# Patient Record
Sex: Female | Born: 1960 | Hispanic: Refuse to answer | Marital: Married | Smoking: Never smoker
Health system: Southern US, Community
[De-identification: ages and names within clinical notes are randomized; demographics above are authoritative.]

## PROBLEM LIST (undated history)

## (undated) ENCOUNTER — Emergency Department (HOSPITAL_COMMUNITY): Payer: BLUE CROSS/BLUE SHIELD

## (undated) DIAGNOSIS — Z862 Personal history of diseases of the blood and blood-forming organs and certain disorders involving the immune mechanism: Secondary | ICD-10-CM

## (undated) DIAGNOSIS — R4189 Other symptoms and signs involving cognitive functions and awareness: Secondary | ICD-10-CM

## (undated) DIAGNOSIS — IMO0002 Reserved for concepts with insufficient information to code with codable children: Secondary | ICD-10-CM

## (undated) DIAGNOSIS — F32A Depression, unspecified: Secondary | ICD-10-CM

## (undated) DIAGNOSIS — C4491 Basal cell carcinoma of skin, unspecified: Secondary | ICD-10-CM

## (undated) DIAGNOSIS — N920 Excessive and frequent menstruation with regular cycle: Secondary | ICD-10-CM

## (undated) DIAGNOSIS — D649 Anemia, unspecified: Secondary | ICD-10-CM

## (undated) DIAGNOSIS — F419 Anxiety disorder, unspecified: Secondary | ICD-10-CM

## (undated) DIAGNOSIS — T7840XA Allergy, unspecified, initial encounter: Secondary | ICD-10-CM

## (undated) DIAGNOSIS — I639 Cerebral infarction, unspecified: Secondary | ICD-10-CM

## (undated) DIAGNOSIS — I2699 Other pulmonary embolism without acute cor pulmonale: Secondary | ICD-10-CM

## (undated) DIAGNOSIS — Z8679 Personal history of other diseases of the circulatory system: Secondary | ICD-10-CM

## (undated) DIAGNOSIS — I10 Essential (primary) hypertension: Secondary | ICD-10-CM

## (undated) DIAGNOSIS — C569 Malignant neoplasm of unspecified ovary: Secondary | ICD-10-CM

## (undated) DIAGNOSIS — F329 Major depressive disorder, single episode, unspecified: Secondary | ICD-10-CM

## (undated) DIAGNOSIS — C439 Malignant melanoma of skin, unspecified: Secondary | ICD-10-CM

## (undated) DIAGNOSIS — G43909 Migraine, unspecified, not intractable, without status migrainosus: Secondary | ICD-10-CM

## (undated) HISTORY — DX: Other symptoms and signs involving cognitive functions and awareness: R41.89

## (undated) HISTORY — DX: Migraine, unspecified, not intractable, without status migrainosus: G43.909

## (undated) HISTORY — DX: Reserved for concepts with insufficient information to code with codable children: IMO0002

## (undated) HISTORY — DX: Basal cell carcinoma of skin, unspecified: C44.91

## (undated) HISTORY — DX: Anxiety disorder, unspecified: F41.9

## (undated) HISTORY — DX: Personal history of diseases of the blood and blood-forming organs and certain disorders involving the immune mechanism: Z86.2

## (undated) HISTORY — DX: Excessive and frequent menstruation with regular cycle: N92.0

## (undated) HISTORY — DX: Depression, unspecified: F32.A

## (undated) HISTORY — DX: Allergy, unspecified, initial encounter: T78.40XA

## (undated) HISTORY — DX: Major depressive disorder, single episode, unspecified: F32.9

## (undated) HISTORY — DX: Anemia, unspecified: D64.9

## (undated) HISTORY — DX: Malignant neoplasm of unspecified ovary: C56.9

---

## 1992-10-06 HISTORY — PX: OTHER SURGICAL HISTORY: SHX169

## 1993-10-06 DIAGNOSIS — C569 Malignant neoplasm of unspecified ovary: Secondary | ICD-10-CM

## 1993-10-06 HISTORY — PX: LAPAROTOMY: SHX154

## 1993-10-06 HISTORY — DX: Malignant neoplasm of unspecified ovary: C56.9

## 2001-11-01 ENCOUNTER — Emergency Department (HOSPITAL_COMMUNITY): Admission: EM | Admit: 2001-11-01 | Discharge: 2001-11-01 | Payer: Self-pay | Admitting: Podiatry

## 2004-01-26 ENCOUNTER — Encounter: Admission: RE | Admit: 2004-01-26 | Discharge: 2004-01-26 | Payer: Self-pay

## 2004-01-26 ENCOUNTER — Encounter (HOSPITAL_COMMUNITY): Admission: RE | Admit: 2004-01-26 | Discharge: 2004-04-25 | Payer: Self-pay

## 2004-07-31 ENCOUNTER — Emergency Department (HOSPITAL_COMMUNITY): Admission: EM | Admit: 2004-07-31 | Discharge: 2004-08-01 | Payer: Self-pay | Admitting: Emergency Medicine

## 2005-10-22 ENCOUNTER — Encounter: Admission: RE | Admit: 2005-10-22 | Discharge: 2006-01-20 | Payer: Self-pay | Admitting: Rheumatology

## 2006-11-05 ENCOUNTER — Encounter: Admission: RE | Admit: 2006-11-05 | Discharge: 2006-11-05 | Payer: Self-pay | Admitting: Family Medicine

## 2007-10-07 DIAGNOSIS — C439 Malignant melanoma of skin, unspecified: Secondary | ICD-10-CM

## 2007-10-07 HISTORY — DX: Malignant melanoma of skin, unspecified: C43.9

## 2007-10-07 HISTORY — PX: MOHS SURGERY: SUR867

## 2007-10-07 HISTORY — PX: BREAST DUCTAL SYSTEM EXCISION: SHX5242

## 2007-10-14 ENCOUNTER — Emergency Department (HOSPITAL_COMMUNITY): Admission: EM | Admit: 2007-10-14 | Discharge: 2007-10-14 | Payer: Self-pay | Admitting: Emergency Medicine

## 2007-11-18 ENCOUNTER — Encounter: Admission: RE | Admit: 2007-11-18 | Discharge: 2007-11-18 | Payer: Self-pay

## 2008-12-19 ENCOUNTER — Encounter: Admission: RE | Admit: 2008-12-19 | Discharge: 2008-12-19 | Payer: Self-pay | Admitting: Family Medicine

## 2009-07-04 ENCOUNTER — Emergency Department (HOSPITAL_BASED_OUTPATIENT_CLINIC_OR_DEPARTMENT_OTHER): Admission: EM | Admit: 2009-07-04 | Discharge: 2009-07-04 | Payer: Self-pay | Admitting: Emergency Medicine

## 2009-07-04 ENCOUNTER — Ambulatory Visit: Payer: Self-pay | Admitting: Diagnostic Radiology

## 2010-10-27 ENCOUNTER — Encounter: Payer: Self-pay | Admitting: Family Medicine

## 2011-11-20 LAB — HM MAMMOGRAPHY: HM Mammogram: NEGATIVE

## 2011-11-26 ENCOUNTER — Emergency Department (HOSPITAL_COMMUNITY)
Admission: EM | Admit: 2011-11-26 | Discharge: 2011-11-27 | Disposition: A | Discharge status: Home / Self Care | Payer: BC Managed Care – PPO | Attending: Emergency Medicine | Admitting: Emergency Medicine

## 2011-11-26 ENCOUNTER — Encounter (HOSPITAL_COMMUNITY): Payer: Self-pay | Admitting: Emergency Medicine

## 2011-11-26 DIAGNOSIS — L0390 Cellulitis, unspecified: Secondary | ICD-10-CM

## 2011-11-26 DIAGNOSIS — R609 Edema, unspecified: Secondary | ICD-10-CM | POA: Insufficient documentation

## 2011-11-26 DIAGNOSIS — C439 Malignant melanoma of skin, unspecified: Secondary | ICD-10-CM | POA: Insufficient documentation

## 2011-11-26 DIAGNOSIS — M7989 Other specified soft tissue disorders: Secondary | ICD-10-CM | POA: Insufficient documentation

## 2011-11-26 DIAGNOSIS — M79609 Pain in unspecified limb: Secondary | ICD-10-CM | POA: Insufficient documentation

## 2011-11-26 DIAGNOSIS — D649 Anemia, unspecified: Secondary | ICD-10-CM | POA: Insufficient documentation

## 2011-11-26 DIAGNOSIS — IMO0002 Reserved for concepts with insufficient information to code with codable children: Secondary | ICD-10-CM | POA: Insufficient documentation

## 2011-11-26 HISTORY — DX: Malignant melanoma of skin, unspecified: C43.9

## 2011-11-26 NOTE — ED Notes (Signed)
Patient complaining of swelling in her left upper arm that started several days ago; patient states that the swelling increased greatly tonight and she was unable to sleep.  Patient able to move all extremities freely; redness and swelling noted to left upper extremity.

## 2011-11-27 ENCOUNTER — Ambulatory Visit (HOSPITAL_COMMUNITY)
Admission: RE | Admit: 2011-11-27 | Discharge: 2011-11-27 | Disposition: A | Discharge status: Home / Self Care | Payer: BC Managed Care – PPO | Source: Ambulatory Visit | Attending: Emergency Medicine | Admitting: Emergency Medicine

## 2011-11-27 ENCOUNTER — Encounter (HOSPITAL_COMMUNITY): Payer: Self-pay

## 2011-11-27 ENCOUNTER — Ambulatory Visit (HOSPITAL_COMMUNITY): Payer: BC Managed Care – PPO

## 2011-11-27 ENCOUNTER — Emergency Department (HOSPITAL_COMMUNITY)
Admission: EM | Admit: 2011-11-27 | Discharge: 2011-11-27 | Disposition: A | Discharge status: Home Health | Payer: BC Managed Care – PPO | Attending: Emergency Medicine | Admitting: Emergency Medicine

## 2011-11-27 DIAGNOSIS — M7989 Other specified soft tissue disorders: Secondary | ICD-10-CM

## 2011-11-27 DIAGNOSIS — M79609 Pain in unspecified limb: Secondary | ICD-10-CM | POA: Insufficient documentation

## 2011-11-27 DIAGNOSIS — I808 Phlebitis and thrombophlebitis of other sites: Secondary | ICD-10-CM

## 2011-11-27 LAB — BASIC METABOLIC PANEL
BUN: 14 mg/dL (ref 6–23)
CO2: 27 mEq/L (ref 19–32)
Calcium: 9.9 mg/dL (ref 8.4–10.5)
Chloride: 101 mEq/L (ref 96–112)
GFR calc non Af Amer: >90 mL/min (ref >90)
Glucose, Bld: 100 mg/dL — ABNORMAL HIGH (ref 70–99)
Potassium: 3.5 mEq/L (ref 3.5–5.1)

## 2011-11-27 LAB — DIFFERENTIAL
Eosinophils Absolute: 0.3 10*3/uL (ref 0.0–0.7)
Lymphocytes Relative: 32 % (ref 12–46)
Lymphs Abs: 3.4 10*3/uL (ref 0.7–4.0)
Monocytes Relative: 7 % (ref 3–12)

## 2011-11-27 LAB — CBC
HCT: 29.8 % — ABNORMAL LOW (ref 36.0–46.0)
MCH: 22.8 pg — ABNORMAL LOW (ref 26.0–34.0)
MCHC: 31.2 g/dL (ref 30.0–36.0)
MCV: 73 fL — ABNORMAL LOW (ref 78.0–100.0)
Platelets: 388 10*3/uL (ref 150–400)
WBC: 10.7 10*3/uL — ABNORMAL HIGH (ref 4.0–10.5)

## 2011-11-27 MED ORDER — AZITHROMYCIN 250 MG PO TABS
500.0000 mg | ORAL_TABLET | Freq: Once | ORAL | Status: AC
  Administered 2011-11-27: 500 mg via ORAL
  Filled 2011-11-27: qty 2

## 2011-11-27 MED ORDER — HYDROCODONE-ACETAMINOPHEN 5-325 MG PO TABS: 1.0000 | ORAL_TABLET | Freq: Four times a day (QID) | ORAL | Status: DC | PRN

## 2011-11-27 MED ORDER — AZITHROMYCIN 250 MG PO TABS: 250.0000 mg | ORAL_TABLET | Freq: Every day | ORAL | Status: DC

## 2011-11-27 NOTE — ED Notes (Signed)
Pt ambulated with a steady gait; VSS; A&Ox3; respirations even and unlabored; skin warm and dry; no questions at this time.

## 2011-11-27 NOTE — Discharge Instructions (Signed)
Phlebitis  Phlebitis is a redness, tenderness and soreness (inflammation) in a vein. This can occur in your arms, legs, or torso (trunk), as well as deeper inside your body.   CAUSES   Phlebitis can be triggered by multiple factors. These include:   Reduced (restricted) blood flow through your veins. This happens with prolonged bed rest, long distance travel, injury or surgery. Being overweight (obese) and pregnant can also restrict blood flow and lead to phlebitis.   Putting a catheter in the vein (intravenous or IV) and giving certain medications through in the vein (intravenously).   Cancer and cancer treatment.   Use of illegal intravenous drugs.   Inflammatory diseases.   Inherited (genetic) diseases that increase the risk for blood clots.   Hormone therapy (such as birth control pills).  SYMPTOMS    Red, tender, swollen, painful area on your skin.   Usually, the area will be long and narrow.   Low grade fever.   Significant firmness along the center of this area. This can indicate that a blood clot has formed.   Surrounding redness or a high fever, which can indicate an infection (cellulitis).  DIAGNOSIS    The appearance of your condition and your symptoms will cause your caregiver to suspect phlebitis. Usually, this is enough for a diagnosis.   Your caregiver may request blood tests or an ultrasound test of the area to be sure you do not have an infection or a blood clot. Blood tests and discussing your family history may also indicate if you have an underlying genetic disease that causes blood clots.   Occasionally, a piece of tissue is taken from the body (biopsy) if an unusual cause of phlebitis is suspected.  TREATMENT    Raise (elevate) the affected area above the level of the heart.   Apply a warm compress or heating pad for 20 minutes, 3 or 4 times a day. If you use an electric heating pad, follow the directions so you do not burn yourself.   Anti-inflammatory medications are usually  recommended. Follow your caregiver's directions.   Any IV catheter, if present, will be removed by your caregiver.   Your caregiver may prescribe medicines that kill germs (antibiotics) if an infection is present.   Your caregiver may recommend blood thinners if a blood clot is suspected or present.   Support stockings or bandages may be helpful, depending on the cause and location of the phlebitis.   Surgery may be needed to remove very damaged sections of vein, but this is rare.  HOME CARE INSTRUCTIONS    Take medications exactly as prescribed.   Follow up with your caregiver as directed.   Use support stockings or bandages if advised. These will speed healing and prevent recurrence.   If you are on blood thinners:   Do follow-up blood tests exactly as directed.   Check with your caregiver before using any new medications.   Wear a pendant to show that you are on blood thinners.   For phlebitis in the legs:   Avoid prolonged standing or bed rest.   Keep your legs moving. Raise your legs with sitting or lying.   Do not smoke.   Women, particularly those over the age of 35, should consider the risks and benefits of taking the contraceptive pill. This kind of hormone treatment can increase your risk for blood clots.  SEEK MEDICAL CARE IF:    You have unusual bruising or any bleeding problems.   Swelling   not gradually improving.   You are on anti-inflammatory medication and you develop belly (abdominal) pain.  SEEK IMMEDIATE MEDICAL CARE IF:   An unexplained oral temperature above 100.5 F (38.1 C) develops.   You have sudden onset of chest pain or difficulty breathing.  Document Released: 09/16/2001 Document Revised: 06/04/2011 Document Reviewed: 06/18/2009 [facility] Patient Information 2012 [place], [place].

## 2011-11-27 NOTE — Progress Notes (Signed)
Left upper extremity venous duplex completed.  Preliminary report is negative for DVT.  There appears to be superficial thrombus in the left basilic vein.

## 2011-11-27 NOTE — ED Provider Notes (Signed)
Medical screening examination/treatment/procedure(s) were performed by non-physician practitioner and as supervising physician I was immediately available for consultation/collaboration.  Azucena Dart P Mikiah Demond, MD 11/27/11 1612 

## 2011-11-27 NOTE — Discharge Instructions (Signed)
The vascular office should call you to schedule the [place] this am.  Call if you do not receive a call from them.  Take the medications as prescribed.  Cellulitis Cellulitis is an infection of the tissue under the skin. The infected area is usually red and tender. This is caused by germs. These germs enter the body through cuts or sores. This usually happens in the arms or lower legs. HOME CARE   Take your medicine as told. Finish it even if you start to feel better.   If the infection is on the arm or leg, keep it raised (elevated).   Use a warm cloth on the infected area several times a day.   See your doctor for a follow-up visit as told.  GET HELP RIGHT AWAY IF:   You are tired or confused.   You throw up (vomit).   You have watery poop (diarrhea).   You feel ill and have muscle aches.   You have a fever.  MAKE SURE YOU:   Understand these instructions.   Will watch your condition.   Will get help right away if you are not doing well or get worse.  Document Released: 03/10/2008 Document Revised: 06/04/2011 Document Reviewed: 08/24/2009 [facility] Patient Information 2012 [place], [place].Cellulitis Cellulitis is an infection of the tissue under the skin. The infected area is usually red and tender. This is caused by germs. These germs enter the body through cuts or sores. This usually happens in the arms or lower legs. HOME CARE   Take your medicine as told. Finish it even if you start to feel better.   If the infection is on the arm or leg, keep it raised (elevated).   Use a warm cloth on the infected area several times a day.   See your doctor for a follow-up visit as told.  GET HELP RIGHT AWAY IF:   You are tired or confused.   You throw up (vomit).   You have watery poop (diarrhea).   You feel ill and have muscle aches.   You have a fever.  MAKE SURE YOU:   Understand these instructions.   Will watch your condition.   Will get help right away if you are  not doing well or get worse.  Document Released: 03/10/2008 Document Revised: 06/04/2011 Document Reviewed: 08/24/2009 [facility] Patient Information 2012 [place], [place].

## 2011-11-27 NOTE — ED Notes (Signed)
Pt was seen last night for left forearm pain. She returned this morning for vascular study and was told that she did have a clot in her left arm. Pt was sent over for medication dosage and treatment.

## 2011-11-27 NOTE — ED Provider Notes (Signed)
History     CSN: [id number]  Arrival date & time 11/27/11  1144   First MD Initiated Contact with Patient 11/27/11 1222      Chief Complaint  Patient presents with  . Arm Pain    (Consider location/radiation/quality/duration/timing/severity/associated sxs/prior treatment) HPI  51 year old female presenting to the ED with a chief complaints of left forearm pain and swelling, and a vascular study showing a superficial thrombus but without evidence of DVT.  Patient states she was told to come to the ED from the vascular study area. Right now she denies fever, chest pain, shortness of breath. She denies any recent trauma. She denies numbness or weakness.  Past Medical History  Diagnosis Date  . Melanoma     Past Surgical History  Procedure Date  . Cesarean section     History reviewed. No pertinent family history.  History  Substance Use Topics  . Smoking status: Never Smoker   . Smokeless tobacco: Not on file  . Alcohol Use: No    OB History    Grav Para Term Preterm Abortions TAB SAB Ect Mult Living                  Review of Systems  All other systems reviewed and are negative.    Allergies  Penicillins  Home Medications   Current Outpatient Rx  Name Route Sig Dispense Refill  . AZITHROMYCIN 250 MG PO TABS Oral Take 250 mg by mouth daily.    Marland Kitchen CLONAZEPAM PO Oral Take 1 mg by mouth every morning.     Marland Kitchen LAMICTAL PO Oral Take 50 mg by mouth every morning.     . ADULT MULTIVITAMIN W/MINERALS CH Oral Take 2 tablets by mouth daily. Juice plus gummies    . SERTRALINE HCL PO Oral Take 100 mg by mouth every morning.     Marland Kitchen HYDROCODONE-ACETAMINOPHEN 5-325 MG PO TABS Oral Take 1 tablet by mouth every 6 (six) hours as needed.      BP 124/67  Pulse 70  Temp(Src) 98 F (36.7 C) (Oral)  Resp 20  SpO2 100%  LMP 11/24/2011  Physical Exam  Nursing note and vitals reviewed. Constitutional: She appears well-developed and well-nourished.  HENT:  Head: Atraumatic.    Neck: Normal range of motion. Neck supple.  Cardiovascular: Normal rate.   Pulmonary/Chest: Effort normal. No respiratory distress. She exhibits no tenderness.  Abdominal: Soft.  Musculoskeletal: Normal range of motion.  Skin:       ED Course  Procedures (including critical care time)  Labs Reviewed - No data to display No results found.   No diagnosis found.  Results for orders placed during the hospital encounter of 11/26/11  CBC      Component Value Range   WBC 10.7 (*) 4.0 - 10.5 (K/uL)   RBC 4.08  3.87 - 5.11 (MIL/uL)   Hemoglobin 9.3 (*) 12.0 - 15.0 (g/dL)   HCT 46.9 (*) 62.9 - 46.0 (%)   MCV 73.0 (*) 78.0 - 100.0 (fL)   MCH 22.8 (*) 26.0 - 34.0 (pg)   MCHC 31.2  30.0 - 36.0 (g/dL)   RDW 52.8 (*) 41.3 - 15.5 (%)   Platelets 388  150 - 400 (K/uL)  DIFFERENTIAL      Component Value Range   Neutrophils Relative 59  43 - 77 (%)   Neutro Abs 6.3  1.7 - 7.7 (K/uL)   Lymphocytes Relative 32  12 - 46 (%)   Lymphs Abs 3.4  0.7 -  4.0 (K/uL)   Monocytes Relative 7  3 - 12 (%)   Monocytes Absolute 0.7  0.1 - 1.0 (K/uL)   Eosinophils Relative 2  0 - 5 (%)   Eosinophils Absolute 0.3  0.0 - 0.7 (K/uL)   Basophils Relative 1  0 - 1 (%)   Basophils Absolute 0.1  0.0 - 0.1 (K/uL)  BASIC METABOLIC PANEL      Component Value Range   Sodium 137  135 - 145 (mEq/L)   Potassium 3.5  3.5 - 5.1 (mEq/L)   Chloride 101  96 - 112 (mEq/L)   CO2 27  19 - 32 (mEq/L)   Glucose, Bld 100 (*) 70 - 99 (mg/dL)   BUN 14  6 - 23 (mg/dL)   Creatinine, Ser 1.61  0.50 - 1.10 (mg/dL)   Calcium 9.9  8.4 - 09.6 (mg/dL)   GFR calc non Af Amer >90  >90 (mL/min)   GFR calc Af Amer >90  >90 (mL/min)   No results found.  Patient Information       Patient Name  Sex  DOB  SSN    Kimberly Monroe, Kimberly Monroe  Female  12/14/1960  [id number]             Progress Notes signed by Smiley Houseman at 11/27/11 1147     Author:  Smiley Houseman  Service:  (none)  Author Type:  Technician   Filed:  11/27/11 1147   Note Time:  11/27/11 1146          Left upper extremity venous duplex completed. Preliminary report is negative for DVT. There appears to be superficial thrombus in the left basilic vein.     MDM  Superficial thrombophlebitis. No evidence of DVT. No evidence of chest pain shortness of breath. Patient is in no acute distress. Patient has received pain medication. I gave patient instructions for care, including using warm compress. I instructed the patient to follow up with the primary care Dr. or return to the ED in 3 days to assure resolution of the symptoms. Patient voiced understanding and agreed with plan.        Fayrene Helper, PA-C 11/27/11 1241

## 2011-11-27 NOTE — ED Notes (Signed)
Pt reports arm on Monday felt "roppy" like a rope was in her arm. PT reports it felt hot and started gettting bigger. Spot is warm to touch; PT reports concern from DVT in arm and no wound or bite noted. PT reports vein is bigger and more prominent than usual

## 2011-11-27 NOTE — ED Provider Notes (Addendum)
History     CSN: [id number]  Arrival date & time 11/26/11  2258   First MD Initiated Contact with Patient 11/27/11 0124      Chief Complaint  Patient presents with  . Cellulitis    (Consider location/radiation/quality/duration/timing/severity/associated sxs/prior treatment) HPI Pt has noticed swelling in her left arm for several days.  She has not had any trauma or recent medical injections, blood draws in that arm.  The area has become increasingly red and painful.  No prior history of same.  No fevers.   No history of blood clots. No CP, or SOB Past Medical History  Diagnosis Date  . Melanoma     Past Surgical History  Procedure Date  . Cesarean section     History reviewed. No pertinent family history.  History  Substance Use Topics  . Smoking status: Never Smoker   . Smokeless tobacco: Not on file  . Alcohol Use: No    OB History    Grav Para Term Preterm Abortions TAB SAB Ect Mult Living                  Review of Systems  All other systems reviewed and are negative.    Allergies  Penicillins  Home Medications   Current Outpatient Rx  Name Route Sig Dispense Refill  . CLONAZEPAM PO Oral Take 1 mg by mouth every morning.     Marland Kitchen LAMICTAL PO Oral Take 50 mg by mouth every morning.     Marland Kitchen SERTRALINE HCL PO Oral Take 100 mg by mouth every morning.       BP 144/91  Pulse 94  Temp(Src) 98.3 F (36.8 C) (Oral)  Resp 20  SpO2 96%  LMP 11/24/2011  Physical Exam  Nursing note and vitals reviewed. Constitutional: She appears well-developed and well-nourished. No distress.  HENT:  Head: Normocephalic and atraumatic.  Right Ear: External ear normal.  Left Ear: External ear normal.  Eyes: Conjunctivae are normal. Right eye exhibits no discharge. Left eye exhibits no discharge. No scleral icterus.  Neck: Neck supple. No tracheal deviation present.  Cardiovascular: Normal rate, regular rhythm and intact distal pulses.   Pulmonary/Chest: Effort normal  and breath sounds normal. No stridor. No respiratory distress. She has no wheezes. She has no rales.  Abdominal: Soft. Bowel sounds are normal. She exhibits no distension. There is no tenderness. There is no rebound and no guarding.  Musculoskeletal: She exhibits edema and tenderness.       Arms:      Erythema, and area of induration and swelling  Neurological: She is alert. She has normal strength. No sensory deficit. Cranial nerve deficit:  no gross defecits noted. She exhibits normal muscle tone. She displays no seizure activity. Coordination normal.  Skin: Skin is warm and dry. No rash noted.  Psychiatric: She has a normal mood and affect.    ED Course  Procedures (including critical care time)  Labs Reviewed  CBC - Abnormal; Notable for the following:    WBC 10.7 (*)    Hemoglobin 9.3 (*)    HCT 29.8 (*)    MCV 73.0 (*)    MCH 22.8 (*)    RDW 17.1 (*)    All other components within normal limits  BASIC METABOLIC PANEL - Abnormal; Notable for the following:    Glucose, Bld 100 (*)    All other components within normal limits  DIFFERENTIAL   No results found.   1. Cellulitis  MDM  Her symptoms are most suspicious for an early cellulitis versus a superficial thrombophlebitis. Patient has not had any recent injuries that would predispose her towards a deep venous thrombosis. She has not had any central venous catheter. Patient be placed on antibiotics and pain medications. I have ordered for her to have a outpatient vascular study to rule out dvt        Celene Kras, MD 11/27/11 0305  Addendum. I did discuss that the patient has anemia and that outpatient follow up is warranted. She has not noticed any bleeding. She has no known history of anemia. Vision is asymptomatic.  Celene Kras, MD 11/27/11 [id number]

## 2011-12-17 ENCOUNTER — Encounter: Payer: Self-pay | Admitting: Family Medicine

## 2011-12-17 ENCOUNTER — Ambulatory Visit (INDEPENDENT_AMBULATORY_CARE_PROVIDER_SITE_OTHER): Payer: BC Managed Care – PPO | Admitting: Family Medicine

## 2011-12-17 VITALS — BP 138/82 | HR 72 | Temp 98.3°F | Resp 12 | Ht 64.5 in | Wt 219.0 lb

## 2011-12-17 DIAGNOSIS — D509 Iron deficiency anemia, unspecified: Secondary | ICD-10-CM | POA: Insufficient documentation

## 2011-12-17 DIAGNOSIS — F329 Major depressive disorder, single episode, unspecified: Secondary | ICD-10-CM | POA: Insufficient documentation

## 2011-12-17 DIAGNOSIS — Z800 Family history of malignant neoplasm of digestive organs: Secondary | ICD-10-CM

## 2011-12-17 DIAGNOSIS — Z85828 Personal history of other malignant neoplasm of skin: Secondary | ICD-10-CM | POA: Insufficient documentation

## 2011-12-17 DIAGNOSIS — F32A Depression, unspecified: Secondary | ICD-10-CM | POA: Insufficient documentation

## 2011-12-17 DIAGNOSIS — I82890 Acute embolism and thrombosis of other specified veins: Secondary | ICD-10-CM

## 2011-12-17 LAB — CBC WITH DIFFERENTIAL/PLATELET
Basophils Absolute: 0 10*3/uL (ref 0.0–0.1)
Basophils Relative: 0.5 % (ref 0.0–3.0)
Eosinophils Absolute: 0.1 10*3/uL (ref 0.0–0.7)
Lymphocytes Relative: 27.2 % (ref 12.0–46.0)
Lymphs Abs: 2.3 10*3/uL (ref 0.7–4.0)
MCHC: 31.3 g/dL (ref 30.0–36.0)
MCV: 73.2 fl — ABNORMAL LOW (ref 78.0–100.0)
Monocytes Absolute: 0.5 10*3/uL (ref 0.1–1.0)
Platelets: 318 10*3/uL (ref 150.0–400.0)
RDW: 18.3 % — ABNORMAL HIGH (ref 11.5–14.6)

## 2011-12-17 NOTE — Progress Notes (Signed)
Subjective:    Patient ID: Kimberly Monroe, female    DOB: 1961-06-23, 51 y.o.   MRN: [id number]  HPI  New to establish care. Patient recently had some left arm pain. Initially diagnosed as cellulitis in ED. Subsequently had scan which showed a superficial thrombus left basilic vein.  No progressive symptoms since then. She used heat. Patient had basic metabolic panel at hospitals which was unremarkable. CBC was significant for hemoglobin 9.3 with MCV of 73. No prior history of anemia. Patient is probably perimenopausal with some recent heavy menses. She's had mild dizziness. No consistent orthostatic symptoms. Denies abdominal pain. No stool changes. No hematemesis. No history of screening colonoscopy yet.  She's not taking iron replacement. Her labs above were drawn on 11/27/2011.  Patient history depression followed by psychiatry. Medications as reviewed. She's had multiple skin cancers including melanoma, basal cell, and squamous cell. Followed every 3 months by dermatology.  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  Left breast surgery secondary to intraductal papilloma.  Family history significant for father colon cancer in his 48s.  Past Medical History  Diagnosis Date  . Melanoma   . Depression   . History of blood clotting disorder    Past Surgical History  Procedure Date  . Cesarean section   . Breast surgery 2009    L intraductal papilloma  . 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 Mohs surgery     reports that she has never smoked. She does not have any smokeless tobacco history on file. She reports that she does not drink alcohol or use illicit drugs. family history includes Cancer in her paternal uncle and Cancer (age of onset:70) in her father. Allergies  Allergen Reactions  . Penicillins Other (See Comments)    Possibility-patient has never taken it      Review of Systems  Constitutional: Positive for fatigue.  Negative for fever, activity change and appetite change.  HENT: Negative for hearing loss, ear pain, sore throat and trouble swallowing.   Eyes: Negative for visual disturbance.  Respiratory: Positive for shortness of breath. Negative for cough.   Cardiovascular: Negative for chest pain and palpitations.  Gastrointestinal: Negative for vomiting, abdominal pain, diarrhea, constipation and blood in stool.  Genitourinary: Negative for dysuria, hematuria and pelvic pain.  Musculoskeletal: Negative for myalgias, back pain and arthralgias.  Skin: Negative for rash.  Neurological: Positive for light-headedness. Negative for headaches.  Hematological: Negative for adenopathy.  Psychiatric/Behavioral: Negative for confusion and dysphoric mood.       Objective:   Physical Exam  Constitutional: She is oriented to person, place, and time. She appears well-developed and well-nourished.  HENT:  Mouth/Throat: Oropharynx is clear and moist.  Neck: Neck supple. No thyromegaly present.  Cardiovascular: Normal rate and regular rhythm.   Pulmonary/Chest: Effort normal and breath sounds normal. No respiratory distress. She has no wheezes. She has no rales.  Abdominal: Soft. Bowel sounds are normal. She exhibits no mass. There is no tenderness. There is no rebound and no guarding.  Musculoskeletal: She exhibits no edema.  Lymphadenopathy:    She has no cervical adenopathy.  Neurological: She is alert and oriented to person, place, and time. No cranial nerve deficit.  Skin: No rash noted.          Assessment & Plan:  #1 anemia. Microcytic. Question related to menstrual loss. Given her family history need to rule out GI loss as well. Hemoccults given. Further labs with repeat  CBC, serum iron, and ferritin. Schedule complete physical and colonoscopy screening will be scheduled. #2 superficial thrombus left arm. Clinically stable. No hx of DVT. #3 history of multiple skin cancers  #4 probably  perimenopausal with recent heavy menses. Consider GYN followup if persists #5 history of depression, recurrent. Followed by psychiatry

## 2011-12-17 NOTE — Patient Instructions (Addendum)
Start iron sulfate 325 mg one daily.   Complete hemoccult cards.  Iron-Rich Diet An iron-rich diet contains foods that are good sources of iron. Iron is an important mineral that helps your body produce hemoglobin. Hemoglobin is a protein in red blood cells that carries oxygen to the body's tissues. Sometimes, the iron level in your blood can be low. This may be caused by:  A lack of iron in your diet.   Blood loss.   Times of growth, such as during pregnancy or during a child's growth and development.  Low levels of iron can cause a decrease in the number of red blood cells. This can result in iron deficiency anemia. Iron deficiency anemia symptoms include:  Tiredness.   Weakness.   Irritability.   Increased chance of infection.  Here are some recommendations for daily iron intake:  Males older than 51 years of age need 8 mg of iron per day.   Women ages 11 to 55 need 18 mg of iron per day.   Pregnant women need 27 mg of iron per day, and women who are over 58 years of age and breastfeeding need 9 mg of iron per day.   Women over the age of 35 need 8 mg of iron per day.  SOURCES OF IRON There are 2 types of iron that are found in food: heme iron and nonheme iron. Heme iron is absorbed by the body better than nonheme iron. Heme iron is found in meat, poultry, and fish. Nonheme iron is found in grains, beans, and vegetables. Heme Iron Sources Food / Iron (mg)  Chicken liver, 3 oz (85 g)/ 10 mg   Beef liver, 3 oz (85 g)/ 5.5 mg   Oysters, 3 oz (85 g)/ 8 mg   Beef, 3 oz (85 g)/ 2 to 3 mg   Shrimp, 3 oz (85 g)/ 2.8 mg   [place], 3 oz (85 g)/ 2 mg   Chicken, 3 oz (85 g) / 1 mg   Fish (tuna, halibut), 3 oz (85 g)/ 1 mg   Pork, 3 oz (85 g)/ 0.9 mg  Nonheme Iron Sources Food / Iron (mg)  Ready-to-eat breakfast cereal, iron-fortified / 3.9 to 7 mg   Tofu,  cup / 3.4 mg   Kidney beans,  cup / 2.6 mg   Baked potato with skin / 2.7 mg   Asparagus,  cup / 2.2 mg    Avocado / 2 mg   Dried peaches,  cup / 1.6 mg   Raisins,  cup / 1.5 mg   Soy milk, 1 cup / 1.5 mg   Whole-wheat bread, 1 slice / 1.2 mg   Spinach, 1 cup / 0.8 mg   Broccoli,  cup / 0.6 mg  IRON ABSORPTION Certain foods can decrease the body's absorption of iron. Try to avoid these foods and beverages while eating meals with iron-containing foods:  Coffee.   Tea.   Fiber.   Soy.  Foods containing vitamin C can help increase the amount of iron your body absorbs from iron sources, especially from nonheme sources. Eat foods with vitamin C along with iron-containing foods to increase your iron absorption. Foods that are high in vitamin C include many fruits and vegetables. Some good sources are:  Fresh orange juice.   Oranges.   Strawberries.   Mangoes.   Grapefruit.   Red bell peppers.   Green bell peppers.   Broccoli.   Potatoes with skin.   Tomato juice.  Document  Released: 05/06/2005 Document Revised: 09/11/2011 Document Reviewed: 03/13/2011 [facility] Patient Information 2012 [place], [place].

## 2011-12-18 NOTE — Progress Notes (Signed)
Quick Note:  Pt informed on personally identified VM ______ 

## 2011-12-19 ENCOUNTER — Encounter: Payer: Self-pay | Admitting: Gastroenterology

## 2011-12-25 ENCOUNTER — Ambulatory Visit (INDEPENDENT_AMBULATORY_CARE_PROVIDER_SITE_OTHER): Payer: BC Managed Care – PPO | Admitting: Family Medicine

## 2011-12-25 ENCOUNTER — Encounter: Payer: Self-pay | Admitting: Family Medicine

## 2011-12-25 VITALS — BP 120/70 | HR 64 | Temp 97.7°F | Resp 12 | Wt 219.0 lb

## 2011-12-25 DIAGNOSIS — N920 Excessive and frequent menstruation with regular cycle: Secondary | ICD-10-CM

## 2011-12-25 DIAGNOSIS — R42 Dizziness and giddiness: Secondary | ICD-10-CM

## 2011-12-25 DIAGNOSIS — D509 Iron deficiency anemia, unspecified: Secondary | ICD-10-CM

## 2011-12-25 LAB — CBC WITH DIFFERENTIAL/PLATELET
Basophils Absolute: 0.1 10*3/uL (ref 0.0–0.1)
Basophils Relative: 0.9 % (ref 0.0–3.0)
Eosinophils Relative: 2.2 % (ref 0.0–5.0)
Hemoglobin: 9.7 g/dL — ABNORMAL LOW (ref 12.0–15.0)
Lymphocytes Relative: 29.9 % (ref 12.0–46.0)
Lymphs Abs: 2.3 10*3/uL (ref 0.7–4.0)
MCHC: 31.3 g/dL (ref 30.0–36.0)
MCV: 73.4 fl — ABNORMAL LOW (ref 78.0–100.0)
Monocytes Absolute: 0.5 10*3/uL (ref 0.1–1.0)
Monocytes Relative: 7.2 % (ref 3.0–12.0)
Neutro Abs: 4.5 10*3/uL (ref 1.4–7.7)

## 2011-12-25 NOTE — Progress Notes (Signed)
  Subjective:    Patient ID: Kimberly Monroe, female    DOB: 22-May-1961, 51 y.o.   MRN: [id number]  HPI  Patient is for followup. She seen acutely today with increased dizziness. Refer to prior note. Confirmed iron deficiency anemia. Probably related to perimenopausal menometrorrhagia. She has some vaginal bleeding at this time. She does have family history of colon cancer and has never been screened and has GI appointment scheduled. She has not had any abdominal pain or any melena or bloody stools or any type of stool change. Denies any nausea or vomiting. No diarrhea. She has lightheadedness with standing but no syncope. Denies any chest pains.  Past Medical History  Diagnosis Date  . Melanoma   . Depression   . History of blood clotting disorder    Past Surgical History  Procedure Date  . Cesarean section   . Breast surgery 2009    L intraductal papilloma  . 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 Mohs surgery     reports that she has never smoked. She does not have any smokeless tobacco history on file. She reports that she does not drink alcohol or use illicit drugs. family history includes Cancer in her paternal uncle and Cancer (age of onset:70) in her father. Allergies  Allergen Reactions  . Penicillins Other (See Comments)    Possibility-patient has never taken it      Review of Systems  Constitutional: Positive for fatigue. Negative for appetite change.  Cardiovascular: Negative for chest pain.  Gastrointestinal: Negative for nausea, vomiting, abdominal pain and blood in stool.  Genitourinary: Positive for vaginal bleeding. Negative for dysuria and vaginal pain.  Neurological: Positive for dizziness and weakness. Negative for syncope.  Hematological: Negative for adenopathy.       Objective:   Physical Exam  Constitutional: She is oriented to person, place, and time. She appears well-developed and well-nourished.  Neck: Neck supple.  Cardiovascular: Normal  rate and regular rhythm.   Pulmonary/Chest: Effort normal and breath sounds normal. No respiratory distress. She has no wheezes. She has no rales.  Abdominal: Soft. There is no tenderness.  Musculoskeletal: She exhibits no edema.  Lymphadenopathy:    She has no cervical adenopathy.  Neurological: She is alert and oriented to person, place, and time. No cranial nerve deficit.          Assessment & Plan:  Dizziness. Probably related to her anemia though she does not have any major orthostatic change from lying to sitting. Lying down 110/60 and sitting 120/70.  Pt able to ambulate without assistance.  Iron deficiency anemia. Probably related to perimenopausal bleeding. Patient on iron supplement. Recheck CBC today. GYN referral. Patient has appointment for screening colonoscopy soon. No significant risks for upper GI bleed

## 2011-12-26 NOTE — Progress Notes (Signed)
Quick Note:  Pt informed ______ 

## 2012-01-15 ENCOUNTER — Ambulatory Visit (AMBULATORY_SURGERY_CENTER): Payer: BC Managed Care – PPO | Admitting: *Deleted

## 2012-01-15 VITALS — Ht 64.5 in | Wt 216.0 lb

## 2012-01-15 DIAGNOSIS — D509 Iron deficiency anemia, unspecified: Secondary | ICD-10-CM

## 2012-01-15 DIAGNOSIS — Z1211 Encounter for screening for malignant neoplasm of colon: Secondary | ICD-10-CM

## 2012-01-15 MED ORDER — PEG-KCL-NACL-NASULF-NA ASC-C 100 G PO SOLR: ORAL | Status: DC

## 2012-01-16 ENCOUNTER — Encounter: Payer: BC Managed Care – PPO | Admitting: Family Medicine

## 2012-01-16 ENCOUNTER — Telehealth: Payer: Self-pay | Admitting: *Deleted

## 2012-01-16 DIAGNOSIS — Z0289 Encounter for other administrative examinations: Secondary | ICD-10-CM

## 2012-01-16 NOTE — Telephone Encounter (Signed)
Message left on pt VM that she did not show for her CPX today, she had her labs done and everything, asked her to call back with exolaination and re-schedule

## 2012-01-29 ENCOUNTER — Ambulatory Visit (AMBULATORY_SURGERY_CENTER): Payer: BC Managed Care – PPO | Admitting: Gastroenterology

## 2012-01-29 ENCOUNTER — Encounter: Payer: Self-pay | Admitting: Gastroenterology

## 2012-01-29 VITALS — BP 121/75 | HR 70 | Temp 97.9°F | Resp 17 | Ht 64.5 in | Wt 216.0 lb

## 2012-01-29 DIAGNOSIS — Z1211 Encounter for screening for malignant neoplasm of colon: Secondary | ICD-10-CM

## 2012-01-29 DIAGNOSIS — Z800 Family history of malignant neoplasm of digestive organs: Secondary | ICD-10-CM

## 2012-01-29 MED ORDER — SODIUM CHLORIDE 0.9 % IV SOLN: 500.0000 mL | INTRAVENOUS | Status: DC

## 2012-01-29 NOTE — Patient Instructions (Signed)
YOU HAD AN ENDOSCOPIC PROCEDURE TODAY AT THE [facility]: Refer to the procedure report that was given to you for any specific questions about what was found during the examination.  If the procedure report does not answer your questions, please call your gastroenterologist to clarify.  If you requested that your care partner not be given the details of your procedure findings, then the procedure report has been included in a sealed envelope for you to review at your convenience later.  YOU SHOULD EXPECT: Some feelings of bloating in the abdomen. Passage of more gas than usual.  Walking can help get rid of the air that was put into your GI tract during the procedure and reduce the bloating. If you had a lower endoscopy (such as a colonoscopy or flexible sigmoidoscopy) you may notice spotting of blood in your stool or on the toilet paper. If you underwent a bowel prep for your procedure, then you may not have a normal bowel movement for a few days.  DIET: Your first meal following the procedure should be a light meal and then it is ok to progress to your normal diet.  A half-sandwich or bowl of soup is an example of a good first meal.  Heavy or fried foods are harder to digest and may make you feel nauseous or bloated.  Likewise meals heavy in dairy and vegetables can cause extra gas to form and this can also increase the bloating.  Drink plenty of fluids but you should avoid alcoholic beverages for 24 hours.  ACTIVITY: Your care partner should take you home directly after the procedure.  You should plan to take it easy, moving slowly for the rest of the day.  You can resume normal activity the day after the procedure however you should NOT DRIVE or use heavy machinery for 24 hours (because of the sedation medicines used during the test).    SYMPTOMS TO REPORT IMMEDIATELY: A gastroenterologist can be reached at any hour.  During normal business hours, 8:30 AM to 5:00 PM Monday through Friday,  call [phone].  After hours and on weekends, please call the GI answering service at [phone] who will take a message and have the physician on call contact you.   Following lower endoscopy (colonoscopy or flexible sigmoidoscopy):  Excessive amounts of blood in the stool  Significant tenderness or worsening of abdominal pains  Swelling of the abdomen that is new, acute  Fever of 100F or higher    FOLLOW UP: If any biopsies were taken you will be contacted by phone or by letter within the next 1-3 weeks.  Call your gastroenterologist if you have not heard about the biopsies in 3 weeks.  Our staff will call the home number listed on your records the next business day following your procedure to check on you and address any questions or concerns that you may have at that time regarding the information given to you following your procedure. This is a courtesy call and so if there is no answer at the home number and we have not heard from you through the emergency physician on call, we will assume that you have returned to your regular daily activities without incident.  SIGNATURES/CONFIDENTIALITY: You and/or your care partner have signed paperwork which will be entered into your electronic medical record.  These signatures attest to the fact that that the information above on your After Visit Summary has been reviewed and is understood.  Full responsibility of the confidentiality   of this discharge information lies with you and/or your care-partner.     

## 2012-01-29 NOTE — Op Note (Signed)
[facility] [address] [place], [place]  [address]  COLONOSCOPY PROCEDURE REPORT  PATIENT:  Kimberly Monroe, Kimberly Monroe  MR#:  [id number] BIRTHDATE:  05/11/1961, 51 yrs. old  GENDER:  female ENDOSCOPIST:  Vania Rea. Jarold Motto, MD, [facility] REF. BY:  Evelena Peat, M.D. PROCEDURE DATE:  01/29/2012 PROCEDURE:  Colonoscopy 19147 ASA CLASS:  Class III INDICATIONS:  Iron deficiency anemia, family history of colon cancer FATHER AT AGE 10 ?? MEDICATIONS:   propofol (Diprivan) 150 mg IV  DESCRIPTION OF PROCEDURE:   After the risks and benefits and of the procedure were explained, informed consent was obtained. Digital rectal exam was performed and revealed no abnormalities. The LB CF-[id number] endoscope was introduced through the anus and advanced to the cecum, which was identified by both the appendix and ileocecal valve.  The quality of the prep was excellent, using MoviPrep.  The instrument was then slowly withdrawn as the colon was fully examined. <<PROCEDUREIMAGES>>  FINDINGS:  No polyps or cancers were seen.  This was otherwise a normal examination of the colon.   Retroflexed views in the rectum revealed no abnormalities.    The scope was then withdrawn from the patient and the procedure completed.  COMPLICATIONS:  None ENDOSCOPIC IMPRESSION: 1) No polyps or cancers 2) Otherwise normal examination RECOMMENDATIONS: 1) Given your significant family history of colon cancer, you should have a repeat colonoscopy in 5 years  REPEAT EXAM:  No  ______________________________ Vania Rea. Jarold Motto, MD, [facility]  CC:  n. eSIGNED:   Vania Rea. Saudia Smyser at 01/29/2012 11:09 AM  Ginnie Smart, [phone]

## 2012-01-29 NOTE — Progress Notes (Signed)
Patient did not have preoperative order for IV antibiotic SSI prophylaxis. (G8918) Patient did not have preoperative order for IV antibiotic SSI prophylaxis. (G8918)  

## 2012-01-30 ENCOUNTER — Telehealth: Payer: Self-pay | Admitting: *Deleted

## 2012-01-30 NOTE — Telephone Encounter (Signed)
No answer left message to call office for questions or concerns. 

## 2012-02-02 ENCOUNTER — Emergency Department (INDEPENDENT_AMBULATORY_CARE_PROVIDER_SITE_OTHER): Payer: BC Managed Care – PPO

## 2012-02-02 ENCOUNTER — Inpatient Hospital Stay (HOSPITAL_BASED_OUTPATIENT_CLINIC_OR_DEPARTMENT_OTHER)
Admission: EM | Admit: 2012-02-02 | Discharge: 2012-02-06 | DRG: 541 | Disposition: A | Discharge status: Home / Self Care | Payer: BC Managed Care – PPO | Attending: Family Medicine | Admitting: Family Medicine

## 2012-02-02 ENCOUNTER — Encounter (HOSPITAL_BASED_OUTPATIENT_CLINIC_OR_DEPARTMENT_OTHER): Payer: Self-pay | Admitting: *Deleted

## 2012-02-02 DIAGNOSIS — R079 Chest pain, unspecified: Secondary | ICD-10-CM

## 2012-02-02 DIAGNOSIS — R748 Abnormal levels of other serum enzymes: Secondary | ICD-10-CM | POA: Diagnosis present

## 2012-02-02 DIAGNOSIS — J9601 Acute respiratory failure with hypoxia: Secondary | ICD-10-CM

## 2012-02-02 DIAGNOSIS — Z7901 Long term (current) use of anticoagulants: Secondary | ICD-10-CM

## 2012-02-02 DIAGNOSIS — E876 Hypokalemia: Secondary | ICD-10-CM | POA: Diagnosis present

## 2012-02-02 DIAGNOSIS — J9600 Acute respiratory failure, unspecified whether with hypoxia or hypercapnia: Secondary | ICD-10-CM | POA: Diagnosis present

## 2012-02-02 DIAGNOSIS — I2699 Other pulmonary embolism without acute cor pulmonale: Secondary | ICD-10-CM

## 2012-02-02 DIAGNOSIS — Z85828 Personal history of other malignant neoplasm of skin: Secondary | ICD-10-CM

## 2012-02-02 DIAGNOSIS — F329 Major depressive disorder, single episode, unspecified: Secondary | ICD-10-CM | POA: Diagnosis present

## 2012-02-02 DIAGNOSIS — R918 Other nonspecific abnormal finding of lung field: Secondary | ICD-10-CM

## 2012-02-02 DIAGNOSIS — R0902 Hypoxemia: Secondary | ICD-10-CM | POA: Diagnosis present

## 2012-02-02 DIAGNOSIS — Z832 Family history of diseases of the blood and blood-forming organs and certain disorders involving the immune mechanism: Secondary | ICD-10-CM

## 2012-02-02 DIAGNOSIS — C439 Malignant melanoma of skin, unspecified: Secondary | ICD-10-CM

## 2012-02-02 DIAGNOSIS — Z8249 Family history of ischemic heart disease and other diseases of the circulatory system: Secondary | ICD-10-CM

## 2012-02-02 DIAGNOSIS — R0602 Shortness of breath: Secondary | ICD-10-CM

## 2012-02-02 DIAGNOSIS — D500 Iron deficiency anemia secondary to blood loss (chronic): Secondary | ICD-10-CM | POA: Diagnosis present

## 2012-02-02 DIAGNOSIS — Z853 Personal history of malignant neoplasm of breast: Secondary | ICD-10-CM

## 2012-02-02 DIAGNOSIS — R55 Syncope and collapse: Secondary | ICD-10-CM

## 2012-02-02 DIAGNOSIS — F341 Dysthymic disorder: Secondary | ICD-10-CM | POA: Diagnosis present

## 2012-02-02 DIAGNOSIS — E871 Hypo-osmolality and hyponatremia: Secondary | ICD-10-CM | POA: Diagnosis present

## 2012-02-02 DIAGNOSIS — N920 Excessive and frequent menstruation with regular cycle: Secondary | ICD-10-CM | POA: Diagnosis present

## 2012-02-02 DIAGNOSIS — D509 Iron deficiency anemia, unspecified: Secondary | ICD-10-CM

## 2012-02-02 DIAGNOSIS — Z862 Personal history of diseases of the blood and blood-forming organs and certain disorders involving the immune mechanism: Secondary | ICD-10-CM

## 2012-02-02 DIAGNOSIS — Z8543 Personal history of malignant neoplasm of ovary: Secondary | ICD-10-CM

## 2012-02-02 LAB — CBC
HCT: 30.6 % — ABNORMAL LOW (ref 36.0–46.0)
Platelets: 345 10*3/uL (ref 150–400)
RBC: 4.43 MIL/uL (ref 3.87–5.11)
WBC: 11.9 10*3/uL — ABNORMAL HIGH (ref 4.0–10.5)

## 2012-02-02 LAB — BASIC METABOLIC PANEL
CO2: 23 mEq/L (ref 19–32)
Chloride: 102 mEq/L (ref 96–112)
GFR calc non Af Amer: 85 mL/min — ABNORMAL LOW (ref >90)
Potassium: 3.5 mEq/L (ref 3.5–5.1)
Sodium: 137 mEq/L (ref 135–145)

## 2012-02-02 MED ORDER — ASPIRIN 81 MG PO CHEW
324.0000 mg | CHEWABLE_TABLET | Freq: Once | ORAL | Status: AC
  Administered 2012-02-02: 324 mg via ORAL
  Filled 2012-02-02: qty 4

## 2012-02-02 MED ORDER — MORPHINE SULFATE 4 MG/ML IJ SOLN
4.0000 mg | Freq: Once | INTRAMUSCULAR | Status: AC
  Administered 2012-02-02: 4 mg via INTRAVENOUS
  Filled 2012-02-02: qty 1

## 2012-02-02 MED ORDER — IOHEXOL 350 MG/ML SOLN: 80.0000 mL | Freq: Once | INTRAVENOUS | Status: AC | PRN

## 2012-02-02 NOTE — ED Provider Notes (Addendum)
History   This chart was scribed for Forbes Cellar, MD by Melba Coon. The patient was seen in room MH03/MH03 and the patient's care was started at 10:17PM.    CSN: [id number]  Arrival date & time 02/02/12  2146   First MD Initiated Contact with Patient 02/02/12 2208      Chief Complaint  Patient presents with  . Chest Pain    (Consider location/radiation/quality/duration/timing/severity/associated sxs/prior treatment) HPI Kimberly Monroe is a 51 y.o. female who presents to the Emergency Department complaining of intermittent, moderate to severe non-radiating chest pain with an onset this morning that has been getting progressively worse.   H/o ovarian cancer s/p oopherectomy, breast ca s/p local resection, pw chest pain. No previous Hx of CP. Pt rates the pain 6/10 at its worse and 4/10 at its best. Deep inhalation and sitting up in the bed aggravates the pain. Dull in nature. Pt took a trip to [place] and back 2 weeks ago 6 hours each way; no personal or family hx of blood clots. She does have h/o thrombophlebitis.  LOC present at the store this morning at 9AM; pt states that eyes started rolling and collapsed in a chair; fall was witnessed by her daughter. No BHT. Pt feels weaker than baseline at time of exam. No sick individuals at home. SOB present (fingernails have been turning blue) with exertion. +Lightheadedness. Denies orthopnea/B. No HA, fever, neck pain, sore throat, rash, back pain, abd pain, n/v/d, dysuria, or extremity pain, edema, numbness, or tingling. Hx of ovarian, breast CA and melanoma, but never had to had radiation; states she was lucky. No Hx of heart problems. Mother had heart attack, but pt said it was radiation-induced. No blood thinners taken. No known allergies. No other pertinent medical symptoms. Non-smoker.  Denies h/o HTN,HLD, DM, nonsmoker. Fmhx CAD mother 37-54yo (thought to be related to her chemotherapy per pt)  PCP: Dr. Caryl Never- Leb   Merian Capron, RN 02/02/2012 21:54  C/o chest pain that started today and episodes of her fingers turning blue. Voices history of low rbc and iron  Past Medical History  Diagnosis Date  . Depression   . History of blood clotting disorder   . Anemia   . Anxiety   . Allergy     seasonal  . Melanoma 2009    insitu  . Basal cell cancer   . Squamous cell carcinoma     Past Surgical History  Procedure Date  . Cesarean section   . Breast surgery 2009    L intraductal papilloma  . 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 Mohs surgery     Family History  Problem Relation Age of Onset  . Cancer Father 67    colon cancer  . Colon cancer Father   . Cancer Paternal Uncle     prostate    History  Substance Use Topics  . Smoking status: Never Smoker   . Smokeless tobacco: Never Used  . Alcohol Use: No    OB History    Grav Para Term Preterm Abortions TAB SAB Ect Mult Living                  Review of Systems 10 Systems reviewed and all are negative for acute change except as noted in the HPI.   Allergies  Penicillins  Home Medications   Current Outpatient Rx  Name Route Sig Dispense Refill  . CLONAZEPAM PO Oral Take 1 mg by  mouth every morning.     Marland Kitchen IRON-VITAMINS PO Oral Take 1 tablet by mouth daily.    Marland Kitchen LAMICTAL PO Oral Take 50 mg by mouth every morning.     . ADULT MULTIVITAMIN W/MINERALS CH Oral Take 2 tablets by mouth daily. Juice plus gummies    . PEG-KCL-NACL-NASULF-NA ASC-C 100 G PO SOLR Oral Take 1 kit by mouth once. Patient used this medication for her colonoscopy.    . SERTRALINE HCL PO Oral Take 100 mg by mouth every morning.       BP 105/78  Pulse 104  Temp 99.5 F (37.5 C)  Resp 18  Wt 205 lb (92.987 kg)  SpO2 95%  LMP 01/26/2012  Physical Exam  Nursing note and vitals reviewed. Constitutional: She is oriented to person, place, and time. She appears well-developed and well-nourished.       Awake, alert, nontoxic appearance.  HENT:    Head: Normocephalic and atraumatic.  Eyes: EOM are normal. Pupils are equal, round, and reactive to light. Right eye exhibits no discharge. Left eye exhibits no discharge.  Neck: Normal range of motion. Neck supple.  Cardiovascular: Normal rate, regular rhythm and normal heart sounds.   No murmur heard. Pulmonary/Chest: Effort normal. She has no wheezes. She has no rales. She exhibits no tenderness.  Abdominal: Soft. There is no tenderness. There is no rebound.  Musculoskeletal: She exhibits no edema and no tenderness.       Baseline ROM, no obvious new focal weakness.  Neurological: She is alert and oriented to person, place, and time.       Mental status and motor strength appears baseline for patient and situation.  Skin: Skin is warm. No rash noted.  Psychiatric: She has a normal mood and affect. Her behavior is normal.    Date: 02/02/2012  Rate: 106  Rhythm: sinus tachycardia  QRS Axis: normal  Intervals: normal  ST/T Wave abnormalities: ST depressions inferiorly < 1mm, lateral < 1mm, t wave inv  Conduction Disutrbances:none  Narrative Interpretation:   Old EKG Reviewed: none available   ED Course  Procedures (including critical care time)  DIAGNOSTIC STUDIES: Oxygen Saturation is 95% on room air, adequate by my interpretation.    COORDINATION OF CARE:  10:27PM - EDMD alerts pt that EKG was not nml. Tachycardic. Need to check for blood clot in lungs with chest CT.  CRITICAL CARE Performed by: Forbes Cellar   Total critical care time:  Critical care time was exclusive of separately billable procedures and treating other patients.  Critical care was necessary to treat or prevent imminent or life-threatening deterioration.  Critical care was time spent personally by me on the following activities: development of treatment plan with patient and/or surrogate as well as nursing, discussions with consultants, evaluation of patient's response to treatment,  examination of patient, obtaining history from patient or surrogate, ordering and performing treatments and interventions, ordering and review of laboratory studies, ordering and review of radiographic studies, pulse oximetry and re-evaluation of patient's condition.    Labs Reviewed  CBC - Abnormal; Notable for the following:    WBC 11.9 (*)    Hemoglobin 9.8 (*)    HCT 30.6 (*)    MCV 69.1 (*)    MCH 22.1 (*)    RDW 17.6 (*)    All other components within normal limits  BASIC METABOLIC PANEL - Abnormal; Notable for the following:    GFR calc non Af Amer 85 (*)    All other  components within normal limits  TROPONIN I - Abnormal; Notable for the following:    Troponin I 0.96 (*)    All other components within normal limits   Dg Chest 2 View  02/02/2012  *RADIOLOGY REPORT*  Clinical Data: Chest pain, shortness of breath  CHEST - 2 VIEW  Comparison: 11/30/2009  Findings: Mild lingular and left lower lobe opacity, may be exaggerated by prominent epicardial fat. Heart size upper normal. No pleural effusion or pneumothorax.  No acute osseous abnormality.  IMPRESSION: Mild lingular/left lower lobe opacity, likely scarring or atelectasis.  Early infiltrate not excluded in the appropriate clinical setting.  Original Report Authenticated By: Waneta Martins, M.D.     1. Syncope and collapse   2. Chest pain   3. Pulmonary embolism.    MDM  50yoF pw syncope, chest pain. DDx include PE (RF age, recent trip), ACS, pleurisy, less likely PNA, dissection, ptx, esophageal spasm. EKG with min ST depression inf/lat leads, non spec t wave inversions. Patient given ASA, morphine. CXR with possible pna/LLL opacity but patient without fever/chills/cough and lungs CTAB. CTA chest pending. Even if negative anticipate admission for formal rule out given syncope and min ekg changes.  12:11 AM  Pt with elevated trop. ZERO chest pain at this time after morphine, asa. CTA pending. DDx NSTEMI v/s PE with heart  strain. Pt signed out to Dr. Bebe Shaggy pending CTA results.  I personally performed the services described in this documentation, which was scribed in my presence. The recorded information has been reviewed and considered.  1218 AM D/W Radiology. Pt with large b/l PE. Discussed with Dr. Bebe Shaggy who will admit to Stepdown v/s ICU.  BP 105/78  Pulse 104  Temp 99.5 F (37.5 C)  Resp 18  Wt 205 lb (92.987 kg)  SpO2 95%  LMP 01/26/2012  Forbes Cellar, MD 02/03/12 1610  Forbes Cellar, MD 02/03/12 [phone]

## 2012-02-02 NOTE — ED Notes (Signed)
Patient transported to X-ray 

## 2012-02-02 NOTE — ED Notes (Signed)
C/o chest pain that started today and episodes of her fingers turning blue. Voices history of low rbc and iron

## 2012-02-03 LAB — LIPID PANEL
HDL: 33 mg/dL — ABNORMAL LOW (ref >39)
LDL Cholesterol: 78 mg/dL (ref 0–99)
Triglycerides: 103 mg/dL (ref <150)
VLDL: 21 mg/dL (ref 0–40)

## 2012-02-03 LAB — HEPARIN LEVEL (UNFRACTIONATED): Heparin Unfractionated: 0.29 IU/mL — ABNORMAL LOW (ref 0.30–0.70)

## 2012-02-03 LAB — APTT: aPTT: 35 seconds (ref 24–37)

## 2012-02-03 LAB — CARDIAC PANEL(CRET KIN+CKTOT+MB+TROPI)
CK, MB: 2.6 ng/mL (ref 0.3–4.0)
Relative Index: INVALID (ref 0.0–2.5)
Total CK: 50 U/L (ref 7–177)
Total CK: 42 U/L (ref 7–177)
Troponin I: 0.68 ng/mL (ref <0.30)

## 2012-02-03 LAB — TROPONIN I: Troponin I: 0.96 ng/mL (ref <0.30)

## 2012-02-03 LAB — PROTIME-INR: Prothrombin Time: 14.5 seconds (ref 11.6–15.2)

## 2012-02-03 MED ORDER — ACETAMINOPHEN 650 MG RE SUPP: 650.0000 mg | Freq: Four times a day (QID) | RECTAL | Status: DC | PRN

## 2012-02-03 MED ORDER — SODIUM CHLORIDE 0.9 % IJ SOLN
3.0000 mL | Freq: Two times a day (BID) | INTRAMUSCULAR | Status: DC
  Administered 2012-02-04: 3 mL via INTRAVENOUS

## 2012-02-03 MED ORDER — HEPARIN (PORCINE) IN NACL 100-0.45 UNIT/ML-% IJ SOLN
1550.0000 [IU]/h | INTRAMUSCULAR | Status: DC
  Administered 2012-02-04 – 2012-02-05 (×2): 1550 [IU]/h via INTRAVENOUS
  Filled 2012-02-03 (×6): qty 250

## 2012-02-03 MED ORDER — HYDROCODONE-ACETAMINOPHEN 5-325 MG PO TABS: 1.0000 | ORAL_TABLET | ORAL | Status: DC | PRN

## 2012-02-03 MED ORDER — CLONAZEPAM 1 MG PO TABS
1.0000 mg | ORAL_TABLET | Freq: Every day | ORAL | Status: DC
  Administered 2012-02-03 – 2012-02-06 (×4): 1 mg via ORAL
  Filled 2012-02-03 (×5): qty 1

## 2012-02-03 MED ORDER — WARFARIN SODIUM 7.5 MG PO TABS
7.5000 mg | ORAL_TABLET | Freq: Once | ORAL | Status: AC
  Administered 2012-02-03: 7.5 mg via ORAL
  Filled 2012-02-03: qty 1

## 2012-02-03 MED ORDER — LAMOTRIGINE 25 MG PO TABS
50.0000 mg | ORAL_TABLET | Freq: Every day | ORAL | Status: DC
  Administered 2012-02-03 – 2012-02-06 (×4): 50 mg via ORAL
  Filled 2012-02-03 (×4): qty 2

## 2012-02-03 MED ORDER — ALBUTEROL SULFATE (5 MG/ML) 0.5% IN NEBU: 2.5000 mg | INHALATION_SOLUTION | RESPIRATORY_TRACT | Status: DC | PRN

## 2012-02-03 MED ORDER — ACETAMINOPHEN 325 MG PO TABS
650.0000 mg | ORAL_TABLET | Freq: Four times a day (QID) | ORAL | Status: DC | PRN
  Filled 2012-02-03: qty 2

## 2012-02-03 MED ORDER — ONDANSETRON HCL 4 MG PO TABS: 4.0000 mg | ORAL_TABLET | Freq: Four times a day (QID) | ORAL | Status: DC | PRN

## 2012-02-03 MED ORDER — ONDANSETRON HCL 4 MG/2ML IJ SOLN: 4.0000 mg | Freq: Four times a day (QID) | INTRAMUSCULAR | Status: DC | PRN

## 2012-02-03 MED ORDER — COUMADIN BOOK
Freq: Once | Status: AC
  Administered 2012-02-03: 12:00:00
  Filled 2012-02-03: qty 1

## 2012-02-03 MED ORDER — SERTRALINE HCL 100 MG PO TABS
100.0000 mg | ORAL_TABLET | Freq: Every day | ORAL | Status: DC
  Administered 2012-02-03 – 2012-02-06 (×4): 100 mg via ORAL
  Filled 2012-02-03 (×4): qty 1

## 2012-02-03 MED ORDER — SODIUM CHLORIDE 0.9 % IV SOLN: 250.0000 mL | INTRAVENOUS | Status: DC | PRN

## 2012-02-03 MED ORDER — WARFARIN - PHARMACIST DOSING INPATIENT: Freq: Every day | Status: DC

## 2012-02-03 MED ORDER — ASPIRIN EC 81 MG PO TBEC
81.0000 mg | DELAYED_RELEASE_TABLET | Freq: Every day | ORAL | Status: DC
  Administered 2012-02-03 – 2012-02-04 (×2): 81 mg via ORAL
  Filled 2012-02-03 (×2): qty 1

## 2012-02-03 MED ORDER — IOHEXOL 350 MG/ML SOLN
80.0000 mL | Freq: Once | INTRAVENOUS | Status: AC | PRN
  Administered 2012-02-03: 80 mL via INTRAVENOUS

## 2012-02-03 MED ORDER — HEPARIN BOLUS VIA INFUSION
5000.0000 [IU] | Freq: Once | INTRAVENOUS | Status: AC
  Administered 2012-02-03: 5000 [IU] via INTRAVENOUS

## 2012-02-03 MED ORDER — HEPARIN (PORCINE) IN NACL 100-0.45 UNIT/ML-% IJ SOLN
1300.0000 [IU]/h | INTRAMUSCULAR | Status: DC
  Administered 2012-02-03: 1300 [IU]/h via INTRAVENOUS
  Filled 2012-02-03 (×2): qty 250

## 2012-02-03 MED ORDER — MORPHINE SULFATE 2 MG/ML IJ SOLN
2.0000 mg | Freq: Once | INTRAMUSCULAR | Status: AC
  Administered 2012-02-03: 2 mg via INTRAVENOUS
  Filled 2012-02-03: qty 1

## 2012-02-03 MED ORDER — MORPHINE SULFATE 2 MG/ML IJ SOLN: 1.0000 mg | INTRAMUSCULAR | Status: DC | PRN

## 2012-02-03 MED ORDER — SODIUM CHLORIDE 0.9 % IV SOLN: INTRAVENOUS | Status: DC

## 2012-02-03 MED ORDER — ADULT MULTIVITAMIN W/MINERALS CH
2.0000 | ORAL_TABLET | Freq: Every day | ORAL | Status: DC
  Administered 2012-02-03 – 2012-02-04 (×2): 2 via ORAL
  Administered 2012-02-05: 1 via ORAL
  Filled 2012-02-03 (×3): qty 2

## 2012-02-03 MED ORDER — ALUM & MAG HYDROXIDE-SIMETH 200-200-20 MG/5ML PO SUSP: 30.0000 mL | Freq: Four times a day (QID) | ORAL | Status: DC | PRN

## 2012-02-03 MED ORDER — SODIUM CHLORIDE 0.9 % IJ SOLN: 3.0000 mL | INTRAMUSCULAR | Status: DC | PRN

## 2012-02-03 MED ORDER — WARFARIN VIDEO
Freq: Once | Status: AC
  Administered 2012-02-03: 12:00:00

## 2012-02-03 NOTE — ED Provider Notes (Signed)
BP 105/78  Pulse 104  Temp 99.5 F (37.5 C)  Resp 18  Ht 5' 4.5" (1.638 m)  Wt 205 lb (92.987 kg)  BMI 34.64 kg/m2  SpO2 95%  LMP 01/26/2012 Pt signed out to me call report She has large PE but she is awake/alert, O2 sats appropriate No pain at this time Will admit to stepdown to triad Started heparin, and I also spoke to critical care (deterding) who is aware of patient but would not recommend anything further than heparin/stepdown   Joya Gaskins, MD 02/03/12 [phone]

## 2012-02-03 NOTE — Progress Notes (Signed)
ANTICOAGULATION CONSULT NOTE - Initial Consult  Pharmacy Consult for heparin Indication: pulmonary embolus  Allergies  Allergen Reactions  . Penicillins Other (See Comments)    Possibility-patient has never taken it    Patient Measurements: Height: 5' 4.5" (163.8 cm) Weight: 205 lb (92.987 kg) IBW/kg (Calculated) : 55.85  Heparin Dosing Weight: 76kg  Vital Signs: Temp: 99.5 F (37.5 C) (04/29 2154) BP: 105/78 mmHg (04/29 2154) Pulse Rate: 104  (04/29 2154)  Labs:  Basename 02/03/12 0014 02/02/12 2305  HGB -- 9.8*  HCT -- 30.6*  PLT -- 345  APTT 35 --  LABPROT 14.5 --  INR 1.11 --  HEPARINUNFRC -- --  CREATININE -- 0.80  CKTOTAL -- --  CKMB -- --  TROPONINI -- 0.96*   Estimated Creatinine Clearance: 93.9 ml/min (by C-G formula based on Cr of 0.8).  Medical History: Past Medical History  Diagnosis Date  . Depression   . History of blood clotting disorder   . Anemia   . Anxiety   . Allergy     seasonal  . Melanoma 2009    insitu  . Basal cell cancer   . Squamous cell carcinoma     Assessment: 51yo female with h/o ovarian/breast Ca c/o intermittent non-radiating CP aggravated with deep inhalation and prone position, found with PE on CT, to begin heparin.  Goal of Therapy:  Heparin level 0.3-0.7 units/ml   Plan:  Will give heparin 4000 units IV bolus x1 followed by gtt at 1300 units/hr and monitor heparin levels and CBC.  Colleen Can PharmD BCPS 02/03/2012,12:41 AM

## 2012-02-03 NOTE — Progress Notes (Signed)
Received pt from 2300, pt is a/ox3, VSS, denies any pain or need. Pt oriented to room and call light. family at bedside visiting with pt. Call light at reach and bed to lowest position  And will cont to monitor.

## 2012-02-03 NOTE — H&P (Signed)
PATIENT DETAILS Name: Kimberly Monroe Age: 51 y.o. 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 Per patient last Friday, she noted she started getting short of breath-mainly while ambulating. Over the weekend, she noticed that her nail beds were blue in color. She then started having retrosternal chest discomfort as well. Her shortness of breath worsened, and as a result she was brought to the hospital for further evaluation and treatment. She was found to have a large PE on a CT Angiogram of the chest. During my evaluation she was completely chest pain free, and after removing her O2 for around 5 minutes she did not go below 93-94 percent. She is also not tachypneic on exam, and looks very comfortable, without using her accessory muscles of respiration. She denies oral contraceptive use, but has been over the past month-travelled by road to [place] and [place]. 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 She has had numerous skin tumors in the past   ALLERGIES:   Allergies  Allergen Reactions  . Penicillins Other (See Comments)    Possibility-patient has never taken it    PAST MEDICAL HISTORY: Past Medical History  Diagnosis Date  . Depression   . History of blood clotting disorder   . Anemia   . Anxiety   . Allergy     seasonal  . Melanoma 2009    insitu  . Basal cell cancer   . Squamous cell carcinoma     PAST SURGICAL HISTORY: Past Surgical History  Procedure Date  . Cesarean section   .  Breast surgery 2009    L intraductal papilloma  . 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 Mohs surgery     MEDICATIONS AT HOME: Prior to Admission medications   Medication Sig Start Date End Date Taking? Authorizing Provider  CLONAZEPAM PO Take 1 mg by mouth every morning.    Yes Historical Provider, MD  IRON-VITAMINS PO Take 1 tablet by mouth daily.   Yes Historical Provider, MD  LamoTRIgine (LAMICTAL PO) Take 50 mg by mouth every morning.    Yes Historical Provider, MD  Multiple Vitamin (MULITIVITAMIN WITH MINERALS) TABS Take 2 tablets by mouth daily. Juice plus gummies   Yes Historical Provider, MD  peg 3350 powder (MOVIPREP) 100 G SOLR Take 1 kit by mouth once. Patient used this medication for her colonoscopy.   Yes Historical Provider, MD  SERTRALINE HCL PO Take 100 mg by mouth every morning.    Yes Historical Provider, MD    FAMILY HISTORY: Family History  Problem Relation Age of Onset  . Cancer Father 1    colon cancer  . Colon cancer Father   . Cancer Paternal Uncle     prostate    SOCIAL HISTORY:  reports that she has never smoked. She has never used smokeless tobacco. She reports that she does not drink alcohol or use illicit drugs.  REVIEW OF SYSTEMS:  Constitutional:   No  weight loss, night sweats,  Fevers, chills, fatigue.  HEENT:  No headaches, Difficulty swallowing,Tooth/dental problems,Sore throat,  No sneezing, itching, ear ache, nasal congestion, post nasal drip,   Cardio-vascular:  swelling in lower extremities, anasarca,dizziness, palpitations  GI:  No heartburn, indigestion, abdominal pain, nausea, vomiting, diarrhea, change in  bowel habits, loss of appetite  Resp: No shortness of breath with exertion or at rest.  No excess mucus, no productive cough, No non-productive cough,  No coughing up of blood.No change in color of mucus.No wheezing.No chest wall deformity  Skin:  no rash or lesions.  GU:  no dysuria, change in color of urine,  no urgency or frequency.  No flank pain.  Musculoskeletal: No joint pain or swelling.  No decreased range of motion.  No back pain.  Psych: No change in mood or affect. No depression or anxiety.  No memory loss.   PHYSICAL EXAM: Blood pressure 99/72, pulse 76, temperature 98.9 F (37.2 C), temperature source Oral, resp. rate 21, height 5\' 4"  (1.626 m), weight 92.987 kg (205 lb), last menstrual period 01/26/2012, SpO2 96.00%.  General appearance :Awake, alert, not in any distress. Speech Clear. Not toxic Looking HEENT: Atraumatic and Normocephalic, pupils equally reactive to light and accomodation Neck: supple, no JVD. No cervical lymphadenopathy.  Chest:Good air entry bilaterally, no added sounds  CVS: S1 S2 regular, no murmurs.  Abdomen: Bowel sounds present, Non tender and not distended with no gaurding, rigidity or rebound. Extremities: B/L Lower Ext shows no edema, both legs are warm to touch, with  dorsalis pedis pulses palpable. Neurology: Awake alert, and oriented X 3, CN II-XII intact, Non focal,  Skin:No Rash Wounds:N/A  LABS ON ADMISSION:   Basename 02/02/12 2305  NA 137  K 3.5  CL 102  CO2 23  GLUCOSE 98  BUN 8  CREATININE 0.80  CALCIUM 9.4  MG --  PHOS --   No results found for this basename: AST:2,ALT:2,ALKPHOS:2,BILITOT:2,PROT:2,ALBUMIN:2 in the last 72 hours No results found for this basename: LIPASE:2,AMYLASE:2 in the last 72 hours  Basename 02/02/12 2305  WBC 11.9*  NEUTROABS --  HGB 9.8*  HCT 30.6*  MCV 69.1*  PLT 345    Basename 02/03/12 0904 02/02/12 2305  CKTOTAL 50 --  CKMB 2.6 --  CKMBINDEX -- --  TROPONINI 0.68* 0.96*   No results found for this basename: DDIMER:2 in the last 72 hours No components found with this basename: POCBNP:3   RADIOLOGIC STUDIES ON ADMISSION: Dg Chest 2 View  02/02/2012  *RADIOLOGY REPORT*  Clinical Data: Chest pain, shortness of breath  CHEST - 2 VIEW  Comparison: 11/30/2009  Findings: Mild lingular and  left lower lobe opacity, may be exaggerated by prominent epicardial fat. Heart size upper normal. No pleural effusion or pneumothorax.  No acute osseous abnormality.  IMPRESSION: Mild lingular/left lower lobe opacity, likely scarring or atelectasis.  Early infiltrate not excluded in the appropriate clinical setting.  Original Report Authenticated By: Waneta Martins, M.D.   Ct Angio Chest W/cm &/or Wo Cm  02/03/2012  *RADIOLOGY REPORT*  Clinical Data: Shortness of breath, history of squamous cell carcinoma and ovarian cancer, evaluate for pulmonary embolism  CT ANGIOGRAPHY CHEST  Technique:  Multidetector CT imaging of the chest using the standard protocol during bolus administration of intravenous contrast. Multiplanar reconstructed images including MIPs were obtained and reviewed to evaluate the vascular anatomy.  Contrast: 80mL OMNIPAQUE IOHEXOL 350 MG/ML SOLN  Comparison: Chest radiograph - earlier same day  Findings:  There is adequate opacification of the pulmonary vasculature within main pulmonary artery measuring  327 HU.  There is extensive pulmonary embolism seen within the right main pulmonary artery and extending to all of the major segmental branch vessels of the right lung.  Pulmonary embolism is seen within all the major segmental branches involving the left lung.  No embolism is seen within the main pulmonary artery.  The caliber of the main pulmonary artery is normal.  There is no CT evidence of right-sided heart strain.  There is no reflux of ingested contrast into the hepatic venous system.  Borderline cardiomegaly.  No pericardial effusion.  Normal caliber of the thoracic aorta.  Normal configuration of the thoracic aortic arch.  Visualized portions of these cervical vasculature are patent.  The descending thoracic aorta is of normal caliber.  No thoracic aortic dissection.  No periaortic stranding.  Nonvascular findings:  There is minimal bibasilar dependent ground-glass opacities due to  atelectasis.  No focal airspace opacity.  No pleural effusion or pneumothorax.  Central airways are patent. No definite mediastinal, hilar or axillary lymphadenopathy.  Limited early arterial phase evaluation of the upper abdomen is normal.  No acute or aggressive osseous abnormalities.  IMPRESSION: Positive for pulmonary embolism with clot seen within all major segmental branches of both pulmonary arteries.  There is no CT evidence of right-sided heart strain though further evaluation may be obtained with cardiac echo as clinically indicated.  Above findings discussed with Dr. Hyman Hopes at [phone].  Original Report Authenticated By: Waynard Reeds, M.D.    ASSESSMENT AND PLAN: Present on Admission:  Pulmonary Embolism -Not sure whether this is related to her recent Left upper ext Sup Thrombosis, she does have a h/o (>6hrs) road travel to [facility] and [place]. No other obvious provoking factors, claims cancer screening is uptodate -In any event will continue to monitor her in the SDU for another 24 hours -Continue with Heparin gtt and start coumadin -will check a 2 D echo for RV strain-if positive will consult PCCM  Elevated Troponins -Likely false positive elevation from underlying PE -will obtain a 2 D Echo-if any evidence of wall motion of abnormality will consult cards -cyclc enzymes and follow trend.  H/o Anemia -microcytic-but recent colonoscopy negative -monitor H/H while on anticoagulation  Depression/anxiety -continue with Sertraline and Klonopin  Further plan will depend as patient's clinical course evolves and further radiologic and laboratory data become available. Patient will be monitored closely.  DVT Prophylaxis: On heparin gtt  Code Status: Full Code  Total time spent for admission equals 45 minutes.  Jeoffrey Massed 02/03/2012, 3:55 PM

## 2012-02-03 NOTE — Progress Notes (Signed)
ANTICOAGULATION CONSULT NOTE - Follow Up Consult  Pharmacy Consult for Heparin and Coumadin Indication: pulmonary embolus  Allergies  Allergen Reactions  . Penicillins Other (See Comments)    Possibility-patient has never taken it    Patient Measurements: Height: 5\' 4"  (162.6 cm) Weight: 205 lb (92.987 kg) IBW/kg (Calculated) : 54.7  Heparin Dosing Weight: 76 kg  Vital Signs: Temp: 98.2 F (36.8 C) (04/30 0751) Temp src: Axillary (04/30 0751) BP: 102/65 mmHg (04/30 0800) Pulse Rate: 78  (04/30 0900)  Labs:  Basename 02/03/12 0904 02/03/12 0014 02/02/12 2305  HGB -- -- 9.8*  HCT -- -- 30.6*  PLT -- -- 345  APTT -- 35 --  LABPROT -- 14.5 --  INR -- 1.11 --  HEPARINUNFRC 0.32 -- --  CREATININE -- -- 0.80  CKTOTAL -- -- --  CKMB -- -- --  TROPONINI -- -- 0.96*   Estimated Creatinine Clearance: 93 ml/min (by C-G formula based on Cr of 0.8).   Medications:  Scheduled:    . aspirin  324 mg Oral Once  . aspirin EC  81 mg Oral Daily  . clonazePAM  1 mg Oral Daily  . heparin  5,000 Units Intravenous Once  . lamoTRIgine  50 mg Oral Daily  .  morphine injection  2 mg Intravenous Once  .  morphine injection  4 mg Intravenous Once  . mulitivitamin with minerals  2 tablet Oral Daily  . sertraline  100 mg Oral Daily  . sodium chloride  3 mL Intravenous Q12H  . sodium chloride  3 mL Intravenous Q12H   Infusions:    . DISCONTD: sodium chloride 10 mL/hr at 02/03/12 0800  . DISCONTD: heparin 1,300 Units/hr (02/03/12 0800)    Assessment: 51yo female with h/o ovarian/breast ca c/o intermittent non-radiating CP aggravated with deep inhalation and prone position, found with PE on CT 4/29.  Heparin at low end of therapeutic goal on 1300 units/hr.  Will increase infusion rate to target mid range goal.  Also to start Coumadin today.  Baseline INR=1.11.  Today will be day #1/5 mimimum overlap required for VTE core measures.  Goal of Therapy:  INR 2-3 Heparin level 0.3-0.7  units/ml   Plan:  Increase heparin infusion to 1400 units/hr. Heparin level in 6 hours to confirm therapeutic. Continue heparin level and CBC daily. Coumadin 7.5 mg PO x 1 tonight. Daily INR. Coumadin education materials ordered.   [employer], Pharm.D., BCPS Clinical Pharmacist Pager [phone] 02/03/2012 10:50 AM

## 2012-02-03 NOTE — Progress Notes (Signed)
[facility] paged to obtain admission orders

## 2012-02-03 NOTE — Progress Notes (Signed)
*  PRELIMINARY RESULTS* Echocardiogram 2D Echocardiogram has been performed.  Glean Salen [facility] 02/03/2012, 12:30 PM

## 2012-02-03 NOTE — Progress Notes (Signed)
ANTICOAGULATION CONSULT NOTE - Follow Up Consult  Pharmacy Consult for Heparin and Coumadin Indication: pulmonary embolus  Allergies  Allergen Reactions  . Penicillins Other (See Comments)    Possibility-patient has never taken it    Patient Measurements: Height: 5\' 4"  (162.6 cm) Weight: 205 lb (92.987 kg) IBW/kg (Calculated) : 54.7  Heparin Dosing Weight: 76 kg  Vital Signs: Temp: 98.4 F (36.9 C) (04/30 1600) Temp src: Oral (04/30 1128) BP: 112/59 mmHg (04/30 1600) Pulse Rate: 78  (04/30 1800)  Labs:  Basename 02/03/12 1659 02/03/12 0904 02/03/12 0014 02/02/12 2305  HGB -- -- -- 9.8*  HCT -- -- -- 30.6*  PLT -- -- -- 345  APTT -- -- 35 --  LABPROT -- -- 14.5 --  INR -- -- 1.11 --  HEPARINUNFRC 0.29* 0.32 -- --  CREATININE -- -- -- 0.80  CKTOTAL 42 50 -- --  CKMB 2.4 2.6 -- --  TROPONINI 0.55* 0.68* -- 0.96*   Estimated Creatinine Clearance: 93 ml/min (by C-G formula based on Cr of 0.8).   Medications:  Scheduled:     . aspirin  324 mg Oral Once  . aspirin EC  81 mg Oral Daily  . clonazePAM  1 mg Oral Daily  . coumadin book   Does not apply Once  . heparin  5,000 Units Intravenous Once  . lamoTRIgine  50 mg Oral Daily  .  morphine injection  2 mg Intravenous Once  .  morphine injection  4 mg Intravenous Once  . mulitivitamin with minerals  2 tablet Oral Daily  . sertraline  100 mg Oral Daily  . sodium chloride  3 mL Intravenous Q12H  . sodium chloride  3 mL Intravenous Q12H  . warfarin  7.5 mg Oral ONCE-1800  . warfarin   Does not apply Once  . Warfarin - Pharmacist Dosing Inpatient   Does not apply q1800   Infusions:     . heparin 1,400 Units/hr (02/03/12 1800)  . DISCONTD: sodium chloride 10 mL/hr at 02/03/12 0800  . DISCONTD: heparin 1,300 Units/hr (02/03/12 0800)    Assessment: 51yo female with h/o ovarian/breast ca c/o intermittent non-radiating CP aggravated with deep inhalation and prone position, found with PE on CT 4/29.  Heparin now  slightly subtherapeutic (0.29) on 1400 units/hr.  Per RN no infusion issues.  Goal of Therapy:  Heparin level 0.3-0.7   Plan:  Increase heparin infusion to 1550 units/hr. Continue heparin level and CBC daily.  Rolland Porter, Pharm.D., BCPS Clinical Pharmacist Pager: [phone] 02/03/2012 6:54 PM

## 2012-02-04 DIAGNOSIS — E876 Hypokalemia: Secondary | ICD-10-CM | POA: Diagnosis present

## 2012-02-04 DIAGNOSIS — I2699 Other pulmonary embolism without acute cor pulmonale: Secondary | ICD-10-CM | POA: Diagnosis present

## 2012-02-04 DIAGNOSIS — F341 Dysthymic disorder: Secondary | ICD-10-CM

## 2012-02-04 DIAGNOSIS — R748 Abnormal levels of other serum enzymes: Secondary | ICD-10-CM | POA: Diagnosis present

## 2012-02-04 DIAGNOSIS — E871 Hypo-osmolality and hyponatremia: Secondary | ICD-10-CM | POA: Diagnosis present

## 2012-02-04 DIAGNOSIS — I82409 Acute embolism and thrombosis of unspecified deep veins of unspecified lower extremity: Secondary | ICD-10-CM

## 2012-02-04 DIAGNOSIS — R112 Nausea with vomiting, unspecified: Secondary | ICD-10-CM

## 2012-02-04 DIAGNOSIS — I10 Essential (primary) hypertension: Secondary | ICD-10-CM

## 2012-02-04 DIAGNOSIS — J9601 Acute respiratory failure with hypoxia: Secondary | ICD-10-CM | POA: Diagnosis present

## 2012-02-04 DIAGNOSIS — Z862 Personal history of diseases of the blood and blood-forming organs and certain disorders involving the immune mechanism: Secondary | ICD-10-CM

## 2012-02-04 LAB — IRON AND TIBC: UIBC: 365 ug/dL (ref 125–400)

## 2012-02-04 LAB — CBC
HCT: 27.9 % — ABNORMAL LOW (ref 36.0–46.0)
Hemoglobin: 8.8 g/dL — ABNORMAL LOW (ref 12.0–15.0)
MCH: 22.1 pg — ABNORMAL LOW (ref 26.0–34.0)
MCHC: 31.5 g/dL (ref 30.0–36.0)
MCV: 70.1 fL — ABNORMAL LOW (ref 78.0–100.0)
RBC: 3.98 MIL/uL (ref 3.87–5.11)
WBC: 9.3 10*3/uL (ref 4.0–10.5)

## 2012-02-04 LAB — HEPARIN LEVEL (UNFRACTIONATED): Heparin Unfractionated: 0.63 IU/mL (ref 0.30–0.70)

## 2012-02-04 LAB — COMPREHENSIVE METABOLIC PANEL
Albumin: 3 g/dL — ABNORMAL LOW (ref 3.5–5.2)
Alkaline Phosphatase: 74 U/L (ref 39–117)
BUN: 10 mg/dL (ref 6–23)
Chloride: 100 mEq/L (ref 96–112)
Glucose, Bld: 118 mg/dL — ABNORMAL HIGH (ref 70–99)
Potassium: 3.2 mEq/L — ABNORMAL LOW (ref 3.5–5.1)
Total Bilirubin: 0.2 mg/dL — ABNORMAL LOW (ref 0.3–1.2)

## 2012-02-04 LAB — RETICULOCYTES
RBC.: 4.09 MIL/uL (ref 3.87–5.11)
Retic Ct Pct: 1.1 % (ref 0.4–3.1)

## 2012-02-04 LAB — CARDIAC PANEL(CRET KIN+CKTOT+MB+TROPI): Troponin I: 0.48 ng/mL (ref <0.30)

## 2012-02-04 LAB — VITAMIN B12: Vitamin B-12: 215 pg/mL (ref 211–911)

## 2012-02-04 LAB — PROTIME-INR: Prothrombin Time: 15 seconds (ref 11.6–15.2)

## 2012-02-04 MED ORDER — CYANOCOBALAMIN 1000 MCG/ML IJ SOLN
1000.0000 ug | Freq: Every day | INTRAMUSCULAR | Status: DC
  Administered 2012-02-04 – 2012-02-05 (×2): 1000 ug via SUBCUTANEOUS
  Filled 2012-02-04 (×3): qty 1

## 2012-02-04 MED ORDER — POTASSIUM CHLORIDE CRYS ER 20 MEQ PO TBCR
40.0000 meq | EXTENDED_RELEASE_TABLET | Freq: Once | ORAL | Status: AC
  Administered 2012-02-04: 40 meq via ORAL
  Filled 2012-02-04: qty 2

## 2012-02-04 MED ORDER — WARFARIN SODIUM 7.5 MG PO TABS
7.5000 mg | ORAL_TABLET | Freq: Once | ORAL | Status: AC
  Administered 2012-02-04: 7.5 mg via ORAL
  Filled 2012-02-04 (×2): qty 1

## 2012-02-04 MED ORDER — POTASSIUM CHLORIDE CRYS ER 20 MEQ PO TBCR
40.0000 meq | EXTENDED_RELEASE_TABLET | Freq: Two times a day (BID) | ORAL | Status: DC
  Administered 2012-02-04 – 2012-02-05 (×2): 40 meq via ORAL
  Filled 2012-02-04 (×3): qty 2

## 2012-02-04 NOTE — Progress Notes (Signed)
Utilization Review Completed.Kimberly Monroe T5/10/2011

## 2012-02-04 NOTE — Progress Notes (Signed)
*  PRELIMINARY RESULTS* Vascular Ultrasound Bilateral lower extremity venous duplex has been completed. No evidence of lower extremity deep vein thrombosis bilaterally. All veins compressible.   Malachy Moan, RDMS, RDCS 02/04/2012, 4:02 PM

## 2012-02-04 NOTE — Progress Notes (Signed)
ANTICOAGULATION CONSULT NOTE - Follow Up Consult  Pharmacy Consult for Heparin and Coumadin Indication: pulmonary embolus  Allergies  Allergen Reactions  . Penicillins Other (See Comments)    Possibility-patient has never taken it    Patient Measurements: Height: 5\' 5"  (165.1 cm) Weight: 214 lb 4.6 oz (97.2 kg) IBW/kg (Calculated) : 57  Heparin Dosing Weight: 76 kg  Vital Signs: Temp: 98.2 F (36.8 C) (05/01 0400) Temp src: Oral (05/01 0400) BP: 111/64 mmHg (05/01 0400) Pulse Rate: 66  (05/01 0400)  Labs:  Basename 02/04/12 0420 02/04/12 0011 02/03/12 1659 02/03/12 0904 02/03/12 0014 02/02/12 2305  HGB 8.8* -- -- -- -- 9.8*  HCT 27.9* -- -- -- -- 30.6*  PLT 304 -- -- -- -- 345  APTT -- -- -- -- 35 --  LABPROT 15.0 -- -- -- 14.5 --  INR 1.16 -- -- -- 1.11 --  HEPARINUNFRC 0.43 -- 0.29* 0.32 -- --  CREATININE 0.75 -- -- -- -- 0.80  CKTOTAL -- 39 42 50 -- --  CKMB -- 2.0 2.4 2.6 -- --  TROPONINI -- 0.48* 0.55* 0.68* -- --   Estimated Creatinine Clearance: 97.1 ml/min (by C-G formula based on Cr of 0.75).   Medications:  Scheduled:     . aspirin EC  81 mg Oral Daily  . clonazePAM  1 mg Oral Daily  . coumadin book   Does not apply Once  . lamoTRIgine  50 mg Oral Daily  . mulitivitamin with minerals  2 tablet Oral Daily  . sertraline  100 mg Oral Daily  . sodium chloride  3 mL Intravenous Q12H  . sodium chloride  3 mL Intravenous Q12H  . warfarin  7.5 mg Oral ONCE-1800  . warfarin   Does not apply Once  . Warfarin - Pharmacist Dosing Inpatient   Does not apply q1800   Infusions:     . heparin 1,550 Units/hr (02/04/12 0500)    Assessment: 51 yo female with h/o ovarian/breast cancer c/o intermittent non-radiating CP aggravated with deep inhalation and prone position, found with PE on CT 4/29.  Heparin is therapeutic on 1550 units/hr. No bleeding noted but H/H low. Platelets are stable.  Today will be day #2/5 mimimum overlap required for VTE core measures.     Goal of Therapy:  Heparin level 0.3-0.7   Plan:  Continue heparin infusion at 1550 units/hr Confirmatory heparin level at 11:00 Coumadin 7.5 mg po tonight Daily heparin level, CBC, and INR  [facility], [place].D., BCPS Clinical Pharmacist Pager: [phone] 02/04/2012 9:01 AM

## 2012-02-04 NOTE — Progress Notes (Signed)
[facility] [place] TEAM 1 - Stepdown/ICU TEAM  Subjective: No symptoms of shortness of breath or chest pain at rest. Unfortunately with ambulation patient has significant dyspnea on exertion. She also informs [place] that after talking with her sister it appears that there is some sort of family history of either antiphospholipid and/or Cardiolite and antibody positive status within the family. She also endorses that she has been recently diagnosed with iron deficiency anemia but because colonoscopy was pending she was not started on iron replacement. She also endorses she continues to have menstrual cycles and usually these are heavy. Gynecological workup apparently has not been pursued.  Objective: Blood pressure 123/76, pulse 71, temperature 97.7 F (36.5 C), temperature source Oral, resp. rate 18, height 5\' 5"  (1.651 m), weight 97.2 kg (214 lb 4.6 oz), last menstrual period 01/26/2012, SpO2 97.00%.  General appearance: alert, cooperative, appears stated age and no distress Resp: clear to auscultation bilaterally Cardio: regular rate and rhythm, S1, S2 normal, no murmur, click, rub or gallop GI: soft, non-tender; bowel sounds normal; no masses,  no organomegaly Extremities: extremities normal, atraumatic, no cyanosis or edema Neurologic: Grossly normal  Lab Results:  Basename 02/04/12 0420 02/02/12 2305  WBC 9.3 11.9*  HGB 8.8* 9.8*  HCT 27.9* 30.6*  PLT 304 345   BMET  Basename 02/04/12 0420 02/02/12 2305  NA 134* 137  K 3.2* 3.5  CL 100 102  CO2 24 23  GLUCOSE 118* 98  BUN 10 8  CREATININE 0.75 0.80  CALCIUM 8.7 9.4    Studies/Results: Dg Chest 2 View  02/02/2012  *RADIOLOGY REPORT*  Clinical Data: Chest pain, shortness of breath  CHEST - 2 VIEW  Comparison: 11/30/2009  Findings: Mild lingular and left lower lobe opacity, may be exaggerated by prominent epicardial fat. Heart size upper normal. No pleural effusion or pneumothorax.  No acute osseous abnormality.   IMPRESSION: Mild lingular/left lower lobe opacity, likely scarring or atelectasis.  Early infiltrate not excluded in the appropriate clinical setting.  Original Report Authenticated By: Waneta Martins, M.D.   Ct Angio Chest W/cm &/or Wo Cm  02/03/2012  *RADIOLOGY REPORT*  Clinical Data: Shortness of breath, history of squamous cell carcinoma and ovarian cancer, evaluate for pulmonary embolism  CT ANGIOGRAPHY CHEST  Technique:  Multidetector CT imaging of the chest using the standard protocol during bolus administration of intravenous contrast. Multiplanar reconstructed images including MIPs were obtained and reviewed to evaluate the vascular anatomy.  Contrast: 80mL OMNIPAQUE IOHEXOL 350 MG/ML SOLN  Comparison: Chest radiograph - earlier same day  Findings:  There is adequate opacification of the pulmonary vasculature within main pulmonary artery measuring 327 HU.  There is extensive pulmonary embolism seen within the right main pulmonary artery and extending to all of the major segmental branch vessels of the right lung.  Pulmonary embolism is seen within all the major segmental branches involving the left lung.  No embolism is seen within the main pulmonary artery.  The caliber of the main pulmonary artery is normal.  There is no CT evidence of right-sided heart strain.  There is no reflux of ingested contrast into the hepatic venous system.  Borderline cardiomegaly.  No pericardial effusion.  Normal caliber of the thoracic aorta.  Normal configuration of the thoracic aortic arch.  Visualized portions of these cervical vasculature are patent.  The descending thoracic aorta is of normal caliber.  No thoracic aortic dissection.  No periaortic stranding.  Nonvascular findings:  There is minimal bibasilar dependent ground-glass  opacities due to atelectasis.  No focal airspace opacity.  No pleural effusion or pneumothorax.  Central airways are patent. No definite mediastinal, hilar or axillary lymphadenopathy.   Limited early arterial phase evaluation of the upper abdomen is normal.  No acute or aggressive osseous abnormalities.  IMPRESSION: Positive for pulmonary embolism with clot seen within all major segmental branches of both pulmonary arteries.  There is no CT evidence of right-sided heart strain though further evaluation may be obtained with cardiac echo as clinically indicated.  Above findings discussed with Dr. Hyman Hopes at [phone].  Original Report Authenticated By: Waynard Reeds, M.D.    Medications: I have reviewed the patient's current medications.  Assessment/Plan:  Bilateral pulmonary embolism *Primary risk factor to his recent prolonged car trip although apparently patient may have a family history of hypercoagulable disorder *2-D echo shows changes in the IVC that are consistent with mild RV dysfunction *Anticoagulation has been started and pharmacy is managing both heparin and Coumadin *Hemodynamically stable consistent with only mild RV strain *As a precaution venous Dopplers are pending to rule out possible large remaining DVT. If present with considerable clot burden may benefit from IVC filter *needs hypercoag w/u given reported family hx  Acute respiratory failure with hypoxia *Continues to require nasal cannula oxygen and become symptomatic with exertion *We'll continue supportive care and expect will take several days for respiratory symptoms to improve since patient has significant bilateral pulmonary clot burden  Hypokalemia/Hyponatremia *Both electrolyte disorders are mild; we'll continue to monitor the sodium but we'll replete potassium and follow electrolyte panel in the morning  Elevation of cardiac enzymes *Felt to be related to acute pulmonary embolism *No other indication of acute cardiac ischemia based on EKG and patient's current symptoms  Family History of hypercoagulable state *Hypercoagulable panel is pending *May have false positive results in setting of acute PE  so once initial 86-month period of anticoagulation complete patient may need to be evaluated by hematologist to repeat studies  Depression   Iron deficiency anemia - severe *Evaluation in process as an outpatient by her primary care physician *Iron mid March 2013 15 and currently not on replacement *Colonoscopy negative *We will repeat an anemia panel here. If I remains low consider IV iron infusion and most likely will need to discharge home on iron repletion *Hemoglobin today 8.8 after - monitor in setting of Coumadin *Patient has history of heavy menses and may need gynecological workup as an outpatient-she is perimenopausal and could be experiencing expected perimenopausal bleeding or she could have undiagnosed fibroids  Disposition *Transfer to telemetry   LOS: 2 days   Junious Silk, ANP pager [phone]  [facility] [email or web address] Password: [email or web address]  02/04/2012, 12:40 PM  I have personally examined this patient and reviewed the entire database. I have reviewed the above note, made any necessary editorial changes, and agree with its content.  Lonia Blood, MD [facility]

## 2012-02-04 NOTE — Plan of Care (Signed)
Problem: Phase II Progression Outcomes Goal: Progress activity as tolerated unless otherwise ordered Outcome: Not Progressing Increase SOB with any slight activites

## 2012-02-04 NOTE — Progress Notes (Signed)
ANTICOAGULATION CONSULT NOTE - Follow Up Consult  Pharmacy Consult for Heparin Indication: pulmonary embolus  Allergies  Allergen Reactions  . Penicillins Other (See Comments)    Possibility-patient has never taken it    Patient Measurements: Height: 5\' 5"  (165.1 cm) Weight: 214 lb 4.6 oz (97.2 kg) IBW/kg (Calculated) : 57  Heparin Dosing Weight: 76 kg  Labs:  Basename 02/04/12 1059 02/04/12 0420 02/04/12 0011 02/03/12 1659 02/03/12 0904 02/03/12 0014 02/02/12 2305  HGB -- 8.8* -- -- -- -- 9.8*  HCT -- 27.9* -- -- -- -- 30.6*  PLT -- 304 -- -- -- -- 345  APTT -- -- -- -- -- 35 --  LABPROT -- 15.0 -- -- -- 14.5 --  INR -- 1.16 -- -- -- 1.11 --  HEPARINUNFRC 0.63 0.43 -- 0.29* -- -- --  CREATININE -- 0.75 -- -- -- -- 0.80  CKTOTAL -- -- 39 42 50 -- --  CKMB -- -- 2.0 2.4 2.6 -- --  TROPONINI -- -- 0.48* 0.55* 0.68* -- --   Estimated Creatinine Clearance: 97.1 ml/min (by C-G formula based on Cr of 0.75).   Medications:   Infusions:     . heparin 1,550 Units/hr (02/04/12 1035)    Assessment: 51 yo female with h/o ovarian/breast cancer c/o intermittent non-radiating CP aggravated with deep inhalation and prone position, found with PE on CT 4/29.  Second heparin level is therapeutic on 1550 units/hr. No bleeding noted but H/H low. Platelets are stable.  Today will be day #2/5 mimimum overlap required for VTE core measures.   Goal of Therapy:  Heparin level 0.3-0.7   Plan:  Continue heparin infusion at 1550 units/hr Daily heparin level, CBC, and INR  [facility], [address].D., BCPS Clinical Pharmacist Pager: [phone] 02/04/2012 11:45 AM

## 2012-02-05 DIAGNOSIS — R748 Abnormal levels of other serum enzymes: Secondary | ICD-10-CM

## 2012-02-05 DIAGNOSIS — F341 Dysthymic disorder: Secondary | ICD-10-CM

## 2012-02-05 DIAGNOSIS — R112 Nausea with vomiting, unspecified: Secondary | ICD-10-CM

## 2012-02-05 DIAGNOSIS — I10 Essential (primary) hypertension: Secondary | ICD-10-CM

## 2012-02-05 LAB — LUPUS ANTICOAGULANT PANEL
PTT Lupus Anticoagulant: 84.1 secs — ABNORMAL HIGH (ref 28.0–43.0)
PTTLA 4:1 Mix: 92.4 secs — ABNORMAL HIGH (ref 28.0–43.0)
PTTLA Confirmation: 0.4 secs (ref <8.0)

## 2012-02-05 LAB — CBC
MCH: 21.4 pg — ABNORMAL LOW (ref 26.0–34.0)
MCV: 69.6 fL — ABNORMAL LOW (ref 78.0–100.0)
Platelets: 290 10*3/uL (ref 150–400)
RDW: 17 % — ABNORMAL HIGH (ref 11.5–15.5)
WBC: 10 10*3/uL (ref 4.0–10.5)

## 2012-02-05 LAB — BASIC METABOLIC PANEL
BUN: 8 mg/dL (ref 6–23)
CO2: 25 mEq/L (ref 19–32)
Calcium: 8.9 mg/dL (ref 8.4–10.5)
Chloride: 106 mEq/L (ref 96–112)
Creatinine, Ser: 0.64 mg/dL (ref 0.50–1.10)
GFR calc Af Amer: >90 mL/min (ref >90)
GFR calc non Af Amer: >90 mL/min (ref >90)
Glucose, Bld: 107 mg/dL — ABNORMAL HIGH (ref 70–99)
Potassium: 4.2 mEq/L (ref 3.5–5.1)
Sodium: 139 mEq/L (ref 135–145)

## 2012-02-05 LAB — PROTEIN C ACTIVITY: Protein C Activity: 117 % (ref 75–133)

## 2012-02-05 LAB — HEPARIN LEVEL (UNFRACTIONATED): Heparin Unfractionated: 0.58 IU/mL (ref 0.30–0.70)

## 2012-02-05 LAB — PROTIME-INR: Prothrombin Time: 15.1 seconds (ref 11.6–15.2)

## 2012-02-05 LAB — PROTEIN S ACTIVITY: Protein S Activity: 73 % (ref 69–129)

## 2012-02-05 MED ORDER — ENOXAPARIN SODIUM 150 MG/ML ~~LOC~~ SOLN
150.0000 mg | SUBCUTANEOUS | Status: DC
  Administered 2012-02-05 – 2012-02-06 (×2): 150 mg via SUBCUTANEOUS
  Filled 2012-02-05 (×2): qty 1

## 2012-02-05 MED ORDER — HEPARIN (PORCINE) IN NACL 100-0.45 UNIT/ML-% IJ SOLN: 1550.0000 [IU]/h | INTRAMUSCULAR | Status: DC

## 2012-02-05 MED ORDER — DOCUSATE SODIUM 100 MG PO CAPS
100.0000 mg | ORAL_CAPSULE | Freq: Two times a day (BID) | ORAL | Status: DC
  Administered 2012-02-05 – 2012-02-06 (×2): 100 mg via ORAL
  Filled 2012-02-05 (×4): qty 1

## 2012-02-05 MED ORDER — WARFARIN SODIUM 10 MG PO TABS
10.0000 mg | ORAL_TABLET | Freq: Once | ORAL | Status: AC
  Administered 2012-02-05: 10 mg via ORAL
  Filled 2012-02-05: qty 1

## 2012-02-05 MED ORDER — ADULT MULTIVITAMIN W/MINERALS CH
1.0000 | ORAL_TABLET | Freq: Every day | ORAL | Status: DC
  Administered 2012-02-06: 1 via ORAL
  Filled 2012-02-05: qty 1

## 2012-02-05 MED ORDER — FERROUS SULFATE 325 (65 FE) MG PO TABS
325.0000 mg | ORAL_TABLET | Freq: Three times a day (TID) | ORAL | Status: DC
  Administered 2012-02-05 – 2012-02-06 (×4): 325 mg via ORAL
  Filled 2012-02-05 (×5): qty 1

## 2012-02-05 NOTE — Progress Notes (Signed)
Pt ambulated in hallway 117ft on room air with standby assist. Pt on portable pulse oximeter. Pt SaO2 >95% however pt stated she was "light headed" and "having to breath harder". Returned pt to her bed, pt stated she was tired after the walk. Transferred pt to room pulse oximetry, SaO2 98% room air. Told her symptoms to call for. Will continue to monitor

## 2012-02-05 NOTE — Progress Notes (Addendum)
[facility] [place] TEAM 2   Subjective: No symptoms of shortness of breath or chest pain at rest. had significant dyspnea on exertion yesterday. She also informs [place] that after talking with her sister it appears that there is some sort of family history of either antiphospholipid and/or Cardiolite and antibody positive status within the family. She also endorses that she has been recently diagnosed with iron deficiency anemia but because colonoscopy was pending she was not started on iron replacement, 2/2 to likely false + results of this. She also endorses she continues to have menstrual cycles and usually these are heavy. Gynecological workup apparently has not been pursued.Colonoscopy last week 4/25 showed no polyps Objective: Blood pressure 106/65, pulse 60, temperature 98.2 F (36.8 C), temperature source Oral, resp. rate 18, height 5\' 5"  (1.651 m), weight 97.2 kg (214 lb 4.6 oz), last menstrual period 01/26/2012, SpO2 98.00%.  General appearance: alert, cooperative, appears stated age and no distress Resp: clear to auscultation bilaterally Cardio: regular rate and rhythm, S1, S2 normal, no murmur, click, rub or gallop GI: soft, non-tender; bowel sounds normal; no masses,  no organomegaly Extremities: extremities normal, atraumatic, no cyanosis or edema Neurologic: Grossly normal  Lab Results:  Basename 02/05/12 0532 02/04/12 0420  WBC 10.0 9.3  HGB 8.4* 8.8*  HCT 27.3* 27.9*  PLT 290 304   BMET  Basename 02/05/12 0532 02/04/12 0420  NA 139 134*  K 4.2 3.2*  CL 106 100  CO2 25 24  GLUCOSE 107* 118*  BUN 8 10  CREATININE 0.64 0.75  CALCIUM 8.9 8.7    Studies/Results: No results found.  Medications: I have reviewed the patient's current medications.  Assessment/Plan:  Bilateral pulmonary embolism *Primary risk factor to his recent prolonged car trip although apparently patient may have a family history of hypercoagulable disorder-she had a work-up this  admission that would be equivocal as she was on coumadid which would affect protein c/s-would re-test her off of this *2-D echo shows changes in the IVC that are consistent with mild RV dysfunction *Anticoagulation has been started and pharmacy is managing both heparin and Coumadin-I have transitioned her to Lovenox 5/2 *Hemodynamically stable consistent with only mild RV strain *As a precaution venous Dopplers were done 5/1 which were neative *she might hyper metabolize coumadin-if her INR doesn' show an appreciable increase, would use an alternate like Xarelto in 1-2 days  Acute respiratory failure with hypoxia *Continues to require nasal cannula oxygen and become symptomatic with exertion *We'll continue supportive care and expect will take several days for respiratory symptoms to improve since patient has significant bilateral pulmonary clot burden  Hypokalemia/Hyponatremia *Both electrolyte disorders are mild; we'll continue to monitor the sodium but we'll replete potassium-Potasssium today was 4.2 Held on Potassiu and will rpt BMEt in am  Elevation of cardiac enzymes *Felt to be related to acute pulmonary embolism *No other indication of acute cardiac ischemia based on EKG and patient's current symptoms  Family History of hypercoagulable state *Hypercoagulable panel  false positive results in setting of acute PE so once initial 81-month period of anticoagulation complete patient may need to be evaluated by hematologist to repeat studies  Depression *Clonopin, cont Lamictal, Sertraline   Iron deficiency anemia - severe *Evaluation in process as an outpatient by her primary care physician *Iron mid March 2013 15 and currently not on replacement *Colonoscopy negative *We will repeat an anemia panel here. If I remains low consider IV iron infusion and most likely will need to discharge  home on iron repletion *Hemoglobin today 9.8-8.8-8.4 after - monitor in setting of Coumadin--If she  drops further, would anticoagulate cautiously and ask Heme their opinion vs IV Iron infusion-Iron<10 here, Retic acceptable-Microcytic with MCV 68 *Patient has history of heavy menses and may need gynecological workup as an outpatient-she is perimenopausal and could be experiencing expected perimenopausal bleeding or she could have undiagnosed fibroids  Disposition *Transition to Lovenox and then Coumadin   LOS: 3 days   Pleas Koch [phone]  [facility] [email or web address] Password: [email or web address]  02/05/2012, 4:04 PM

## 2012-02-05 NOTE — Progress Notes (Signed)
ANTICOAGULATION CONSULT NOTE - Follow Up Consult  Pharmacy Consult for Heparin Indication: pulmonary embolus  Allergies  Allergen Reactions  . Penicillins Other (See Comments)    Possibility-patient has never taken it    Patient Measurements: Height: 5\' 5"  (165.1 cm) Weight: 214 lb 4.6 oz (97.2 kg) IBW/kg (Calculated) : 57  Heparin Dosing Weight: 76 kg  Labs:  Basename 02/05/12 0532 02/04/12 1059 02/04/12 0420 02/04/12 0011 02/03/12 1659 02/03/12 0904 02/03/12 0014 02/02/12 2305  HGB 8.4* -- 8.8* -- -- -- -- --  HCT 27.3* -- 27.9* -- -- -- -- 30.6*  PLT 290 -- 304 -- -- -- -- 345  APTT -- -- -- -- -- -- 35 --  LABPROT 15.1 -- 15.0 -- -- -- 14.5 --  INR 1.17 -- 1.16 -- -- -- 1.11 --  HEPARINUNFRC 0.58 0.63 0.43 -- -- -- -- --  CREATININE 0.64 -- 0.75 -- -- -- -- 0.80  CKTOTAL -- -- -- 39 42 50 -- --  CKMB -- -- -- 2.0 2.4 2.6 -- --  TROPONINI -- -- -- 0.48* 0.55* 0.68* -- --   Estimated Creatinine Clearance: 97.1 ml/min (by C-G formula based on Cr of 0.64).   Medications:   Infusions:     . heparin 1,550 Units/hr (02/05/12 0014)    Assessment: 51 yo female with h/o ovarian/breast cancer c/o intermittent non-radiating CP aggravated with deep inhalation and prone position, found with PE on CT 4/29.  Heparin level of 0.58 is therapeutic on 1550 units/hr. INR not moving after 2 doses of 7.5 mg coumadin. No bleeding noted but H/H low. Platelets are stable. Iron level undetectable on anemia panel.   Today will be day #3/5 mimimum overlap required for VTE core measures.   Goal of Therapy:  Heparin level 0.3-0.7   Plan:  Continue heparin infusion at 1550 units/hr Coumadin 10 mg po x 1 dose today Consider Iron dextran: 25 mg test dose then 500mg  IV q24 x 2 doses for total of 1000 mg to replete iron stores then po iron Daily heparin level, CBC, and INR Herby Abraham, Pharm.D. [phone] 02/05/2012 11:40 AM

## 2012-02-05 NOTE — Progress Notes (Signed)
ANTICOAGULATION CONSULT NOTE - Follow Up Consult  Pharmacy Consult for changing from IV Heparin to SQ Lovenox Indication: pulmonary embolus  Allergies  Allergen Reactions  . Penicillins Other (See Comments)    Possibility-patient has never taken it    Patient Measurements: Height: 5\' 5"  (165.1 cm) Weight: 214 lb 4.6 oz (97.2 kg) IBW/kg (Calculated) : 57  Heparin Dosing Weight: 76 kg  Labs:  Basename 02/05/12 0532 02/04/12 1059 02/04/12 0420 02/04/12 0011 02/03/12 1659 02/03/12 0904 02/03/12 0014 02/02/12 2305  HGB 8.4* -- 8.8* -- -- -- -- --  HCT 27.3* -- 27.9* -- -- -- -- 30.6*  PLT 290 -- 304 -- -- -- -- 345  APTT -- -- -- -- -- -- 35 --  LABPROT 15.1 -- 15.0 -- -- -- 14.5 --  INR 1.17 -- 1.16 -- -- -- 1.11 --  HEPARINUNFRC 0.58 0.63 0.43 -- -- -- -- --  CREATININE 0.64 -- 0.75 -- -- -- -- 0.80  CKTOTAL -- -- -- 39 42 50 -- --  CKMB -- -- -- 2.0 2.4 2.6 -- --  TROPONINI -- -- -- 0.48* 0.55* 0.68* -- --   Estimated Creatinine Clearance: 97.1 ml/min (by C-G formula based on Cr of 0.64).   Medications:   Infusions:     . heparin 1,550 Units/hr (02/05/12 0014)    Assessment:  Order received to switch from IV heparin drip to SQ Lovenox for PE in this 51 yo female with h/o ovarian/breast cancer. Currently on day #3/5 minimum overlap required for VTE core measures.  No bleeding noted but H/H low. Platelets are stable. Iron level undetectable on anemia panel.  Weight = 97 kg,  SCr= 0.64, CrCl = 97 ml/min   Goal of Therapy:  4 hr Heparin level 0.6-1.2 units/ml   Plan:  DC heparin drip now. 1 hour after Heparin DC'd,  Give Lovenox 150mg  SQ q24hrs.   CBC q72 hrs.  INR qAM.    Noah Delaine, RPh Clinical Pharmacist 02/05/2012  15:44

## 2012-02-06 DIAGNOSIS — F341 Dysthymic disorder: Secondary | ICD-10-CM

## 2012-02-06 DIAGNOSIS — R748 Abnormal levels of other serum enzymes: Secondary | ICD-10-CM

## 2012-02-06 DIAGNOSIS — R112 Nausea with vomiting, unspecified: Secondary | ICD-10-CM

## 2012-02-06 DIAGNOSIS — I10 Essential (primary) hypertension: Secondary | ICD-10-CM

## 2012-02-06 LAB — BASIC METABOLIC PANEL
CO2: 26 mEq/L (ref 19–32)
Calcium: 9.2 mg/dL (ref 8.4–10.5)
Creatinine, Ser: 0.74 mg/dL (ref 0.50–1.10)
GFR calc Af Amer: >90 mL/min (ref >90)
Glucose, Bld: 93 mg/dL (ref 70–99)

## 2012-02-06 LAB — PROTIME-INR
INR: 1.52 — ABNORMAL HIGH (ref 0.00–1.49)
Prothrombin Time: 18.6 seconds — ABNORMAL HIGH (ref 11.6–15.2)

## 2012-02-06 LAB — HEMOGLOBIN A1C: Mean Plasma Glucose: 126 mg/dL — ABNORMAL HIGH (ref <117)

## 2012-02-06 LAB — GLUCOSE, CAPILLARY: Glucose-Capillary: 86 mg/dL (ref 70–99)

## 2012-02-06 LAB — CBC
HCT: 30.2 % — ABNORMAL LOW (ref 36.0–46.0)
Hemoglobin: 9.2 g/dL — ABNORMAL LOW (ref 12.0–15.0)
MCHC: 30.5 g/dL (ref 30.0–36.0)
MCV: 70.4 fL — ABNORMAL LOW (ref 78.0–100.0)
RDW: 17.3 % — ABNORMAL HIGH (ref 11.5–15.5)
WBC: 8.7 10*3/uL (ref 4.0–10.5)

## 2012-02-06 LAB — FACTOR 5 LEIDEN

## 2012-02-06 LAB — PROTEIN C, TOTAL: Protein C, Total: 67 % — ABNORMAL LOW (ref 72–160)

## 2012-02-06 MED ORDER — ENOXAPARIN SODIUM 150 MG/ML ~~LOC~~ SOLN: 150.0000 mg | SUBCUTANEOUS | Status: DC

## 2012-02-06 MED ORDER — FERROUS SULFATE 325 (65 FE) MG PO TABS: 325.0000 mg | ORAL_TABLET | Freq: Three times a day (TID) | ORAL | Status: DC

## 2012-02-06 MED ORDER — WARFARIN SODIUM 10 MG PO TABS: 10.0000 mg | ORAL_TABLET | Freq: Every day | ORAL | Status: DC

## 2012-02-06 MED ORDER — WARFARIN SODIUM 10 MG PO TABS
10.0000 mg | ORAL_TABLET | Freq: Once | ORAL | Status: AC
  Administered 2012-02-06: 10 mg via ORAL
  Filled 2012-02-06: qty 1

## 2012-02-06 MED ORDER — WARFARIN SODIUM 10 MG PO TABS: 10.0000 mg | ORAL_TABLET | Freq: Once | ORAL | Status: DC

## 2012-02-06 MED ORDER — ENOXAPARIN (LOVENOX) PATIENT EDUCATION KIT
PACK | Freq: Once | Status: AC
  Administered 2012-02-06: 16:00:00
  Filled 2012-02-06: qty 1

## 2012-02-06 NOTE — Progress Notes (Signed)
Demonstrated to pt how to give lovenox injection. Pt demonstrated her knowledge by administering lovenox injection herself. Pt was successful. Pt watched lovenox video and also was given lovenox kit. Pt feels ready for discharge and all questions have been answered. Pt instructed to administer once a day until next Md appointment within the next 4 days and she will have INR checked as well. All discharge instructions and prescriptions given to pt. Pt is ready for discharge.

## 2012-02-06 NOTE — Care Management Note (Signed)
    Page 1 of 1   02/06/2012     4:30:22 PM   CARE MANAGEMENT NOTE 02/06/2012  Patient:  Kimberly Monroe, Kimberly Monroe   Account Number:  [id number]  Date Initiated:  02/05/2012  Documentation initiated by:  Donn Pierini  Subjective/Objective Assessment:   Pt admitted with bil PE, acute resp failure     Action/Plan:   PTA pt lived at home with spouse, was independent with ADLs   Anticipated DC Date:  02/07/2012   Anticipated DC Plan:  HOME W HOME HEALTH SERVICES      DC Planning Services  CM consult      Choice offered to / List presented to:             Status of service:  Completed, signed off Medicare Important Message given?   (If response is "NO", the following Medicare IM given date fields will be blank) Date Medicare IM given:   Date Additional Medicare IM given:    Discharge Disposition:  HOME/SELF CARE  Per UR Regulation:  Reviewed for med. necessity/level of care/duration of stay  If discussed at Long Length of Stay Meetings, dates discussed:    Comments:  PCP- Burchette  02/06/12- 1600- Donn Pierini RN, BSN [phone] Pt for discharge today- to go home on Lovenox- benefits check completed- pt's coverage is $100 for a months supply. Spoke with pt at bedside- informed her of her coverage. Called [facility] at [facility] to confirm they have drug in stock- which they do- pt informed of this. Pt has question about Ferralet- spoke with MD who states that he is not going to prescribe that drug at discharge and that he had spoken with pt already. Called pt in room to let her know that MD was not going to prescribe the [facility] and that she would need to f/u with her PCP.  02/05/12- 1624- Donn Pierini RN, BSN [phone] Pt tx from ICU service, CM to follow for d/c needs/planning

## 2012-02-06 NOTE — Discharge Summary (Signed)
[facility] Hospital Discharge Summary  Date of Admission: 02/02/2012 10:02 PM Admitter: @ADMITPROV @   Date of Discharge5/12/2011 Attending Physician: Rhetta Mura, MD  Things to Follow-up on: Will need INR/CBC in 5 days Will ? 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 Per patient last Friday, she noted she started getting short of breath-mainly while ambulating. Over the weekend prior to admit, she noticed that her nail beds were blue in color. She then started having retrosternal chest discomfort as well.  Her shortness of breath worsened, and as a result she was brought to the hospital for further evaluation and treatment. She was found to have a large PE on a CT Angiogram of the chest  She was admitted to SDU initially given the size of her PE and an Echocardiogram was obtained.  See below for further hospital details    Hospital Course by problem list:  Bilateral pulmonary embolism  *Primary risk factor to his recent prolonged car trip although apparently patient may have a family history of hypercoagulable disorder-she had a work-up this admission that would be equivocal as she was on coumadid which would affect protein c/s-would re-test her off of this probably after duration of coumadin has been determined by out-patient MD.  She will need at least 3 months, but would hesitate to place her on 9 months of Coumadin given Gyn bleeding *2-D echo showed changes in the IVC that are consistent with mild RV dysfunction-there ewere no findings this admit of any RV strain and as she did progress well, she did not require Critical care supprot *Anticoagulation has been started  she was initially heparin and Coumadin-She was then transitioned to Lovenox 5/2  *As a precaution venous Dopplers were done 5/1 which were neative  *Will need close monitoring both INR and CBC and has been counseled about importance of consistent green vegetable intake   Acute respiratory failure with hypoxia  *initally required nasal cannula oxygen and become symptomatic with exertion-her O2 sats during hospital stay however stayed above 92% therefore there was no objective need for O2.  She will need to re-start daily activity slowly and this was stressed to her *Expect will take several days for respiratory symptoms to improve since patient has significant bilateral pulmonary clot burden   Hypokalemia/Hyponatremia  *Both electrolyte disorders are mild; we'll continue to monitor the sodium but we'll replete potassium-Potasssium today was 4.2  Held on Potassium and this normalized on d/c  Elevation of cardiac enzymes  *Felt to be related to acute pulmonary embolism  *No other indication of acute cardiac ischemia based on EKG and patient's current symptoms   Family History of hypercoagulable state  *Hypercoagulable panel false positive results in setting of acute PE so once initial 3-12 month period of anticoagulation complete patient may need to be evaluated by hematologist to repeat studies   Depression  *Clonopin, cont Lamictal, Sertraline   Iron deficiency anemia - severe  *Evaluation in process as an outpatient by her primary care physician  *Iron mid March 2013 15 and currently not on replacement  *Colonoscopy negative 01/29/12 *Hemoglobin today 9.8-8.8-8.4-->9.2 on discharge- monitor in setting of Coumadin---Iron<10 here, Retic acceptable-Microcytic with MCV 68  *Patient has history of heavy menses  and may need gynecological workup as an outpatient-she is perimenopausal and could be experiencing expected perimenopausal bleeding or she could have undiagnosed fibroids    LOS: 4 days      Procedures Performed and pertinent labs: Dg Chest 2 View  02/02/2012  *RADIOLOGY REPORT*  Clinical Data: Chest pain, shortness of breath  CHEST - 2 VIEW  Comparison: 11/30/2009  Findings: Mild lingular and left lower lobe opacity, may be exaggerated by prominent epicardial fat. Heart size upper normal. No pleural effusion or pneumothorax.  No acute osseous abnormality.  IMPRESSION: Mild lingular/left lower lobe opacity, likely scarring or atelectasis.  Early infiltrate not excluded in the appropriate clinical setting.  Original Report Authenticated By: Waneta Martins, M.D.   Ct Angio Chest W/cm &/or Wo Cm  02/03/2012  *RADIOLOGY REPORT*  Clinical Data: Shortness of breath, history of squamous cell carcinoma and ovarian cancer, evaluate for pulmonary embolism  CT ANGIOGRAPHY CHEST  Technique:  Multidetector CT imaging of the chest using the standard protocol during bolus administration of intravenous contrast. Multiplanar reconstructed images including MIPs were obtained and reviewed to evaluate the vascular anatomy.  Contrast: 80mL OMNIPAQUE IOHEXOL 350 MG/ML SOLN  Comparison: Chest radiograph - earlier same day  Findings:  There is adequate opacification of the pulmonary vasculature within main pulmonary artery measuring 327 HU.  There is extensive pulmonary embolism seen within the right main pulmonary artery and extending to all of the major segmental branch vessels of the right lung.  Pulmonary embolism is seen within all the major segmental branches involving the left lung.  No embolism is seen within the main pulmonary artery.  The caliber of the main pulmonary artery is normal.  There is no CT evidence of right-sided heart strain.  There is no reflux of ingested contrast into the hepatic venous system.  Borderline cardiomegaly.  No pericardial effusion.  Normal caliber of the thoracic aorta.  Normal configuration of the thoracic aortic arch.  Visualized portions of these cervical vasculature  are patent.  The descending thoracic aorta is of normal caliber.  No thoracic aortic dissection.  No periaortic stranding.  Nonvascular findings:  There is minimal bibasilar dependent ground-glass opacities due to atelectasis.  No focal airspace opacity.  No pleural effusion or pneumothorax.  Central airways are patent. No definite mediastinal, hilar or axillary lymphadenopathy.  Limited early arterial phase evaluation of the upper abdomen is normal.  No acute or aggressive osseous abnormalities.  IMPRESSION: Positive for pulmonary embolism with clot seen within all major segmental branches of both pulmonary arteries.  There is no CT evidence of right-sided heart strain though further evaluation may be obtained with cardiac echo as clinically indicated.  Above findings discussed with Dr. Hyman Hopes at [phone].  Original Report Authenticated By: Waynard Reeds, M.D.    Discharge Vitals & PE:  BP 119/72  Pulse 65  Temp(Src) 97.9 F (36.6 C) (Oral)  Resp 18  Ht 5\' 5"  (1.651 m)  Wt 97.2 kg (214 lb 4.6 oz)  BMI 35.66 kg/m2  SpO2 98%  LMP 01/26/2012 General appearance: alert, cooperative, appears stated age and no distress  Resp: clear to auscultation bilaterally  Cardio: regular rate and rhythm, S1, S2 normal, no murmur, click, rub or gallop  GI: soft, non-tender; bowel sounds normal; no masses, no organomegaly  Extremities: extremities normal, atraumatic, no cyanosis or edema  Neurologic: Grossly normal   Discharge Labs:  Results for orders placed during the hospital encounter of 02/02/12 (from the past 24 hour(s))  HEMOGLOBIN A1C     Status: Abnormal   Collection Time   02/05/12  4:15 PM      Component Value Range   Hemoglobin A1C 6.0 (*) <5.7 (%)   Mean Plasma Glucose 126 (*) <117 (mg/dL)  CBC     Status: Abnormal   Collection Time   02/06/12  6:10 AM      Component Value Range   WBC 8.7  4.0 - 10.5 (K/uL)   RBC 4.29  3.87 - 5.11 (MIL/uL)   Hemoglobin 9.2 (*) 12.0 - 15.0 (g/dL)   HCT 40.9 (*)  81.1 - 46.0 (%)   MCV 70.4 (*) 78.0 - 100.0 (fL)   MCH 21.4 (*) 26.0 - 34.0 (pg)   MCHC 30.5  30.0 - 36.0 (g/dL)   RDW 91.4 (*) 78.2 - 15.5 (%)   Platelets 335  150 - 400 (K/uL)  PROTIME-INR     Status: Abnormal   Collection Time   02/06/12  6:10 AM      Component Value Range   Prothrombin Time 18.6 (*) 11.6 - 15.2 (seconds)   INR 1.52 (*) 0.00 - 1.49   BASIC METABOLIC PANEL     Status: Normal   Collection Time   02/06/12  6:10 AM      Component Value Range   Sodium 138  135 - 145 (mEq/L)   Potassium 4.3  3.5 - 5.1 (mEq/L)   Chloride 104  96 - 112 (mEq/L)   CO2 26  19 - 32 (mEq/L)   Glucose, Bld 93  70 - 99 (mg/dL)   BUN 9  6 - 23 (mg/dL)   Creatinine, Ser 9.56  0.50 - 1.10 (mg/dL)   Calcium 9.2  8.4 - 21.3 (mg/dL)   GFR calc non Af Amer >90  >90 (mL/min)   GFR calc Af Amer >90  >90 (mL/min)  GLUCOSE, CAPILLARY     Status: Normal   Collection Time   02/06/12  6:11 AM      Component Value Range   Glucose-Capillary 86  70 - 99 (mg/dL)    Disposition and follow-up:   Ms.Kimberly Monroe was discharged from in stable condition.    Follow-up Appointments:  Follow-up Information    Follow up with Kristian Covey, MD. Schedule an appointment as soon as possible for a visit in 3 days. (You will need lab testing prior to seeing the doctor)    Contact information:   [address] Christena Flake [facility] [place] [address] [phone]          Discharge Medications: Medication List  As of 02/06/2012  2:10 PM   STOP taking these medications         IRON-VITAMINS PO      MOVIPREP 100 G Solr         TAKE these medications         CLONAZEPAM PO   Take 1 mg by mouth every morning.      enoxaparin 150 MG/ML injection   Commonly known as: LOVENOX   Inject 1 mL (150 mg total) into the skin daily.      ferrous sulfate 325 (65 FE) MG tablet   Take 1 tablet (325 mg total) by mouth 3 (three) times daily with meals.      LAMICTAL PO   Take 50 mg by mouth every morning.       mulitivitamin with minerals Tabs   Take 2 tablets by mouth daily. Juice plus gummies  SERTRALINE HCL PO   Take 100 mg by mouth every morning.      warfarin 10 MG tablet   Commonly known as: COUMADIN   Take 1 tablet (10 mg total) by mouth one time only at 6 PM.           Medications Discontinued During This Encounter  Medication Reason  . 0.9 %  sodium chloride infusion   . heparin ADULT infusion 100 units/mL (25000 units/250 mL)   . aspirin EC tablet 81 mg   . sodium chloride 0.9 % injection 3 mL   . sodium chloride 0.9 % injection 3 mL   . sodium chloride 0.9 % injection 3 mL   . 0.9 %  sodium chloride infusion   . mulitivitamin with minerals tablet 2 tablet Change in therapy  . potassium chloride SA (K-DUR,KLOR-CON) CR tablet 40 mEq   . heparin ADULT infusion 100 units/mL (25000 units/250 mL) Change in therapy  . heparin ADULT infusion 100 units/mL (25000 units/250 mL) Change in therapy  . peg 3350 powder (MOVIPREP) 100 G SOLR Stop Taking at Discharge  . IRON-VITAMINS PO Stop Taking at Discharge      > 40 minutes time spent preparing d/c summary, including direct face-face patient Time, contact with consultants, family and care coordination   Signed: Cleora Karnik,JAI 02/06/2012, 2:10 PM

## 2012-02-06 NOTE — Progress Notes (Signed)
ANTICOAGULATION CONSULT NOTE - Follow Up Consult  Pharmacy Consult for lovenox and coumadin Indication: pulmonary embolus  Allergies  Allergen Reactions  . Penicillins Other (See Comments)    Possibility-patient has never taken it    Patient Measurements: Height: 5\' 5"  (165.1 cm) Weight: 214 lb 4.6 oz (97.2 kg) IBW/kg (Calculated) : 57  Heparin Dosing Weight: 76 kg  Labs:  Basename 02/06/12 0610 02/05/12 0532 02/04/12 1059 02/04/12 0420 02/04/12 0011 02/03/12 1659  HGB 9.2* 8.4* -- -- -- --  HCT 30.2* 27.3* -- 27.9* -- --  PLT 335 290 -- 304 -- --  APTT -- -- -- -- -- --  LABPROT 18.6* 15.1 -- 15.0 -- --  INR 1.52* 1.17 -- 1.16 -- --  HEPARINUNFRC -- 0.58 0.63 0.43 -- --  CREATININE 0.74 0.64 -- 0.75 -- --  CKTOTAL -- -- -- -- 39 42  CKMB -- -- -- -- 2.0 2.4  TROPONINI -- -- -- -- 0.48* 0.55*   Estimated Creatinine Clearance: 97.1 ml/min (by C-G formula based on Cr of 0.74).     Assessment: 51 yo female with h/o ovarian/breast cancer c/o intermittent non-radiating CP aggravated with deep inhalation and prone position, found with PE on CT 4/29.  Heparin changed to lovenox  5/2 pm.  INR up to 1.52 after coumadin  7.5-7.5-10 mg doses. No bleeding noted but H/H low, but improved today. Platelets are stable. Iron level undetectable on anemia panel.  Protein C and S normal. Lupus anticoagulant lab abnormal but no lupus anticoagulant detected - unsure what this means.  Factor 5 leiden still pending.   Today will be day #4/5 mimimum overlap required for VTE core measures.   Goal of Therapy:  Heparin level 0.3-0.7   Plan:  Lovenox 1.5 mg/kg/day = 150 mg daily at 1800 Repeat Coumadin 10 mg po x 1 dose today Consider Iron dextran: 25 mg test dose then 500mg  IV q24 x 2 doses for total of 1000 mg to replete iron stores then po iron Daily INR Herby Abraham, Pharm.D. [phone] 02/06/2012 9:53 AM

## 2012-02-09 ENCOUNTER — Ambulatory Visit (INDEPENDENT_AMBULATORY_CARE_PROVIDER_SITE_OTHER): Payer: BC Managed Care – PPO | Admitting: Family Medicine

## 2012-02-09 ENCOUNTER — Encounter: Payer: Self-pay | Admitting: Family Medicine

## 2012-02-09 VITALS — BP 110/70 | HR 80 | Temp 98.1°F | Wt 209.0 lb

## 2012-02-09 DIAGNOSIS — I2699 Other pulmonary embolism without acute cor pulmonale: Secondary | ICD-10-CM

## 2012-02-09 DIAGNOSIS — Z862 Personal history of diseases of the blood and blood-forming organs and certain disorders involving the immune mechanism: Secondary | ICD-10-CM

## 2012-02-09 DIAGNOSIS — D509 Iron deficiency anemia, unspecified: Secondary | ICD-10-CM

## 2012-02-09 LAB — CBC WITH DIFFERENTIAL/PLATELET
Basophils Absolute: 0.1 10*3/uL (ref 0.0–0.1)
HCT: 31.9 % — ABNORMAL LOW (ref 36.0–46.0)
Hemoglobin: 9.9 g/dL — ABNORMAL LOW (ref 12.0–15.0)
Lymphocytes Relative: 28.3 % (ref 12.0–46.0)
Lymphs Abs: 2 10*3/uL (ref 0.7–4.0)
MCHC: 31 g/dL (ref 30.0–36.0)
MCV: 71 fl — ABNORMAL LOW (ref 78.0–100.0)
Monocytes Absolute: 0.4 10*3/uL (ref 0.1–1.0)
Neutro Abs: 4.4 10*3/uL (ref 1.4–7.7)
Platelets: 352 10*3/uL (ref 150.0–400.0)
RDW: 18.5 % — ABNORMAL HIGH (ref 11.5–14.6)

## 2012-02-09 LAB — BETA-2-GLYCOPROTEIN I ABS, IGG/M/A
Beta-2-Glycoprotein I IgA: 10 A Units (ref <20)
Beta-2-Glycoprotein I IgM: 4 M Units (ref <20)

## 2012-02-09 LAB — POCT INR: INR: 2.9

## 2012-02-09 LAB — CARDIOLIPIN ANTIBODIES, IGG, IGM, IGA
Anticardiolipin IgA: 7 APL U/mL — ABNORMAL LOW (ref <22)
Anticardiolipin IgG: 4 GPL U/mL — ABNORMAL LOW (ref <23)

## 2012-02-09 MED ORDER — WARFARIN SODIUM 10 MG PO TABS: 10.0000 mg | ORAL_TABLET | Freq: Every day | ORAL | Status: DC

## 2012-02-09 NOTE — Progress Notes (Signed)
  Subjective:    Patient ID: Kimberly Monroe, female    DOB: 10-26-1960, 51 y.o.   MRN: [id number]  HPI  Hospital followup. Patient has history of anemia probably from gynecologic loss and perimenstrual bleeding. She had recent colonoscopy which was normal. She developed last week some shortness of breath and pleuritic chest pain and this progressed over the weekend. She was admitted 02/02/2012 through 02/06/2012 with pulmonary embolism. She's had no prior history of DVT or hypercoagulable state. Does apparently have a sister who had prior DVT. Patient's other risk factor was prolonged travel prior to this. She had one 12 hour car trip and another recent 8 hour trip. Patient nonsmoker. Does not take any hormones.  Venous Dopplers were negative. Echocardiogram revealed increased right atrial pressures related to her PE otherwise normal. Patient remains on Lovenox and Coumadin 10 mg daily. Last INR on 02/06/2012 1.52.  No bleeding complications. No syncope or significant orthostasis or dizziness since discharge. She's taking iron replacement 3 times daily  Past Medical History  Diagnosis Date  . Depression   . History of blood clotting disorder   . Anemia   . Anxiety   . Allergy     seasonal  . Melanoma 2009    insitu  . Basal cell cancer   . Squamous cell carcinoma    Past Surgical History  Procedure Date  . Cesarean section   . Breast surgery 2009    L intraductal papilloma  . 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 Mohs surgery     reports that she has never smoked. She has never used smokeless tobacco. She reports that she does not drink alcohol or use illicit drugs. family history includes Cancer in her paternal uncle; Cancer (age of onset:70) in her father; and Colon cancer in her father. Allergies  Allergen Reactions  . Penicillins Other (See Comments)    Possibility-patient has never taken it     Review of Systems  Constitutional: Negative for fever, chills and  unexpected weight change.  Respiratory: Negative for cough and wheezing.   Cardiovascular: Negative for chest pain, palpitations and leg swelling.  Gastrointestinal: Negative for blood in stool.  Genitourinary: Negative for hematuria.  Neurological: Negative for dizziness and syncope.       Objective:   Physical Exam  Constitutional: She is oriented to person, place, and time. She appears well-developed and well-nourished.  HENT:  Mouth/Throat: Oropharynx is clear and moist.  Neck: Neck supple. No thyromegaly present.  Cardiovascular: Normal rate and regular rhythm.  Exam reveals no gallop.   Pulmonary/Chest: Effort normal and breath sounds normal. No respiratory distress. She has no wheezes. She has no rales.  Musculoskeletal: She exhibits no edema.  Lymphadenopathy:    She has no cervical adenopathy.  Neurological: She is alert and oriented to person, place, and time.          Assessment & Plan:  #1 pulmonary embolus. Risk factors include positive family history and prolonged travel. Patient clinically stable this time. Recheck INR and CBC today. Will consider hematology consult after she's been on treatment for a few months to determine duration. She did not have full panel of hypercoagulable blood studies during her hospitalization. #2 history of anemia with recent negative colonoscopy. Probably gynecologic loss. Recheck CBC today. Consider gynecologic referral if this is worsening and not improving with iron replacement.

## 2012-02-09 NOTE — Patient Instructions (Signed)
  Latest dosing instructions   Total Glynis Smiles Tue Wed Thu Fri Sat   60 10 mg 5 mg 10 mg 10 mg 10 mg 5 mg 10 mg    (10 mg1) (10 mg0.5) (10 mg1) (10 mg1) (10 mg1) (10 mg0.5) (10 mg1)

## 2012-02-10 NOTE — Progress Notes (Signed)
Quick Note:  Pt informed on personally identified VM ______ 

## 2012-02-13 ENCOUNTER — Telehealth: Payer: Self-pay | Admitting: Family Medicine

## 2012-02-13 ENCOUNTER — Ambulatory Visit: Payer: BC Managed Care – PPO

## 2012-02-13 NOTE — Patient Instructions (Signed)
  Latest dosing instructions   Total Sun Mon Tue Wed Thu Fri Sat   50 5 mg 10 mg 5 mg 10 mg 5 mg 10 mg 5 mg    (10 mg0.5) (10 mg1) (10 mg0.5) (10 mg1) (10 mg0.5) (10 mg1) (10 mg0.5)        

## 2012-02-13 NOTE — Telephone Encounter (Signed)
Avoid high-risk activities such as - high risk of contact, climbing (eg ladders).  Most daily activities should be tolerated without difficulty.

## 2012-02-13 NOTE — Telephone Encounter (Signed)
Spoke with patient.

## 2012-02-13 NOTE — Telephone Encounter (Signed)
Patient called stating that she recently got out of the hospital and she would like advise on how much activity she should be doing post pulmonary embolism. Patient states that her discharge instructions list advise for things that do not apply to her dx. Patient states her coumadin is really high and she is afraid she may do something that may worsen her symptoms as this is all new to her. Please call with advise.

## 2012-02-17 ENCOUNTER — Ambulatory Visit: Payer: BC Managed Care – PPO | Admitting: *Deleted

## 2012-02-17 DIAGNOSIS — I2699 Other pulmonary embolism without acute cor pulmonale: Secondary | ICD-10-CM

## 2012-02-17 LAB — POCT INR: INR: 2.7

## 2012-02-17 NOTE — Patient Instructions (Signed)
  Latest dosing instructions   Total Glynis Smiles Tue Wed Thu Fri Sat   50 5 mg 10 mg 5 mg 10 mg 5 mg 10 mg 5 mg    (10 mg0.5) (10 mg1) (10 mg0.5) (10 mg1) (10 mg0.5) (10 mg1) (10 mg0.5)

## 2012-02-24 ENCOUNTER — Ambulatory Visit (INDEPENDENT_AMBULATORY_CARE_PROVIDER_SITE_OTHER): Payer: BC Managed Care – PPO | Admitting: Family

## 2012-02-24 ENCOUNTER — Telehealth: Payer: Self-pay | Admitting: Family Medicine

## 2012-02-24 DIAGNOSIS — I2699 Other pulmonary embolism without acute cor pulmonale: Secondary | ICD-10-CM

## 2012-02-24 LAB — POCT INR: INR: 4.3

## 2012-02-24 NOTE — Patient Instructions (Signed)
Hold x 2 days. Then decrease to 5mg  a day.    Latest dosing instructions   Total Glynis Smiles Tue Wed Thu Fri Sat   35 5 mg 5 mg 5 mg 5 mg 5 mg 5 mg 5 mg    (10 mg0.5) (10 mg0.5) (10 mg0.5) (10 mg0.5) (10 mg0.5) (10 mg0.5) (10 mg0.5)

## 2012-02-24 NOTE — Telephone Encounter (Signed)
Pt would like vit b level results

## 2012-02-25 NOTE — Telephone Encounter (Signed)
Vit B 215  This was done during hospital admission.  Low limit of normal. If not already on would consider oral B12 supplement daily.

## 2012-02-26 NOTE — Telephone Encounter (Signed)
Pt informed on personally identified VM 

## 2012-02-27 ENCOUNTER — Emergency Department (HOSPITAL_COMMUNITY): Payer: BC Managed Care – PPO

## 2012-02-27 ENCOUNTER — Emergency Department (HOSPITAL_COMMUNITY)
Admission: EM | Admit: 2012-02-27 | Discharge: 2012-02-27 | Disposition: A | Discharge status: Home / Self Care | Payer: BC Managed Care – PPO | Attending: Emergency Medicine | Admitting: Emergency Medicine

## 2012-02-27 ENCOUNTER — Telehealth: Payer: Self-pay | Admitting: *Deleted

## 2012-02-27 DIAGNOSIS — F411 Generalized anxiety disorder: Secondary | ICD-10-CM | POA: Insufficient documentation

## 2012-02-27 DIAGNOSIS — Z7901 Long term (current) use of anticoagulants: Secondary | ICD-10-CM | POA: Insufficient documentation

## 2012-02-27 DIAGNOSIS — Z85820 Personal history of malignant melanoma of skin: Secondary | ICD-10-CM | POA: Insufficient documentation

## 2012-02-27 DIAGNOSIS — R079 Chest pain, unspecified: Secondary | ICD-10-CM | POA: Insufficient documentation

## 2012-02-27 DIAGNOSIS — R0602 Shortness of breath: Secondary | ICD-10-CM | POA: Insufficient documentation

## 2012-02-27 DIAGNOSIS — Z79899 Other long term (current) drug therapy: Secondary | ICD-10-CM | POA: Insufficient documentation

## 2012-02-27 LAB — PROTIME-INR
INR: 2.21 — ABNORMAL HIGH (ref 0.00–1.49)
Prothrombin Time: 24.9 seconds — ABNORMAL HIGH (ref 11.6–15.2)

## 2012-02-27 LAB — CBC
HCT: 35.9 % — ABNORMAL LOW (ref 36.0–46.0)
MCV: 74 fL — ABNORMAL LOW (ref 78.0–100.0)
Platelets: 518 10*3/uL — ABNORMAL HIGH (ref 150–400)
RBC: 4.85 MIL/uL (ref 3.87–5.11)
RDW: 22.9 % — ABNORMAL HIGH (ref 11.5–15.5)
WBC: 9.2 10*3/uL (ref 4.0–10.5)

## 2012-02-27 LAB — APTT: aPTT: 39 seconds — ABNORMAL HIGH (ref 24–37)

## 2012-02-27 LAB — POCT I-STAT, CHEM 8
BUN: 5 mg/dL — ABNORMAL LOW (ref 6–23)
Calcium, Ion: 1.2 mmol/L (ref 1.12–1.32)
Chloride: 104 mEq/L (ref 96–112)
Potassium: 3.9 mEq/L (ref 3.5–5.1)

## 2012-02-27 NOTE — ED Notes (Signed)
Pt states she has been having trouble catching her breath and having chest pain since last night. Pt states pain is only present when taking deep breath. Pain is left sided and non radiating. Pt has hx of PEs. Pt on coumadin. Pt states she had multiple abnormal labs when she was admitted last week.

## 2012-02-27 NOTE — ED Provider Notes (Signed)
Medical screening examination/treatment/procedure(s) were conducted as a shared visit with non-physician practitioner(s) and myself.  I personally evaluated the patient during the encounter 51 year old woman hospitalized recently for pulmonary embolism has developed chest pain and shortness of breath for 24 hours.  Se is on coumadin.  Exam shows her to be a middle aged woman in no distress.  Lungs clear, heart sounds normal, abdomen nontender, no calf tenderness.  Lab workup shows essentially normal CBC, chemistries, TNI, chest x-ray. INR therapeutic.  Case discussed with Evelena Peat, M.D., pt's PCP, who will see her in followup early next week.    Carleene Cooper III, MD 02/27/12 2109

## 2012-02-27 NOTE — ED Notes (Signed)
Pt reports left sided chest pain, pt states she feels like she can't take a deep breath reports nausea. Pt was here 3 weeks ago she states with elevated cardiac markers.

## 2012-02-27 NOTE — Discharge Instructions (Signed)
Dr. Lucie Leather office will be calling you to set up an appointment for early next week however return to ER at any time for changing or worsening of symptoms.   Shortness of Breath Shortness of breath (dyspnea) is the feeling of uneasy breathing. Shortness of breath does not always mean that there is a life-threatening illness. However, shortness of breath requires immediate medical care. CAUSES  Causes for shortness of breath include:  Not enough oxygen in the air (as with high altitudes or with a smoke-filled room).   Short-term (acute) lung disease, including:   Infections such as pneumonia.   Fluid in the lungs, such as heart failure.   A blood clot in the lungs (pulmonary embolism).   Lasting (chronic) lung diseases.   Heart disease (heart attack, angina, heart failure, and others).   Low red blood cells (anemia).   Poor physical fitness. This can cause shortness of breath when you exercise.   Chest or back injuries or stiffness.   Being overweight (obese).   Anxiety. This can make you feel like you are not getting enough air.  DIAGNOSIS  Serious medical problems can usually be found during your physical exam. Many tests may also be done to determine why you are having shortness of breath. Tests include:  Chest X-rays.   Lung function tests.   Blood tests.   Electrocardiography.   Exercise testing.   A cardiac echo.   Imaging scans.  Your caregiver may not be able to find a cause for your shortness of breath after your exam. In this case, it is important to have a follow-up exam with your caregiver as directed.  HOME CARE INSTRUCTIONS   Do not smoke. Smoking is a common cause of shortness of breath. Ask for help to stop smoking.   Avoid being around chemicals that may bother your breathing (paint fumes, dust).   Rest as needed. Slowly resume your usual activities.   If medicines were prescribed, take them as directed for the full length of time directed.  This includes oxygen and any inhaled medicines.   Follow up with your caregiver as directed. Waiting to do so or failure to follow up could result in worsening of your condition and possible disability or death.   Be sure you understand what to do or who to call if your shortness of breath worsens.  SEEK MEDICAL CARE IF:   Your condition does not improve in the time expected.   You have a hard time doing your normal activities even with rest.   You have any side effects or problems with the medicines prescribed.   You develop any new symptoms.  SEEK IMMEDIATE MEDICAL CARE IF:   Your shortness of breath is getting worse.   You feel lightheaded, faint, or develop a cough not controlled with medicines.   You start coughing up blood.   You have pain with breathing.   You have chest pain or pain in your arms, shoulders, or abdomen.   You have a fever.   You are unable to walk up stairs or exercise the way you normally do.   Your symptoms are getting worse.  Document Released: 06/17/2001 Document Revised: 09/11/2011 Document Reviewed: 02/02/2008 [facility] Patient Information 2012 [place], [place].

## 2012-02-27 NOTE — ED Notes (Signed)
Md at bedside

## 2012-02-27 NOTE — ED Notes (Signed)
States was dx with bilateral PEs

## 2012-02-27 NOTE — ED Notes (Signed)
Pt d/c home in NAD. Pt voiced understanding of d/c instructions and follow up care.  

## 2012-02-27 NOTE — Telephone Encounter (Signed)
Patient is calling because she has SOB, chest twinges, and a temp of 100.8.  She has a history of pulmonary embolism and was hospitalized.  Per Dr Caryl Never patient to go to the ER.  Patient aware and agreed verbally.

## 2012-02-27 NOTE — ED Provider Notes (Signed)
History     CSN: [id number]  Arrival date & time 02/27/12  1107   First MD Initiated Contact with Patient 02/27/12 1149      Chief Complaint  Patient presents with  . Chest Pain    (Consider location/radiation/quality/duration/timing/severity/associated sxs/prior treatment) HPI  Patient who was admitted to the hospital from end of April to beginning of May for PE (not elevated cardiac markers or cardiac disease, question error in initial triage note) presents to ER complaining of SOB x 24 hours stating "all the sudden I feel like a deep to take a deep breath, or catch my breath and when I do then I get a twinge of pain." patient states symptoms are fleeting and intermittent. She has been taking coumadin since leaving hospital and has been following up with PCP for INR check. She states she has had mild cough but denies fevers, chills, hemoptysis, recent illness, abdominal pain, n/v/d, lower extremity pain or swelling. Patient states that she learned after d/c from hospital that her family has a "clotting disorder" but she is unsure of name. She denies aggravating or alleviating factors.   Past Medical History  Diagnosis Date  . Depression   . History of blood clotting disorder   . Anemia   . Anxiety   . Allergy     seasonal  . Melanoma 2009    insitu  . Basal cell cancer   . Squamous cell carcinoma     Past Surgical History  Procedure Date  . Cesarean section   . Breast surgery 2009    L intraductal papilloma  . 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 Mohs surgery     Family History  Problem Relation Age of Onset  . Cancer Father 29    colon cancer  . Colon cancer Father   . Cancer Paternal Uncle     prostate    History  Substance Use Topics  . Smoking status: Never Smoker   . Smokeless tobacco: Never Used  . Alcohol Use: No    OB History    Grav Para Term Preterm Abortions TAB SAB Ect Mult Living                  Review of Systems  All other  systems reviewed and are negative.    Allergies  Penicillins  Home Medications   Current Outpatient Rx  Name Route Sig Dispense Refill  . CLONAZEPAM 1 MG PO TABS Oral Take 1 mg by mouth daily.    Marland Kitchen FERROUS SULFATE 325 (65 FE) MG PO TABS Oral Take 325 mg by mouth 3 (three) times daily with meals.    Marland Kitchen LAMOTRIGINE 50 MG PO TBDP Oral Take 1 tablet by mouth every morning.    . ADULT MULTIVITAMIN W/MINERALS CH Oral Take 2 tablets by mouth daily. Juice plus gummies    . SERTRALINE HCL 100 MG PO TABS Oral Take 100 mg by mouth every morning.    . WARFARIN SODIUM 10 MG PO TABS Oral Take 5 mg by mouth daily. See below      BP 110/68  Pulse 65  Temp(Src) 97.7 F (36.5 C) (Oral)  Resp 6  SpO2 97%  LMP 01/26/2012  Physical Exam  Nursing note and vitals reviewed. Constitutional: She is oriented to person, place, and time. She appears well-developed and well-nourished. No distress.  HENT:  Head: Normocephalic and atraumatic.  Eyes: Conjunctivae are normal.  Neck: Normal range of motion. Neck supple.  Cardiovascular: Normal rate, regular rhythm, normal heart sounds and intact distal pulses.  Exam reveals no gallop and no friction rub.   No murmur heard. Pulmonary/Chest: Effort normal and breath sounds normal. No respiratory distress. She has no wheezes. She has no rales. She exhibits no tenderness.  Abdominal: Bowel sounds are normal. She exhibits no distension and no mass. There is no tenderness. There is no rebound and no guarding.  Musculoskeletal: Normal range of motion. She exhibits no edema and no tenderness.  Neurological: She is alert and oriented to person, place, and time.  Skin: Skin is warm and dry. No rash noted. She is not diaphoretic. No erythema.  Psychiatric: She has a normal mood and affect.    ED Course  Procedures (including critical care time)  Labs Reviewed  PROTIME-INR - Abnormal; Notable for the following:    Prothrombin Time 24.9 (*)    INR 2.21 (*)    All  other components within normal limits  APTT - Abnormal; Notable for the following:    aPTT 39 (*)    All other components within normal limits  CBC - Abnormal; Notable for the following:    Hemoglobin 11.0 (*)    HCT 35.9 (*)    MCV 74.0 (*)    MCH 22.7 (*)    RDW 22.9 (*)    Platelets 518 (*)    All other components within normal limits  POCT I-STAT, CHEM 8 - Abnormal; Notable for the following:    BUN 5 (*)    All other components within normal limits  POCT I-STAT TROPONIN I   Dg Chest 2 View  02/27/2012  *RADIOLOGY REPORT*  Clinical Data: Shortness of breath and history of pulmonary embolism.  CHEST - 2 VIEW  Comparison: 02/02/2012  Findings: Two views of the chest were obtained.  The lungs are clear without focal airspace disease or edema.  Negative for pneumothorax. Heart and mediastinum are within normal limits.  The trachea is midline.  IMPRESSION: No acute chest findings.  Original Report Authenticated By: Richarda Overlie, M.D.    Date: 02/27/2012  Rate: 69  Rhythm: normal sinus rhythm  QRS Axis: normal  Intervals: normal  ST/T Wave abnormalities: normal  Conduction Disutrbances: none  Narrative Interpretation: non provocative.   Old EKG Reviewed: No significant changes noted   1. Shortness of breath     Dr. Ignacia Palma evaluated patient at bedside and agrees that she is appropriate for OP follow up with PCP given normal lung exam, chest xray and therapeutic INR with fleeting SOB and "twinges" of chest discomfort not consistent with ACS with non provocative EKG and negative troponin.   MDM   Dr. Ignacia Palma established follow up appointment with Dr. Caryl Never for early next week for re-evaluation.        Drucie Opitz, [place] 02/27/12 1311  [place], [place] 02/27/12 1314

## 2012-03-02 ENCOUNTER — Ambulatory Visit (INDEPENDENT_AMBULATORY_CARE_PROVIDER_SITE_OTHER): Payer: BC Managed Care – PPO | Admitting: Family Medicine

## 2012-03-02 ENCOUNTER — Encounter: Payer: BC Managed Care – PPO | Admitting: Family

## 2012-03-02 ENCOUNTER — Encounter: Payer: Self-pay | Admitting: Family Medicine

## 2012-03-02 VITALS — BP 90/60 | HR 76 | Temp 98.4°F | Resp 18 | Wt 210.0 lb

## 2012-03-02 DIAGNOSIS — I2699 Other pulmonary embolism without acute cor pulmonale: Secondary | ICD-10-CM

## 2012-03-02 DIAGNOSIS — R0989 Other specified symptoms and signs involving the circulatory and respiratory systems: Secondary | ICD-10-CM

## 2012-03-02 DIAGNOSIS — R0600 Dyspnea, unspecified: Secondary | ICD-10-CM

## 2012-03-02 NOTE — Progress Notes (Signed)
  Subjective:    Patient ID: Kimberly Monroe, female    DOB: 21-Jul-1961, 51 y.o.   MRN: [id number]  HPI  Patient seen for followup. Recent pulmonary emboli. She presented to the emergency department Friday with acute worsening shortness of breath (has had some dyspnea since her PE). She had minimal pain with deep breathing. No hemoptysis. Increased fatigue. Sensation of air hunger and difficulty getting a full deep breath. No true dyspnea with activity. She has had some mild subjective shortness of breath since her pulmonary embolus no recent acute change. No fever. No cough. Recent INR 2.21 in emergency department. She had several labs and chest x-ray there which were reviewed and all unremarkable. EKG unremarkable. Was felt some of her symptoms may be anxiety related. She or he takes Klonopin once daily and sertraline. Patient also on Lamictal.  Past Medical History  Diagnosis Date  . Depression   . History of blood clotting disorder   . Anemia   . Anxiety   . Allergy     seasonal  . Melanoma 2009    insitu  . Basal cell cancer   . Squamous cell carcinoma    Past Surgical History  Procedure Date  . Cesarean section   . Breast surgery 2009    L intraductal papilloma  . 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 Mohs surgery     reports that she has never smoked. She has never used smokeless tobacco. She reports that she does not drink alcohol or use illicit drugs. family history includes Cancer in her paternal uncle; Cancer (age of onset:70) in her father; and Colon cancer in her father. Allergies  Allergen Reactions  . Penicillins Other (See Comments)    Possibility-patient has never taken it      Review of Systems  Constitutional: Positive for fatigue. Negative for fever, appetite change and unexpected weight change.  Respiratory: Negative for cough.   Cardiovascular: Negative for palpitations and leg swelling.  Gastrointestinal: Negative for blood in stool.    Genitourinary: Negative for hematuria.  Neurological: Negative for syncope and headaches.       Objective:   Physical Exam  Constitutional: She appears well-developed and well-nourished.  HENT:  Mouth/Throat: Oropharynx is clear and moist.  Neck: Neck supple. No thyromegaly present.  Cardiovascular: Normal rate and regular rhythm.   Pulmonary/Chest: Effort normal and breath sounds normal. No respiratory distress. She has no wheezes. She has no rales.  Musculoskeletal: She exhibits no edema.  Lymphadenopathy:    She has no cervical adenopathy.          Assessment & Plan:  #1 history of pulmonary emboli. No evidence for recurrence. INR stable today and has been therapeutic for several weeks now.  Will consider hematology referral in few months regarding duration of coumadin therapy. #2 subjective sensation of air hunger. Pulse oximetry normal. Lung exam normal. Suspect more anxiety related. Consider increasing Klonopin to twice a day and followup with psychiatrist.

## 2012-03-02 NOTE — Patient Instructions (Signed)
  Latest dosing instructions   Total Glynis Smiles Tue Wed Thu Fri Sat   35 5 mg 5 mg 5 mg 5 mg 5 mg 5 mg 5 mg    (10 mg0.5) (10 mg0.5) (10 mg0.5) (10 mg0.5) (10 mg0.5) (10 mg0.5) (10 mg0.5)

## 2012-03-11 ENCOUNTER — Ambulatory Visit: Payer: BC Managed Care – PPO | Admitting: Family Medicine

## 2012-03-17 ENCOUNTER — Telehealth: Payer: Self-pay | Admitting: Family Medicine

## 2012-03-17 MED ORDER — WARFARIN SODIUM 10 MG PO TABS: 5.0000 mg | ORAL_TABLET | Freq: Every day | ORAL | Status: DC

## 2012-03-17 NOTE — Telephone Encounter (Signed)
Pt informed

## 2012-03-17 NOTE — Telephone Encounter (Signed)
Pt requesting refill on warfarin (COUMADIN) 10 MG tablet   [facility] Pt requesting to be called when called in

## 2012-03-23 ENCOUNTER — Ambulatory Visit (INDEPENDENT_AMBULATORY_CARE_PROVIDER_SITE_OTHER): Payer: BC Managed Care – PPO | Admitting: Family

## 2012-03-23 ENCOUNTER — Other Ambulatory Visit: Payer: Self-pay | Admitting: Family Medicine

## 2012-03-23 DIAGNOSIS — I2699 Other pulmonary embolism without acute cor pulmonale: Secondary | ICD-10-CM

## 2012-03-23 LAB — POCT INR: INR: 2.2

## 2012-03-23 NOTE — Patient Instructions (Addendum)
  Latest dosing instructions   Total Sun Mon Tue Wed Thu Fri Sat   35 5 mg 5 mg 5 mg 5 mg 5 mg 5 mg 5 mg    (10 mg0.5) (10 mg0.5) (10 mg0.5) (10 mg0.5) (10 mg0.5) (10 mg0.5) (10 mg0.5)      Continue 5 mg everyday. Recheck in 4 weeks.

## 2012-03-29 ENCOUNTER — Emergency Department (HOSPITAL_COMMUNITY)
Admission: EM | Admit: 2012-03-29 | Discharge: 2012-03-29 | Disposition: A | Discharge status: Home / Self Care | Payer: BC Managed Care – PPO | Attending: Emergency Medicine | Admitting: Emergency Medicine

## 2012-03-29 ENCOUNTER — Telehealth: Payer: Self-pay | Admitting: Family Medicine

## 2012-03-29 ENCOUNTER — Encounter: Payer: Self-pay | Admitting: Family Medicine

## 2012-03-29 ENCOUNTER — Encounter (HOSPITAL_COMMUNITY): Payer: Self-pay

## 2012-03-29 ENCOUNTER — Ambulatory Visit (INDEPENDENT_AMBULATORY_CARE_PROVIDER_SITE_OTHER): Payer: BC Managed Care – PPO | Admitting: Family Medicine

## 2012-03-29 ENCOUNTER — Emergency Department (HOSPITAL_COMMUNITY): Payer: BC Managed Care – PPO

## 2012-03-29 VITALS — BP 102/70 | HR 80 | Temp 98.7°F | Resp 14

## 2012-03-29 DIAGNOSIS — R202 Paresthesia of skin: Secondary | ICD-10-CM

## 2012-03-29 DIAGNOSIS — Z85820 Personal history of malignant melanoma of skin: Secondary | ICD-10-CM | POA: Insufficient documentation

## 2012-03-29 DIAGNOSIS — Z86711 Personal history of pulmonary embolism: Secondary | ICD-10-CM | POA: Insufficient documentation

## 2012-03-29 DIAGNOSIS — R064 Hyperventilation: Secondary | ICD-10-CM | POA: Insufficient documentation

## 2012-03-29 DIAGNOSIS — F411 Generalized anxiety disorder: Secondary | ICD-10-CM | POA: Insufficient documentation

## 2012-03-29 DIAGNOSIS — R209 Unspecified disturbances of skin sensation: Secondary | ICD-10-CM | POA: Insufficient documentation

## 2012-03-29 DIAGNOSIS — Z79899 Other long term (current) drug therapy: Secondary | ICD-10-CM | POA: Insufficient documentation

## 2012-03-29 DIAGNOSIS — R0602 Shortness of breath: Secondary | ICD-10-CM

## 2012-03-29 DIAGNOSIS — R42 Dizziness and giddiness: Secondary | ICD-10-CM | POA: Insufficient documentation

## 2012-03-29 HISTORY — DX: Other pulmonary embolism without acute cor pulmonale: I26.99

## 2012-03-29 LAB — PROTIME-INR
INR: 2.79 — ABNORMAL HIGH (ref 0.00–1.49)
Prothrombin Time: 29.9 seconds — ABNORMAL HIGH (ref 11.6–15.2)

## 2012-03-29 NOTE — ED Notes (Signed)
Pt. Reports intermittent SOB. States "I'll be breathing normal but then I'll start breathing fast and have to take a deep breath to catch my breath". Hx of bilateral PE. Pt. Also reports feeling slight pressure on upper chest and dizziness with ambulation at doctor's office. Currently denies pain, dizziness, or difficulty breathing.

## 2012-03-29 NOTE — ED Provider Notes (Signed)
I saw and evaluated the patient, reviewed the resident's note and I agree with the findings and plan. Agree with EKG interpretation.   Pt with recent PE, has had some dizziness. No change in pain. INR is therapeutic.   Benjimin Hadden B. Bernette Mayers, MD 03/29/12 [phone]

## 2012-03-29 NOTE — Telephone Encounter (Signed)
Caller: Marae/Mother; PCP: Evelena Peat; CB#: [phone]; ; ; Call regarding Had Previous Pulmonary Emboli and Now Has Knot On Right Side of Rib;  Pt calling c/o SOB and lightheadedness now, heart flutters all day yesterday, and a hx of Pulmonary Embolism. Pt also c/o knot that "just came up on right side". Pt sitting in parking lot with daughter. Asked pt to walk into clinic; called to give [place] or [place] a heads up; spoke with [place]. States pt is in the building and they will take it from here.

## 2012-03-29 NOTE — Progress Notes (Signed)
Subjective:    Patient ID: Kimberly Monroe, female    DOB: 1961-09-13, 51 y.o.   MRN: [id number]  HPI  Patient is seen with shortness of breath. History of pulmonary embolus 2 months ago. She has remained on Coumadin with recent INR 2.2. She had similar episode (of dyspnea) back in May and went to emergency room. Chest x-ray then unremarkable. She had fluttering sensation off and on yesterday but denies any tachycardia.  Pulse yesterday during fluttering episode around 55-60.  She denies any chest pain. No cough. No fever. No hemoptysis. She has sensation of air hunger and difficulty getting a deep breath. No wheezing. Minimal pleuritic pain. Nonsmoker.  No recent exertional chest pain.  She has history of some anxiety and takes sertraline and Klonopin. Denies feeling overly anxious recently.  Past Medical History  Diagnosis Date  . Depression   . History of blood clotting disorder   . Anemia   . Anxiety   . Allergy     seasonal  . Melanoma 2009    insitu  . Basal cell cancer   . Squamous cell carcinoma    Past Surgical History  Procedure Date  . Cesarean section   . Breast surgery 2009    L intraductal papilloma  . 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 Mohs surgery     reports that she has never smoked. She has never used smokeless tobacco. She reports that she does not drink alcohol or use illicit drugs. family history includes Cancer in her paternal uncle; Cancer (age of onset:70) in her father; and Colon cancer in her father. Allergies  Allergen Reactions  . Penicillins Other (See Comments)    Possibility-patient has never taken it      Review of Systems  Constitutional: Negative for fever, chills and unexpected weight change.  HENT: Negative for sore throat and trouble swallowing.   Respiratory: Positive for shortness of breath. Negative for cough and wheezing.   Cardiovascular: Negative for palpitations and leg swelling.  Gastrointestinal: Negative for  abdominal pain and blood in stool.  Neurological: Negative for dizziness and syncope.  Hematological: Negative for adenopathy. Does not bruise/bleed easily.       Objective:   Physical Exam  Constitutional: She appears well-developed and well-nourished.  HENT:  Right Ear: External ear normal.  Left Ear: External ear normal.  Mouth/Throat: Oropharynx is clear and moist.  Neck: Neck supple. No thyromegaly present.  Cardiovascular: Normal rate and regular rhythm.   Pulmonary/Chest: Effort normal and breath sounds normal. No respiratory distress. She has no wheezes. She has no rales.  Abdominal:       Patient has small mobile 1 cm cystic type lesion below the right breast lower chest wall. No overlying skin changes  Musculoskeletal: She exhibits no edema.  Lymphadenopathy:    She has no cervical adenopathy.          Assessment & Plan:  #1 dyspnea. Patient has subjective dyspnea with pulse oximetry 98% and normal exam. History of recent pulmonary emboli stable on Coumadin. Doubt cardiac but will check EKG.  #2 small cystic type lesion below the right breast. Question sebaceous cyst versus panniculitis  EKG reveals heart rate around 55 with no acute findings. We were attempting spirometry to evaluate her dyspnea and patient developed increased dizziness and tingling sensation and numbness throughout the face and extremities. No focal weakness. She was unable to stand to transfer to table. Repeat blood pressure stable and pulse oximetry  remains 98%. Pulse is 55-60 and regular. EMS summoned to transport patient to emergency room for further evaluation.  Blood glucose 75. Pt also c/o some "fullness" anterior chest after attempted spirometry.

## 2012-03-29 NOTE — ED Notes (Signed)
Per EMS, pt. Was at doctor's office when she had onset of SOB. Pt also c/o dizziness that is worse with standing/ambulation. Hx of bilateral PE. Denies pain.

## 2012-03-29 NOTE — ED Provider Notes (Signed)
History     CSN: [id number]  Arrival date & time 03/29/12  2841   First MD Initiated Contact with Patient 03/29/12 1849      Chief Complaint  Patient presents with  . Shortness of Breath    (Consider location/radiation/quality/duration/timing/severity/associated sxs/prior treatment) HPI  51 year old female past medical history of bilateral pulmonary emboli diagnosed approximately 2 months ago. Patient was undergoing pulmonary function test today, and after her deep breathing and maximum expiration, she became lightheaded, had perioral numbness and paresthesias, bilateral upper extremity numbness and paresthesias, and also intermittent hypertension. She was sent to the emergency department for that reason. Her last PT/INR was checked approximately one week ago and has been in range recently. She endorses ongoing shortness of breath intermittently since her PE was diagnosed. She denies however any new chest pain, any dyspnea with exertion. She arrived with normal vital signs. She calls her illness moderate. Nothing makes better nothing makes it worse.   Past Medical History  Diagnosis Date  . Depression   . History of blood clotting disorder   . Anemia   . Anxiety   . Allergy     seasonal  . Melanoma 2009    insitu  . Basal cell cancer   . Squamous cell carcinoma   . Pulmonary embolism     Past Surgical History  Procedure Date  . Cesarean section   . Breast surgery 2009    L intraductal papilloma  . 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 Mohs surgery     Family History  Problem Relation Age of Onset  . Cancer Father 50    colon cancer  . Colon cancer Father   . Cancer Paternal Uncle     prostate    History  Substance Use Topics  . Smoking status: Never Smoker   . Smokeless tobacco: Never Used  . Alcohol Use: No    OB History    Grav Para Term Preterm Abortions TAB SAB Ect Mult Living                  Review of Systems Constitutional: Negative  for fever and chills.  HENT: Negative for ear pain, sore throat and trouble swallowing.   Eyes: Negative for pain and visual disturbance.  Respiratory: Negative for cough and shortness of breath.   Cardiovascular: Negative for chest pain and leg swelling.  Gastrointestinal: Negative for nausea, vomiting, abdominal pain and diarrhea.  Genitourinary: Negative for dysuria, urgency and frequency.  Musculoskeletal: Negative for back pain and joint swelling.  Skin: Negative for rash and wound.  Neurological: Negative for POS dizziness, neg syncope, speech difficulty, weakness and POS numbness.   Allergies  Penicillins  Home Medications   Current Outpatient Rx  Name Route Sig Dispense Refill  . CLONAZEPAM 1 MG PO TABS Oral Take 1 mg by mouth daily.    Marland Kitchen DOCUSATE SODIUM 100 MG PO CAPS Oral Take 100 mg by mouth 2 (two) times daily.    Marland Kitchen FERROUS SULFATE 325 (65 FE) MG PO TABS Oral Take 325 mg by mouth 3 (three) times daily with meals.    Marland Kitchen LAMOTRIGINE 50 MG PO TBDP Oral Take 1 tablet by mouth daily.    Marland Kitchen OVER THE COUNTER MEDICATION Oral Take 2 each by mouth daily. Over the Counter Multivitamin JUICE PLUS GUMMY MULTIVITAMIN.    . SERTRALINE HCL 100 MG PO TABS Oral Take 100 mg by mouth every morning.    . WARFARIN SODIUM  10 MG PO TABS Oral Take 5 mg by mouth daily.      BP 132/60  Pulse 64  Temp 97.8 F (36.6 C) (Oral)  Resp 20  SpO2 98%  Physical Exam Consitutional: Pt in no acute distress.   Head: Normocephalic and atraumatic.  Eyes: Extraocular motion intact, no scleral icterus Neck: Supple without meningismus, mass, or overt JVD Respiratory: Effort normal and breath sounds normal. No respiratory distress. CV: Heart regular rate and regular rhythm (sinus), no obvious murmurs.  Pulses +2 and symmetric Abdomen: Soft, non-tender, non-distended. No rebound or guarding.  MSK: Extremities are atraumatic without deformity, ROM intact Skin: Warm, dry, intact Neuro: Alert and oriented, no  motor deficit noted.   Psychiatric: Mood and affect are normal  EKG:  Rate: 55 Rythym Sinus  Interval 168  ms. Axis: normal No gross conduction abnormalities appreciated.  No gross ST or T-wave abnormalities appreciated.  q waves V1, V2, old.  Essentially unchanged.    ED Course  Procedures (including critical care time)  Labs Reviewed  PROTIME-INR - Abnormal; Notable for the following:    Prothrombin Time 29.9 (*)  SLIGHT HEMOLYSIS   INR 2.79 (*)     All other components within normal limits   Dg Chest 2 View  03/29/2012  *RADIOLOGY REPORT*  Clinical Data: Shortness of breath  CHEST - 2 VIEW  Comparison:  02/27/2012  Findings:  The heart size and mediastinal contours are within normal limits.  Both lungs are clear.  The visualized skeletal structures are unremarkable.  IMPRESSION: No active cardiopulmonary disease.  Original Report Authenticated By: Judie Petit. Ruel Favors, M.D.     1. Paresthesias   2. Hyperventilation       MDM  The patient has an excellent story for hyperventilation with resultant paresthesias and perioral numbness.   This patient arrives without any objective evidence of progression of her disease. Our main concern today will be to clarify that she is within therapeutic range. That being the case, any new events or any new clots would certainly be hemodynamically insignificant. Will complete chest x-ray, screening EKG, and clarify the PT/INR the patient. Otherwise the patient looks well exam and is asymptomatic at this time. Her vital signs are normal at this time, 100% SpO2 on room air, and a normal heart rate.  The patient INR is therapeutic. Her vital signs were normal. She has no hypoxia. She has no chest pain. She has no shortness of breath. This is not consistent with progression of her embolic disease. Patient discharged home to followup with her primary care.  PT DC home stable.  Discussed with pt the clinical impression, treatment in the ED, and follow up  plan.  We alslo discussed the indications for returning to the ED, which include shortness or breath, confusion, fever, new weakness or numbness, chest pain, or any other concerning symptom.  The pt understood the treatment and plan, is stable, and is able to leave the ED.           Larrie Kass, MD 03/30/12 [phone]

## 2012-03-29 NOTE — Discharge Instructions (Signed)
Follow up with your doctor.  Your coumadin level is 2.7  See your doctor immediately--or return to the ED--with any new or troubling symptoms including fevers, weakness, new chest pain, shortness or breath, numbness, or any other concerning symptom.

## 2012-03-29 NOTE — Telephone Encounter (Signed)
Patient presented to [facility] as a walk in. She is being Triaged by nurse right now.

## 2012-04-20 ENCOUNTER — Ambulatory Visit (INDEPENDENT_AMBULATORY_CARE_PROVIDER_SITE_OTHER): Payer: BC Managed Care – PPO | Admitting: Family

## 2012-04-20 DIAGNOSIS — I4891 Unspecified atrial fibrillation: Secondary | ICD-10-CM

## 2012-04-20 DIAGNOSIS — Z7901 Long term (current) use of anticoagulants: Secondary | ICD-10-CM

## 2012-04-20 DIAGNOSIS — Z5181 Encounter for therapeutic drug level monitoring: Secondary | ICD-10-CM

## 2012-04-20 LAB — POCT INR: INR: 1.5

## 2012-04-20 NOTE — Patient Instructions (Signed)
Take 1 tablet today and tomorrow. Then continue 1/2 tablet a day.     Latest dosing instructions   Total Glynis Smiles Tue Wed Thu Fri Sat   35 5 mg 5 mg 5 mg 5 mg 5 mg 5 mg 5 mg    (10 mg0.5) (10 mg0.5) (10 mg0.5) (10 mg0.5) (10 mg0.5) (10 mg0.5) (10 mg0.5)

## 2012-05-06 ENCOUNTER — Ambulatory Visit (INDEPENDENT_AMBULATORY_CARE_PROVIDER_SITE_OTHER): Payer: BC Managed Care – PPO | Admitting: Family

## 2012-05-06 DIAGNOSIS — I2699 Other pulmonary embolism without acute cor pulmonale: Secondary | ICD-10-CM

## 2012-05-06 LAB — POCT INR: INR: 1.6

## 2012-05-06 MED ORDER — WARFARIN SODIUM 10 MG PO TABS: 10.0000 mg | ORAL_TABLET | Freq: Every day | ORAL | Status: DC

## 2012-05-06 NOTE — Patient Instructions (Addendum)
Take 1 whole tab today and tomorrow. Then continue 1/2 tablet a day. Except Wednesdays 1 whole tab.     Latest dosing instructions   Total Glynis Smiles Tue Wed Thu Fri Sat   40 5 mg 5 mg 5 mg 10 mg 5 mg 5 mg 5 mg    (10 mg0.5) (10 mg0.5) (10 mg0.5) (10 mg1) (10 mg0.5) (10 mg0.5) (10 mg0.5)

## 2012-05-19 ENCOUNTER — Ambulatory Visit (INDEPENDENT_AMBULATORY_CARE_PROVIDER_SITE_OTHER): Payer: BC Managed Care – PPO | Admitting: Family

## 2012-05-19 DIAGNOSIS — I2699 Other pulmonary embolism without acute cor pulmonale: Secondary | ICD-10-CM

## 2012-05-19 NOTE — Patient Instructions (Addendum)
Then continue 1/2 tablet a day. Except Wednesdays 1 whole tab. Recheck in 4 weeks.    Latest dosing instructions   Total Glynis Smiles Tue Wed Thu Fri Sat   40 5 mg 5 mg 5 mg 10 mg 5 mg 5 mg 5 mg    (10 mg0.5) (10 mg0.5) (10 mg0.5) (10 mg1) (10 mg0.5) (10 mg0.5) (10 mg0.5)

## 2012-05-20 ENCOUNTER — Encounter: Payer: BC Managed Care – PPO | Admitting: Family

## 2012-06-17 ENCOUNTER — Telehealth: Payer: Self-pay | Admitting: Family Medicine

## 2012-06-17 ENCOUNTER — Ambulatory Visit (INDEPENDENT_AMBULATORY_CARE_PROVIDER_SITE_OTHER): Payer: BC Managed Care – PPO | Admitting: Family

## 2012-06-17 DIAGNOSIS — I2699 Other pulmonary embolism without acute cor pulmonale: Secondary | ICD-10-CM

## 2012-06-17 LAB — POCT INR: INR: 1.5

## 2012-06-17 NOTE — Telephone Encounter (Signed)
Caller: Forrestine/Patient; Phone: [phone]; Reason for Call: Patient spoke with someone in the [facility] earlier.  Calling to speak with them again regarding a medication she has not taken due to her coumadin level being off.  Please call her back.  Thanks

## 2012-06-17 NOTE — Patient Instructions (Addendum)
Today take 1 whole tab. 1/2 tab (5mg ) a day, Except Monday and Wednesday 1 whole tab.   Recheck in 2 weeks.     Latest dosing instructions   Total Glynis Smiles Tue Wed Thu Fri Sat   45 5 mg 10 mg 5 mg 10 mg 5 mg 5 mg 5 mg    (10 mg0.5) (10 mg1) (10 mg0.5) (10 mg1) (10 mg0.5) (10 mg0.5) (10 mg0.5)

## 2012-06-17 NOTE — Telephone Encounter (Signed)
Spoke with pt about colace possibly affecting her pt level. Per Oran Rein, notify pt that not taking colace will not have an affect on her pt level. Pt aware and verbalized understanding

## 2012-06-18 ENCOUNTER — Emergency Department (HOSPITAL_BASED_OUTPATIENT_CLINIC_OR_DEPARTMENT_OTHER): Payer: BC Managed Care – PPO

## 2012-06-18 ENCOUNTER — Telehealth: Payer: Self-pay | Admitting: Family Medicine

## 2012-06-18 ENCOUNTER — Emergency Department (HOSPITAL_BASED_OUTPATIENT_CLINIC_OR_DEPARTMENT_OTHER)
Admission: EM | Admit: 2012-06-18 | Discharge: 2012-06-18 | Disposition: A | Discharge status: Home / Self Care | Payer: BC Managed Care – PPO | Attending: Emergency Medicine | Admitting: Emergency Medicine

## 2012-06-18 ENCOUNTER — Encounter (HOSPITAL_BASED_OUTPATIENT_CLINIC_OR_DEPARTMENT_OTHER): Payer: Self-pay | Admitting: Emergency Medicine

## 2012-06-18 DIAGNOSIS — F329 Major depressive disorder, single episode, unspecified: Secondary | ICD-10-CM | POA: Insufficient documentation

## 2012-06-18 DIAGNOSIS — F411 Generalized anxiety disorder: Secondary | ICD-10-CM | POA: Insufficient documentation

## 2012-06-18 DIAGNOSIS — F3289 Other specified depressive episodes: Secondary | ICD-10-CM | POA: Insufficient documentation

## 2012-06-18 DIAGNOSIS — Z85820 Personal history of malignant melanoma of skin: Secondary | ICD-10-CM | POA: Insufficient documentation

## 2012-06-18 DIAGNOSIS — Z86711 Personal history of pulmonary embolism: Secondary | ICD-10-CM | POA: Insufficient documentation

## 2012-06-18 DIAGNOSIS — R071 Chest pain on breathing: Secondary | ICD-10-CM | POA: Insufficient documentation

## 2012-06-18 DIAGNOSIS — Z7901 Long term (current) use of anticoagulants: Secondary | ICD-10-CM | POA: Insufficient documentation

## 2012-06-18 DIAGNOSIS — R0781 Pleurodynia: Secondary | ICD-10-CM

## 2012-06-18 DIAGNOSIS — Z880 Allergy status to penicillin: Secondary | ICD-10-CM | POA: Insufficient documentation

## 2012-06-18 DIAGNOSIS — R791 Abnormal coagulation profile: Secondary | ICD-10-CM

## 2012-06-18 LAB — CBC WITH DIFFERENTIAL/PLATELET
Basophils Absolute: 0.1 10*3/uL (ref 0.0–0.1)
HCT: 32.5 % — ABNORMAL LOW (ref 36.0–46.0)
Hemoglobin: 10.6 g/dL — ABNORMAL LOW (ref 12.0–15.0)
Lymphocytes Relative: 31 % (ref 12–46)
Lymphs Abs: 2.6 10*3/uL (ref 0.7–4.0)
MCV: 80.2 fL (ref 78.0–100.0)
Monocytes Absolute: 0.7 10*3/uL (ref 0.1–1.0)
Neutro Abs: 4.8 10*3/uL (ref 1.7–7.7)
Platelets: 412 10*3/uL — ABNORMAL HIGH (ref 150–400)
RBC: 4.05 MIL/uL (ref 3.87–5.11)
RDW: 14.9 % (ref 11.5–15.5)
WBC: 8.4 10*3/uL (ref 4.0–10.5)

## 2012-06-18 LAB — BASIC METABOLIC PANEL
BUN: 12 mg/dL (ref 6–23)
CO2: 28 mEq/L (ref 19–32)
Chloride: 100 mEq/L (ref 96–112)
Creatinine, Ser: 0.9 mg/dL (ref 0.50–1.10)
GFR calc non Af Amer: 73 mL/min — ABNORMAL LOW (ref >90)
Glucose, Bld: 113 mg/dL — ABNORMAL HIGH (ref 70–99)
Sodium: 138 mEq/L (ref 135–145)

## 2012-06-18 MED ORDER — ENOXAPARIN SODIUM 100 MG/ML ~~LOC~~ SOLN
95.0000 mg | Freq: Once | SUBCUTANEOUS | Status: AC
  Administered 2012-06-18: 95 mg via SUBCUTANEOUS
  Filled 2012-06-18: qty 1

## 2012-06-18 MED ORDER — IOHEXOL 350 MG/ML SOLN
80.0000 mL | Freq: Once | INTRAVENOUS | Status: AC | PRN
  Administered 2012-06-18: 80 mL via INTRAVENOUS

## 2012-06-18 MED ORDER — SODIUM CHLORIDE 0.9 % IV SOLN
INTRAVENOUS | Status: DC
  Administered 2012-06-18: 05:00:00 via INTRAVENOUS

## 2012-06-18 NOTE — Telephone Encounter (Signed)
Per Oran Rein, pt can continue the lovenox inj along with coumadin and schedule pt/inr for 06/23/12.  Pt aware, verbalized understanding and appt made

## 2012-06-18 NOTE — ED Provider Notes (Signed)
History     CSN: [id number]  Arrival date & time 06/18/12  0413   First MD Initiated Contact with Patient 06/18/12 [phone]      Chief Complaint  Patient presents with  . Chest Pain    (Consider location/radiation/quality/duration/timing/severity/associated sxs/prior treatment) HPI This is a 51 year old white female with a history of coagulopathy and bilateral pulmonary emboli. She's been on Coumadin since April of this year. She is here with chest pain that began yesterday evening about 6 PM. The pain is pleuritic, burning in nature, moderate in severity and located below the right breast and around to the right side. It is worse with deep breaths. It is similar to the pain she had with her pulmonary embolism. She saw her PCP yesterday and her INR was 1.5. She took an extra 10 mg of Coumadin. There is some associated dyspnea which is not constant. She denies fever, chills, nausea or vomiting.  Past Medical History  Diagnosis Date  . Depression   . History of blood clotting disorder   . Anemia   . Anxiety   . Allergy     seasonal  . Melanoma 2009    insitu  . Basal cell cancer   . Squamous cell carcinoma   . Pulmonary embolism     Past Surgical History  Procedure Date  . Cesarean section   . Breast surgery 2009    L intraductal papilloma  . 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 Mohs surgery     Family History  Problem Relation Age of Onset  . Cancer Father 76    colon cancer  . Colon cancer Father   . Cancer Paternal Uncle     prostate    History  Substance Use Topics  . Smoking status: Never Smoker   . Smokeless tobacco: Never Used  . Alcohol Use: No    OB History    Grav Para Term Preterm Abortions TAB SAB Ect Mult Living                  Review of Systems  All other systems reviewed and are negative.    Allergies  Penicillins  Home Medications   Current Outpatient Rx  Name Route Sig Dispense Refill  . CLONAZEPAM 1 MG PO TABS Oral  Take 1 mg by mouth daily.    Marland Kitchen DOCUSATE SODIUM 100 MG PO CAPS Oral Take 100 mg by mouth 2 (two) times daily.    Marland Kitchen FERROUS SULFATE 325 (65 FE) MG PO TABS Oral Take 325 mg by mouth 3 (three) times daily with meals.    Marland Kitchen LAMOTRIGINE 50 MG PO TBDP Oral Take 1 tablet by mouth daily.    Marland Kitchen OVER THE COUNTER MEDICATION Oral Take 2 each by mouth daily. Over the Counter Multivitamin JUICE PLUS GUMMY MULTIVITAMIN.    . SERTRALINE HCL 100 MG PO TABS Oral Take 100 mg by mouth every morning.    . WARFARIN SODIUM 10 MG PO TABS Oral Take 1 tablet (10 mg total) by mouth daily. 30 tablet 2    BP 139/87  Pulse 55  Temp 97.6 F (36.4 C) (Oral)  Resp 12  SpO2 100%  LMP 06/01/2012  Physical Exam General: Well-developed, well-nourished female in no acute distress; appearance consistent with age of record HENT: normocephalic, atraumatic Eyes: pupils equal round and reactive to light; extraocular muscles intact Neck: supple Heart: regular rate and rhythm Lungs: clear to auscultation bilaterally Chest: Nontender Abdomen: soft; nondistended;  nontender; bowel sounds present Extremities: No deformity; full range of motion; pulses normal; nontender; no edema Neurologic: Awake, alert and oriented; motor function intact in all extremities and symmetric; no facial droop Skin: Warm and dry Psychiatric: Normal mood and affect    ED Course  Procedures (including critical care time)     MDM   Nursing notes and vitals signs, including pulse oximetry, reviewed.  Summary of this visit's results, reviewed by myself:  Labs:  Results for orders placed during the hospital encounter of 06/18/12  PROTIME-INR      Component Value Range   Prothrombin Time 16.2 (*) 11.6 - 15.2 seconds   INR 1.27  0.00 - 1.49  TROPONIN I      Component Value Range   Troponin I <0.30  <0.30 ng/mL  CBC WITH DIFFERENTIAL      Component Value Range   WBC 8.4  4.0 - 10.5 K/uL   RBC 4.05  3.87 - 5.11 MIL/uL   Hemoglobin 10.6 (*)  12.0 - 15.0 g/dL   HCT 47.8 (*) 29.5 - 62.1 %   MCV 80.2  78.0 - 100.0 fL   MCH 26.2  26.0 - 34.0 pg   MCHC 32.6  30.0 - 36.0 g/dL   RDW 30.8  65.7 - 84.6 %   Platelets 412 (*) 150 - 400 K/uL   Neutrophils Relative 57  43 - 77 %   Neutro Abs 4.8  1.7 - 7.7 K/uL   Lymphocytes Relative 31  12 - 46 %   Lymphs Abs 2.6  0.7 - 4.0 K/uL   Monocytes Relative 8  3 - 12 %   Monocytes Absolute 0.7  0.1 - 1.0 K/uL   Eosinophils Relative 3  0 - 5 %   Eosinophils Absolute 0.3  0.0 - 0.7 K/uL   Basophils Relative 1  0 - 1 %   Basophils Absolute 0.1  0.0 - 0.1 K/uL  BASIC METABOLIC PANEL      Component Value Range   Sodium 138  135 - 145 mEq/L   Potassium 3.5  3.5 - 5.1 mEq/L   Chloride 100  96 - 112 mEq/L   CO2 28  19 - 32 mEq/L   Glucose, Bld 113 (*) 70 - 99 mg/dL   BUN 12  6 - 23 mg/dL   Creatinine, Ser 9.62  0.50 - 1.10 mg/dL   Calcium 9.0  8.4 - 95.2 mg/dL   GFR calc non Af Amer 73 (*) >90 mL/min   GFR calc Af Amer 84 (*) >90 mL/min    Imaging Studies: Ct Angio Chest Pe W/cm &/or Wo Cm  06/18/2012  *RADIOLOGY REPORT*  Clinical Data: Chest pain.  History of previous PE.  History of ovarian cancer and squamous cell carcinoma.  CT ANGIOGRAPHY CHEST  Technique:  Multidetector CT imaging of the chest using the standard protocol during bolus administration of intravenous contrast. Multiplanar reconstructed images including MIPs were obtained and reviewed to evaluate the vascular anatomy.  Contrast: 80mL OMNIPAQUE IOHEXOL 350 MG/ML SOLN  Comparison: 02/02/2012  Findings: Technically adequate study with good opacification of the central and segmental pulmonary arteries.  No focal filling defects.  No evidence of significant pulmonary embolus.  Emboli seen on the previous study have resolved.  Normal heart size.  Normal caliber thoracic aorta without dissection.  No significant lymphadenopathy in the chest.  No pleural effusions.  Visualized portions of the upper abdominal organs are unremarkable.   Esophagus is decompressed.  Dependent atelectasis in  the lung bases.  Minimal scattered emphysematous change.  No focal airspace consolidation.  No significant interstitial changes.  Airways appear patent.  Normal alignment of the thoracic vertebra with minimal degenerative change.  IMPRESSION: No evidence of significant pulmonary embolus.  Previous pulmonary emboli have resolved.   Original Report Authenticated By: Marlon Pel, M.D.       EKG Interpretation:  Date & Time: 06/18/2012 4:30 AM  Rate: 55  Rhythm: sinus bradycardia  QRS Axis: normal  Intervals: normal  ST/T Wave abnormalities: normal  Conduction Disutrbances:none  Narrative Interpretation:   Old EKG Reviewed: unchanged  5:42 AM Patient advised of lab and CT findings. Due to the very subtherapeutic INR we will give Lovenox in the ED. She was advised to call her PCPs office today for further evaluation and treatment.      Hanley Seamen, MD 06/18/12 [phone]

## 2012-06-18 NOTE — Telephone Encounter (Signed)
Caller: Sonnia/Patient; Patient Name: Kimberly Monroe; PCP: Evelena Peat [facility]); Best Callback Phone Number: [phone].  Patient calling about INR 1.27.  Seen in office 06/17/12 and INR was 1.5.  States got chest pain after that visit and when in ED overnight 06/17/12, INR had dropped to 1.27.  States given Lovenox injection in ED, sent home 06/18/12 0500, and was told to follow up with office regarding using Lovenox again at home.  Has leftover lovenox at home and is happy to use it instead of coumadiin if necessary.  Info to office for provider review/Rx coumadin dosing/callback. May reach patient at [phone].

## 2012-06-18 NOTE — Telephone Encounter (Signed)
Regarding coumadin dosing. Thanks!

## 2012-06-18 NOTE — ED Notes (Signed)
Pt with history of pe in April, currently on coumadin, saw md today pt/inr not therapeutic, awoke with burning right sided chest pain

## 2012-06-23 ENCOUNTER — Ambulatory Visit (INDEPENDENT_AMBULATORY_CARE_PROVIDER_SITE_OTHER): Payer: BC Managed Care – PPO | Admitting: Family

## 2012-06-23 DIAGNOSIS — I2699 Other pulmonary embolism without acute cor pulmonale: Secondary | ICD-10-CM

## 2012-06-23 LAB — POCT INR: INR: 1.7

## 2012-06-23 NOTE — Patient Instructions (Addendum)
1/2 tab (5mg ) a day, Except Monday and Wednesday 1 whole tab.   Recheck in 2 weeks.    Latest dosing instructions   Total Glynis Smiles Tue Wed Thu Fri Sat   45 5 mg 10 mg 5 mg 10 mg 5 mg 5 mg 5 mg    (10 mg0.5) (10 mg1) (10 mg0.5) (10 mg1) (10 mg0.5) (10 mg0.5) (10 mg0.5)

## 2012-07-01 ENCOUNTER — Encounter: Payer: BC Managed Care – PPO | Admitting: Family

## 2012-07-08 ENCOUNTER — Ambulatory Visit (INDEPENDENT_AMBULATORY_CARE_PROVIDER_SITE_OTHER): Payer: BC Managed Care – PPO | Admitting: Family

## 2012-07-08 DIAGNOSIS — I2699 Other pulmonary embolism without acute cor pulmonale: Secondary | ICD-10-CM

## 2012-07-08 LAB — POCT INR: INR: 1.4

## 2012-07-08 NOTE — Patient Instructions (Addendum)
Today and tomorrow take 1 whole tablet. Then increase dose to Tuesday, Thursday, and sat 1/2 tab (5mg ). All other days, 1 tab (10 mg).     Latest dosing instructions   Total Glynis Smiles Tue Wed Thu Fri Sat   55 10 mg 10 mg 5 mg 10 mg 5 mg 10 mg 5 mg    (10 mg1) (10 mg1) (10 mg0.5) (10 mg1) (10 mg0.5) (10 mg1) (10 mg0.5)

## 2012-07-09 ENCOUNTER — Telehealth: Payer: Self-pay | Admitting: Family Medicine

## 2012-07-09 MED ORDER — ENOXAPARIN SODIUM 100 MG/ML ~~LOC~~ SOLN: 100.0000 mg | Freq: Two times a day (BID) | SUBCUTANEOUS | Status: DC

## 2012-07-09 NOTE — Telephone Encounter (Signed)
Pt aware and verbalized understanding.  

## 2012-07-09 NOTE — Telephone Encounter (Signed)
Spoke w/ patient. She was waiting to hear from [place], and there is an additional CAN note in the chart about same issue dated 10/4. Passed info to [facility] - no appt made for pt at this time. Oran Rein will call pt to address pt/INR levels and proceed from there.

## 2012-07-09 NOTE — Telephone Encounter (Signed)
Appointment scheduled.

## 2012-07-09 NOTE — Telephone Encounter (Signed)
Caller: [name]/Patient; Patient Name: Kimberly Monroe; PCP: Evelena Peat [facility]); Best Callback Phone Number: [phone]. Was in to Coumadin Clinic yesterday and had Coumadin level tested- INR value 1.4. She is calling today to see if she needs to do Lovenox injections until her value improves since it has been below goal for some time.  She notes that she mentioned in clinic yesterday she as having knee pain since 07/07/12 that radiates to calf.  No signs of redness, heat, or hardness.  Rates pain as a discomfort versus actual pain..  Denies any bleeding concerns.  Triaged using Knee non-injury with a disposition of home care with care advice given.  Caller demonstrated understanding. Also stated was given and understood new Coumadin dosing information she was given yesterday.  She is requesting to hear back regarding the need for the Lovenox injections due to her sub-therapeutic level and the weekend approaching.    OFFICE:  PLEASE FOLLOW UP WITH PATIENT REGARDING HER QUESTION ABOUT USING THE LOVENOX INJECTIONS. THANKS

## 2012-07-09 NOTE — Telephone Encounter (Signed)
Please advise I have faxed Lovenox injections to the pharmacy. She can start today. Recheck INR Monday. Please scheduled

## 2012-07-09 NOTE — Telephone Encounter (Signed)
Pt called and is concerned re: low coumadin lvl. Pt is wondering if she can get lovenox injections? Pls call asap today.

## 2012-07-09 NOTE — Telephone Encounter (Signed)
Pt encouraged to come for OV to discuss her concerns, pt is scheduling OV

## 2012-07-12 ENCOUNTER — Telehealth: Payer: Self-pay | Admitting: Family Medicine

## 2012-07-12 ENCOUNTER — Emergency Department (HOSPITAL_BASED_OUTPATIENT_CLINIC_OR_DEPARTMENT_OTHER)
Admission: EM | Admit: 2012-07-12 | Discharge: 2012-07-12 | Disposition: A | Discharge status: Home / Self Care | Payer: BC Managed Care – PPO | Attending: Emergency Medicine | Admitting: Emergency Medicine

## 2012-07-12 ENCOUNTER — Ambulatory Visit (INDEPENDENT_AMBULATORY_CARE_PROVIDER_SITE_OTHER): Payer: BC Managed Care – PPO | Admitting: Family

## 2012-07-12 ENCOUNTER — Encounter (HOSPITAL_BASED_OUTPATIENT_CLINIC_OR_DEPARTMENT_OTHER): Payer: Self-pay | Admitting: Family Medicine

## 2012-07-12 DIAGNOSIS — R791 Abnormal coagulation profile: Secondary | ICD-10-CM

## 2012-07-12 DIAGNOSIS — Z7901 Long term (current) use of anticoagulants: Secondary | ICD-10-CM | POA: Insufficient documentation

## 2012-07-12 DIAGNOSIS — I2699 Other pulmonary embolism without acute cor pulmonale: Secondary | ICD-10-CM

## 2012-07-12 DIAGNOSIS — N939 Abnormal uterine and vaginal bleeding, unspecified: Secondary | ICD-10-CM

## 2012-07-12 DIAGNOSIS — N898 Other specified noninflammatory disorders of vagina: Secondary | ICD-10-CM | POA: Insufficient documentation

## 2012-07-12 DIAGNOSIS — Z79899 Other long term (current) drug therapy: Secondary | ICD-10-CM | POA: Insufficient documentation

## 2012-07-12 DIAGNOSIS — Z86711 Personal history of pulmonary embolism: Secondary | ICD-10-CM | POA: Insufficient documentation

## 2012-07-12 DIAGNOSIS — Z85828 Personal history of other malignant neoplasm of skin: Secondary | ICD-10-CM | POA: Insufficient documentation

## 2012-07-12 DIAGNOSIS — D649 Anemia, unspecified: Secondary | ICD-10-CM | POA: Insufficient documentation

## 2012-07-12 LAB — CBC WITH DIFFERENTIAL/PLATELET
Basophils Relative: 1.1 % (ref 0.0–3.0)
Basophils Relative: 1 % (ref 0–1)
Eosinophils Absolute: 0.2 10*3/uL (ref 0.0–0.7)
Eosinophils Relative: 3.2 % (ref 0.0–5.0)
Eosinophils Relative: 3 % (ref 0–5)
HCT: 34.4 % — ABNORMAL LOW (ref 36.0–46.0)
HCT: 32.3 % — ABNORMAL LOW (ref 36.0–46.0)
Hemoglobin: 10.9 g/dL — ABNORMAL LOW (ref 12.0–15.0)
Hemoglobin: 10.5 g/dL — ABNORMAL LOW (ref 12.0–15.0)
Lymphocytes Relative: 28.2 % (ref 12.0–46.0)
Lymphocytes Relative: 25 % (ref 12–46)
Lymphs Abs: 1.8 10*3/uL (ref 0.7–4.0)
MCHC: 31.6 g/dL (ref 30.0–36.0)
MCV: 78.4 fL (ref 78.0–100.0)
Monocytes Absolute: 0.6 10*3/uL (ref 0.1–1.0)
Monocytes Relative: 6.1 % (ref 3.0–12.0)
Monocytes Relative: 8 % (ref 3–12)
Neutro Abs: 3.8 10*3/uL (ref 1.4–7.7)
Neutrophils Relative %: 61.4 % (ref 43.0–77.0)
Platelets: 377 10*3/uL (ref 150–400)
RBC: 4.28 Mil/uL (ref 3.87–5.11)
RBC: 4.12 MIL/uL (ref 3.87–5.11)
RDW: 15.4 % — ABNORMAL HIGH (ref 11.5–14.6)
WBC: 6.1 10*3/uL (ref 4.5–10.5)
WBC: 7.3 10*3/uL (ref 4.0–10.5)

## 2012-07-12 LAB — URINE MICROSCOPIC-ADD ON

## 2012-07-12 LAB — URINALYSIS, ROUTINE W REFLEX MICROSCOPIC
Glucose, UA: NEGATIVE mg/dL
Ketones, ur: NEGATIVE mg/dL
Leukocytes, UA: NEGATIVE
pH: 6.5 (ref 5.0–8.0)

## 2012-07-12 LAB — BASIC METABOLIC PANEL
CO2: 25 mEq/L (ref 19–32)
Calcium: 9.4 mg/dL (ref 8.4–10.5)
Chloride: 101 mEq/L (ref 96–112)
Creatinine, Ser: 0.8 mg/dL (ref 0.50–1.10)
Glucose, Bld: 93 mg/dL (ref 70–99)

## 2012-07-12 LAB — POCT INR: INR: 1.6

## 2012-07-12 LAB — WET PREP, GENITAL: Yeast Wet Prep HPF POC: NONE SEEN

## 2012-07-12 LAB — PREGNANCY, URINE: Preg Test, Ur: NEGATIVE

## 2012-07-12 NOTE — Patient Instructions (Addendum)
Today and tomorrow take 1 whole tablet. Then increase dose to Tuesday, Thursday (5mg ). All other days, 1 tab (10 mg).     Latest dosing instructions   Total Glynis Smiles Tue Wed Thu Fri Sat   60 10 mg 10 mg 5 mg 10 mg 5 mg 10 mg 10 mg    (10 mg1) (10 mg1) (10 mg0.5) (10 mg1) (10 mg0.5) (10 mg1) (10 mg1)

## 2012-07-12 NOTE — ED Provider Notes (Signed)
History     CSN: [id number]  Arrival date & time 07/12/12  1144   First MD Initiated Contact with Patient 07/12/12 1240      Chief Complaint  Patient presents with  . Vaginal Bleeding    (Consider location/radiation/quality/duration/timing/severity/associated sxs/prior treatment) HPI Comments: Kimberly Monroe is a 51 y.o. Female who presents with complaint of vaginal bleeding and dizziness. States bleeding started this morning. She is not having any pain. States bleeding is heavy, went through 4 pads in 4 hrs. States she is on coumadin and lovenox, did not take her lovenox this morning due to bleeding. States she was seen at coumadin clinic today and had INR checked, and CBC. States INR was 1.6 at that time, does not know what her hemoglobin was. States blood work drawn at 8am this morning. Since then, increased dizziness, weakness. States she feels dehydrated. States did not drink any fluids this morning.    Past Medical History  Diagnosis Date  . Depression   . History of blood clotting disorder   . Anemia   . Anxiety   . Allergy     seasonal  . Melanoma 2009    insitu  . Basal cell cancer   . Squamous cell carcinoma   . Pulmonary embolism     Past Surgical History  Procedure Date  . Cesarean section   . Breast surgery 2009    L intraductal papilloma  . 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 Mohs surgery     Family History  Problem Relation Age of Onset  . Cancer Father 71    colon cancer  . Colon cancer Father   . Cancer Paternal Uncle     prostate    History  Substance Use Topics  . Smoking status: Never Smoker   . Smokeless tobacco: Never Used  . Alcohol Use: No    OB History    Grav Para Term Preterm Abortions TAB SAB Ect Mult Living                  Review of Systems  Constitutional: Positive for fatigue. Negative for fever and chills.  Respiratory: Negative.   Cardiovascular: Negative.   Gastrointestinal: Negative for nausea,  vomiting, abdominal pain and diarrhea.  Genitourinary: Positive for vaginal bleeding. Negative for dysuria, vaginal discharge and vaginal pain.  Musculoskeletal: Negative.   Skin: Negative.   Neurological: Positive for dizziness, weakness, light-headedness and headaches.  Hematological: Bruises/bleeds easily.    Allergies  Penicillins  Home Medications   Current Outpatient Rx  Name Route Sig Dispense Refill  . CLONAZEPAM 1 MG PO TABS Oral Take 1 mg by mouth daily.    Marland Kitchen DOCUSATE SODIUM 100 MG PO CAPS Oral Take 100 mg by mouth 2 (two) times daily.    Marland Kitchen ENOXAPARIN SODIUM 100 MG/ML Joy SOLN Subcutaneous Inject 1 mL (100 mg total) into the skin every 12 (twelve) hours. 10 Syringe 0  . FERROUS SULFATE 325 (65 FE) MG PO TABS Oral Take 325 mg by mouth 3 (three) times daily with meals.    Marland Kitchen LAMOTRIGINE 50 MG PO TBDP Oral Take 1 tablet by mouth daily.    Marland Kitchen OVER THE COUNTER MEDICATION Oral Take 2 each by mouth daily. Over the Counter Multivitamin JUICE PLUS GUMMY MULTIVITAMIN.    . SERTRALINE HCL 100 MG PO TABS Oral Take 100 mg by mouth every morning.    . WARFARIN SODIUM 10 MG PO TABS Oral Take 1 tablet (  10 mg total) by mouth daily. 30 tablet 2    BP 118/77  Pulse 72  Temp 98.2 F (36.8 C) (Oral)  Resp 18  Ht 5\' 5"  (1.651 m)  Wt 200 lb (90.719 kg)  BMI 33.28 kg/m2  SpO2 98%  LMP 07/12/2012  Physical Exam  Nursing note and vitals reviewed. Constitutional: She is oriented to person, place, and time. She appears well-developed and well-nourished. No distress.  HENT:  Head: Normocephalic.  Eyes: Conjunctivae normal are normal.  Neck: Neck supple.  Cardiovascular: Normal rate, regular rhythm and normal heart sounds.   Pulmonary/Chest: Effort normal and breath sounds normal. No respiratory distress. She has no wheezes. She has no rales.  Abdominal: Soft. Bowel sounds are normal. She exhibits no distension. There is no tenderness. There is no rebound.  Musculoskeletal: She exhibits no  edema.  Neurological: She is alert and oriented to person, place, and time.  Skin: Skin is warm and dry.    ED Course  Procedures (including critical care time)  1:04 PM Pt seen and examined. She had blood work done at 8am this morning. Labs showed inr of 1.6, hgb of 10.9, iron 31. Will recheck hgb, pelvic exam.   Results for orders placed during the hospital encounter of 07/12/12  CBC WITH DIFFERENTIAL      Component Value Range   WBC 7.3  4.0 - 10.5 K/uL   RBC 4.12  3.87 - 5.11 MIL/uL   Hemoglobin 10.5 (*) 12.0 - 15.0 g/dL   HCT 40.9 (*) 81.1 - 91.4 %   MCV 78.4  78.0 - 100.0 fL   MCH 25.5 (*) 26.0 - 34.0 pg   MCHC 32.5  30.0 - 36.0 g/dL   RDW 78.2  95.6 - 21.3 %   Platelets 377  150 - 400 K/uL   Neutrophils Relative 64  43 - 77 %   Neutro Abs 4.7  1.7 - 7.7 K/uL   Lymphocytes Relative 25  12 - 46 %   Lymphs Abs 1.8  0.7 - 4.0 K/uL   Monocytes Relative 8  3 - 12 %   Monocytes Absolute 0.6  0.1 - 1.0 K/uL   Eosinophils Relative 3  0 - 5 %   Eosinophils Absolute 0.2  0.0 - 0.7 K/uL   Basophils Relative 1  0 - 1 %   Basophils Absolute 0.1  0.0 - 0.1 K/uL  URINALYSIS, ROUTINE W REFLEX MICROSCOPIC      Component Value Range   Color, Urine YELLOW  YELLOW   APPearance CLEAR  CLEAR   Specific Gravity, Urine 1.009  1.005 - 1.030   pH 6.5  5.0 - 8.0   Glucose, UA NEGATIVE  NEGATIVE mg/dL   Hgb urine dipstick LARGE (*) NEGATIVE   Bilirubin Urine NEGATIVE  NEGATIVE   Ketones, ur NEGATIVE  NEGATIVE mg/dL   Protein, ur 30 (*) NEGATIVE mg/dL   Urobilinogen, UA 0.2  0.0 - 1.0 mg/dL   Nitrite NEGATIVE  NEGATIVE   Leukocytes, UA NEGATIVE  NEGATIVE  PREGNANCY, URINE      Component Value Range   Preg Test, Ur NEGATIVE  NEGATIVE  WET PREP, GENITAL      Component Value Range   Yeast Wet Prep HPF POC NONE SEEN  NONE SEEN   Trich, Wet Prep NONE SEEN  NONE SEEN   Clue Cells Wet Prep HPF POC RARE (*) NONE SEEN   WBC, Wet Prep HPF POC FEW (*) NONE SEEN  BASIC METABOLIC PANEL  Component Value Range   Sodium 137  135 - 145 mEq/L   Potassium 3.9  3.5 - 5.1 mEq/L   Chloride 101  96 - 112 mEq/L   CO2 25  19 - 32 mEq/L   Glucose, Bld 93  70 - 99 mg/dL   BUN 10  6 - 23 mg/dL   Creatinine, Ser 5.40  0.50 - 1.10 mg/dL   Calcium 9.4  8.4 - 98.1 mg/dL   GFR calc non Af Amer 84 (*) >90 mL/min   GFR calc Af Amer >90  >90 mL/min  URINE MICROSCOPIC-ADD ON      Component Value Range   Squamous Epithelial / LPF RARE  RARE   WBC, UA 3-6  <3 WBC/hpf   RBC / HPF TOO NUMEROUS TO COUNT  <3 RBC/hpf   Bacteria, UA FEW (*) RARE   Urine-Other MUCOUS PRESENT      3:14 PM Pt with moderate vaginal bleeding on exam. No change in hemoglobin between 8am and now (about 5 hrs). Suspect bleeding is heavy due to pt being on coumadin and heparin. Pt's iron was low today, instructed to start taking it. Pt will need to be rechecked tomorrow. Will refer to GYN, and told to go to womens if unable to get follow up tomorrow. Pt voiced udnerstanding.   Filed Vitals:   07/12/12 1252  BP: 118/77  Pulse: 72  Temp:   Resp:         1. Vaginal bleeding   2. Subtherapeutic international normalized ratio (INR)   3. Anemia       MDM          Lottie Mussel, PA 07/13/12 [phone]

## 2012-07-12 NOTE — ED Notes (Signed)
MD at bedside. GYN cart prepped at bedside. Chaperone present.

## 2012-07-12 NOTE — Telephone Encounter (Signed)
Patient calling, was seen this morning for heavy vaginal bleeding. She on Lovenox injections and her INR is low at 1.6.  She is having dizziness and lightheadedness when standing.  Her legs are shaking also.  She is feeling hot , then cold.  Temp 99.5.  Skin is wet to touch.  She has saturated a pad an hour x 3 hours.  Passing clots the size of her fist.  Unsure when her cycle was due, bleeding just started this am.  Had cramping on Sunday but no bleeding until today.  Advised to call 911.  She then told me she is driving now to the ED on [address].   I stayed on the phone with her until she got  to the ED.  Someone brought out a w/c to take her in.

## 2012-07-12 NOTE — ED Notes (Signed)
Pt c/o vaginal bleeding since this morning and sts "it's very heavy and I just feel awful". Pt sts she has bled through several pads today and reports "clots". Pt sts she has had similar heavy periods in recent past. Pt alert and oriented. Pt drove self to ED.

## 2012-07-12 NOTE — ED Notes (Signed)
Blood redrawn by request from lab

## 2012-07-13 LAB — GC/CHLAMYDIA PROBE AMP, GENITAL: Chlamydia, DNA Probe: NEGATIVE

## 2012-07-13 NOTE — ED Provider Notes (Signed)
Medical screening examination/treatment/procedure(s) were performed by non-physician practitioner and as supervising physician I was immediately available for consultation/collaboration.   Gwyneth Sprout, MD 07/13/12 [phone]

## 2012-07-15 ENCOUNTER — Encounter: Payer: BC Managed Care – PPO | Admitting: Family

## 2012-07-15 DIAGNOSIS — Z0289 Encounter for other administrative examinations: Secondary | ICD-10-CM

## 2012-07-16 ENCOUNTER — Telehealth: Payer: Self-pay | Admitting: Family

## 2012-07-16 ENCOUNTER — Ambulatory Visit (INDEPENDENT_AMBULATORY_CARE_PROVIDER_SITE_OTHER): Payer: BC Managed Care – PPO | Admitting: Family

## 2012-07-16 DIAGNOSIS — D509 Iron deficiency anemia, unspecified: Secondary | ICD-10-CM

## 2012-07-16 DIAGNOSIS — I2699 Other pulmonary embolism without acute cor pulmonale: Secondary | ICD-10-CM

## 2012-07-16 LAB — POCT HEMOGLOBIN: Hemoglobin: 10.4 g/dL — AB (ref 12.2–16.2)

## 2012-07-16 LAB — POCT INR: INR: 1.5

## 2012-07-16 NOTE — Telephone Encounter (Signed)
Take 15mg  of Coumadin today and tomorrow. Then increase dose to Tuesday, Thursday (5mg ). All other days, 1 tab (10 mg). Recheck in 1 week.  Be sure she is taking coumadin everyday!! Recheck in 1 week    Latest dosing instructions   Total Sun Mon Tue Wed Thu Fri Sat   60 10 mg 10 mg 5 mg 10 mg 5 mg 10 mg 10 mg    (10 mg1) (10 mg1) (10 mg0.5) (10 mg1) (10 mg0.5) (10 mg1) (10 mg1)

## 2012-07-16 NOTE — Patient Instructions (Addendum)
Take 15mg  of Coumadin today and tomorrow. Then increase dose to Tuesday, Thursday (5mg ). All other days, 1 tab (10 mg).     Latest dosing instructions   Total Glynis Smiles Tue Wed Thu Fri Sat   60 10 mg 10 mg 5 mg 10 mg 5 mg 10 mg 10 mg    (10 mg1) (10 mg1) (10 mg0.5) (10 mg1) (10 mg0.5) (10 mg1) (10 mg1)

## 2012-07-16 NOTE — Telephone Encounter (Signed)
Pt aware and verbalized understanding. appt scheduled

## 2012-07-19 ENCOUNTER — Encounter: Payer: BC Managed Care – PPO | Admitting: Family

## 2012-07-22 ENCOUNTER — Ambulatory Visit (INDEPENDENT_AMBULATORY_CARE_PROVIDER_SITE_OTHER): Payer: BC Managed Care – PPO | Admitting: Family

## 2012-07-22 DIAGNOSIS — I2699 Other pulmonary embolism without acute cor pulmonale: Secondary | ICD-10-CM

## 2012-07-22 LAB — POCT INR: INR: 1.6

## 2012-07-22 NOTE — Patient Instructions (Signed)
Take 15mg  of Coumadin today only. Then increase dose to Tuesday, Thursday (5mg ). All other days, 1 tab (10 mg). Recheck in 2 weeks.     Latest dosing instructions   Total Glynis Smiles Tue Wed Thu Fri Sat   70 10 mg 10 mg 10 mg 10 mg 10 mg 10 mg 10 mg    (10 mg1) (10 mg1) (10 mg1) (10 mg1) (10 mg1) (10 mg1) (10 mg1)

## 2012-07-28 ENCOUNTER — Telehealth: Payer: Self-pay | Admitting: Hematology & Oncology

## 2012-07-28 NOTE — Telephone Encounter (Signed)
Per Tiffany still waiting for referring to call back to let us know reason for referral since PE is resolved.

## 2012-08-04 ENCOUNTER — Telehealth: Payer: Self-pay | Admitting: Hematology & Oncology

## 2012-08-04 NOTE — Telephone Encounter (Signed)
I called and talked to Tiffany about pt. Still have not heard from referring about why pt is being referred to [place].

## 2012-08-05 ENCOUNTER — Encounter: Payer: BC Managed Care – PPO | Admitting: Family

## 2012-08-09 ENCOUNTER — Telehealth: Payer: Self-pay | Admitting: *Deleted

## 2012-08-09 ENCOUNTER — Telehealth: Payer: Self-pay | Admitting: Hematology & Oncology

## 2012-08-09 NOTE — Telephone Encounter (Signed)
Called [name] talked to [name] to see if we still needed to see pt since per [name] PE is resolved and not sure why we need to see pt. Kimberly Monroe said she would call back if needed.

## 2012-08-09 NOTE — Telephone Encounter (Signed)
Received a call from [place] who works with Dr Myna Hidalgo at the [facility].  They are asking why this pt has been referred to

## 2012-08-09 NOTE — Telephone Encounter (Signed)
We cancelled the referral. She does not need to be seen per Hematologist.

## 2012-08-09 NOTE — Telephone Encounter (Signed)
[facility], onocology as "her PE is resolved".  Pt was referred by Adline Mango on 07-22-12. Raiford Noble can be reached at [phone]

## 2012-08-10 ENCOUNTER — Telehealth: Payer: Self-pay | Admitting: Hematology & Oncology

## 2012-08-10 NOTE — Telephone Encounter (Signed)
Left message to advise Raiford Noble on [facility] note

## 2012-08-10 NOTE — Telephone Encounter (Signed)
Received call from Tamesha with Dr. Orvan Falconer at [facility], they have cancelled the referral.

## 2012-08-17 ENCOUNTER — Telehealth: Payer: Self-pay | Admitting: *Deleted

## 2012-08-17 NOTE — Telephone Encounter (Signed)
My Chart note received re: pt concerned, has question re: D/C her coumadin.  The note went back and forth between Gardiner Ramus, Kyung Rudd, Adline Mango and finally Dr Caryl Never.  He suggested a follow-up visit to discuss.  I left a message on home phone 10/25 and again today, 08/17/12 to please call for OV to discuss her concerns.

## 2012-08-19 ENCOUNTER — Encounter: Payer: Self-pay | Admitting: Family Medicine

## 2012-08-19 ENCOUNTER — Ambulatory Visit (INDEPENDENT_AMBULATORY_CARE_PROVIDER_SITE_OTHER): Payer: BC Managed Care – PPO | Admitting: Family Medicine

## 2012-08-19 VITALS — BP 138/78 | Temp 97.8°F | Wt 213.0 lb

## 2012-08-19 DIAGNOSIS — D649 Anemia, unspecified: Secondary | ICD-10-CM

## 2012-08-19 DIAGNOSIS — I2699 Other pulmonary embolism without acute cor pulmonale: Secondary | ICD-10-CM

## 2012-08-19 DIAGNOSIS — Z862 Personal history of diseases of the blood and blood-forming organs and certain disorders involving the immune mechanism: Secondary | ICD-10-CM

## 2012-08-19 DIAGNOSIS — Z86711 Personal history of pulmonary embolism: Secondary | ICD-10-CM

## 2012-08-19 NOTE — Progress Notes (Signed)
Subjective:     Patient ID: Kimberly Monroe, female   DOB: 11-Dec-1960, 51 y.o.   MRN: [id number]  HPI 51 y.o. female with history of PE and family hx of coagulopathy here to discuss recent instruction to discontinue coumadin.  Had been on coumadin and followed here by coumadin clinic following PE in May of this year.  Was told 4 weeks ago by coumadin clinic that her PT/INRs had been sub-therapeutic and that she would be scheduled to see a hematologist for evaluation.  States that she never received call for this appt, and then 2 weeks ago was told she should discontinue coumadin, which she did.  She is concerned about not being on coumadin now given her PE hx and family hx, and would like referral to hematologist to discuss indications for this, or resumption of coumadin if appropriate.  She was also noted to be anemic by several CBCs, and started iron supplement for ~4 weeks, but d/c'd it 2 weeks ago when she stopped coumadin.  Still menstruates and states that they are heavy.  Review of Systems  Respiratory: Negative for chest tightness and shortness of breath.   Cardiovascular: Negative for chest pain and leg swelling.  Genitourinary: Positive for vaginal bleeding (menorrhagia).  Hematological:       Recent hx of PE and was on coumadin until 2 weeks ago.       Objective:   Physical Exam  Constitutional: She is oriented to person, place, and time. She appears well-developed and well-nourished. No distress.  Cardiovascular: Normal rate, regular rhythm, normal heart sounds and intact distal pulses.   Pulmonary/Chest: Effort normal and breath sounds normal. No respiratory distress.  Musculoskeletal: She exhibits no edema.  Neurological: She is alert and oriented to person, place, and time.       Assessment:     51 year old with history of PE and family hx of coagulopathy here for discussion about recent discontinuation of coumadin.    Plan:     1. Coumadin: completed 6 months of  treatment, and appears from records that [name] reviewed her history and determined that further treatment with coumadin was not necessary.  She is still apprehensive about being off coumadin, and would like referral to discuss this with hematology, especially given her family history of coagulopathy in her sister.  Referral placed; scheduling will follow up with pt regarding date and time of appt/ 2. Anemia: likely iron deficiency anemia, given pt's recent labs showing low iron and pt is still menstruating.  Resume taking iron supplement and follow up if symptoms develop or worsen.  Marthann Schiller, MS3  Agree with assessment and plan as per Marthann Schiller, MS 3 Pt requesting hematology referral as above to discuss pros and cons of ongoing coumadin therapy. Positive pulmonary embolus and ?FH of coagulopathy.  She has hx iron deficiency anemia which is very likely menstrual related.  Recent colonoscopy 4/13 normal.  She is encouraged to get back on Fe supplement.  Evelena Peat MD

## 2012-08-20 ENCOUNTER — Telehealth: Payer: Self-pay | Admitting: Hematology & Oncology

## 2012-08-20 NOTE — Telephone Encounter (Signed)
Left pt message to call and schedule appointment °

## 2012-08-23 ENCOUNTER — Telehealth: Payer: Self-pay | Admitting: Hematology & Oncology

## 2012-08-23 ENCOUNTER — Telehealth: Payer: Self-pay | Admitting: Family Medicine

## 2012-08-23 NOTE — Telephone Encounter (Addendum)
Pt was calling concerning hematologist referral also pt has stopped take her coumadin. Pt requesting to speak with nancy call transferred to nurse

## 2012-08-23 NOTE — Telephone Encounter (Signed)
Joni Reining reports she did ask if another provider could see her sooner and was told each doctor reviews all records first, so in doing that with another provider at this time would delay things further.  Dr Gustavo Lah nurse was going to ask him if he would consider seeing her sooner, and if so, the will call pt directly.  I spoke with pt, she "has plans to drive to [place] for Thanksgiving and now all her plans are up in the air".  Pt requesting a copy of her medical record so she can find another hematologist, perhaps [facility].  Pt will come to office and sign release of medical records.  Holly agreed to assist with obtaining medical records and pt will be here 8:30 am.  Also, pt was trying to get on to "My Chart" and was not able to as it would not accept the last 4 digits of her SS#.  I told her what we had in her chart for SS# and it was wrong.  She stated she "Usually does not give out her SS#."

## 2012-08-23 NOTE — Telephone Encounter (Signed)
Pt was C/O a message on her phone re: referral, she was to call office back.  The number was wrong, this along with a variety of other concerns pt reporting problems.  She also is concerned about being off coumadin as she cannot get in to see Dr Myna Hidalgo until Monday, Dec 2nd @ 2:30 pm.  Joni Reining called and spoke with Dr Gustavo Lah nurse and because of the holiday this is his earliest appt.  Nurse did agree to put pt on a cancellation list.  Pt asking Dr Caryl Never if she could go back on coumadin as she is now worried about another blood clot and the wait time for appt.

## 2012-08-23 NOTE — Telephone Encounter (Signed)
Patient called back to sch New patient apt.  She sch apt for 09/06/12 at 2:30.  Patient is aware of apt and instructions to bring medication.

## 2012-08-23 NOTE — Telephone Encounter (Signed)
Kimberly Monroe, have you already tried getting pt appt with another provider?

## 2012-08-23 NOTE — Telephone Encounter (Signed)
Is there not another hematologist with their group that could see her sooner?  I would prefer not starting her back on coumadin at this time with no clear indication for maintenance- esp if hematologist decides to do any further testing.

## 2012-08-24 NOTE — Telephone Encounter (Signed)
Dr. Caryl Never, please see below, so you are aware of status of Ms. Pion' situation and hematologist referral. Thank you.

## 2012-08-26 ENCOUNTER — Other Ambulatory Visit: Payer: Self-pay | Admitting: Family

## 2012-08-26 ENCOUNTER — Other Ambulatory Visit: Payer: Self-pay | Admitting: Family Medicine

## 2012-08-29 NOTE — Telephone Encounter (Signed)
We offered our assistance to try to see another group and pt requested her records to set up herself.  Though she is reluctant to come off coumadin, she does not have a clear indication for long term therapy- and she has had some significant menstrual blood loss/anemia which also poses some risk.  Hopefully, hematology can help her sort through risks and benefits of chronic therapy.

## 2012-09-06 ENCOUNTER — Ambulatory Visit (HOSPITAL_BASED_OUTPATIENT_CLINIC_OR_DEPARTMENT_OTHER): Payer: BC Managed Care – PPO | Admitting: Medical

## 2012-09-06 ENCOUNTER — Ambulatory Visit (HOSPITAL_BASED_OUTPATIENT_CLINIC_OR_DEPARTMENT_OTHER): Payer: BC Managed Care – PPO | Admitting: Lab

## 2012-09-06 ENCOUNTER — Ambulatory Visit: Payer: BC Managed Care – PPO

## 2012-09-06 VITALS — BP 139/71 | HR 66 | Temp 98.2°F | Resp 18 | Ht 65.0 in | Wt 218.0 lb

## 2012-09-06 DIAGNOSIS — I2699 Other pulmonary embolism without acute cor pulmonale: Secondary | ICD-10-CM

## 2012-09-06 DIAGNOSIS — D509 Iron deficiency anemia, unspecified: Secondary | ICD-10-CM

## 2012-09-06 DIAGNOSIS — Z85820 Personal history of malignant melanoma of skin: Secondary | ICD-10-CM

## 2012-09-06 DIAGNOSIS — D6859 Other primary thrombophilia: Secondary | ICD-10-CM

## 2012-09-06 DIAGNOSIS — Z86711 Personal history of pulmonary embolism: Secondary | ICD-10-CM

## 2012-09-06 LAB — CBC WITH DIFFERENTIAL (CANCER CENTER ONLY)
BASO#: 0.1 10*3/uL (ref 0.0–0.2)
Eosinophils Absolute: 0.2 10*3/uL (ref 0.0–0.5)
HCT: 32.8 % — ABNORMAL LOW (ref 34.8–46.6)
HGB: 10.1 g/dL — ABNORMAL LOW (ref 11.6–15.9)
LYMPH#: 2.6 10*3/uL (ref 0.9–3.3)
LYMPH%: 26.6 % (ref 14.0–48.0)
MCH: 22.9 pg — ABNORMAL LOW (ref 26.0–34.0)
MCV: 74 fL — ABNORMAL LOW (ref 81–101)
MONO%: 7.5 % (ref 0.0–13.0)
NEUT%: 63.9 % (ref 39.6–80.0)
RBC: 4.41 10*6/uL (ref 3.70–5.32)
WBC: 9.7 10*3/uL (ref 3.9–10.0)

## 2012-09-06 LAB — RETICULOCYTES (CHCC)
ABS Retic: 67.7 10*3/uL (ref 19.0–186.0)
Retic Ct Pct: 1.5 % (ref 0.4–2.3)

## 2012-09-06 LAB — IRON AND TIBC: TIBC: 432 ug/dL (ref 250–470)

## 2012-09-06 LAB — FERRITIN: Ferritin: 6 ng/mL — ABNORMAL LOW (ref 10–291)

## 2012-09-06 LAB — CHCC SATELLITE - SMEAR

## 2012-09-06 MED ORDER — RIVAROXABAN 15 MG PO TABS: 15.0000 mg | ORAL_TABLET | Freq: Two times a day (BID) | ORAL | Status: DC

## 2012-09-06 MED ORDER — RIVAROXABAN 20 MG PO TABS: 20.0000 mg | ORAL_TABLET | Freq: Every day | ORAL | Status: DC

## 2012-09-06 NOTE — H&P (Deleted)
[facility] NEW PATIENT EVALUATION   Name: Kimberly Monroe Date: 09/06/2012 MRN: [id number] DOB: 20-Jun-1961   REFERRING PHYSICIAN: Kristian Covey, MD  REASON FOR REFERRAL:  History of PE and coagulopathy    HISTORY OF PRESENT ILLNESS:Kimberly Monroe is a 51 y.o. female who is was kindly referred to [place] for further evaluation of hypercoagulable state.  Kimberly Monroe has a history of pulmonary embolism and family history of coagulopathy.  Kimberly Monroe, reports, that she was found to have pulmonary embolism back in may of this year.  She was placed on Coumadin for 6 months, and recently told that she could discontinue the Coumadin.  Kimberly Monroe, reports, that she had an issue, discontinuing the Coumadin, secondary to a family history of coagulopathy.  She apparently, also had a hypercoagulable workup.  Back in may.  Her anti-thrombin III was 74, protein C&S were normal lupus anticoagulant panel was normal.  Her beta-2 glycoprotein was normal.  Her homocystine level was normal.  Her factor V Leyden was negative.  Her Cardiolite and antibodies were all low.  She also had a workup for iron deficiency.  Her iron was quite low at less than 10 and a ferritin of 15.  Her folate, and vitamin B12 levels were all normal.  She also reports, that she has had a history of anemia.  She does have very heavy menstrual cycles.  She was recently placed on oral iron supplements.  Of note, her most recent colonoscopy was 01/29/2012, which was benign.   PAST MEDICAL HISTORY:  has a past medical history of Depression; History of blood clotting disorder; Anemia; Anxiety; Allergy; Melanoma (2009); Basal cell cancer; Squamous cell carcinoma; and Pulmonary embolism.     PAST SURGICAL HISTORY: Past Surgical History  Procedure Date  . Cesarean section   . Breast surgery 2009    L intraductal papilloma  . 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 Mohs surgery      CURRENT MEDICATIONS: Kimberly Monroe does not  currently have medications on file.   ALLERGIES: Penicillins   SOCIAL HISTORY:  reports that she has never smoked. She has never used smokeless tobacco. She reports that she does not drink alcohol or use illicit drugs.   FAMILY HISTORY: family history includes Cancer in her paternal uncle; Cancer (age of onset:70) in her father; and Colon cancer in her father.  Laboratory Data: White count 9.7, hemoglobin 10.1, hematocrit 32.8, MCV 74, platelets 395,000  Review of Systems: Constitutional:Negative for malaise/fatigue, fever, chills, weight loss, diaphoresis, activity change, appetite change, and unexpected weight change.  HEENT: Negative for double vision, blurred vision, visual loss, ear pain, tinnitus, congestion, rhinorrhea, epistaxis sore throat or sinus disease, oral pain/lesion, tongue soreness Respiratory: Negative for cough, chest tightness, shortness of breath, wheezing and stridor.  Cardiovascular: Negative for chest pain, palpitations, leg swelling, orthopnea, PND, DOE or claudication Gastrointestinal: Negative for nausea, vomiting, abdominal pain, diarrhea, constipation, blood in stool, melena, hematochezia, abdominal distention, anal bleeding, rectal pain, anorexia and hematemesis.  Genitourinary: Negative for dysuria, frequency, hematuria,  Musculoskeletal: Negative for myalgias, back pain, joint swelling, arthralgias and gait problem.  Skin: Negative for rash, color change, pallor and wound.  Neurological:. Negative for dizziness/light-headedness, tremors, seizures, syncope, facial asymmetry, speech difficulty, weakness, numbness, headaches and paresthesias.  Hematological: Negative for adenopathy. Does not bruise/bleed easily.  Psychiatric/Behavioral:  Negative for depression, no loss of interest in normal activity or change in sleep pattern.   Physical Exam: Vitals:  HEENT reveals a normocephalic, atraumatic skull, no scleral icterus, no oral lesions  Neck is supple  without any cervical or supraclavicular adenopathy.  Lungs are clear to auscultation bilaterally. There are no wheezes, rales or rhonci Cardiac is regular rate and rhythm with a normal S1 and S2. There are no murmurs, rubs, or bruits.  Abdomen is soft with good bowel sounds, there is no palpable mass. There is no palpable hepatosplenomegaly. There is no palpable fluid wave.  Musculoskeletal no tenderness of the spine, ribs, or hips.  Extremities there are no clubbing, cyanosis, or edema.  Skin no petechia, purpura or ecchymosis Neurologic is nonfocal.  Assessment/Plan: This is a pleasant, 51

## 2012-09-07 NOTE — Progress Notes (Signed)
This office note has been dictated.

## 2012-09-07 NOTE — H&P (Signed)
[facility] NEW PATIENT EVALUATION   Name: Kimberly Monroe Date: 09/06/2012 MRN: [id number] DOB: August 21, 1961   REFERRING PHYSICIAN: Kristian Covey, MD  REASON FOR REFERRAL:  History of PE and coagulopathy    HISTORY OF PRESENT ILLNESS:Kimberly Monroe is a 51 y.o. female who is was kindly referred to [place] for further evaluation of hypercoagulable state.  [name] has a history of pulmonary embolism and family history of coagulopathy.  [name], reports, that she was found to have pulmonary embolism back in may of this year.  She was placed on Coumadin for 6 months, and recently told that she could discontinue the Coumadin.  [name], reports, that she had an issue, discontinuing the Coumadin, secondary to a family history of coagulopathy.  She apparently, also had a hypercoagulable workup back in May.  Her anti-thrombin III was 74, protein C&S were normal lupus anticoagulant panel was normal.  Her beta-2 glycoprotein was normal.  Her homocystine level was normal.  Her factor V Leyden was negative.  Her Cardiolipin antibodies were all low.  She also had a workup for iron deficiency.  Her iron was quite low at less than 10 and a ferritin of 15.  Her folate, and vitamin B12 levels were all normal.  She also reports, that she has had a history of anemia.  She does have very heavy menstrual cycles.  She was recently placed on oral iron supplements.  Of note, her most recent colonoscopy was 01/29/2012, which was benign.  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  She also has a history of melanoma, for which she had Mohs surgery for as well as multiple squamous cell skin cancers, that have been removed.  She is also had a basal cell skin, cancer that has been removed, as well. She does followup with a dermatologist every 6 months.  Given the extent of her pulmonary embolism, as well as her sister with a history of  coagulopathy, [name] requires at least 2 years of anticoagulation.  She would be a good candidate for Xarelto.  In terms of her iron deficiency anemia.  Previous iron panels have proven, her iron deficiency.  We are repeating an iron panel today.  She will more than likely need IV iron.  PAST MEDICAL HISTORY:  has a past medical history of Depression; History of blood clotting disorder; Anemia; Anxiety; Allergy; Melanoma (2009); Basal cell cancer; Squamous cell carcinoma; and Pulmonary embolism.    PAST SURGICAL HISTORY: Past Surgical History  Procedure Date  . Cesarean section   . Breast surgery 2009    L intraductal papilloma  . 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 Mohs surgery    Current Outpatient Prescriptions on File Prior to Visit  Medication Sig Dispense Refill  . clonazePAM (KLONOPIN) 1 MG tablet Take 1 mg by mouth daily.      Marland Kitchen docusate sodium (COLACE) 100 MG capsule Take 100 mg by mouth 2 (two) times daily.      . ferrous sulfate 325 (65 FE) MG tablet Take 325 mg by mouth 3 (three) times daily with meals.      . LamoTRIgine (LAMICTAL ODT) 50 MG TBDP Take 1 tablet by mouth daily.      Marland Kitchen OVER THE COUNTER MEDICATION Take 2 each by mouth daily. Over the Counter Multivitamin JUICE PLUS GUMMY MULTIVITAMIN.      . sertraline (  ZOLOFT) 100 MG tablet Take 100 mg by mouth every morning.      . Rivaroxaban (XARELTO) 20 MG TABS Take 1 tablet (20 mg total) by mouth daily.  30 tablet  6   ALLERGIES: Penicillins  SOCIAL HISTORY:  reports that she has never smoked. She has never used smokeless tobacco. She reports that she does not drink alcohol or use illicit drugs. She is married with three kids.  She is a Futures trader.  FAMILY HISTORY: family history includes Cancer in her paternal uncle; Cancer (age of onset:70) in her father; and Colon cancer in her father. Sister with coagulopathy.   Laboratory Data: White count 9.7, hemoglobin 10.1, hematocrit 32.8, MCV 74, platelets  395,000  Review of Systems: Constitutional:Negative for malaise/fatigue, fever, chills, weight loss, diaphoresis, activity change, appetite change, and unexpected weight change.  HEENT: Negative for double vision, blurred vision, visual loss, ear pain, tinnitus, congestion, rhinorrhea, epistaxis sore throat or sinus disease, oral pain/lesion, tongue soreness Respiratory: Negative for cough, chest tightness, shortness of breath, wheezing and stridor.  Cardiovascular: Negative for chest pain, palpitations, leg swelling, orthopnea, PND, DOE or claudication Gastrointestinal: Negative for nausea, vomiting, abdominal pain, diarrhea, constipation, blood in stool, melena, hematochezia, abdominal distention, anal bleeding, rectal pain, anorexia and hematemesis.  Genitourinary: Negative for dysuria, frequency, hematuria,  Musculoskeletal: Negative for myalgias, back pain, joint swelling, arthralgias and gait problem.  Skin: Negative for rash, color change, pallor and wound.  Neurological:. Negative for dizziness/light-headedness, tremors, seizures, syncope, facial asymmetry, speech difficulty, weakness, numbness, headaches and paresthesias.  Hematological: Negative for adenopathy. Does not bruise/bleed easily.  Psychiatric/Behavioral:  Negative for depression, no loss of interest in normal activity or change in sleep pattern.   Physical Exam: This is a pleasant, 51 year old, well-developed, well-nourished, white female, in no obvious distress Vitals: Temperature 90.2 degrees, pulse 66, respirations 18, blood pressure 139/71, weight 218 pounds HEENT reveals a normocephalic, atraumatic skull, no scleral icterus, no oral lesions  Neck is supple without any cervical or supraclavicular adenopathy.  Lungs are clear to auscultation bilaterally. There are no wheezes, rales or rhonci Cardiac is regular rate and rhythm with a normal S1 and S2. There are no murmurs, rubs, or bruits.  Abdomen is soft with good bowel  sounds, there is no palpable mass. There is no palpable hepatosplenomegaly. There is no palpable fluid wave.  Musculoskeletal no tenderness of the spine, ribs, or hips.  Extremities there are no clubbing, cyanosis, or edema.  Skin no petechia, purpura or ecchymosis Neurologic is nonfocal.  Assessment/Plan: This is a pleasant, 51 year old, white female, with the following issues:  #1.  History of extensive bilateral pulmonary embolism back in may of 2013.  She was placed on Coumadin for 6 months, and was recently told to discontinue the Coumadin.  She does have a sister with a history of coagulopathy.  I think given the extent of her bilateral pulmonary embolism, as well as a sister with coagulopathy, Ms. Moccio should remain on anticoagulation for at least 2 years.  I think Xarelto would be a good choice for this.  Arvilla [employer].  We will go ahead and start her out, at 15 mg twice a day for 3 weeks and then maintenance at 20 mg daily.  We are repeating a hypercoagulable panel today.  #2.  Iron deficiency anemia.  She is clearly iron deficient, with previous results from iron panels.  She clearly is not absorbing by mouth iron.  We will get her set up for IV iron with Feraheme, 1,020mg .    #  3.  History of melanoma, and basal, and squamous cell skin cancers.  She will continue to followup with her dermatologist.  Every 6 months.  #4.  History of the left intraductal papilloma.  This was over 10 years ago.  She will continue with yearly mammograms.  #5.  Followup.  Ms. Troost will follow back up with [place] in 6 weeks, but before then should there be questions or concerns.  Addendum:  Ms. Willbanks iron panel from today revealed an iron of 21 with 5% saturation and a ferritin level was 6.  She is being scheduled for IV iron.

## 2012-09-08 ENCOUNTER — Telehealth: Payer: Self-pay | Admitting: *Deleted

## 2012-09-08 ENCOUNTER — Telehealth: Payer: Self-pay | Admitting: Hematology & Oncology

## 2012-09-08 NOTE — Telephone Encounter (Signed)
Patient spoke with Rn.  Per Rn to sch Iron apt.  Patient sch apt for 09/13/12.  Patient is aware of apt and states she will be here

## 2012-09-08 NOTE — Telephone Encounter (Addendum)
Message copied by Wynonia Hazard on Wed Sep 08, 2012  1:13 PM ------      Message from: Josph Macho      Created: Tue Sep 07, 2012  7:36 AM       Call and tell her that her iron was very low. She needs a dose of Feraheme in a week or two at 1020 mg. Please set this up. Thanks. Pete  09/08/12 - Spoke to the pt about her iron results. She also mentioned that she was not comfortable with taking Xarelto and preferred to take Coumadin instead based on the research she had done. Will inform Dr Myna Hidalgo of her decision. Forwarded  to scheduling to set up her appt.

## 2012-09-09 ENCOUNTER — Other Ambulatory Visit: Payer: Self-pay | Admitting: *Deleted

## 2012-09-09 ENCOUNTER — Other Ambulatory Visit: Payer: Self-pay | Admitting: Pharmacist

## 2012-09-09 DIAGNOSIS — I2699 Other pulmonary embolism without acute cor pulmonale: Secondary | ICD-10-CM | POA: Insufficient documentation

## 2012-09-09 DIAGNOSIS — D509 Iron deficiency anemia, unspecified: Secondary | ICD-10-CM

## 2012-09-09 LAB — HYPERCOAGULABLE PANEL, COMPREHENSIVE
Anticardiolipin IgA: 5 APL U/mL (ref <22)
Anticardiolipin IgM: 1 MPL U/mL (ref <11)
Protein S Activity: 78 % (ref 69–129)

## 2012-09-09 MED ORDER — WARFARIN SODIUM 10 MG PO TABS: ORAL_TABLET | ORAL | Status: DC

## 2012-09-09 NOTE — Telephone Encounter (Addendum)
Pt called and wished to be placed on Coumadin instead of Xarelto. Reviewed with Dr Myna Hidalgo. He'd prefer her to take the Xarelto but if she wants to take the Coumadin she will have to be followed by the Coumadin Clinic at the main cancer center at [facility]. She agreed to do so and will start on the last dose she was on 5 mg daily except for 10 mg on M-W-F (reviewed with Kolleen, pharmD and Dr Myna Hidalgo). The Coumadin Clinic will contact her to be seen in 1 week for an INR check.

## 2012-09-13 ENCOUNTER — Ambulatory Visit (HOSPITAL_BASED_OUTPATIENT_CLINIC_OR_DEPARTMENT_OTHER): Payer: BC Managed Care – PPO

## 2012-09-13 DIAGNOSIS — D509 Iron deficiency anemia, unspecified: Secondary | ICD-10-CM

## 2012-09-13 MED ORDER — SODIUM CHLORIDE 0.9 % IV SOLN
1020.0000 mg | Freq: Once | INTRAVENOUS | Status: AC
  Administered 2012-09-13: 1020 mg via INTRAVENOUS
  Filled 2012-09-13: qty 34

## 2012-09-16 ENCOUNTER — Other Ambulatory Visit: Payer: Self-pay | Admitting: Pharmacist

## 2012-09-16 DIAGNOSIS — I2699 Other pulmonary embolism without acute cor pulmonale: Secondary | ICD-10-CM

## 2012-09-17 ENCOUNTER — Other Ambulatory Visit (HOSPITAL_BASED_OUTPATIENT_CLINIC_OR_DEPARTMENT_OTHER): Payer: BC Managed Care – PPO | Admitting: Lab

## 2012-09-17 ENCOUNTER — Ambulatory Visit (HOSPITAL_BASED_OUTPATIENT_CLINIC_OR_DEPARTMENT_OTHER): Payer: BC Managed Care – PPO | Admitting: Pharmacist

## 2012-09-17 DIAGNOSIS — I2699 Other pulmonary embolism without acute cor pulmonale: Secondary | ICD-10-CM

## 2012-09-17 LAB — POCT INR: INR: 1.9

## 2012-09-17 NOTE — Patient Instructions (Signed)
Continue 5 mg daily with 10 mg on MWF.  Will return to clinic in 2 weeks.

## 2012-09-17 NOTE — Progress Notes (Signed)
New patient to coumadin clinic.  Pt is 1.9 today after starting back on coumadin on Monday.  Will continue 5 mg daily with 10 mg on MWF.  Will return to clinic in 2 weeks. No changes to report with meds or diets since she started.

## 2012-09-17 NOTE — Telephone Encounter (Signed)
Records copied by nurse for pt at time of request. As of 12/13, pt has still not come to pick up the records. Records given to [place] to hold for time being.

## 2012-10-01 ENCOUNTER — Ambulatory Visit (HOSPITAL_BASED_OUTPATIENT_CLINIC_OR_DEPARTMENT_OTHER): Payer: BC Managed Care – PPO | Admitting: Pharmacist

## 2012-10-01 ENCOUNTER — Other Ambulatory Visit (HOSPITAL_BASED_OUTPATIENT_CLINIC_OR_DEPARTMENT_OTHER): Payer: BC Managed Care – PPO

## 2012-10-01 DIAGNOSIS — I2699 Other pulmonary embolism without acute cor pulmonale: Secondary | ICD-10-CM

## 2012-10-01 LAB — PROTIME-INR: Protime: 14.4 Seconds — ABNORMAL HIGH (ref 10.6–13.4)

## 2012-10-01 MED ORDER — WARFARIN SODIUM 7.5 MG PO TABS: 7.5000 mg | ORAL_TABLET | Freq: Every day | ORAL | Status: DC

## 2012-10-01 NOTE — Progress Notes (Signed)
INR subtherapeutic today (1.2) on 5mg  daily except 10mg  on MWF.   No missed doses.  Pt believes her INR is down since she ate so much spinach over the last 3 days.   Her prescribed Coumadin dose is also highly erratic, fluctuating from 5 to 10mg  from day to day.  Will keep weekly dose close to the same, but modify the daily dose so that she is taking 7.5mg  daily. A new Rx for 7.5mg  tablets [facility]  Will recheck INR in ~10 days.

## 2012-10-05 ENCOUNTER — Telehealth: Payer: Self-pay | Admitting: Pharmacist

## 2012-10-05 NOTE — Telephone Encounter (Signed)
Message copied by Jacqlyn Larsen on Tue Oct 05, 2012  9:20 AM ------      Message from: Amada Jupiter, MELISSA A      Created: Mon Oct 04, 2012  4:22 PM      Regarding: RE: lab/anticoag clinic       Time not available. Due to lab meeting 1st available is 10:15 lab and 10:30 CC.            Melissa      ----- Message -----         From: Davene Costain [place], [place]         Sent: 10/01/2012   3:28 PM           To: Earnest Conroy, [facility]      Subject: lab/anticoag clinic                                      Please schedule Nyjai Graff (MRN [id number]#) for 10/12/12# at 0800# for lab & 0815# for Coumadin Clinic.      Please reply or call Coumadin Clinic Pharmacist if not able to match this date/time       MD is Ennever#.      Thanks!!

## 2012-10-05 NOTE — Telephone Encounter (Signed)
Called pt to reschedule lab/cc appt for 10/12/12.  Requested lab time not available.  After speaking to pt, will reschedule for 10/13/12 at 0800 for lab, 0815 for clinic.

## 2012-10-12 ENCOUNTER — Ambulatory Visit: Payer: BC Managed Care – PPO

## 2012-10-12 ENCOUNTER — Other Ambulatory Visit: Payer: BC Managed Care – PPO | Admitting: Lab

## 2012-10-13 ENCOUNTER — Other Ambulatory Visit (HOSPITAL_BASED_OUTPATIENT_CLINIC_OR_DEPARTMENT_OTHER): Payer: BC Managed Care – PPO | Admitting: Lab

## 2012-10-13 ENCOUNTER — Ambulatory Visit (HOSPITAL_BASED_OUTPATIENT_CLINIC_OR_DEPARTMENT_OTHER): Payer: BC Managed Care – PPO | Admitting: Pharmacist

## 2012-10-13 DIAGNOSIS — I2699 Other pulmonary embolism without acute cor pulmonale: Secondary | ICD-10-CM

## 2012-10-13 LAB — PROTIME-INR
INR: 1.6 — ABNORMAL LOW (ref 2.00–3.50)
Protime: 19.2 Seconds — ABNORMAL HIGH (ref 10.6–13.4)

## 2012-10-13 NOTE — Progress Notes (Signed)
INR subtherapeutic (1.6) on 7.5mg  daily No changes in meds or diet. Pt unsure why her INR is low.   Likely, since her INR was subtherapeutic when she was first referred to our clinic, she has not been getting enough Coumadin. Will increase dose to 7.5mg  daily except 11.25mg  (1.5 tablets) on MWF.  Recheck INR in 1 week to assess response.

## 2012-10-15 ENCOUNTER — Encounter: Payer: Self-pay | Admitting: Pharmacist

## 2012-10-15 NOTE — Progress Notes (Signed)
OK w/ Dr Myna Hidalgo for pt to have INR in [place] on 10/18/12 (lab & MD appt scheduled there that day) & [facility] pharmacist may call pt & discuss results & dosing over phone on 10/18/12. Marily Lente, Pharm.D.

## 2012-10-18 ENCOUNTER — Ambulatory Visit (HOSPITAL_BASED_OUTPATIENT_CLINIC_OR_DEPARTMENT_OTHER): Payer: BC Managed Care – PPO | Admitting: Hematology & Oncology

## 2012-10-18 ENCOUNTER — Other Ambulatory Visit (HOSPITAL_BASED_OUTPATIENT_CLINIC_OR_DEPARTMENT_OTHER): Payer: BC Managed Care – PPO | Admitting: Lab

## 2012-10-18 VITALS — BP 132/71 | HR 63 | Temp 97.9°F | Resp 17 | Ht 65.0 in | Wt 220.0 lb

## 2012-10-18 DIAGNOSIS — I2699 Other pulmonary embolism without acute cor pulmonale: Secondary | ICD-10-CM

## 2012-10-18 DIAGNOSIS — R894 Abnormal immunological findings in specimens from other organs, systems and tissues: Secondary | ICD-10-CM

## 2012-10-18 DIAGNOSIS — Z86711 Personal history of pulmonary embolism: Secondary | ICD-10-CM

## 2012-10-18 DIAGNOSIS — D509 Iron deficiency anemia, unspecified: Secondary | ICD-10-CM

## 2012-10-18 LAB — CBC WITH DIFFERENTIAL (CANCER CENTER ONLY)
BASO#: 0.1 10*3/uL (ref 0.0–0.2)
BASO%: 0.8 % (ref 0.0–2.0)
EOS%: 2.2 % (ref 0.0–7.0)
LYMPH#: 1.9 10*3/uL (ref 0.9–3.3)
LYMPH%: 29.8 % (ref 14.0–48.0)
MCH: 26.3 pg (ref 26.0–34.0)
MCHC: 32.2 g/dL (ref 32.0–36.0)
MCV: 82 fL (ref 81–101)
MONO%: 5.9 % (ref 0.0–13.0)
NEUT#: 3.8 10*3/uL (ref 1.5–6.5)
RBC: 4.56 10*6/uL (ref 3.70–5.32)

## 2012-10-18 NOTE — Progress Notes (Signed)
This office note has been dictated.

## 2012-10-19 ENCOUNTER — Ambulatory Visit (HOSPITAL_BASED_OUTPATIENT_CLINIC_OR_DEPARTMENT_OTHER): Payer: BC Managed Care – PPO | Admitting: Pharmacist

## 2012-10-19 DIAGNOSIS — I2699 Other pulmonary embolism without acute cor pulmonale: Secondary | ICD-10-CM

## 2012-10-19 LAB — PROTHROMBIN TIME
INR: 2.83 — ABNORMAL HIGH (ref <1.50)
Prothrombin Time: 28.5 seconds — ABNORMAL HIGH (ref 11.6–15.2)

## 2012-10-19 NOTE — Progress Notes (Signed)
  INR from 10/18/12. INR from yesterday within range but increased rather significantly in less than a week. Will slightly decrease dose to 7.5mg  daily except 11.25mg  on Mon&Fri.  Recheck INR in 10 days on 10/28/12; lab at 8am and coumadin clinic at 8:15am.  Refill on Warfarin 7.5mg  tablet [facility] Phone: [phone]

## 2012-10-19 NOTE — Patient Instructions (Addendum)
Slightly decrease dose to 7.5mg  daily except 11.25mg  on Mon&Fri.  Recheck INR in 10 days on 10/28/12; lab at 8am and coumadin clinic at 8:15am.

## 2012-10-19 NOTE — Progress Notes (Signed)
CC:   Kimberly Monroe, M.D.  DIAGNOSES: 1. Pulmonary embolism. 2. IgG anticardiolipin antibody. 3. Iron deficiency anemia.  CURRENT THERAPY: 1. Coumadin, 2 years' duration. 2. IV iron as indicated.  INTERIM HISTORY:  Ms. Ken comes in for followup.  The IV iron that we gave her did help her quite a bit.  When we first I think saw her, her hemoglobin was 10.1.  Her iron showed a ferritin of 16 and iron saturation of 5.  She did receive IV iron.  She says she felt better.  With her hypercoagulable studies, she does have an elevated IgG anticardiolipin antibody with a titer of 46.  This certainly could be a real finding.  She is doing okay on Coumadin.  Her Coumadin levels are being adjusted by the Coumadin Clinic.  She has had no problems of bleeding.  Unfortunately her daughter apparently was found to have a "giant cell tumor" on her hand.  She is going to have surgery for this.  She has had no "cravings".  Her appetite is okay.  She has had no fevers, sweats or chills.  PHYSICAL EXAMINATION:  General:  This is a well-developed, well- nourished white female in no obvious distress.  Vital signs:  Show temperature of 97.9, pulse 63, respiratory rate 18, blood pressure 132/71.  Weight is 220.  Head and neck:  Shows a normocephalic, atraumatic skull.  There are no ocular or oral lesions.  There are no palpable cervical or supraclavicular lymph nodes.  Lungs:  Clear bilaterally.  Cardiac:  Regular rate and rhythm with a normal S1, S2. She has a 1/6 systolic ejection murmur.  Abdomen:  Soft with good bowel sounds.  There is no palpable abdominal mass.  There is no palpable hepatosplenomegaly.  Back.  Extremities:  Shows no clubbing, cyanosis or edema.  Neurological:  Shows no focal neurological deficits.  LABORATORY STUDIES:  White cell count is 6.2, hemoglobin 12, hematocrit 37, platelet count 300.  MCV is 82.  Her last INR is 1.6.  IMPRESSION:  Ms. Savoia is a 52 year old  white female with an anticardiolipin antibody.  Again, it is hard to say if this is truly clinically significant but I would have to believe that this is a factor.  She had repeat radiological studies last year which did not show any residual thromboembolic events.  She has had I think a colonoscopy which was unremarkable.  Again, I believe that she is going to need 2 years of anticoagulation. I think given that she had a pulmonary embolism this increases the duration of anticoagulation from my point of view.  I think we can probably get her back in about 3 months' time now.  Will repeat her anticardiolipin antibodies when we see her back.    ______________________________ Josph Macho, M.D. PRE/MEDQ  D:  10/18/2012  T:  10/19/2012  Job:  [id number]

## 2012-10-28 ENCOUNTER — Other Ambulatory Visit: Payer: BC Managed Care – PPO

## 2012-10-28 ENCOUNTER — Ambulatory Visit: Payer: BC Managed Care – PPO

## 2012-10-28 ENCOUNTER — Telehealth: Payer: Self-pay | Admitting: Pharmacist

## 2012-10-28 NOTE — Telephone Encounter (Signed)
Called pt. Left VM. Asked her to reschedule lab/clinic apt from 10/28/12. Please call [phone] to reschedule.

## 2012-11-10 ENCOUNTER — Ambulatory Visit (HOSPITAL_BASED_OUTPATIENT_CLINIC_OR_DEPARTMENT_OTHER): Payer: BC Managed Care – PPO | Admitting: Pharmacist

## 2012-11-10 ENCOUNTER — Telehealth: Payer: Self-pay | Admitting: Hematology & Oncology

## 2012-11-10 ENCOUNTER — Ambulatory Visit (HOSPITAL_BASED_OUTPATIENT_CLINIC_OR_DEPARTMENT_OTHER): Payer: BC Managed Care – PPO | Admitting: Lab

## 2012-11-10 DIAGNOSIS — I2699 Other pulmonary embolism without acute cor pulmonale: Secondary | ICD-10-CM

## 2012-11-10 NOTE — Progress Notes (Signed)
INR slightly supratherapeutic (3.2) on 7.5mg  daily except 11.25 on MF. No changes in medications.  Decreased spinach and leafy green vegetable intake since her daughter has had surgery for a tumor in her wrist (increased consumption of fast food, etc.) No problems with bleeding or bruising.  Complains that she currently has a head cold.  She is not taking any OTC medication for the cold.  Will have pt continue current dose, and recheck INR in 2 weeks to ensure return to therapeutic range.

## 2012-11-11 ENCOUNTER — Ambulatory Visit: Payer: BC Managed Care – PPO

## 2012-11-11 ENCOUNTER — Other Ambulatory Visit: Payer: BC Managed Care – PPO

## 2012-11-20 ENCOUNTER — Other Ambulatory Visit: Payer: Self-pay

## 2012-11-24 ENCOUNTER — Ambulatory Visit: Payer: BC Managed Care – PPO

## 2012-11-24 ENCOUNTER — Other Ambulatory Visit: Payer: BC Managed Care – PPO | Admitting: Lab

## 2012-11-24 ENCOUNTER — Telehealth: Payer: Self-pay | Admitting: Pharmacist

## 2012-11-24 NOTE — Telephone Encounter (Signed)
LM to call back and reschedule her coumadin clinic appmt

## 2012-12-17 ENCOUNTER — Telehealth: Payer: Self-pay | Admitting: Pharmacist

## 2012-12-22 ENCOUNTER — Ambulatory Visit (HOSPITAL_BASED_OUTPATIENT_CLINIC_OR_DEPARTMENT_OTHER): Payer: BC Managed Care – PPO | Admitting: Pharmacist

## 2012-12-22 ENCOUNTER — Other Ambulatory Visit (HOSPITAL_BASED_OUTPATIENT_CLINIC_OR_DEPARTMENT_OTHER): Payer: BC Managed Care – PPO | Admitting: Lab

## 2012-12-22 DIAGNOSIS — I2699 Other pulmonary embolism without acute cor pulmonale: Secondary | ICD-10-CM

## 2012-12-22 LAB — PROTIME-INR: INR: 2.5 (ref 2.00–3.50)

## 2012-12-22 LAB — POCT INR: INR: 2.5

## 2012-12-22 MED ORDER — WARFARIN SODIUM 7.5 MG PO TABS: 7.5000 mg | ORAL_TABLET | Freq: Every day | ORAL | Status: DC

## 2012-12-22 NOTE — Progress Notes (Signed)
INR = 2.5 Pt states she has taken Coumadin 7.5 mg daily for the past 1 1/2 - 2 weeks.   She was almost out of 7.5 mg tablets so she didn't take 1 1/2 tabs on Mon/Fri as she had been on. She is getting ready to start topical 5-FU next week. INR is in goal so she will stay on 7.5 mg daily. I refilled her Coumadin 7.5 mg today. Repeat protime in 2 weeks.  It is possible the 5-FU may have +/- effect on the INR. Ebony Hail, Pharm.D., CPP 12/22/2012@10 :09 AM

## 2012-12-28 IMAGING — CR DG CHEST 2V
2 series · 2 of 2 positions shown · non-contrast
Comparison: 02/02/2012

CLINICAL DATA: Shortness of breath and history of pulmonary
embolism.

CHEST - 2 VIEW

[w chest pa]
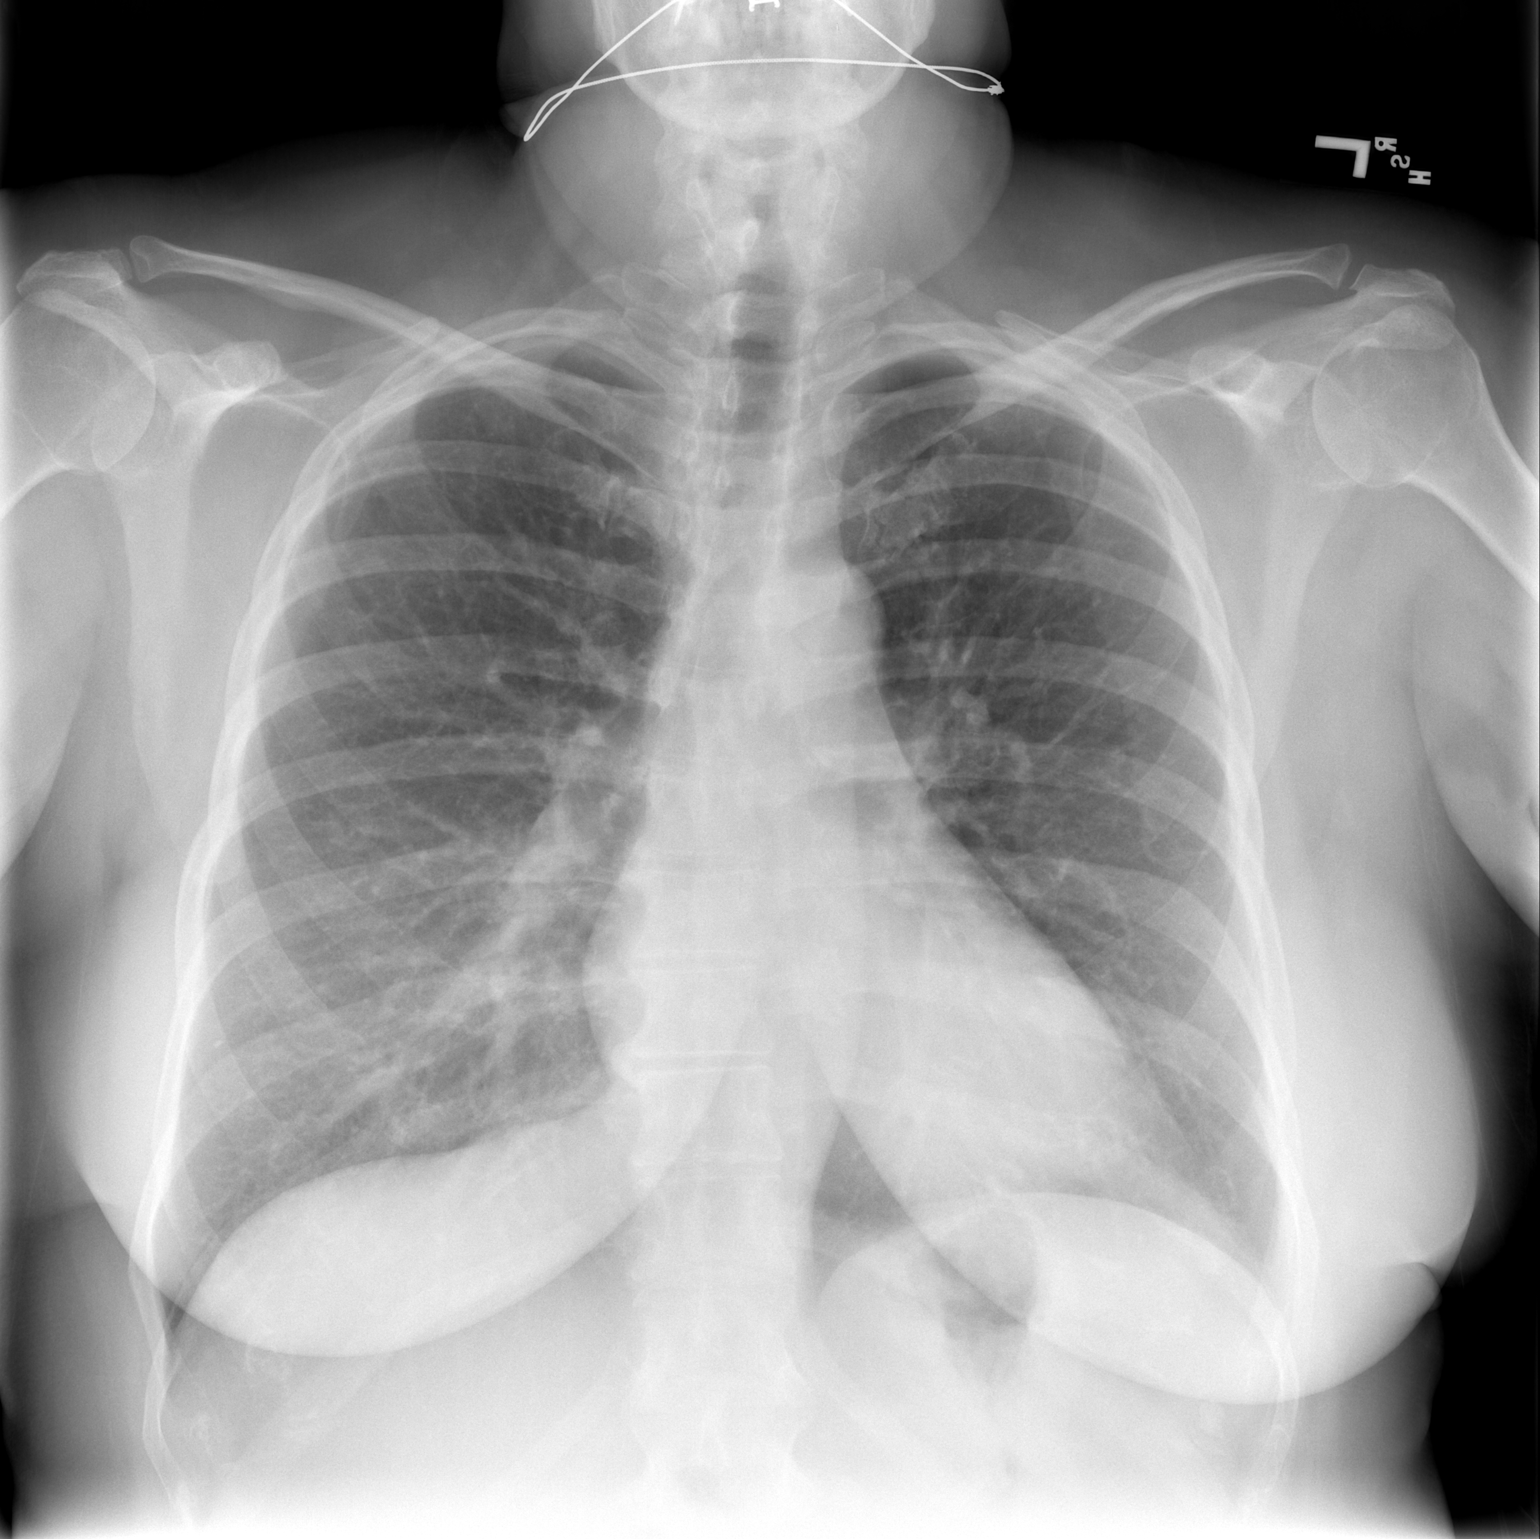

[w chest lat]
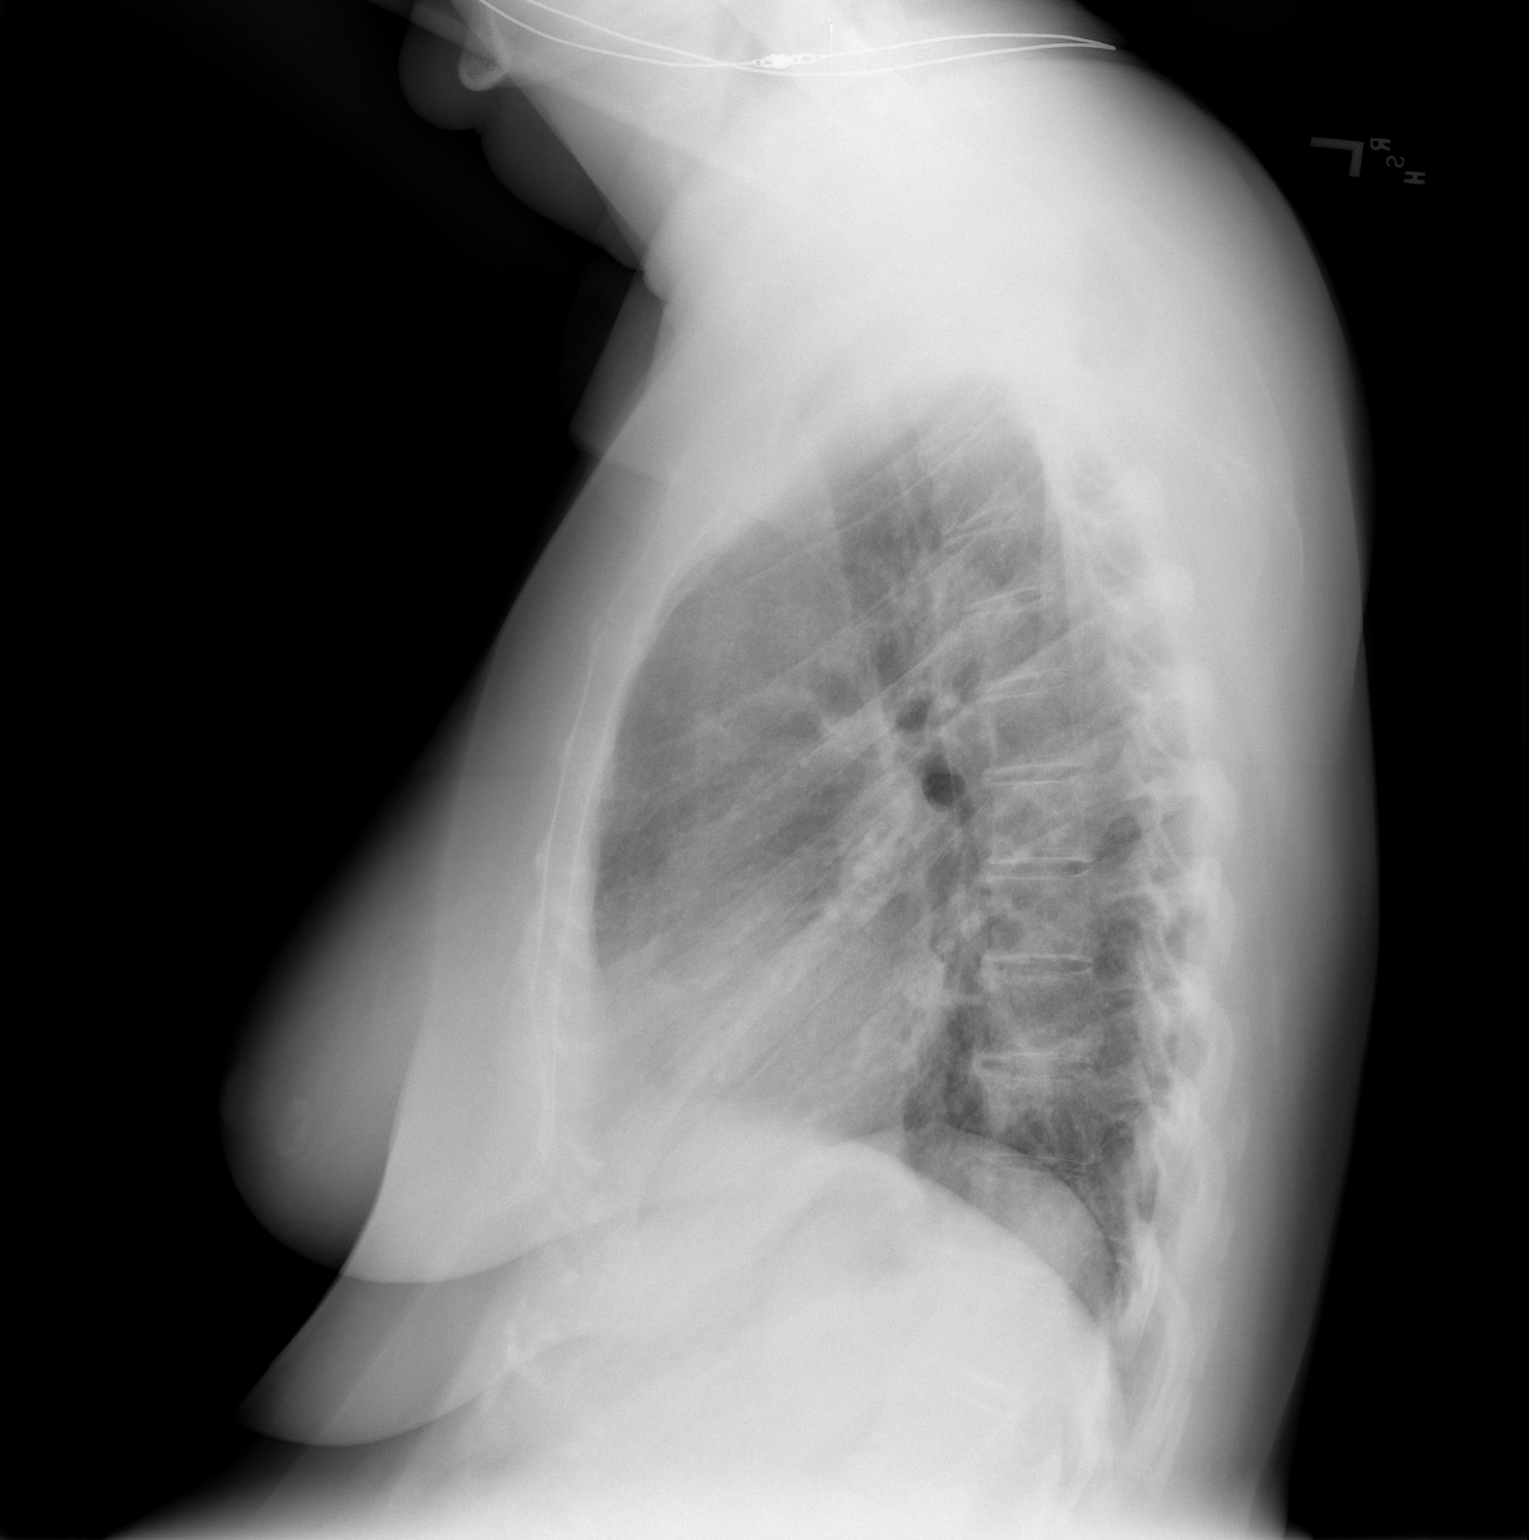

[2 of 2 positions shown; findings below may reference images not displayed]

FINDINGS: Two views of the chest were obtained.  The lungs are
clear without focal airspace disease or edema.  Negative for
pneumothorax. Heart and mediastinum are within normal limits.  The
trachea is midline.
IMPRESSION: No acute chest findings.

## 2013-01-05 ENCOUNTER — Other Ambulatory Visit: Payer: BC Managed Care – PPO | Admitting: Lab

## 2013-01-05 ENCOUNTER — Ambulatory Visit (HOSPITAL_BASED_OUTPATIENT_CLINIC_OR_DEPARTMENT_OTHER): Payer: BC Managed Care – PPO | Admitting: Pharmacist

## 2013-01-05 DIAGNOSIS — I2699 Other pulmonary embolism without acute cor pulmonale: Secondary | ICD-10-CM

## 2013-01-05 LAB — PROTIME-INR

## 2013-01-05 LAB — PROTHROMBIN TIME: Prothrombin Time: 37.3 seconds — ABNORMAL HIGH (ref 11.6–15.2)

## 2013-01-05 LAB — POCT INR: INR: 4.11

## 2013-01-05 NOTE — Patient Instructions (Addendum)
Hold your coumadin on 01/06/13.  Resume usual dose of 7.5mg  daily beginning on 01/07/13. Recheck INR on 01/12/13; lab at 8:30am and coumadin clnic at 8:45am.

## 2013-01-05 NOTE — Progress Notes (Signed)
INR above goal today. No problems regarding anticoagulation to report. Pt has had some headaches recently that she attributes to the increased pollen in the air on and off for the last week. She has not needed to take anything to treat the headaches. She also has been very busy and sometimes gets lightheaded/fatigued, at times. Pt has decreased her intake of Vitamin K rather substantially over the last week. She usually has spinach every day. Pt states she usually eats "tons of spinach". She has not had any spinach in ~ a week. She has been very busy and gotten out of routine.  She plans to resume her usual intake of spinach (Vitamin K) tonight. Pt already took coumadin today. Will hold coumadin tomorrow 01/06/13.  Will resume usual dose of Coumadin 7.5mg  daily beginning on 01/07/13. Recheck INR on 01/12/13; lab at 8:30am and coumadin clnic at 8:45am.

## 2013-01-12 ENCOUNTER — Ambulatory Visit (HOSPITAL_BASED_OUTPATIENT_CLINIC_OR_DEPARTMENT_OTHER): Payer: BC Managed Care – PPO | Admitting: Pharmacist

## 2013-01-12 ENCOUNTER — Other Ambulatory Visit (HOSPITAL_BASED_OUTPATIENT_CLINIC_OR_DEPARTMENT_OTHER): Payer: BC Managed Care – PPO | Admitting: Lab

## 2013-01-12 DIAGNOSIS — I2699 Other pulmonary embolism without acute cor pulmonale: Secondary | ICD-10-CM

## 2013-01-12 LAB — PROTIME-INR
INR: 3.7 — ABNORMAL HIGH (ref 2.00–3.50)
Protime: 44.4 Seconds — ABNORMAL HIGH (ref 10.6–13.4)

## 2013-01-12 LAB — POCT INR: INR: 3.7

## 2013-01-12 NOTE — Progress Notes (Signed)
INR = 3.7 after holding x 1 dose last week then restarting 7.5 mg/day as instructed = 45 mg total last week. Pt has incorporated back in more vit K rich foods. Still has not begun 5FU cream. She has bruise R side abdomen.  She noticed it on Sat PM.  It is nearly resolved today. States her incision from C-section (13 yrs ago) is bright red, almost 'like someone drew it on with a marker.'  This is new. Urine light tea color x 1 episode.  No disuria or urgency.  She is drinking a lot of water. INR remains supratherapeutic. Change dose to 3.75 mg MWF; 7.5 mg other days (41.25 mg/week). Call if she develops any s/sxs of bleeding otherwise return next week (her preference is Wed 4/16). Ebony Hail, Pharm.D., CPP 01/12/2013@9 :00 AM

## 2013-01-19 ENCOUNTER — Other Ambulatory Visit (HOSPITAL_BASED_OUTPATIENT_CLINIC_OR_DEPARTMENT_OTHER): Payer: BC Managed Care – PPO | Admitting: Lab

## 2013-01-19 ENCOUNTER — Ambulatory Visit (HOSPITAL_BASED_OUTPATIENT_CLINIC_OR_DEPARTMENT_OTHER): Payer: BC Managed Care – PPO | Admitting: Pharmacist

## 2013-01-19 DIAGNOSIS — I2699 Other pulmonary embolism without acute cor pulmonale: Secondary | ICD-10-CM

## 2013-01-19 LAB — POCT INR: INR: 2

## 2013-01-19 LAB — PROTIME-INR
INR: 2 (ref 2.00–3.50)
Protime: 24 Seconds — ABNORMAL HIGH (ref 10.6–13.4)

## 2013-01-19 NOTE — Progress Notes (Signed)
INR within goal today. Significant INR decrease in INR in 1 week. No problems to report regarding anticoagulation. No s/s of clotting. No changes in diet, medications, etc. No missed doses.  Will slightly increase coumadin to 7.5mg  daily except 3.75mg  on Mon & Fri to try to maintain therapeutic INR. Recheck INR on 02/01/13; lab at 8:30am and coumadin clnic at 8:45am.

## 2013-01-19 NOTE — Patient Instructions (Signed)
Slightly increase dose to 7.5mg  daily except 3.75mg  on Mon & Fri. Recheck INR on 02/01/13; lab at 8:30am and coumadin clnic at 8:45am.

## 2013-01-20 ENCOUNTER — Ambulatory Visit (HOSPITAL_BASED_OUTPATIENT_CLINIC_OR_DEPARTMENT_OTHER): Payer: BC Managed Care – PPO | Admitting: Hematology & Oncology

## 2013-01-20 ENCOUNTER — Other Ambulatory Visit (HOSPITAL_BASED_OUTPATIENT_CLINIC_OR_DEPARTMENT_OTHER): Payer: BC Managed Care – PPO | Admitting: Lab

## 2013-01-20 VITALS — BP 126/75 | HR 60 | Temp 98.5°F | Resp 16 | Ht 65.0 in | Wt 211.0 lb

## 2013-01-20 DIAGNOSIS — I2699 Other pulmonary embolism without acute cor pulmonale: Secondary | ICD-10-CM

## 2013-01-20 DIAGNOSIS — D6859 Other primary thrombophilia: Secondary | ICD-10-CM

## 2013-01-20 DIAGNOSIS — D509 Iron deficiency anemia, unspecified: Secondary | ICD-10-CM

## 2013-01-20 DIAGNOSIS — Z7901 Long term (current) use of anticoagulants: Secondary | ICD-10-CM

## 2013-01-20 LAB — CBC WITH DIFFERENTIAL (CANCER CENTER ONLY)
BASO#: 0.1 10*3/uL (ref 0.0–0.2)
BASO%: 0.6 % (ref 0.0–2.0)
EOS%: 2.3 % (ref 0.0–7.0)
Eosinophils Absolute: 0.2 10*3/uL (ref 0.0–0.5)
HGB: 12.9 g/dL (ref 11.6–15.9)
LYMPH#: 2.1 10*3/uL (ref 0.9–3.3)
MCH: 27.7 pg (ref 26.0–34.0)
MCHC: 33.2 g/dL (ref 32.0–36.0)
MONO%: 7.7 % (ref 0.0–13.0)
NEUT#: 5.7 10*3/uL (ref 1.5–6.5)
RBC: 4.66 10*6/uL (ref 3.70–5.32)
RDW: 15.3 % (ref 11.1–15.7)

## 2013-01-20 NOTE — Progress Notes (Signed)
This office note has been dictated.

## 2013-01-21 ENCOUNTER — Telehealth: Payer: Self-pay | Admitting: Oncology

## 2013-01-21 LAB — CARDIOLIPIN ANTIBODIES, IGG, IGM, IGA
Anticardiolipin IgG: 6 GPL U/mL (ref <23)
Anticardiolipin IgM: 2 MPL U/mL (ref <11)

## 2013-01-21 LAB — IRON AND TIBC
%SAT: 11 % — ABNORMAL LOW (ref 20–55)
Iron: 39 ug/dL — ABNORMAL LOW (ref 42–145)
TIBC: 343 ug/dL (ref 250–470)

## 2013-01-21 NOTE — Telephone Encounter (Addendum)
Message copied by Lacie Draft on Fri Jan 21, 2013  4:50 PM ------      Message from: Arlan Organ R      Created: Thu Jan 20, 2013  5:27 PM       Call - iron is borderline, but we can hold on IV iron for now!!!  Pete ------4:51 PM Left message. Teola Bradley, Murry Khiev [employer]

## 2013-01-21 NOTE — Progress Notes (Signed)
CC:   Evelena Peat, M.D.  DIAGNOSIS: 1. Anticardiolipin antibody syndrome - IgG subhepatic. 2. Pulmonary embolism. 3. Iron deficiency anemia - resolved.  CURRENT THERAPY: 1. Coumadin - to be given for 2 years. 2. IV iron as indicated.  INTERIM HISTORY:  Kimberly Monroe comes in for her followup.  Unfortunately, she is having a lot issues with her insurance company.  They apparently will not pay for testing with respect to the pulmonary embolism.  She has a hypercoagulable state.  She has an anticardiolipin antibody.  This clearly is the etiology for her pulmonary embolism.  I am just surprised that the insurance company is being so belligerent in paying for her testing.  We are following her Coumadin every couple weeks or so.  She had an INR of 2.0 yesterday.  She has had no bleeding.  She has had no nausea. There has been no cough or shortness of breath.  Apparently her dermatologist wants her to try topical 5-FU for her skin. I just would be reluctant for her to do this, as this is certainly could affect her Coumadin and could make it more difficult for [place] to manage her Coumadin.  She has had no leg swelling.  She has had no change in bowel or bladder habits.  PHYSICAL EXAMINATION:  General:  This is a well-developed, well- nourished white female in no obvious distress.  Vital signs: Temperature of 98.5, pulse 60, respiratory rate 16, blood pressure 126/75.  Weight is 211 pounds.  Head/neck:  A normocephalic, atraumatic skull.  There are no ocular or oral lesions.  There are no palpable cervical or supraclavicular lymph nodes.  Lungs:  Clear bilaterally. Cardiac:  Regular rate and rhythm with a normal S1 and S2.  There are no murmurs, rubs or bruits.  Abdomen:  Soft with good bowel sounds.  There is no palpable abdominal mass.  There is no palpable hepatosplenomegaly. Extremities:  No clubbing, cyanosis or edema.  Skin:  No rashes, ecchymosis or petechia.  There are no  suspicious hypopigmented lesions. Neurological:  No focal neurological deficits.  LABORATORY STUDIES:  White cell count is 8, hemoglobin is 13, hematocrit 39, platelet count 300,000.  MCV is 83.  IMPRESSION:  Ms. Patella is a very nice 52 year old white female with an anticardiolipin antibody.  She has a pulmonary embolism.  She was diagnosed back in May 2013.  Again, I feel 2 years is what is going to be necessary for her.  We will go ahead and plan to get her back to see [place] in another 3 months or so.  We will continue to follow her INR every 2 weeks or so.    ______________________________ Josph Macho, M.D. PRE/MEDQ  D:  01/20/2013  T:  01/21/2013  Job:  [id number]

## 2013-01-24 ENCOUNTER — Encounter: Payer: Self-pay | Admitting: Pharmacist

## 2013-01-31 ENCOUNTER — Other Ambulatory Visit: Payer: Self-pay | Admitting: *Deleted

## 2013-01-31 ENCOUNTER — Telehealth: Payer: Self-pay | Admitting: *Deleted

## 2013-01-31 DIAGNOSIS — D509 Iron deficiency anemia, unspecified: Secondary | ICD-10-CM
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
## 2013-02-01 ENCOUNTER — Other Ambulatory Visit (HOSPITAL_BASED_OUTPATIENT_CLINIC_OR_DEPARTMENT_OTHER): Payer: BC Managed Care – PPO | Admitting: Lab

## 2013-02-01 ENCOUNTER — Ambulatory Visit: Payer: BC Managed Care – PPO | Admitting: Pharmacist

## 2013-02-01 DIAGNOSIS — I2699 Other pulmonary embolism without acute cor pulmonale: Secondary | ICD-10-CM

## 2013-02-01 DIAGNOSIS — D509 Iron deficiency anemia, unspecified: Secondary | ICD-10-CM

## 2013-02-01 LAB — POCT INR: INR: 2.2

## 2013-02-01 LAB — IRON AND TIBC
Iron: 29 ug/dL — ABNORMAL LOW (ref 42–145)
UIBC: 354 ug/dL (ref 125–400)

## 2013-02-01 LAB — PROTIME-INR
INR: 2.2 (ref 2.00–3.50)
Protime: 26.4 Seconds — ABNORMAL HIGH (ref 10.6–13.4)
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
## 2013-02-03 ENCOUNTER — Telehealth: Payer: Self-pay | Admitting: Hematology & Oncology

## 2013-02-03 ENCOUNTER — Other Ambulatory Visit: Payer: Self-pay | Admitting: *Deleted

## 2013-02-03 DIAGNOSIS — D509 Iron deficiency anemia, unspecified: Secondary | ICD-10-CM

## 2013-02-03 NOTE — Telephone Encounter (Signed)
I called and left a message that the iron was low and that she would need some IV iron.  I told her in the message that she needs to call us and we can set up the iron infusion at her convenience.  Hewitt Shorts

## 2013-02-04 ENCOUNTER — Other Ambulatory Visit: Payer: Self-pay | Admitting: *Deleted

## 2013-02-04 ENCOUNTER — Ambulatory Visit (HOSPITAL_BASED_OUTPATIENT_CLINIC_OR_DEPARTMENT_OTHER): Payer: BC Managed Care – PPO

## 2013-02-04 DIAGNOSIS — D509 Iron deficiency anemia, unspecified: Secondary | ICD-10-CM

## 2013-02-04 DIAGNOSIS — N920 Excessive and frequent menstruation with regular cycle: Secondary | ICD-10-CM

## 2013-02-04 DIAGNOSIS — Z7901 Long term (current) use of anticoagulants: Secondary | ICD-10-CM

## 2013-02-04 DIAGNOSIS — I2699 Other pulmonary embolism without acute cor pulmonale: Secondary | ICD-10-CM

## 2013-02-04 MED ORDER — SODIUM CHLORIDE 0.9 % IV SOLN
1020.0000 mg | Freq: Once | INTRAVENOUS | Status: AC
  Administered 2013-02-04: 1020 mg via INTRAVENOUS
  Filled 2013-02-04: qty 34

## 2013-02-04 MED ORDER — SODIUM CHLORIDE 0.9 % IV SOLN
Freq: Once | INTRAVENOUS | Status: AC
  Administered 2013-02-04: 10:00:00 via INTRAVENOUS

## 2013-02-04 NOTE — Patient Instructions (Addendum)
Ferumoxytol injection What is this medicine? FERUMOXYTOL is an iron complex. Iron is used to make healthy red blood cells, which carry oxygen and nutrients throughout the body. This medicine is used to treat iron deficiency anemia in people with chronic kidney disease. This medicine may be used for other purposes; ask your health care provider or pharmacist if you have questions. What should I tell my health care provider before I take this medicine? They need to know if you have any of these conditions: -anemia not caused by low iron levels -high levels of iron in the blood -magnetic resonance imaging (MRI) test scheduled -an unusual or allergic reaction to iron, other medicines, foods, dyes, or preservatives -pregnant or trying to get pregnant -breast-feeding How should I use this medicine? This medicine is for infusion into a vein. It is given by a health care professional in a hospital or clinic setting. Talk to your pediatrician regarding the use of this medicine in children. Special care may be needed. Overdosage: If you think you've taken too much of this medicine contact a poison control center or emergency room at once. Overdosage: If you think you have taken too much of this medicine contact a poison control center or emergency room at once. NOTE: This medicine is only for you. Do not share this medicine with others. What if I miss a dose? It is important not to miss your dose. Call your doctor or health care professional if you are unable to keep an appointment. What may interact with this medicine? This medicine may interact with the following medications: -other iron products This list may not describe all possible interactions. Give your health care provider a list of all the medicines, herbs, non-prescription drugs, or dietary supplements you use. Also tell them if you smoke, drink alcohol, or use illegal drugs. Some items may interact with your medicine. What should I watch  for while using this medicine? Visit your doctor or healthcare professional regularly. Tell your doctor or healthcare professional if your symptoms do not start to get better or if they get worse. You may need blood work done while you are taking this medicine. You may need to follow a special diet. Talk to your doctor. Foods that contain iron include: whole grains/cereals, dried fruits, beans, or peas, leafy green vegetables, and organ meats (liver, kidney). What side effects may I notice from receiving this medicine? Side effects that you should report to your doctor or health care professional as soon as possible: -allergic reactions like skin rash, itching or hives, swelling of the face, lips, or tongue -breathing problems -changes in blood pressure -feeling faint or lightheaded, falls -fever or chills -flushing, sweating, or hot feelings -swelling of the ankles or feet Side effects that usually do not require medical attention (Report these to your doctor or health care professional if they continue or are bothersome.): -diarrhea -headache -nausea, vomiting -stomach pain This list may not describe all possible side effects. Call your doctor for medical advice about side effects. You may report side effects to FDA at 1-800-FDA-1088. Where should I keep my medicine? This drug is given in a hospital or clinic and will not be stored at home. NOTE: This sheet is a summary. It may not cover all possible information. If you have questions about this medicine, talk to your doctor, pharmacist, or health care provider.  2013, Elsevier/Gold Standard. (06/14/2008 9:48:25 PM)  

## 2013-02-04 NOTE — Progress Notes (Signed)
Referral made to Dr Leda Quail per pt's request.

## 2013-02-07 ENCOUNTER — Other Ambulatory Visit: Payer: Self-pay | Admitting: Obstetrics & Gynecology

## 2013-02-07 ENCOUNTER — Ambulatory Visit (INDEPENDENT_AMBULATORY_CARE_PROVIDER_SITE_OTHER): Payer: BC Managed Care – PPO

## 2013-02-07 ENCOUNTER — Encounter: Payer: Self-pay | Admitting: Obstetrics & Gynecology

## 2013-02-07 ENCOUNTER — Ambulatory Visit (INDEPENDENT_AMBULATORY_CARE_PROVIDER_SITE_OTHER): Payer: BC Managed Care – PPO | Admitting: Obstetrics & Gynecology

## 2013-02-07 ENCOUNTER — Other Ambulatory Visit: Payer: Self-pay | Admitting: *Deleted

## 2013-02-07 VITALS — BP 138/82 | Ht 64.5 in | Wt 207.4 lb

## 2013-02-07 DIAGNOSIS — N852 Hypertrophy of uterus: Secondary | ICD-10-CM

## 2013-02-07 DIAGNOSIS — D251 Intramural leiomyoma of uterus: Secondary | ICD-10-CM

## 2013-02-07 DIAGNOSIS — N920 Excessive and frequent menstruation with regular cycle: Secondary | ICD-10-CM

## 2013-02-07 DIAGNOSIS — D250 Submucous leiomyoma of uterus: Secondary | ICD-10-CM

## 2013-02-07 DIAGNOSIS — N838 Other noninflammatory disorders of ovary, fallopian tube and broad ligament: Secondary | ICD-10-CM

## 2013-02-07 DIAGNOSIS — N839 Noninflammatory disorder of ovary, fallopian tube and broad ligament, unspecified: Secondary | ICD-10-CM

## 2013-02-07 DIAGNOSIS — D3911 Neoplasm of uncertain behavior of right ovary: Secondary | ICD-10-CM

## 2013-02-07 DIAGNOSIS — D259 Leiomyoma of uterus, unspecified: Secondary | ICD-10-CM

## 2013-02-07 DIAGNOSIS — D3910 Neoplasm of uncertain behavior of unspecified ovary: Secondary | ICD-10-CM

## 2013-02-07 DIAGNOSIS — D509 Iron deficiency anemia, unspecified: Secondary | ICD-10-CM

## 2013-02-07 NOTE — Progress Notes (Signed)
52 y.o. [id number] MWF here for a pelvic ultrasound with sonohystogram and possible endometrial biopsy to further evaluate menorrhagia, h/o iron deficiency anemia, enlarged uterus on physical exam.  See note from earlier today.  Patient's last menstrual period was 01/24/2013.  Technique:  Both transabdominal and transvaginal ultrasound  examinations of the pelvis were performed. Transabdominal technique  was performed for global imaging of the pelvis including uterus,  ovaries, adnexal regions, and pelvic cul-de-sac.  It was necessary to proceed with endovaginal exam following the abdominal ultrasound transabdominal exam to visualize the endometrium and adnexa.  Color and duplex Doppler ultrasound was utilized to evaluate blood  flow to the ovaries.    FINDINGS:  UTERUS: 13.2 x 5.7 x 6.9cm with multiple fibroids around 2 to 2.5cm.  One appears to displace the endometrial cavity.  Volume of uterus 275. EMS:  Appears thickened ADNEXA:  Left ovary atrophic measuring 3.2 x 2.8 x 1.7cm, right ovary 5.6 x 5.4 x 4.2cm with 4.6 x 3.4 x 3.8 cystic mass with thin septations, avascular CUL DE SAC: neg  SHSG:  After obtaining appropriate verbal consent from patient, the cervix was visualized using a speculum, and prepped with betadine.  A tenaculum  was applied to the cervix.  Dilation of the cervix was not necessary. The catheter was passed into the uterus and sterile saline introduced, with the following findings:  Approximately 2.7cm fibroid into endometrial cavity, about 50% of fibroid appears to be in cavity  Endometrial biopsy recommended.  Discussed with patient.  Verbal and written consent obtained.   Procedure:  Speculum placed.  Cervix visualized and cleansed with betadine prep.  A single toothed tenaculum was applied to the anterior lip of the cervix.  Endometrial pipelle was advanced through the cervix into the endometrial cavity without difficulty.  Pipelle passed to 9cm.  Suction applied and  pipelle removed with good tissue sample obtained.  Tenculum removed.  Small amount of bright red bleeding noted.  Patient tolerated procedure well.  Assessment:  Menorrhagia, anemia, fibroids including submucosal, right ovarian mass, complex past medical and surgical history  Plan:  Ca-125 today Endometrial biopsy pathology pending Attempt will be made to obtain outside records regarding both cesarean section and ovarian surgeries Feel hysterectomy is probably best option for patient but does contain risks due to PE hx and patient being on chronic anticoagulation.  Endometrial ablation is a possibility IF she has not had a classical cesarean section.  IUD would not be a good option until submucosal fibroid is resected.  Pt may ultimately benefit from referral to tertiary care facility.  She voices clear understanding of above.  Very lengthy visit. In total, in excess of 90 minutes spent with patient >50% of time was in face to face discussion of above.
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
## 2013-02-08 ENCOUNTER — Telehealth: Payer: Self-pay | Admitting: Obstetrics & Gynecology

## 2013-02-08 NOTE — Telephone Encounter (Signed)
Calling again for results

## 2013-02-08 NOTE — Telephone Encounter (Signed)
Spoke with patient-let her know her ca125 is normal. We will call her with her pathology later in the week. She is fine with this, will wait for our call.

## 2013-02-08 NOTE — Telephone Encounter (Signed)
Patient returning someone's call. She states they did not leave a message. Patient reports she had ca125 testing done and is expecting results.

## 2013-02-10 ENCOUNTER — Encounter: Payer: Self-pay | Admitting: Obstetrics & Gynecology

## 2013-02-10 DIAGNOSIS — N920 Excessive and frequent menstruation with regular cycle: Secondary | ICD-10-CM

## 2013-02-10 HISTORY — DX: Excessive and frequent menstruation with regular cycle: N92.0

## 2013-02-10 NOTE — Patient Instructions (Signed)
Return at 2 o'clock for your ultrasound.

## 2013-02-10 NOTE — Patient Instructions (Signed)
We will call with your results.  

## 2013-02-11 ENCOUNTER — Telehealth: Payer: Self-pay | Admitting: *Deleted

## 2013-02-11 NOTE — Telephone Encounter (Signed)
Message copied by Alisa Graff on Fri Feb 11, 2013 10:49 AM ------      Message from: Jerene Bears      Created: Thu Feb 10, 2013 12:55 PM       Kennon Rounds,      This is the pt [name] referred.  Can you let he know endometrial biopsy is negative.  Tresa Endo already told her about the ca-125.  She needs to be referred to [facility] to the fibroid clinic.  Thank you. ------

## 2013-02-11 NOTE — Telephone Encounter (Signed)
Patient called states she is still with bleeding from procedures done on Monday. Having to use mini pad, change through out day. When urinates and wipes still with bleeding. request to know of endo biopsy results. Patient informed this was negative and CA125 results were given earlier . infor routed to [place] concerning referral to [facility].

## 2013-02-11 NOTE — Telephone Encounter (Signed)
Pt is on coumadin and has really heavy bleeding so I am not surprised by the spotting.  I am not alarmed by this.

## 2013-02-11 NOTE — Telephone Encounter (Signed)
Pt says she is bleeding. Please call

## 2013-02-11 NOTE — Telephone Encounter (Signed)
Amy, clinical staff,  at Dr Gustavo Lah office notified of normal Endo bx, CA125 results and referral to fibroid clinic in [facility].

## 2013-02-14 NOTE — Telephone Encounter (Signed)
Spoke with pt re: Dr. Rondel Baton advice. Pt states bleeding has improved.

## 2013-02-15 NOTE — Telephone Encounter (Signed)
Is this taken care of?  Have we heard anything from [facility]

## 2013-02-15 NOTE — Telephone Encounter (Signed)
Patient has a question for Dr. Hyacinth Meeker (no details given) Patient only wants to talk with Dr. Hyacinth Meeker only

## 2013-02-15 NOTE — Telephone Encounter (Signed)
Additional info from prev call. Explained that referral has been sent and that record are reviewed by MD before scheduling at fibroid clinic.Patient concerned that I am not sending her to the correct place.  She thinks she is to go to GYN/ONC.  Advised I will confirm with dr Hyacinth Meeker and will check on referral status at [facility].   Confirmed with Dr Hyacinth Meeker, at this point, with negative tumor markers, Dr Hyacinth Meeker wants to send her to fibroid clinic.  If patient has any additional medical records, please send to [place] for review. Call to [facility], spoke with Gerarda Gunther, they are reviewing her notes to determine urgency of appt and if they can use a "blocked" slot for her.  Should have answer by tomm.  They do have it listed as an urgent referral. They are currently scheduling routine appointments in July. Patient updated on referral status, and urged her to send or have sent any additional records that she can so dr Hyacinth Meeker will have a complete picture.  Patient now questionong if there is a specific MD at this clinic that she was to see.  Advised that he fibroid clinic is the "speciality group" and that we will need to get her in with the "group" at whatever appointment they can work her in for.  Reassured that Dr Hyacinth Meeker comfortable with all the providers in the clinic.

## 2013-02-15 NOTE — Telephone Encounter (Signed)
Please check with pt.

## 2013-02-15 NOTE — Telephone Encounter (Signed)
Call to patient and advised dr Kimberly Monroe in clinic all day.  Patient states she was under the impression that we would have her an appointment with the specialist this week so that she could have surgery within the next week or two due to her very complicated history.

## 2013-02-16 ENCOUNTER — Telehealth: Payer: Self-pay | Admitting: Hematology & Oncology

## 2013-02-16 NOTE — Telephone Encounter (Signed)
Spoke with pt about medical records. Instructed pt she can come by and sign a release, then she can have copies of her visit here and results of tests that were done. Pt agreeable. Pt has already gotten copies of records from [name]. Pt will come by tomorrow. Pt called [facility] back and got an appt for Thurs at 9:15, and was put on the cancellation list for Monday.

## 2013-02-16 NOTE — Telephone Encounter (Signed)
Spoke with pt about appt at [facility]. Pt reports a scheduler from [facility] called her this morning offering her a Monday appt. Pt was confused because she thought she would be hearing from our office first, so she did not schedule at that time. Encouraged pt to call the number they gave her to accept the appt because they can be difficult to get a quick appt with. Pt wants to come by and get copies of her medical records to take with her if possible. Offered to fax what we have directly to the office again, but pt would like copies for herself. [place] and has had trouble getting older medical records and states she has "learned her lesson." Instructed I would check on her getting records and get back with her. Pt will call [facility] back and secure appt.

## 2013-02-16 NOTE — Telephone Encounter (Signed)
Pt here today to complete/sign an Authorization for disclosure health information for Dr. Gustavo Lah clincial notes.  COPY SCANNED

## 2013-02-21 ENCOUNTER — Telehealth: Payer: Self-pay | Admitting: Obstetrics & Gynecology

## 2013-02-21 ENCOUNTER — Telehealth: Payer: Self-pay | Admitting: *Deleted

## 2013-02-21 NOTE — Telephone Encounter (Signed)
Patient notified that note received from MD in [place]/[facility]/ that since patient last seen in 1996, records no longer available.

## 2013-02-21 NOTE — Telephone Encounter (Signed)
Re: appt 02/24/13 at [facility] [place] [facility].  Drs. Office needs last bx faxed to: [phone] ph [phone].

## 2013-02-24 ENCOUNTER — Telehealth: Payer: Self-pay | Admitting: Obstetrics & Gynecology

## 2013-02-24 ENCOUNTER — Ambulatory Visit: Payer: BC Managed Care – PPO

## 2013-02-24 ENCOUNTER — Other Ambulatory Visit: Payer: BC Managed Care – PPO | Admitting: Lab

## 2013-02-24 NOTE — Telephone Encounter (Signed)
I left a message for patient to call regarding the medical release she signed in our office. The medical release was mailed to [facility] and rejected. I received a call from "Liliane Bade" in medical records stating that the patient will need to call Medical Records @ [phone] to give consent for the release. Liliane Bade says the signature on the release signed in our office does not match the signature that they have on file. I told Liliane Bade I witnessed the patient sign the release, he said she will still need to call and give consent.

## 2013-02-25 ENCOUNTER — Other Ambulatory Visit: Payer: Self-pay | Admitting: *Deleted

## 2013-02-25 DIAGNOSIS — I2699 Other pulmonary embolism without acute cor pulmonale: Secondary | ICD-10-CM

## 2013-02-25 MED ORDER — FONDAPARINUX SODIUM 7.5 MG/0.6ML ~~LOC~~ SOLN: 7.5000 mg | SUBCUTANEOUS | Status: DC

## 2013-02-25 NOTE — Telephone Encounter (Signed)
To have an open TAH Bilat S&O on 03/29/13. To stop Coumadin on 03/22/13 and start Arixtra 7.5 mg daily. Last dose of Arixtra to be on 03/28/13 and resume post-op day 1 03/30/13 with Coumadin. Once discharged from the hospital, to continue f/u with the Coumadin Clinic. May stop Arixtra once her INR is therapeutic per Dr Myna Hidalgo.

## 2013-03-02 ENCOUNTER — Encounter: Payer: Self-pay | Admitting: Obstetrics & Gynecology

## 2013-03-02 ENCOUNTER — Ambulatory Visit (HOSPITAL_BASED_OUTPATIENT_CLINIC_OR_DEPARTMENT_OTHER): Payer: BC Managed Care – PPO | Admitting: Pharmacist

## 2013-03-02 ENCOUNTER — Other Ambulatory Visit (HOSPITAL_BASED_OUTPATIENT_CLINIC_OR_DEPARTMENT_OTHER): Payer: BC Managed Care – PPO | Admitting: Lab

## 2013-03-02 DIAGNOSIS — I2699 Other pulmonary embolism without acute cor pulmonale: Secondary | ICD-10-CM

## 2013-03-02 LAB — PROTIME-INR
INR: 2.7 (ref 2.00–3.50)
Protime: 32.4 Seconds — ABNORMAL HIGH (ref 10.6–13.4)

## 2013-03-02 LAB — POCT INR: INR: 2.7

## 2013-03-02 NOTE — Progress Notes (Signed)
INR = 2.7 on 7.5 mg/day; 3.75 mg on Mon/Fri Pt is having TAH/?BSO at [facility] on 03/29/13 She was prescribed Arixtra 7.5 mg inj daily by [name] already; she has had them filled. I have discussed bridging plan w/ [name]: Continue on same dose of Coumadin for now. Stop Coumadin 03/23/13 Begin Arixtra 7.5 mg SQ daily 03/24/13 - 03/27/13 No anticoagulation on 03/28/13 Surgery 03/29/13 Restart anticoag per [facility] hospital on 03/30/13. Return to [facility] Coumadin clinic 04/07/13 unless she needs to change this appt. I have discussed the plan w/ pt & she repeated the plan back to me in understanding. Ebony Hail, Pharm.D., CPP 03/02/2013@11 :39 AM

## 2013-03-04 NOTE — Telephone Encounter (Signed)
Amy, this came to my in box. Do I need to do anything with this or have you completed it all for her?

## 2013-03-06 HISTORY — PX: ABDOMINAL HYSTERECTOMY: SHX81

## 2013-03-11 ENCOUNTER — Telehealth: Payer: Self-pay | Admitting: Obstetrics & Gynecology

## 2013-03-11 ENCOUNTER — Ambulatory Visit (HOSPITAL_BASED_OUTPATIENT_CLINIC_OR_DEPARTMENT_OTHER): Payer: BC Managed Care – PPO | Admitting: Hematology & Oncology

## 2013-03-11 ENCOUNTER — Other Ambulatory Visit: Payer: Self-pay | Admitting: *Deleted

## 2013-03-11 ENCOUNTER — Telehealth: Payer: Self-pay | Admitting: Hematology & Oncology

## 2013-03-11 ENCOUNTER — Ambulatory Visit (HOSPITAL_BASED_OUTPATIENT_CLINIC_OR_DEPARTMENT_OTHER): Payer: BC Managed Care – PPO

## 2013-03-11 ENCOUNTER — Ambulatory Visit (HOSPITAL_BASED_OUTPATIENT_CLINIC_OR_DEPARTMENT_OTHER): Payer: BC Managed Care – PPO | Admitting: Lab

## 2013-03-11 VITALS — BP 121/76 | HR 61 | Temp 97.9°F | Resp 16 | Ht 64.0 in | Wt 207.0 lb

## 2013-03-11 DIAGNOSIS — Z7901 Long term (current) use of anticoagulants: Secondary | ICD-10-CM

## 2013-03-11 DIAGNOSIS — I2699 Other pulmonary embolism without acute cor pulmonale: Secondary | ICD-10-CM

## 2013-03-11 DIAGNOSIS — D509 Iron deficiency anemia, unspecified: Secondary | ICD-10-CM

## 2013-03-11 DIAGNOSIS — N920 Excessive and frequent menstruation with regular cycle: Secondary | ICD-10-CM

## 2013-03-11 DIAGNOSIS — C439 Malignant melanoma of skin, unspecified: Secondary | ICD-10-CM

## 2013-03-11 DIAGNOSIS — R5381 Other malaise: Secondary | ICD-10-CM

## 2013-03-11 DIAGNOSIS — Z85828 Personal history of other malignant neoplasm of skin: Secondary | ICD-10-CM

## 2013-03-11 DIAGNOSIS — D6859 Other primary thrombophilia: Secondary | ICD-10-CM

## 2013-03-11 LAB — CBC WITH DIFFERENTIAL (CANCER CENTER ONLY)
BASO#: 0 10*3/uL (ref 0.0–0.2)
EOS%: 0.6 % (ref 0.0–7.0)
Eosinophils Absolute: 0.1 10*3/uL (ref 0.0–0.5)
HGB: 11.4 g/dL — ABNORMAL LOW (ref 11.6–15.9)
LYMPH#: 1.8 10*3/uL (ref 0.9–3.3)
LYMPH%: 19.3 % (ref 14.0–48.0)
MONO#: 0.4 10*3/uL (ref 0.1–0.9)
NEUT#: 7.1 10*3/uL — ABNORMAL HIGH (ref 1.5–6.5)
RBC: 3.85 10*6/uL (ref 3.70–5.32)
WBC: 9.4 10*3/uL (ref 3.9–10.0)

## 2013-03-11 LAB — PROTIME-INR (CHCC SATELLITE): Protime: 55.2 Seconds — ABNORMAL HIGH (ref 10.6–13.4)

## 2013-03-11 MED ORDER — PHYTONADIONE 10 MG/ML INJECTION
0.5000 mg | Freq: Once | INTRAMUSCULAR | Status: AC
  Administered 2013-03-11: 0.5 mg via SUBCUTANEOUS

## 2013-03-11 MED ORDER — SODIUM CHLORIDE 0.9 % IV SOLN
INTRAVENOUS | Status: DC
  Administered 2013-03-11: 500 mL via INTRAVENOUS

## 2013-03-11 MED ORDER — VITAMIN K 100 MCG PO TABS: 100.0000 ug | ORAL_TABLET | Freq: Every day | ORAL | Status: DC

## 2013-03-11 NOTE — Telephone Encounter (Signed)
error 

## 2013-03-11 NOTE — Progress Notes (Unsigned)
Pt called and stated she has been seen in [facility] and her surgery is scheduled for June 24. She is currently on Coumadin. Over the past 3 days she has been experiencing heavy vaginal bleeding and passing some clots. She is worried bc she is beginning to feel weak and drowsy. Per [name] pt will come in for a cbc, Pt/INR to evaluate her status. She may require an exam by French Ana, NP or referred to her primary GYN depending on results. Pt verbalized understanding and has been scheduled per Raiford Noble for a lab appointment today.

## 2013-03-11 NOTE — Telephone Encounter (Signed)
Patient calling with complaints of heavy vaginal bleeding for 6 of last 8 days.  She has been referred to [facility] and is scheduled for surgery 03-29-13 so she is being transitioned off coumadin and onto injections in preparation for surgery.  States passing clots the size of her fist and blood will pour down her legs.  Fainted yesterday.  Feels weak and dehydrated, states she cant drink enough fluid. Called [facility] four times but has been unsuccessful getting through.  Currently sitting in dr Gustavo Lah office.  Advised patient to stay there and let them evaluate her and they will contact us if they need [place] and I will alert Dr Tresa Res since Dr Hyacinth Meeker is out of office. Patient agreeable.

## 2013-03-11 NOTE — Telephone Encounter (Signed)
Patient left a message at lunch she is having "very heavy bleeding and large clots getting worse over the last week" and is going to see [name] right away. Patient needs to know what she should do? Patient is at his office now.

## 2013-03-11 NOTE — Progress Notes (Signed)
This office note has been dictated.

## 2013-03-12 NOTE — Progress Notes (Signed)
DIAGNOSES: 1. 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 2. Menometrorrhagia. 3. Iron deficiency. Ms. Heringer comes in for an unscheduled visit.  She is set up for surgery at [facility] because of a, I think, right ovarian mass.  This does not appear to be malignant, according to her gynecologist.  She is on long-term Coumadin.  We have her INR checked frequently. Apparently, she started having a lot of uterine bleeding.  This has been going on for about 3 days.  She initially tried to get hold of [facility], but could not.  She was then told to come in and talk to [place] because she is on Coumadin.  She does feel tired.  She feels quite worn out.  One has to suspect that she might have some degree of iron deficiency. This has been a problem for her in the past.  The patient last got iron back in early May.  She is on Coumadin at 7.5 mg a day for most of the week, and then 3.375 mg Monday and Friday.  There is no bleeding otherwise.  Again, she just feels very tired.  We got her into the office today.  A CBC was done which showed a white cell count of 9.4, hemoglobin 11.4, hematocrit 34.3, platelet count 274,000.  Her INR is 4.6.  PHYSICAL EXAMINATION:  Vital Signs:  Her temperature is 97.9, pulse 61, respiratory rate 16, blood pressure 121/76.  Head and neck:  No ocular or oral lesions.  There are no palpable cervical or supraclavicular lymph nodes.  Lungs:  Clear bilaterally.  Cardiac:  Regular rate and rhythm, with normal S1, S2.  There are no murmurs, rubs or bruits. Abdomen:  Soft.  There may be some slight tenderness over on the right side.  There may be some slight fullness on the right side of the abdomen.  There is no guarding or rebound tenderness.  There is no palpable hepatosplenomegaly.  Extremities:  Show no clubbing, cyanosis or edema.  Skin:  No rashes, ecchymosis, or petechia.  Neurologic:  No focal neurological  deficits.  IMPRESSION:  Ms. Brevik is a 52 year old white female who is on long-term Coumadin.  She is supratherapeutic on her Coumadin.  I went ahead and gave her 0.5 mg of vitamin K in the office.  I told her to stop her Coumadin for a couple days and to adjust her dose downward.  I did give her some IV fluids.  I suspect she is iron deficient again.  We could not give her IV iron in the office today.  We will have to try to get this done on Monday.  I also will put her on some oral vitamin K.  I will put her on 0.1 mg a day.  This it to try to help better regulate any bleeding from the Coumadin.  I told her that this is one of the side effects of Coumadin, in which levels are okay one week, and then off another week, with no apparent change in medications or diet.  Again, she got IV fluids in the office.  She did feel better when she left.  We will get her back on Monday and do a dose of IV iron.  We will recheck her labs and her INR.    ______________________________ Josph Macho, M.D. PRE/MEDQ  D:  03/11/2013  T:  03/12/2013  Job:  [id number]

## 2013-03-14 ENCOUNTER — Other Ambulatory Visit: Payer: Self-pay | Admitting: *Deleted

## 2013-03-14 ENCOUNTER — Other Ambulatory Visit (HOSPITAL_BASED_OUTPATIENT_CLINIC_OR_DEPARTMENT_OTHER): Payer: BC Managed Care – PPO | Admitting: Lab

## 2013-03-14 ENCOUNTER — Ambulatory Visit (HOSPITAL_BASED_OUTPATIENT_CLINIC_OR_DEPARTMENT_OTHER): Payer: BC Managed Care – PPO

## 2013-03-14 DIAGNOSIS — N920 Excessive and frequent menstruation with regular cycle: Secondary | ICD-10-CM

## 2013-03-14 DIAGNOSIS — D509 Iron deficiency anemia, unspecified: Secondary | ICD-10-CM

## 2013-03-14 DIAGNOSIS — D6859 Other primary thrombophilia: Secondary | ICD-10-CM

## 2013-03-14 DIAGNOSIS — Z86711 Personal history of pulmonary embolism: Secondary | ICD-10-CM

## 2013-03-14 DIAGNOSIS — Z7901 Long term (current) use of anticoagulants: Secondary | ICD-10-CM

## 2013-03-14 LAB — CBC WITH DIFFERENTIAL (CANCER CENTER ONLY)
BASO#: 0 10*3/uL (ref 0.0–0.2)
BASO%: 0.6 % (ref 0.0–2.0)
Eosinophils Absolute: 0.1 10*3/uL (ref 0.0–0.5)
HGB: 11.1 g/dL — ABNORMAL LOW (ref 11.6–15.9)
MCH: 29.6 pg (ref 26.0–34.0)
MCHC: 32.7 g/dL (ref 32.0–36.0)
MCV: 90 fL (ref 81–101)
MONO%: 5.4 % (ref 0.0–13.0)
NEUT#: 4.9 10*3/uL (ref 1.5–6.5)
RBC: 3.75 10*6/uL (ref 3.70–5.32)
RDW: 19.4 % — ABNORMAL HIGH (ref 11.1–15.7)

## 2013-03-14 LAB — PROTIME-INR (CHCC SATELLITE)
INR: 1.2 — ABNORMAL LOW (ref 2.0–3.5)
Protime: 14.4 Seconds — ABNORMAL HIGH (ref 10.6–13.4)

## 2013-03-14 MED ORDER — FERUMOXYTOL INJECTION 510 MG/17 ML
510.0000 mg | Freq: Once | INTRAVENOUS | Status: DC
  Filled 2013-03-14: qty 17

## 2013-03-14 MED ORDER — SODIUM CHLORIDE 0.9 % IV SOLN
Freq: Once | INTRAVENOUS | Status: AC
  Administered 2013-03-14: 10:00:00 via INTRAVENOUS

## 2013-03-14 MED ORDER — SODIUM CHLORIDE 0.9 % IV SOLN
1020.0000 mg | Freq: Once | INTRAVENOUS | Status: AC
  Administered 2013-03-14: 1020 mg via INTRAVENOUS
  Filled 2013-03-14: qty 34

## 2013-03-14 NOTE — Patient Instructions (Addendum)
Ferumoxytol injection What is this medicine? FERUMOXYTOL is an iron complex. Iron is used to make healthy red blood cells, which carry oxygen and nutrients throughout the body. This medicine is used to treat iron deficiency anemia in people with chronic kidney disease. This medicine may be used for other purposes; ask your health care provider or pharmacist if you have questions. What should I tell my health care provider before I take this medicine? They need to know if you have any of these conditions: -anemia not caused by low iron levels -high levels of iron in the blood -magnetic resonance imaging (MRI) test scheduled -an unusual or allergic reaction to iron, other medicines, foods, dyes, or preservatives -pregnant or trying to get pregnant -breast-feeding How should I use this medicine? This medicine is for infusion into a vein. It is given by a health care professional in a hospital or clinic setting. Talk to your pediatrician regarding the use of this medicine in children. Special care may be needed. Overdosage: If you think you've taken too much of this medicine contact a poison control center or emergency room at once. Overdosage: If you think you have taken too much of this medicine contact a poison control center or emergency room at once. NOTE: This medicine is only for you. Do not share this medicine with others. What if I miss a dose? It is important not to miss your dose. Call your doctor or health care professional if you are unable to keep an appointment. What may interact with this medicine? This medicine may interact with the following medications: -other iron products This list may not describe all possible interactions. Give your health care provider a list of all the medicines, herbs, non-prescription drugs, or dietary supplements you use. Also tell them if you smoke, drink alcohol, or use illegal drugs. Some items may interact with your medicine. What should I watch  for while using this medicine? Visit your doctor or healthcare professional regularly. Tell your doctor or healthcare professional if your symptoms do not start to get better or if they get worse. You may need blood work done while you are taking this medicine. You may need to follow a special diet. Talk to your doctor. Foods that contain iron include: whole grains/cereals, dried fruits, beans, or peas, leafy green vegetables, and organ meats (liver, kidney). What side effects may I notice from receiving this medicine? Side effects that you should report to your doctor or health care professional as soon as possible: -allergic reactions like skin rash, itching or hives, swelling of the face, lips, or tongue -breathing problems -changes in blood pressure -feeling faint or lightheaded, falls -fever or chills -flushing, sweating, or hot feelings -swelling of the ankles or feet Side effects that usually do not require medical attention (Report these to your doctor or health care professional if they continue or are bothersome.): -diarrhea -headache -nausea, vomiting -stomach pain This list may not describe all possible side effects. Call your doctor for medical advice about side effects. You may report side effects to FDA at 1-800-FDA-1088. Where should I keep my medicine? This drug is given in a hospital or clinic and will not be stored at home. NOTE: This sheet is a summary. It may not cover all possible information. If you have questions about this medicine, talk to your doctor, pharmacist, or health care provider.  2013, Elsevier/Gold Standard. (06/14/2008 9:48:25 PM)  

## 2013-03-16 ENCOUNTER — Encounter: Payer: Self-pay | Admitting: Pharmacist

## 2013-03-16 NOTE — Progress Notes (Signed)
Pt presented to [facility] on 03/11/13 w/ heavy vaginal bleeding.  She was seen by [name] & her INR was checked there.  INR = 4.6 She was given Vit K 0.5 mg SQ at that visit.  She was instructed by [name] to hold 2 doses of Coumadin.   I s/w Stevenson Clinch, RN over the phone today to get clarification on pts current Coumadin dose.  Pt took 3.75 mg x 1 dose on 6/8. Pts INR was rechecked 03/14/13 = 1.2.  She was instructed by [name] to increase her Coumadin dose back up to 7.5 mg daily. Pt will have her INR rechecked 03/18/13.  A Coumadin clinic pharmacist will manager her result at request of [name]. Ebony Hail, Pharm.D., CPP 03/16/2013@9 :09 AM

## 2013-03-18 ENCOUNTER — Other Ambulatory Visit (HOSPITAL_BASED_OUTPATIENT_CLINIC_OR_DEPARTMENT_OTHER): Payer: BC Managed Care – PPO | Admitting: Lab

## 2013-03-18 ENCOUNTER — Ambulatory Visit (HOSPITAL_BASED_OUTPATIENT_CLINIC_OR_DEPARTMENT_OTHER): Payer: BC Managed Care – PPO | Admitting: Pharmacist

## 2013-03-18 DIAGNOSIS — I2699 Other pulmonary embolism without acute cor pulmonale: Secondary | ICD-10-CM

## 2013-03-18 DIAGNOSIS — D6859 Other primary thrombophilia: Secondary | ICD-10-CM

## 2013-03-18 LAB — PROTIME-INR (CHCC SATELLITE)
INR: 1.7 — ABNORMAL LOW (ref 2.0–3.5)
Protime: 20.4 Seconds — ABNORMAL HIGH (ref 10.6–13.4)

## 2013-03-18 LAB — POCT INR: INR: 1.7

## 2013-03-18 NOTE — Progress Notes (Signed)
INR = 1.7 Pt is back on her usual dose (took 3.75 mg on 6/8 & 6/9) of 7.5 mg/day except 3.75 mg on Mon/Fri She is requesting to stop her Coumadin today in preparation for surgery at [facility] next week & just go on Arixtra injections.  I think this is a safer option for now since she has had "ups & downs" w/ her INR recently.  [name] is in agreement. Her vaginal bleeding has slowed significantly since she got vit K & held 2 days of Coumadin last weekend.  She is having vaginal spotting now as opposed to passing clots & soaking through towels. She has some bruising on her L arm that is healing. She was unable to find vit K 100 mcg tablets as prescribed.  I d/c'd that off her med list. Pt is requesting an INR home monitoring machine so I'll go ahead & start the application process today.  We discussed that she would have to be trained at home by someone from the company [employer]) how to check her INR's.  That would have to be coordinated after she is discharged from the hospital. Plan: Hold Coumadin. Begin Arixtra 7.5 mg injections today- inject 1 syringe subcutaneously every 24 hour (rotate sides of abdomen) as prescribed. Take your last Arixtra injection on Sunday 6/22. No anticoag 6/23. Surgery 6/24 at [facility]. After surgery, restart Coumadin 7.5mg  daily except 3.75mg  on Mon & Fri.  [facility] may restart LMWH; pt is aware). Return 04/07/13 for protime in [place] office. Ebony Hail, Pharm.D., CPP 03/18/2013@11 :03 AM

## 2013-03-18 NOTE — Patient Instructions (Addendum)
Hold Coumadin. Begin Arixtra 7.5 mg injections today- inject 1 syringe subcutaneously every 24 hour (rotate sides of abdomen) as prescribed. Take your last Arixtra injection on Sunday 6/22. No anticoag 6/23. Surgery 6/24 at [facility]. After surgery, restart Coumadin 7.5mg  daily except 3.75mg  on Mon & Fri. Return 04/07/13 for protime in [place] office.

## 2013-03-28 ENCOUNTER — Encounter: Payer: Self-pay | Admitting: Pharmacist

## 2013-03-28 NOTE — Progress Notes (Signed)
Received fax confirmation from [employer] [facility] ([facility] stating pt is set up for [employer] INR@Home  self-testing service. The pt is encouraged to self-test on a weekly basis. I called [place] & she had just spoken w/ a rep from [employer].  They are going to go ahead w/ the service & she will be trained on how to use the machine by a [facility] Healthcare Specialist. Pt is due for protime 04/07/13 post-op.  Soon after that we can begin her home testing routine. Ebony Hail, Pharm.D., CPP 03/28/2013@4 :13 PM

## 2013-04-01 ENCOUNTER — Telehealth: Payer: Self-pay | Admitting: Hematology & Oncology

## 2013-04-01 ENCOUNTER — Telehealth: Payer: Self-pay | Admitting: Pharmacist

## 2013-04-01 NOTE — Telephone Encounter (Signed)
Patient called and cx 04/04/13 lab and asked to speak with an RN

## 2013-04-04 ENCOUNTER — Other Ambulatory Visit: Payer: Self-pay | Admitting: Hematology & Oncology

## 2013-04-04 ENCOUNTER — Other Ambulatory Visit: Payer: BC Managed Care – PPO | Admitting: Lab

## 2013-04-04 NOTE — Telephone Encounter (Signed)
Rcd call from [facility] (surgery) physician asking pt to be scheduled for INR on 04/04/13 at [facility]. Pt scheduled for 10:15am at [facility]. MD informed pt of appointment date and time.

## 2013-04-06 ENCOUNTER — Ambulatory Visit (HOSPITAL_BASED_OUTPATIENT_CLINIC_OR_DEPARTMENT_OTHER): Payer: BC Managed Care – PPO | Admitting: Pharmacist

## 2013-04-06 DIAGNOSIS — I2699 Other pulmonary embolism without acute cor pulmonale: Secondary | ICD-10-CM

## 2013-04-06 NOTE — Progress Notes (Signed)
INR = 1.7 today; drawn at [facility] in [place], [place] (ph # [phone]) CBC wnl today also. I talked w/ Kimberly Monroe over the phone to discuss her lab results.  [place]  She returns home on 04/09/13. She is not currently bleeding.  She stated her surgery went well & she already had her stitches out w/ no complications. She stopped Arixtra after her dose Sunday 04/03/13 at her own discretion.   She is on Coumadin 7.5 mg daily except 3.75 mg on Mon/Fri.  She's not exactly sure when she restarted but her records from [facility] indicate that it was resumed on 03/30/13, so she has only been back on her Coumadin for 1 week. INR is subtherapeutic.  Given the fact she just had abdominal surgery & the notes from Dr. Duard Brady (at [facility]) suggest pt should remain on Arixtra until her INR is therapeutic for at least 24 hrs, I will have pt resume her Arixtra.  She stated she has 8 syringes remaining with her now. Continue Coumadin at present dose. [name] stated that the [employer] home monitoring trainer was coming to her home on 04/11/13 to deliver her INR home monitor & to do her training.  We will expect them to check the INR that day.  Pt will call if there are problems. NO CHARGE; Phone encounter. Ebony Hail, Pharm.D., CPP 04/06/2013@1 :31 PM

## 2013-04-07 ENCOUNTER — Ambulatory Visit: Payer: BC Managed Care – PPO

## 2013-04-07 ENCOUNTER — Other Ambulatory Visit: Payer: BC Managed Care – PPO

## 2013-04-11 ENCOUNTER — Ambulatory Visit (HOSPITAL_BASED_OUTPATIENT_CLINIC_OR_DEPARTMENT_OTHER): Payer: Self-pay | Admitting: Pharmacist

## 2013-04-11 DIAGNOSIS — I2699 Other pulmonary embolism without acute cor pulmonale: Secondary | ICD-10-CM

## 2013-04-11 LAB — POCT INR: INR: 3.7

## 2013-04-11 NOTE — Progress Notes (Signed)
INR = 3.7 On Arixtra since 04/06/13 INR = 1.7 On Coumadin 7.5 mg/day; 3.75 mg on Mon/Fri. I s/w pt over phone today.  She was trained on using her home monitor by [employer].  She will be checking her INR's weekly.  I think when she is stable, we can have her check it every 2 weeks. Pt reports 1 episode of minor vaginal bleeding.  Not currently bleeding. Her vit K intake has significantly decreased in past 5 days; her husband usually cooks spinach but he is out of town. She did not get any vit K foods (cabbage, broccoli, etc) in the hospital at [facility] since she had abdominal surgery & these tend to cause "gas" per pt. INR is elevated.   D/C Arixtra.  Change Coumadin this week to Coumadin 7.5 mg daily except 3.75 mg on MWF.   Go ahead & incorporate some vit K foods back into the diet but don't overdo it.  Try to eat usual amount. Recheck INR at home on 04/18/13.  We'll call pt once we receive the faxed result from [employer]. Ebony Hail, Pharm.D., CPP 04/11/2013@4 :04 PM

## 2013-04-13 ENCOUNTER — Telehealth: Payer: Self-pay | Admitting: Family Medicine

## 2013-04-13 ENCOUNTER — Ambulatory Visit (INDEPENDENT_AMBULATORY_CARE_PROVIDER_SITE_OTHER): Payer: BC Managed Care – PPO | Admitting: Family Medicine

## 2013-04-13 ENCOUNTER — Encounter: Payer: Self-pay | Admitting: Family Medicine

## 2013-04-13 VITALS — BP 130/68 | HR 64 | Temp 98.3°F | Ht 64.0 in | Wt 152.0 lb

## 2013-04-13 DIAGNOSIS — R197 Diarrhea, unspecified: Secondary | ICD-10-CM

## 2013-04-13 DIAGNOSIS — R509 Fever, unspecified: Secondary | ICD-10-CM

## 2013-04-13 MED ORDER — METRONIDAZOLE 500 MG PO TABS: 500.0000 mg | ORAL_TABLET | Freq: Three times a day (TID) | ORAL | Status: DC

## 2013-04-13 NOTE — Progress Notes (Signed)
  Subjective:    Patient ID: Kimberly Monroe, female    DOB: 10-27-1960, 52 y.o.   MRN: [id number]  HPI Patient seen with chief complaint of some generalized weakness, low-grade fever and diarrhea for the past few days. 2 weeks ago she had TAH BSO in [place] secondary to menorrhagia for apparently fibroids and right ovarian mass. No reported complications from surgery. She states she has had some diarrhea since then.  She was not discharged on any oral antibiotics. She denies any abdominal pain. No nausea or vomiting. Stools are watery and nonbloody. She's had low-grade fever around 100.1 for the past 4 days. Occasional mild cramping abdomen but no significant pains No history of C. difficile colitis  She takes Coumadin for history of pulmonary embolism. INR earlier today 3.7.  Past Medical History  Diagnosis Date  . Depression   . History of blood clotting disorder   . Anemia   . Anxiety   . Allergy     seasonal  . Pulmonary embolism   . Melanoma 2009    insitu  . Basal cell cancer   . Squamous cell carcinoma   . Ovarian cancer 1995    left ovary  . Migraine     history of/none in years  . Menorrhagia 02/10/2013   Past Surgical History  Procedure Laterality Date  . Cesarean section  1996, 2000    twins, singleton  . Breast ductal system excision  2009    L intraductal papilloma  . Laparoscopy with ovarian cystectomy  1994    ovarian torsion  . 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 Mohs surgery  2009    Dr Park Liter    reports that she has never smoked. She has never used smokeless tobacco. She reports that  drinks alcohol. She reports that she does not use illicit drugs. family history includes Breast cancer (age of onset: 54) in her mother; Cancer in her paternal uncle; Cancer (age of onset: 55) in her father; and Colon cancer in her father. Allergies  Allergen Reactions  . Penicillins Other (See Comments)    Possibility-patient has never taken it      Review  of Systems  Constitutional: Positive for fatigue. Negative for fever and chills.  Respiratory: Negative for cough.   Cardiovascular: Negative for chest pain.  Gastrointestinal: Positive for diarrhea. Negative for nausea, vomiting, abdominal pain, blood in stool and abdominal distention.  Neurological: Positive for weakness.       Objective:   Physical Exam  Constitutional: She appears well-developed and well-nourished.  HENT:  Mouth/Throat: Oropharynx is clear and moist.  Cardiovascular: Normal rate and regular rhythm.   Pulmonary/Chest: Effort normal and breath sounds normal. No respiratory distress. She has no wheezes. She has no rales.  Abdominal: Soft. She exhibits no mass. There is no tenderness. There is no rebound and no guarding.  Vertical incision lower abdomen intact. Steri-Strips intact. No signs of erythema. Abdomen nontender. No distention.  Musculoskeletal: She exhibits no edema.          Assessment & Plan:  Patient presents with diarrhea and low-grade fever for the past several days with recent abdominal surgery as above. Rule out C. difficile colitis. C. difficile studies ordered. Check CBC with differential. Start metronidazole 500 mg 3 times a day pending stool study. She has followup with surgeon tomorrow.she is aware antibiotics can affect INR and this will be followed closely.

## 2013-04-13 NOTE — Telephone Encounter (Signed)
Pt does have appointment today at 4:00pm

## 2013-04-13 NOTE — Telephone Encounter (Signed)
Patient Information:  Caller Name: [name]  Phone: [phone]  Patient: Kimberly Monroe, Kimberly Monroe  Gender: Female  DOB: Sep 14, 1961  Age: 52 Years  PCP: Evelena Peat [facility])  Pregnant: No  Office Follow Up:  Does the office need to follow up with this patient?: No  Instructions For The Office: N/A  RN Note:  Reports 4-5 watery stools daily. No vomiting. Drinking fluids well.  Last void at 0845.  Previous void > 12 hours.  Mouth feels dry.   Ambulatory but feels weak. Had [facility] 03/29/13. No oral antibiotics prescribed. Abdominal incision intact and without drainage; incision covered with steri-strips.  Symptoms  Reason For Call & Symptoms: Diarrhea with temp elevation. Temp was 100.1 tympanic at 2100.  Afebrile now; 99 tympanic at 0915.  Took Motrin at 0915.  Had TAHBSO in [place] 03/29/13 for right ovarian mass, menometrorrhagia, severe anemia, and fibroids.  Surgeon office told her to follow up with PCP due not driving yet and travel distance.  Surgeon suggested C-Diff culture.  Reviewed Health History In EMR: Yes  Reviewed Medications In EMR: Yes  Reviewed Allergies In EMR: Yes  Reviewed Surgeries / Procedures: Yes  Date of Onset of Symptoms: 04/09/2013  Treatments Tried: bland diet  Treatments Tried Worked: No OB / GYN:  LMP: Unknown  Guideline(s) Used:  Diarrhea  Disposition Per Guideline:   See Today in Office  Reason For Disposition Reached:   Mucus or pus in stool has been present > 2 days and diarrhea is more than mild  Advice Given:  Fluids:  Drink more fluids, at least 8-10 glasses (8 oz or 240 ml) daily.  Supplement this with saltine crackers or soups to make certain that you are getting sufficient fluid and salt to meet your body's needs.  Nutrition:  Maintaining some food intake during episodes of diarrhea is important.  Ideal initial foods include boiled starches/cereals (e.g., potatoes, rice, noodles, wheat, oats) with a small amount of salt to  taste.  Other acceptable foods include: bananas, yogurt, crackers, soup.  Expected Course:  Viral diarrhea lasts 4-7 days. Always worse on days 1 and 2.  Call Back If:  Signs of dehydration occur (e.g., no urine for more than 12 hours, very dry mouth, lightheaded, etc.)  Diarrhea lasts over 7 days  You become worse.  Patient Will Follow Care Advice:  YES  Appointment Scheduled:  04/13/2013 16:00:00 Appointment Scheduled Provider:  Evelena Peat [facility])

## 2013-04-14 LAB — CBC WITH DIFFERENTIAL/PLATELET
Basophils Relative: 0.8 % (ref 0.0–3.0)
Eosinophils Relative: 2.9 % (ref 0.0–5.0)
HCT: 40.6 % (ref 36.0–46.0)
Hemoglobin: 13.5 g/dL (ref 12.0–15.0)
Lymphs Abs: 2.1 10*3/uL (ref 0.7–4.0)
MCV: 93.3 fl (ref 78.0–100.0)
Monocytes Absolute: 0.5 10*3/uL (ref 0.1–1.0)
Neutro Abs: 5 10*3/uL (ref 1.4–7.7)
Neutrophils Relative %: 62.9 % (ref 43.0–77.0)
Platelets: 386 10*3/uL (ref 150.0–400.0)
RBC: 4.35 Mil/uL (ref 3.87–5.11)
WBC: 7.9 10*3/uL (ref 4.5–10.5)

## 2013-04-15 ENCOUNTER — Encounter: Payer: Self-pay | Admitting: Hematology & Oncology

## 2013-04-18 ENCOUNTER — Ambulatory Visit (HOSPITAL_BASED_OUTPATIENT_CLINIC_OR_DEPARTMENT_OTHER): Payer: Self-pay | Admitting: Pharmacist

## 2013-04-18 DIAGNOSIS — I2699 Other pulmonary embolism without acute cor pulmonale: Secondary | ICD-10-CM

## 2013-04-18 LAB — POCT INR: INR: 5.3

## 2013-04-18 NOTE — Progress Notes (Signed)
INR above goal today. INR increased quickly since 04/11/13. Pt has been taking 7.5mg  daily except 3.75mg  on MWF since 04/11/13. No bruising. No nosebleeds. No blood in urine or stool. No problems related to anticoagulation to report. Pt is post abdominal surgery on 6/24 @ [facility]. Pt did NOT start the Flagyl as prescribed on 04/13/13. Her diarrhea symptoms started to resolve by the time the Rx was called to the pharmacy. Her only current medications are the clonazepam and the warfarin. Pt did have spinach last night.  In general, pt is eating less food. She is very busy with life and her children. She recently started driving again post surgery. Will hold coumadin today (7/14) and tomorrow (7/15). Recheck INR with home monitor on 7/16. Discussed having pt come to one of the clinics to have her INR drawn to help correlate with her home INR machine since INR was > 4. Pt wished to check her INR with home monitor first on 7/16 (first thing in the morning) and if the results are concerning or inconsistent, she will consider coming to [facility] or [facility] for repeat results.  Spoke with pt by phone regarding INR results on home monitor.

## 2013-04-18 NOTE — Patient Instructions (Addendum)
Hold coumadin today (7/14) and tomorrow (7/15). Recheck INR with home monitor on 7/16. Continue to monitor for signs/symptoms of bleeding. Call us if you have any questions or concerns, [phone] or [phone]

## 2013-04-20 ENCOUNTER — Ambulatory Visit: Payer: Self-pay | Admitting: Pharmacist

## 2013-04-20 DIAGNOSIS — I2699 Other pulmonary embolism without acute cor pulmonale: Secondary | ICD-10-CM

## 2013-04-20 NOTE — Progress Notes (Signed)
This is a telephone encounter-no charge  INR=2 after holding coumadin x 2 days with an INR on 7/14=5.3.  Kimberly Monroe stated that she checked her INR last night b/c she "didn't feel right" and it was 2.6.  We discussed that the home monitor is not very accurate when INR>4, so her INR may not have been up to 5.3 two days ago.  I am unsure why her INR is so elevated on coumadin doses that are similar to what she was taking prior to her surgery.  Will resume coumadin at 3.75mg  daily and 7.5mg  Mon/Fri.  She will check her PT/INR on Monday on her home monitor.  Kimberly  Monroe also has an appt with [name] tomorrow.

## 2013-04-21 ENCOUNTER — Ambulatory Visit (HOSPITAL_BASED_OUTPATIENT_CLINIC_OR_DEPARTMENT_OTHER): Payer: BC Managed Care – PPO | Admitting: Hematology & Oncology

## 2013-04-21 ENCOUNTER — Other Ambulatory Visit (HOSPITAL_BASED_OUTPATIENT_CLINIC_OR_DEPARTMENT_OTHER): Payer: BC Managed Care – PPO | Admitting: Lab

## 2013-04-21 VITALS — BP 116/75 | HR 84 | Temp 98.2°F | Resp 16 | Ht 64.0 in | Wt 198.0 lb

## 2013-04-21 DIAGNOSIS — I2699 Other pulmonary embolism without acute cor pulmonale: Secondary | ICD-10-CM

## 2013-04-21 DIAGNOSIS — D509 Iron deficiency anemia, unspecified: Secondary | ICD-10-CM

## 2013-04-21 LAB — CBC WITH DIFFERENTIAL (CANCER CENTER ONLY)
BASO%: 0.5 % (ref 0.0–2.0)
Eosinophils Absolute: 0.2 10*3/uL (ref 0.0–0.5)
LYMPH%: 24.3 % (ref 14.0–48.0)
MCH: 30.7 pg (ref 26.0–34.0)
MCHC: 33.2 g/dL (ref 32.0–36.0)
MCV: 93 fL (ref 81–101)
MONO#: 0.5 10*3/uL (ref 0.1–0.9)
MONO%: 7.3 % (ref 0.0–13.0)
NEUT#: 4.7 10*3/uL (ref 1.5–6.5)
NEUT%: 64.9 % (ref 39.6–80.0)
Platelets: 344 10*3/uL (ref 145–400)
RBC: 4.75 10*6/uL (ref 3.70–5.32)
RDW: 15.8 % — ABNORMAL HIGH (ref 11.1–15.7)
WBC: 7.3 10*3/uL (ref 3.9–10.0)

## 2013-04-21 LAB — IRON AND TIBC CHCC
%SAT: 30 % (ref 21–57)
Iron: 83 ug/dL (ref 41–142)

## 2013-04-21 LAB — PROTIME-INR (CHCC SATELLITE): Protime: 22.8 Seconds — ABNORMAL HIGH (ref 10.6–13.4)

## 2013-04-21 NOTE — Progress Notes (Signed)
This office note has been dictated.

## 2013-04-22 NOTE — Progress Notes (Signed)
DIAGNOSIS: 1. Recurrent thromboembolic disease. 2. Antiphospholipid antibody syndrome. 3. Patient is status post TAH/BSO for cystadenoma. 4. Recurrent iron deficiency anemia secondary to menometrorrhagia.  CURRENT THERAPY: 1. Coumadin to maintain INR between 2-3. 2. IV iron as indicated.  INTERIM HISTORY:  Kimberly Monroe comes in for followup.  She was at [facility].  She underwent a total hysterectomy with ovary removal.  This was done on 06/24.  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  There was no evidence of malignancy. She had adenomyosis of the uterus.  There were some leiomyomata in the uterus.  Again, there was no evidence of malignancy.  She is doing well.  We __________ before and after surgery without any difficulties.  She is having no problems with bleeding.  She has had no cough or shortness of breath.  Of note, we last gave her IV iron back on June 9th.  PHYSICAL EXAM:  General:  This is a well-developed, well-nourished white female, no obvious distress.  Vital signs:  Temperature of 98.2, pulse 84, respiratory rate 16, blood pressure 116/75.  Weight is 198.  Head and Neck:  Normocephalic, atraumatic skull.  There are no ocular or oral lesions.  There are no palpable cervical or supraclavicular lymph nodes. Lungs:  Clear bilaterally.  Cardiac:  Regular rate and rhythm with a normal S1, S2.  There are no murmurs, rubs or bruits.  Abdomen:  Soft with good bowel sounds.  There is no palpable abdominal mass.  She has a healing laparotomy scar.  She has no fluid wave.  There is no palpable hepatosplenomegaly.  Extremities:  No clubbing, cyanosis or edema.  No venous cord noted in the legs.  Skin:  No rashes, ecchymosis, or petechia.  LABORATORY STUDIES:  White cell count is 7.3, hemoglobin 14.6, hematocrit 44, platelet count 344.  INR is 1.9.  IMPRESSION:  Kimberly Monroe is a very nice 52 year old white female  with antiphospholipid antibody syndrome.  She is on lifelong Coumadin.  We are keeping INR probably close to 3 to 3.5.  She has her own Coumadin monitor at home.  Our Coumadin clinic is monitoring her Coumadin levels and adjusting her Coumadin doses accordingly.  Now that she has had her hysterectomy, one would think that iron deficiency should not be a problem for her.  Apparently, she goes back to [facility] on August 14th for followup.  I will plan to see her back in about a month or so, and we will see how she is doing.  I am so glad that she does not have any obvious malignancy.    ______________________________ Josph Macho, M.D. PRE/MEDQ  D:  04/21/2013  T:  04/22/2013  Job:  [id number]

## 2013-04-25 ENCOUNTER — Ambulatory Visit (HOSPITAL_BASED_OUTPATIENT_CLINIC_OR_DEPARTMENT_OTHER): Payer: Self-pay | Admitting: Pharmacist

## 2013-04-25 DIAGNOSIS — I2699 Other pulmonary embolism without acute cor pulmonale: Secondary | ICD-10-CM

## 2013-04-25 LAB — POCT INR: INR: 2.8

## 2013-04-25 NOTE — Progress Notes (Signed)
INR = 2.8 (drawn by pt at home w/ [employer] INR @Home  [employer] [employer]) Pt is now on a lower dose than pre-op: 3.75 mg daily except 7.5 mg on Mon & Fri. Pt requesting clarification on planned duration of anticoag: 2 years total vs. lifelong.  MD office visit note from 04/21/13 states she has antiphospholipid antibody syndrome & is on lifelong anticoag.  However, initial plan was 2 years of anticog. Pt also requesting MD advice on needing injections when she is traveling long distances beyond the 2 years of her anticoag.- is she still at risk for clotting if the anticoag does stop at 2 years? I will relay the message to [name] & once clarification is provided, I will call pt back. INR today is therapeutic.  Continue same dose. Recheck INR at home next Monday AM.  Phone encounter- NO CHARGE.  Ebony Hail, Pharm.D., CPP 04/25/2013@9 :28 AM

## 2013-04-26 ENCOUNTER — Telehealth: Payer: Self-pay | Admitting: Hematology & Oncology

## 2013-04-26 NOTE — Telephone Encounter (Addendum)
Message copied by Cathi Roan on Tue Apr 26, 2013  3:56 PM ------      Message from: Arlan Organ R      Created: Mon Apr 25, 2013  5:14 PM       Please call and let her know that her iron studies are normal. Thanks. Cindee Lame  04-26-13   Called patient at home and left a message regarding above MD note, advised if any questions to please call office.  Lupita Raider LPN ------

## 2013-05-02 ENCOUNTER — Ambulatory Visit (HOSPITAL_BASED_OUTPATIENT_CLINIC_OR_DEPARTMENT_OTHER): Payer: BC Managed Care – PPO | Admitting: Pharmacist

## 2013-05-02 DIAGNOSIS — I2699 Other pulmonary embolism without acute cor pulmonale: Secondary | ICD-10-CM

## 2013-05-02 LAB — POCT INR: INR: 2.8

## 2013-05-02 NOTE — Progress Notes (Signed)
INR at goal today. No complaints per patient. She has been traveling up in [place] and was coming back to [place] today. Pt is doing well with no missed doses or extra doses. She reports some bruising on her hip that is about the size of "a fist" but it is not getting any worse and could be from her previous arixtra injections as well as from her rubbing on her hip and thigh to try to remove medical markings.  Continue coumadin at 7.5mg M/F and 3.75mg other days. Recheck INR with home monitor on 8/4.  Phone Encounter: NO CHARGE 

## 2013-05-02 NOTE — Patient Instructions (Addendum)
INR at goal today. No complaints per patient. She has been traveling up in [place] and was coming back to [place] today. Pt is doing well with no missed doses or extra doses. She reports some bruising on her hip that is about the size of "a fist" but it is not getting any worse and could be from her previous arixtra injections as well as from her rubbing on her hip and thigh to try to remove medical markings.  Continue coumadin at 7.5mg  M/F and 3.75mg  other days. Recheck INR with home monitor on 8/4.  Phone Encounter: NO CHARGE

## 2013-05-09 ENCOUNTER — Ambulatory Visit: Payer: Self-pay | Admitting: Pharmacist

## 2013-05-09 DIAGNOSIS — I2699 Other pulmonary embolism without acute cor pulmonale: Secondary | ICD-10-CM

## 2013-05-09 LAB — POCT INR: INR: 2.7

## 2013-05-09 NOTE — Progress Notes (Signed)
spoke with patient over phone (NO CHARGE) INR (home monitor)= 2.7 on 7.5mg  on Mon and 3.75mg  other days. No changes to report. Recheck INR with home monitor on 8/11

## 2013-05-09 NOTE — Patient Instructions (Addendum)
Continue coumadin at 7.5mg  Mon and 3.75mg  other days. Recheck INR with home monitor on 8/11

## 2013-05-16 ENCOUNTER — Ambulatory Visit (HOSPITAL_BASED_OUTPATIENT_CLINIC_OR_DEPARTMENT_OTHER): Payer: Self-pay | Admitting: Pharmacist

## 2013-05-16 DIAGNOSIS — I2699 Other pulmonary embolism without acute cor pulmonale: Secondary | ICD-10-CM

## 2013-05-16 LAB — POCT INR: INR: 1.6

## 2013-05-16 NOTE — Progress Notes (Signed)
INR = 1.6 on 3.75 mg daily; 7.5 mg Mondays & Fridays. Pt has eaten more salads this week & spinach. No SOB/CP.  She had a cramp in her leg last night after dinner but none since. No missed doses of Coumadin & no med changes. Pt may be on prolonged trip to [place] by the end of this week. INR below goal.  Will have her take 7.5 mg today (usual) & also tomorrow then back to 3.75 mg daily; 7.5 mg Mon/Fri. Dr Myna Hidalgo does not feel it is necessary for pt to be on Arixtra currently. Repeat INR next Monday. This was a phone encounter- NO CHARGE. Ebony Hail, Pharm.D., CPP 05/16/2013@10 :11 AM

## 2013-05-20 ENCOUNTER — Ambulatory Visit (HOSPITAL_BASED_OUTPATIENT_CLINIC_OR_DEPARTMENT_OTHER): Payer: BC Managed Care – PPO | Admitting: Hematology & Oncology

## 2013-05-20 ENCOUNTER — Other Ambulatory Visit (HOSPITAL_BASED_OUTPATIENT_CLINIC_OR_DEPARTMENT_OTHER): Payer: BC Managed Care – PPO | Admitting: Lab

## 2013-05-20 VITALS — BP 145/89 | HR 64 | Temp 98.0°F | Resp 20 | Wt 201.0 lb

## 2013-05-20 DIAGNOSIS — I2699 Other pulmonary embolism without acute cor pulmonale: Secondary | ICD-10-CM

## 2013-05-20 DIAGNOSIS — D509 Iron deficiency anemia, unspecified: Secondary | ICD-10-CM

## 2013-05-20 DIAGNOSIS — Z7901 Long term (current) use of anticoagulants: Secondary | ICD-10-CM

## 2013-05-20 LAB — CBC WITH DIFFERENTIAL (CANCER CENTER ONLY)
BASO%: 0.7 % (ref 0.0–2.0)
EOS%: 2.6 % (ref 0.0–7.0)
HCT: 43.6 % (ref 34.8–46.6)
LYMPH#: 1.9 10*3/uL (ref 0.9–3.3)
MCH: 31 pg (ref 26.0–34.0)
MCHC: 33.3 g/dL (ref 32.0–36.0)
MONO#: 0.4 10*3/uL (ref 0.1–0.9)
NEUT#: 3.3 10*3/uL (ref 1.5–6.5)
Platelets: 298 10*3/uL (ref 145–400)
RDW: 14.4 % (ref 11.1–15.7)
WBC: 5.7 10*3/uL (ref 3.9–10.0)

## 2013-05-20 LAB — IRON AND TIBC CHCC
%SAT: 24 % (ref 21–57)
Iron: 70 ug/dL (ref 41–142)
UIBC: 226 ug/dL (ref 120–384)

## 2013-05-20 LAB — PROTIME-INR (CHCC SATELLITE)
INR: 2.2 (ref 2.0–3.5)
Protime: 26.4 Seconds — ABNORMAL HIGH (ref 10.6–13.4)

## 2013-05-20 NOTE — Progress Notes (Signed)
This office note has been dictated.

## 2013-05-21 NOTE — Progress Notes (Signed)
DIAGNOSES: 1. Pulmonary embolism, May 2013. 2. Iron-deficiency anemia. 3. Transiently positive anticardiolipin antibody.  CURRENT THERAPY: 1. Coumadin, 2 years' duration, to maintain INR between 2-3. 2. IV iron as indicated.  INTERIM HISTORY:  Kimberly Monroe comes in for followup.  She is doing fairly well.  She has recovered nicely from her laparotomy at [facility]. Thankfully, she was not found to have any malignancy.  She had a mucinous cystadenoma of the right ovary.  Her problem that she is having now is insurance issues.  She is being told to pay, I think $2000 for lab work.  I am not sure why her insurance company would make her pay for this.  Everything that was done was pretty much standard.  We repeated her anticardiolipin antibody studies in April.  These had normalized.  Again, she has not had any issues with the Coumadin.  She has had no bleeding with this.  She had did have some have problems with iron deficiency, which we treated with IV iron.  Otherwise, she seems to be doing okay.  There is no cough.  She has had no leg swelling.  She has had no rashes.  There has been no headache.  PHYSICAL EXAMINATION:  General:  This is a well-developed, well- nourished white female in no obvious distress.  Vital signs: Temperature of 98, pulse 64, respiratory rate 20, blood pressure 145/89. Weight is 201 pounds.  Head and neck:  Normocephalic, atraumatic skull. There are no ocular or oral lesions.  There are no palpable cervical or supraclavicular lymph nodes.  Lungs:  Clear bilaterally.  Cardiac: Regular rate and rhythm with a normal S1 and S2.  There are no murmurs, rubs or bruits.  Abdomen:  Soft.  She has good bowel sounds.  There is no fluid wave.  There is no palpable hepatosplenomegaly.  She has a well- healed healed laparotomy scar.  Back:  No tenderness over the spine, ribs or hips.  Extremities:  No clubbing, cyanosis or edema.  No venous cord is noted in the legs.   Neurological:  No focal neurological deficits.  LABORATORY STUDIES:  White cell count 5.7, hemoglobin 14.5, hematocrit 43.6, platelet count 298.  Her INR is 2.2.  IMPRESSION:  Kimberly Monroe is a very charming 52 year old white female with a pulmonary embolism.  She had this in May 2013.  She had transiently positive anticardiolipin antibody.  The last time we checked this, it had normalized.  I still feel that 2 years of anticoagulation would be appropriate for her.  We are checking her INR every week or so.  With her hysterectomy, I will like to think that iron deficiency should not be much of a problem now.  I will plan to get her back to see me in another couple of months or so.    ______________________________ Kimberly Monroe, M.D. PRE/MEDQ  D:  05/20/2013  T:  05/21/2013  Job:  [id number]

## 2013-05-23 ENCOUNTER — Ambulatory Visit (HOSPITAL_BASED_OUTPATIENT_CLINIC_OR_DEPARTMENT_OTHER): Payer: Self-pay | Admitting: Pharmacist

## 2013-05-23 ENCOUNTER — Telehealth: Payer: Self-pay | Admitting: Hematology & Oncology

## 2013-05-23 DIAGNOSIS — I2699 Other pulmonary embolism without acute cor pulmonale: Secondary | ICD-10-CM

## 2013-05-23 NOTE — Telephone Encounter (Signed)
Pt aware of 11-14 MD appointment. Per [facility] INR is being monitored thru them.

## 2013-05-23 NOTE — Patient Instructions (Signed)
Take 7.5 mg tomorrow (8/19) then continue coumadin at 7.5mg  Mon/Fri and 3.75mg  other days.  Recheck INR with home monitor on 8/25.

## 2013-05-23 NOTE — Progress Notes (Signed)
INR slightly below goal today. INR results from 8/15 = 2.2. CBC from 05/20/13 = wnl. No problems to report regarding anticoagulation. No s/s of clotting. Pt doing well. Pt continues eating more green vegetables, salads and spinach (unchanged from last week). No missed doses. Pt took coumadin as instructed on 05/16/13. INR therapeutic on 05/20/13 after taking 7.5mg  (vs 3.75mg ) on 05/17/13. No other changes to report. Coumadin 7.5 mg tomorrow (8/19) then continue coumadin at 7.5mg  Mon/Fri and 3.75mg  other days.  Recheck INR with home monitor on 8/25. Will evaluate INR on 8/25 to see if we need to continue 3.75mg  daily except 7.5mg  on MWF. Spoke with patient by phone. No charge encounter.

## 2013-05-24 ENCOUNTER — Encounter: Payer: Self-pay | Admitting: *Deleted

## 2013-05-30 ENCOUNTER — Ambulatory Visit (HOSPITAL_BASED_OUTPATIENT_CLINIC_OR_DEPARTMENT_OTHER): Payer: BC Managed Care – PPO | Admitting: Pharmacist

## 2013-05-30 DIAGNOSIS — I2699 Other pulmonary embolism without acute cor pulmonale: Secondary | ICD-10-CM

## 2013-05-30 NOTE — Progress Notes (Signed)
INR within goal today. No problems to report regarding anticoagulation. No missed coumadin doses. Pt did have a minor cut on her leg from shaving which stopped bleeding quickly after she got out of the shower. No unusual bruising. No changes in medications. Pt and her husband plan to eat more spinach this week (to be more healthy). Continue coumadin 3.75mg  daily except 7.5mg  on Mon &Fri. Recheck INR with home monitor on 9/2. Pt knows that 1 week from today is labor day and our office is closed. Spoke to pt by telephone. No charge encounter.

## 2013-05-30 NOTE — Patient Instructions (Signed)
Continue coumadin 3.75mg  daily except 7.5mg  on Mon &Fri. Recheck INR with home monitor on 9/2.

## 2013-06-07 ENCOUNTER — Ambulatory Visit: Payer: BC Managed Care – PPO | Admitting: Pharmacist

## 2013-06-07 DIAGNOSIS — I2699 Other pulmonary embolism without acute cor pulmonale: Secondary | ICD-10-CM

## 2013-06-07 NOTE — Progress Notes (Signed)
INR = 2.2 Pt remains on Coumadin 3.75 mg/day; 7.5 mg Mon/Fri. No complaints today.  No SOB/CP.  No leg cramps/pain. INR at goal.  No change to dose of Coumadin necessary. Recheck INR by home monitor next week. Phone encounter- NO CHARGE. Ebony Hail, Pharm.D., CPP 06/07/2013@8 :24 AM

## 2013-06-13 ENCOUNTER — Ambulatory Visit (HOSPITAL_BASED_OUTPATIENT_CLINIC_OR_DEPARTMENT_OTHER): Payer: BC Managed Care – PPO | Admitting: Pharmacist

## 2013-06-13 DIAGNOSIS — I2699 Other pulmonary embolism without acute cor pulmonale: Secondary | ICD-10-CM

## 2013-06-13 LAB — POCT INR: INR: 2.7

## 2013-06-13 NOTE — Progress Notes (Signed)
INR = 2.7 Pt remains on Coumadin 3.75 mg/day; 7.5 mg Mon/Fri.  No complaints today. No bleeding but dose have a bruise on her knuckle/hand (this is not bothering her and has begun to improve slightly this week) and no missed/extra doses this week INR at goal. No change to dose of Coumadin necessary.  Recheck INR by home monitor next week.  Phone encounter- NO CHARGE.

## 2013-06-18 ENCOUNTER — Other Ambulatory Visit: Payer: Self-pay | Admitting: Hematology & Oncology

## 2013-06-20 ENCOUNTER — Ambulatory Visit (HOSPITAL_BASED_OUTPATIENT_CLINIC_OR_DEPARTMENT_OTHER): Payer: BC Managed Care – PPO | Admitting: Pharmacist

## 2013-06-20 DIAGNOSIS — I2699 Other pulmonary embolism without acute cor pulmonale: Secondary | ICD-10-CM

## 2013-06-20 LAB — POCT INR
INR: 2.5
INR: 2.5

## 2013-06-20 NOTE — Progress Notes (Signed)
INR via home monitor. INR continues to be within goal today. No problems to report regarding anticoagulation. No changes in diet, medications, etc. No missed doses of coumadin. Will continue coumadin 3.75mg  daily except 7.5mg  on Mon &Fri. Recheck INR with home monitor on 06/27/13. Spoke with pt by telephone - No charge encounter.

## 2013-06-20 NOTE — Patient Instructions (Addendum)
Continue coumadin 3.75mg  daily except 7.5mg  on Mon &Fri. Recheck INR with home monitor on 06/27/13.

## 2013-06-27 ENCOUNTER — Ambulatory Visit: Payer: BC Managed Care – PPO | Admitting: Pharmacist

## 2013-06-27 DIAGNOSIS — I2699 Other pulmonary embolism without acute cor pulmonale: Secondary | ICD-10-CM

## 2013-06-27 LAB — POCT INR: INR: 2.6

## 2013-06-27 NOTE — Patient Instructions (Addendum)
INR at goal No changes Will continue coumadin 3.75mg  daily except 7.5mg  on Mon &Fri.  Recheck next week

## 2013-06-27 NOTE — Progress Notes (Signed)
INR via home monitor.  INR continues to be within goal today (INR = 2.6).  No problems to report regarding anticoagulation.  No changes in diet, medications, etc.  No missed doses of coumadin.  Will continue coumadin 3.75mg  daily except 7.5mg  on Mon &Fri.  Recheck INR with home monitor on 07/11/13.  Spoke with pt by telephone - No charge encounter.

## 2013-07-04 ENCOUNTER — Ambulatory Visit: Payer: BC Managed Care – PPO | Admitting: Pharmacist

## 2013-07-04 DIAGNOSIS — I2699 Other pulmonary embolism without acute cor pulmonale: Secondary | ICD-10-CM

## 2013-07-04 LAB — POCT INR: INR: 1.5

## 2013-07-04 NOTE — Progress Notes (Signed)
Telephone encounter--**No charge**  INR below goal.  Kimberly Monroe thinks that she may have missed a coumadin dose this week.  INR has been very stable on 7.5mg  M/F and 3.75mg  other days.  Instructed Kimberly Monroe to take 7.5mg  today and tomorrow (instead of 3.75mg  tomorrow) and then continue current coumadin dose.  Kimberly Monroe will check PT/INR at home next Monday.

## 2013-07-11 ENCOUNTER — Ambulatory Visit (HOSPITAL_BASED_OUTPATIENT_CLINIC_OR_DEPARTMENT_OTHER): Payer: Self-pay | Admitting: Pharmacist

## 2013-07-11 DIAGNOSIS — I2699 Other pulmonary embolism without acute cor pulmonale: Secondary | ICD-10-CM

## 2013-07-11 LAB — POCT INR: INR: 2.8

## 2013-07-11 NOTE — Progress Notes (Signed)
INR = 2.8 (home monitor) Pt took booster doses of 7.5 mg x 2 days last week as instructed due to missed dose & low INR. No recent med changes. INR at goal.  Continue on usual dose of 3.75 mg daily except 7.5 mg on Mon/Fri. Repeat INR at home in 1 week.   NO CHARGE- Phone Encounter. Ebony Hail, Pharm.D., CPP 07/11/2013@11 :36 AM

## 2013-07-18 ENCOUNTER — Ambulatory Visit: Payer: Self-pay | Admitting: Pharmacist

## 2013-07-18 DIAGNOSIS — I2699 Other pulmonary embolism without acute cor pulmonale: Secondary | ICD-10-CM

## 2013-07-18 LAB — POCT INR: INR: 2.1

## 2013-07-18 NOTE — Patient Instructions (Signed)
Continue coumadin 3.75mg  daily except 7.5mg  on Mon &Fri. Recheck INR with home monitor on 07/25/13.

## 2013-07-18 NOTE — Progress Notes (Signed)
**  No charge - Phone Encounter** INR at goal No changes Pt reports no complications re: anticoagulation Pt returning from trip with her children Continue coumadin 3.75mg  daily except 7.5mg  on Mon &Fri. Recheck INR with home monitor on 07/25/13.

## 2013-07-25 ENCOUNTER — Ambulatory Visit: Payer: BC Managed Care – PPO | Admitting: Pharmacist

## 2013-07-25 DIAGNOSIS — I2699 Other pulmonary embolism without acute cor pulmonale: Secondary | ICD-10-CM

## 2013-07-25 LAB — POCT INR: INR: 2.1

## 2013-07-25 NOTE — Patient Instructions (Signed)
INR at goal No changes Continue coumadin 3.75mg  daily except 7.5mg  on Mon &Fri. Recheck INR with home monitor on 08/01/13.

## 2013-07-25 NOTE — Progress Notes (Signed)
**  No charge - Phone Encounter**  INR at goal again at 2.1 No changes  Pt reports no complications re: anticoagulation  Continue coumadin 3.75mg  daily except 7.5mg  on Mon &Fri.  Recheck INR with home monitor on 08/01/13 in 1 week.

## 2013-08-01 ENCOUNTER — Ambulatory Visit (INDEPENDENT_AMBULATORY_CARE_PROVIDER_SITE_OTHER): Payer: Self-pay | Admitting: Pharmacist

## 2013-08-01 DIAGNOSIS — I2699 Other pulmonary embolism without acute cor pulmonale: Secondary | ICD-10-CM

## 2013-08-01 LAB — POCT INR: INR: 2.2

## 2013-08-01 NOTE — Progress Notes (Signed)
Telephone encounter-no charge  INR continues to be at goal.  No changes in medications.  No bleeding/bruising.  Will continue current coumadin dose and will check PT/INR next week with home monitor.  If INR at goal, will f/u with Ms Keturah in 2 weeks.  Otherwise will call her next week.

## 2013-08-08 ENCOUNTER — Ambulatory Visit
Admission: RE | Admit: 2013-08-08 | Discharge: 2013-08-08 | Disposition: A | Discharge status: Home / Self Care | Payer: No Typology Code available for payment source | Source: Ambulatory Visit | Attending: Infectious Disease | Admitting: Infectious Disease

## 2013-08-08 ENCOUNTER — Ambulatory Visit (INDEPENDENT_AMBULATORY_CARE_PROVIDER_SITE_OTHER): Payer: Self-pay | Admitting: Pharmacist

## 2013-08-08 ENCOUNTER — Ambulatory Visit: Payer: BC Managed Care – PPO | Admitting: Family Medicine

## 2013-08-08 ENCOUNTER — Telehealth: Payer: Self-pay

## 2013-08-08 ENCOUNTER — Other Ambulatory Visit: Payer: Self-pay | Admitting: Infectious Disease

## 2013-08-08 DIAGNOSIS — R7611 Nonspecific reaction to tuberculin skin test without active tuberculosis: Secondary | ICD-10-CM

## 2013-08-08 DIAGNOSIS — I2699 Other pulmonary embolism without acute cor pulmonale: Secondary | ICD-10-CM

## 2013-08-08 LAB — POCT INR: INR: 2.7

## 2013-08-08 NOTE — Patient Instructions (Signed)
Continue coumadin 3.75mg  daily except 7.5mg  on Mon &Fri. Recheck INR with home monitor on 08/15/13.  If INR at goal will NOT call and will f/u on 08/22/13.

## 2013-08-08 NOTE — Progress Notes (Signed)
INR at goal patient has been stable on this dose No Charge - Phone Encounter Continue coumadin 3.75mg  daily except 7.5mg  on Mon &Fri. Recheck INR with home monitor on 08/15/13.

## 2013-08-08 NOTE — Telephone Encounter (Signed)
Patient came into the office this morning for a TB skin test. Patient had test done at [facility] on10-29-14 and went back and got it read on 08-06-13. When patient got Skin test read it was positive. Per doctor Dr. Caryl Never patient was told to go to the [facility]. So they can observe the area.

## 2013-08-11 ENCOUNTER — Other Ambulatory Visit: Payer: Self-pay

## 2013-08-15 ENCOUNTER — Ambulatory Visit (INDEPENDENT_AMBULATORY_CARE_PROVIDER_SITE_OTHER): Payer: BC Managed Care – PPO | Admitting: Pharmacist

## 2013-08-15 DIAGNOSIS — I2699 Other pulmonary embolism without acute cor pulmonale: Secondary | ICD-10-CM

## 2013-08-15 LAB — POCT INR: INR: 2.6

## 2013-08-15 NOTE — Progress Notes (Signed)
INR = 2.6 Pt remains stable on her Coumadin dosed at 3.75 mg daily except 7.5 mg on Mon/Fri. NO call to pt today as she is therapeutic. Pt will check her INR next week (10/17) & we will call her then to discuss results. NO CHARGE.  Pt uses home monitor. Ebony Hail, Pharm.D., CPP 08/15/2013@8 :39 AM

## 2013-08-19 ENCOUNTER — Telehealth: Payer: Self-pay | Admitting: Hematology & Oncology

## 2013-08-19 ENCOUNTER — Ambulatory Visit: Payer: BC Managed Care – PPO | Admitting: Hematology & Oncology

## 2013-08-19 ENCOUNTER — Other Ambulatory Visit: Payer: BC Managed Care – PPO | Admitting: Lab

## 2013-08-19 NOTE — Telephone Encounter (Signed)
Left pt message to call and reschedule no show

## 2013-08-22 ENCOUNTER — Ambulatory Visit: Payer: BC Managed Care – PPO | Admitting: Pharmacist

## 2013-08-22 DIAGNOSIS — I2699 Other pulmonary embolism without acute cor pulmonale: Secondary | ICD-10-CM

## 2013-08-22 LAB — POCT INR: INR: 2

## 2013-08-22 NOTE — Progress Notes (Signed)
INR at goal today Telephone encounter - No Charge - Pt is doing well with no complaints Pt has been stable on current dose No issues with bleeding or bruising No missed doses or extra doses No medication or diet changes Plan to: Continue coumadin 3.75mg  daily except 7.5mg  on Mon &Fri. Recheck INR with home monitor in 1 week on 08/29/13.  If INR at goal pt can be called in 2 weeks on 09/05/13 with her next INR check Received form from home monitoring system requesting patient information. This will be faxed in to the company

## 2013-08-22 NOTE — Patient Instructions (Signed)
INR at goal No changes Continue coumadin 3.75mg  daily except 7.5mg  on Mon &Fri. Recheck INR with home monitor on 08/29/13.  If INR at goal we will not call until the week after on 09/05/13

## 2013-08-25 ENCOUNTER — Telehealth: Payer: Self-pay | Admitting: Obstetrics & Gynecology

## 2013-08-29 ENCOUNTER — Encounter: Payer: Self-pay | Admitting: Pharmacist

## 2013-08-29 NOTE — Progress Notes (Signed)
I spoke to Kimberly Monroe.  She is waiting for more strips to check her PT/INR.  I told her we would follow up with her next week.

## 2013-09-05 ENCOUNTER — Telehealth: Payer: Self-pay | Admitting: Pharmacist

## 2013-09-09 ENCOUNTER — Ambulatory Visit (INDEPENDENT_AMBULATORY_CARE_PROVIDER_SITE_OTHER): Payer: BC Managed Care – PPO | Admitting: Pharmacist

## 2013-09-09 DIAGNOSIS — I2699 Other pulmonary embolism without acute cor pulmonale: Secondary | ICD-10-CM

## 2013-09-09 NOTE — Progress Notes (Signed)
Telephone encounter **No Charge**  INR below goal today.  I spoke to Kimberly Monroe on the phone.  There are no changes in medications.  No other changes to report.  Kimberly Prell will take coumadin 7.5mg  today and tomorrow then resume coumadin 3.75mg  daily except 7.5mg  on M/F.  She will check PT/INR on Monday b/c Kimberly Arruda prefers to check PT/INR weekly on mondays.  If INR at goal on Monday 09/12/13, we will not call Kimberly Killough and will f/u with her the following week 12/15 after we receiving PT/INR.

## 2013-09-12 ENCOUNTER — Ambulatory Visit: Payer: BC Managed Care – PPO | Admitting: Pharmacist

## 2013-09-12 DIAGNOSIS — I2699 Other pulmonary embolism without acute cor pulmonale: Secondary | ICD-10-CM

## 2013-09-12 NOTE — Patient Instructions (Signed)
Take Coumadin 7.5mg  today and tomorrow, then continue coumadin 3.75mg  daily except 7.5mg  on Mon &Fri. Recheck INR with home monitor in two weeks.

## 2013-09-12 NOTE — Progress Notes (Signed)
Patient checks her INR at home Spoke with patient over phone No changes to report Take Coumadin 7.5mg  today and tomorrow, then continue coumadin 3.75mg  daily except 7.5mg  on Mon &Fri. Recheck INR with home monitor in two weeks (approx 09/26/13).

## 2013-09-19 ENCOUNTER — Ambulatory Visit (INDEPENDENT_AMBULATORY_CARE_PROVIDER_SITE_OTHER): Payer: BC Managed Care – PPO | Admitting: Pharmacist

## 2013-09-19 DIAGNOSIS — I2699 Other pulmonary embolism without acute cor pulmonale: Secondary | ICD-10-CM

## 2013-09-19 NOTE — Progress Notes (Signed)
NO charge - pt not seen in clinic INR received today via fax from home Monitor INR at goal Pt is stable, pt is being called every other week as long as INR at goal, pt called last week, will call patient next week when results are in Plan: No changes continue coumadin 3.75mg  daily except 7.5mg  on Mon &Fri. Recheck INR with home monitor next week.

## 2013-09-19 NOTE — Patient Instructions (Signed)
INR at goal No changes continue coumadin 3.75mg  daily except 7.5mg  on Mon &Fri. Recheck INR with home monitor next week.

## 2013-09-20 ENCOUNTER — Encounter (HOSPITAL_COMMUNITY): Payer: Self-pay | Admitting: Emergency Medicine

## 2013-09-20 ENCOUNTER — Inpatient Hospital Stay (HOSPITAL_COMMUNITY)
Admission: RE | Admit: 2013-09-20 | Discharge: 2013-09-20 | Disposition: A | Discharge status: Home / Self Care | Payer: BC Managed Care – PPO | Source: Home / Self Care | Attending: Psychiatry | Admitting: Psychiatry

## 2013-09-20 ENCOUNTER — Ambulatory Visit (HOSPITAL_COMMUNITY)
Admission: RE | Admit: 2013-09-20 | Discharge: 2013-09-20 | Disposition: A | Discharge status: Home / Self Care | Payer: BC Managed Care – PPO | Attending: Psychiatry | Admitting: Psychiatry

## 2013-09-20 ENCOUNTER — Emergency Department (HOSPITAL_COMMUNITY)
Admission: EM | Admit: 2013-09-20 | Discharge: 2013-09-20 | Disposition: A | Discharge status: Other Acute Inpatient Hospital | Payer: BC Managed Care – PPO | Attending: Emergency Medicine | Admitting: Emergency Medicine

## 2013-09-20 DIAGNOSIS — Z862 Personal history of diseases of the blood and blood-forming organs and certain disorders involving the immune mechanism: Secondary | ICD-10-CM | POA: Insufficient documentation

## 2013-09-20 DIAGNOSIS — Z8543 Personal history of malignant neoplasm of ovary: Secondary | ICD-10-CM | POA: Insufficient documentation

## 2013-09-20 DIAGNOSIS — E871 Hypo-osmolality and hyponatremia: Secondary | ICD-10-CM | POA: Insufficient documentation

## 2013-09-20 DIAGNOSIS — E876 Hypokalemia: Secondary | ICD-10-CM | POA: Insufficient documentation

## 2013-09-20 DIAGNOSIS — Z0289 Encounter for other administrative examinations: Secondary | ICD-10-CM | POA: Insufficient documentation

## 2013-09-20 DIAGNOSIS — Z7901 Long term (current) use of anticoagulants: Secondary | ICD-10-CM | POA: Insufficient documentation

## 2013-09-20 DIAGNOSIS — F419 Anxiety disorder, unspecified: Secondary | ICD-10-CM

## 2013-09-20 DIAGNOSIS — R45851 Suicidal ideations: Secondary | ICD-10-CM

## 2013-09-20 DIAGNOSIS — F329 Major depressive disorder, single episode, unspecified: Secondary | ICD-10-CM | POA: Diagnosis present

## 2013-09-20 DIAGNOSIS — J9600 Acute respiratory failure, unspecified whether with hypoxia or hypercapnia: Secondary | ICD-10-CM | POA: Insufficient documentation

## 2013-09-20 DIAGNOSIS — D509 Iron deficiency anemia, unspecified: Secondary | ICD-10-CM | POA: Insufficient documentation

## 2013-09-20 DIAGNOSIS — Z86711 Personal history of pulmonary embolism: Secondary | ICD-10-CM | POA: Insufficient documentation

## 2013-09-20 DIAGNOSIS — F411 Generalized anxiety disorder: Secondary | ICD-10-CM | POA: Insufficient documentation

## 2013-09-20 DIAGNOSIS — Z3202 Encounter for pregnancy test, result negative: Secondary | ICD-10-CM | POA: Insufficient documentation

## 2013-09-20 DIAGNOSIS — Z79899 Other long term (current) drug therapy: Secondary | ICD-10-CM | POA: Insufficient documentation

## 2013-09-20 DIAGNOSIS — Z880 Allergy status to penicillin: Secondary | ICD-10-CM | POA: Insufficient documentation

## 2013-09-20 DIAGNOSIS — F3289 Other specified depressive episodes: Secondary | ICD-10-CM | POA: Insufficient documentation

## 2013-09-20 DIAGNOSIS — C439 Malignant melanoma of skin, unspecified: Secondary | ICD-10-CM | POA: Insufficient documentation

## 2013-09-20 DIAGNOSIS — F32A Depression, unspecified: Secondary | ICD-10-CM

## 2013-09-20 LAB — CBC
HCT: 44.5 % (ref 36.0–46.0)
MCHC: 34.8 g/dL (ref 30.0–36.0)
MCV: 88.8 fL (ref 78.0–100.0)
RBC: 5.01 MIL/uL (ref 3.87–5.11)
RDW: 14.3 % (ref 11.5–15.5)

## 2013-09-20 LAB — RAPID URINE DRUG SCREEN, HOSP PERFORMED
Amphetamines: NOT DETECTED
Barbiturates: NOT DETECTED
Cocaine: NOT DETECTED
Opiates: NOT DETECTED
Tetrahydrocannabinol: NOT DETECTED

## 2013-09-20 LAB — COMPREHENSIVE METABOLIC PANEL
ALT: 62 U/L — ABNORMAL HIGH (ref 0–35)
Albumin: 4 g/dL (ref 3.5–5.2)
BUN: 9 mg/dL (ref 6–23)
Chloride: 101 mEq/L (ref 96–112)
Creatinine, Ser: 0.7 mg/dL (ref 0.50–1.10)
GFR calc Af Amer: >90 mL/min (ref >90)
GFR calc non Af Amer: >90 mL/min (ref >90)
Glucose, Bld: 97 mg/dL (ref 70–99)
Total Bilirubin: 0.5 mg/dL (ref 0.3–1.2)
Total Protein: 7.4 g/dL (ref 6.0–8.3)

## 2013-09-20 LAB — ETHANOL: Alcohol, Ethyl (B): <11 mg/dL (ref 0–11)

## 2013-09-20 MED ORDER — CLONAZEPAM 0.5 MG PO TABS
1.0000 mg | ORAL_TABLET | ORAL | Status: DC
  Filled 2013-09-20: qty 2

## 2013-09-20 MED ORDER — WARFARIN SODIUM 7.5 MG PO TABS: 7.5000 mg | ORAL_TABLET | ORAL | Status: DC

## 2013-09-20 MED ORDER — WARFARIN - PHARMACIST DOSING INPATIENT
Freq: Every day | Status: DC
  Filled 2013-09-20 (×9): qty 1

## 2013-09-20 MED ORDER — WARFARIN SODIUM 7.5 MG PO TABS
3.7500 mg | ORAL_TABLET | ORAL | Status: DC
  Filled 2013-09-20: qty 0.5

## 2013-09-20 NOTE — Progress Notes (Signed)
ANTICOAGULATION CONSULT NOTE - Follow Up Consult  Pharmacy Consult for Coumadin Indication:h/o clotting disorder  Allergies  Allergen Reactions  . Penicillins Other (See Comments)    As a child Possibility-patient has never taken it    Patient Measurements:     Vital Signs: Temp: 98.3 F (36.8 C) (12/16 1404) Temp src: Oral (12/16 1404) BP: 150/92 mmHg (12/16 1404) Pulse Rate: 69 (12/16 1404)  Labs:  Recent Labs  09/19/13 1200 09/20/13 1215  HGB  --  15.5*  HCT  --  44.5  PLT  --  313  INR 2.1  --   CREATININE  --  0.70    The CrCl is unknown because both a height and weight (above a minimum accepted value) are required for this calculation.   Medications:  Per [facility] note dated 12/15 patients coumadin is scheduled as 3.75mg  daily except 7.5 mg on mon and Friday. Other meds will be verified on arrival of patient   Assessment: INR at goal.    Goal of Therapy:  INR 2-3    Plan:  Continue Coumadin as ordered per oncology pharmacist on 12/15 for now Coumadin 3.75 daily except 7.5 on mon and Friday.  PT/INR Thursday am labs as patient has been monitored every other week per oncology note. Will review coumadin and other orders in am.  Charyl Dancer 09/20/2013,3:28 PM

## 2013-09-20 NOTE — BH Assessment (Signed)
Pt accepted to [facility] by Serena Colonel, NP pending medical clearance. Patient was sent to [facility] via Juel Burrow. Writer notified TTS staff-Ava and charge nurse-Stacy that patient was accepted to [facility] pending medical clearance.   Writer received a call shortly after patient arrived to the [facility] from patient's nurse "Jenn".Britt Boozer states that patient is not cooperative with answering questions and she wants to discharge home. Jenn sts that patient will not confirm or deny if she is suicidal.   Clinical research associate confirmed with Jenn that patient did in fact do the same here at [facility] as she walked in here first. She was assessed. Pt made comments during the assessment such as "I would not tell you if I was suicidal", "I would not tell you if I had a suicidal plan", "That's not what suicidal people do they just do it", and "I am here b/c I need help or else I wouldn't have came here". Writer shared these comments with patient's nurse Jenn.  Patient also made comments related to 3 friends whom recently committed suicide. Pt stated that each friend reached out for help but were all sent home and committed suicide that night. Pt then stated, "Wow your going to really help me that's surprising, and "Why don't you just let me go home that's what my other friends did".   Writer informed patients nurse that this patient doesn't seem safe at this time to discharge. This is evidenced by  patient's comments during our TTS assessment, her demeanor, affect, inability to confirm or deny suiciality with this Clinical research associate or [employer] staff.   Writer discussed clincal's with psychiatrist Dr. Ladona Ridgel. Writer asked Dr. Ladona Ridgel to evaluate this patient b/c she was asking to discharge from the ER. Writ Dr. Ladona Ridgel did in fact evaluate this patient and he recommended inpatient treatment.   Patient was sent back to [facility] for her inpatient admission after medial clearance. Pt arrived with no support paperwork and refused sign any support paperwork upon arrival. Writer contacted  TTS-Ava staff at [facility]. Per Ava patient was discharged from the ED before she could complete this patient's support paperwork.   AC-Eric did try to speak to patient about her options Voluntary vs. Involuntary. Patient refused to sign the paperwork with him.   Writer also tried to speak with this patient and encourage her to sign herself into [facility]. Pt  took out her cell phone, which she had in her possession. Writer not sure why this patient was allowed to have a cell phone as patient's are not supposed to when they come behind the door for a inpatient admission. She then called her sister whom she stated was a Pensions consultant. Patient then asked her sister to look up the number at [facility] b/c she wanted to speak with Dr. Ladona Ridgel. Patient and sister on a 3 way call called the ED and spoke to Dr. Ladona Ridgel. Additionally, writer witnessed patient taking out 2-3 bottles of pills from her pockets. Patient stated, "I have my own pills I don't know why they were trying to give me medication at [facility]".   Writer notified [facility] that this patient refused to sign a voluntary form, has a cell phone, and also 2-3 bottles of pills she pulled from her pocket. Writer and [facility] both went to patient's room. Patient was apparently on the phone with Dr. Ladona Ridgel. Patient ended the phone call by stating,"Dr. Ladona Ridgel said I could go home". Eric the [facility] then received a phone call from Dr. Ladona Ridgel whom confirmed that patient could be discharged  home.   Maurine Minister and Dr. Lucianne Muss made aware of this situation. Dr. Lucianne Muss instructed this writer to make a follow up outpatient appointment for this patient.  Writer expressed concerns to [facility] about patient's presentation, comments, etc. Writer very concerned about patient discharging home. However, this writer made a therapy outpatient appointment with the outpatient clinic for this patient. Patient has a confirmed appointment with Boneta Lucks October 12, 2012 @ 10:30am as her outpatient follow up plan. Pt also given  information to mobile crises and the # for the [facility] assessment dept.

## 2013-09-20 NOTE — Consult Note (Signed)
Note reviewed and agreed with  

## 2013-09-20 NOTE — Treatment Plan (Signed)
Dr. Ladona Ridgel called writer to report that pt and pt's sister, an attorney, had called him to reconsider his opinion for pt to be IVC'd if unwilling to come in voluntary.  Dr. Ladona Ridgel reported that pt's sister felt confident that pt was not suicidal as they talk on a daily basis and that the pt usually has difficulty answering questions directly.  He also reported hearing the pt say over the phone that she is not suicidal.  Therefore, Dr. Ladona Ridgel was comfortable discharging the pt home with an outpt. Referral.

## 2013-09-20 NOTE — ED Notes (Signed)
Went into room to triage pt. Obtained vital signs. Pt states she is here for medical clearance. I attempted to clarify what type of medical clearance the patient needed. She states she is "here for help." Pt denies pain. Pt states "I have already talked to the psychiatrist, I am not talking with anyone else." I explained the medical clearance process to the patient as well as the difference between medical and psychiatric care. Pt still refuses to talk with staff.

## 2013-09-20 NOTE — Consult Note (Signed)
Subjective:  Patient states that she was at [facility] prior to coming to [facility] and was told that she needed to come to get some blood work done.  "I came here just to get blood work not to answer the same questions that I have just answered.   If you can't help me and are giving me the run around then I'll just leave. Patient refusing to answer the question if she is suicidal. Patient states "I just had 3 friends to kill themselves and I didn't think that they would do that.  I am not going to say I will but I am not saying that I won't either."  Patient is tearful and irritable.  Patient states that she came to the hospital for help.    Axis I: Major depressive disorder Axis II: Deferred Axis III:  Past Medical History  Diagnosis Date  . Depression   . History of blood clotting disorder   . Anemia   . Anxiety   . Allergy     seasonal  . Pulmonary embolism   . Melanoma 2009    insitu  . Basal cell cancer   . Squamous cell carcinoma   . Ovarian cancer 1995    left ovary  . Migraine     history of/none in years  . Menorrhagia 02/10/2013   Axis IV: other psychosocial or environmental problems and problems related to social environment Axis V: 11-20 some danger of hurting self or others possible OR occasionally fails to maintain minimal personal hygiene OR gross impairment in communication  Psychiatric Specialty Exam: Physical Exam  ROS  Blood pressure 156/75, pulse 63, temperature 98.4 F (36.9 C), temperature source Oral, resp. rate 18, SpO2 99.00%.There is no weight on file to calculate BMI.  General Appearance: Casual  Eye Contact::  Good  Speech:  Clear and Coherent and Normal Rate  Volume:  Increased  Mood:  Anxious, Depressed, Hopeless and Irritable  Affect:  Depressed and Flat  Thought Process:  Circumstantial  Orientation:  Full (Time, Place, and Person)  Thought Content:  "I am upset that I am asked the same questions over and over"  Suicidal Thoughts:  Yes.  without  intent/plan  Homicidal Thoughts:  No  Memory:  Immediate;   Good Recent;   Good  Judgement:  Impaired  Insight:  Lacking  Psychomotor Activity:  Normal  Concentration:  Good  Recall:  Good  Akathisia:  No  Handed:  Right  AIMS (if indicated):     Assets:  Communication Skills Desire for Improvement Housing  Sleep:      Face to face consult/interview with Dr. Ladona Ridgel  Disposition:  Inpatient treatment recommended.  Patient accepted to [facility] 508/01.  Patient may be transferred once medically cleared.    Shuvon B. Rankin FNP-BC Family Nurse Practitioner, Board Certified

## 2013-09-20 NOTE — ED Provider Notes (Signed)
CSN: [id number]     Arrival date & time 09/20/13  1039 History   First MD Initiated Contact with Patient 09/20/13 1119     Chief Complaint  Patient presents with  . Medical Clearance   (Consider location/radiation/quality/duration/timing/severity/associated sxs/prior Treatment) The history is provided by the patient.  pt w hx anxiety and depression, states here with feelings of increased anxiety and depression in the past few weeks. Notes multiple stressors related to work, recent surgery, loss of family/friend, and several other factors. Drove self to [facility], was assessed, plan for inpt admission, and sent to ed for medical clearance labs. Pt states physical health at baseline. Takes coumadin for hx pe-  Had inr checked yesterday per normal routine. States compliant w normal meds, takes klonopin for anxiety. Denies etoh or substance abuse. No SI.    Past Medical History  Diagnosis Date  . Depression   . History of blood clotting disorder   . Anemia   . Anxiety   . Allergy     seasonal  . Pulmonary embolism   . Melanoma 2009    insitu  . Basal cell cancer   . Squamous cell carcinoma   . Ovarian cancer 1995    left ovary  . Migraine     history of/none in years  . Menorrhagia 02/10/2013   Past Surgical History  Procedure Laterality Date  . Cesarean section  1996, 2000    twins, singleton  . Breast ductal system excision  2009    L intraductal papilloma  . Laparoscopy with ovarian cystectomy  1994    ovarian torsion  . 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 Mohs surgery  2009    Dr Park Liter   Family History  Problem Relation Age of Onset  . Cancer Father 5    colon cancer  . Colon cancer Father   . Cancer Paternal Uncle     prostate  . Breast cancer Mother 7   History  Substance Use Topics  . Smoking status: Never Smoker   . Smokeless tobacco: Never Used  . Alcohol Use: Yes     Comment: wine-occassionally   OB History   Grav Para Term Preterm Abortions TAB  SAB Ect Mult Living   2 2 1 1     1 3      Review of Systems  Constitutional: Negative for fever.  HENT: Negative for sore throat.   Eyes: Negative for visual disturbance.  Respiratory: Negative for shortness of breath.   Cardiovascular: Negative for chest pain.  Gastrointestinal: Negative for abdominal pain.  Genitourinary: Negative for flank pain.  Musculoskeletal: Negative for back pain.  Skin: Negative for rash.  Neurological: Negative for headaches.  Hematological: Does not bruise/bleed easily.  Psychiatric/Behavioral: Positive for dysphoric mood. The patient is nervous/anxious.     Allergies  Penicillins  Home Medications   Current Outpatient Rx  Name  Route  Sig  Dispense  Refill  . clonazePAM (KLONOPIN) 1 MG tablet   Oral   Take 1 mg by mouth as directed. Pt takes 1mg  once daily to twice daily.         Marland Kitchen HYDROcodone-acetaminophen (NORCO/VICODIN) 5-325 MG per tablet      1-2 tabs PO q 4 hours prn pain         . ibuprofen (ADVIL,MOTRIN) 600 MG tablet   Oral   Take 600 mg by mouth as needed for pain.         Marland Kitchen warfarin (COUMADIN)  7.5 MG tablet   Oral   Take 7.5 mg by mouth daily. Adjusted at coumadin clinic 7.5mg  q day, except 1/2 tab Mon/Wed/Fri         . warfarin (COUMADIN) 7.5 MG tablet      TAKE 1 TABLET (7.5 MG TOTAL) BY MOUTH DAILY.   30 tablet   2    BP 156/75  Pulse 63  Temp(Src) 98.4 F (36.9 C) (Oral)  Resp 18  SpO2 99% Physical Exam  Nursing note and vitals reviewed. Constitutional: She is oriented to person, place, and time. She appears well-developed and well-nourished. No distress.  HENT:  Mouth/Throat: Oropharynx is clear and moist.  Eyes: Conjunctivae are normal. No scleral icterus.  Neck: Neck supple. No tracheal deviation present.  Cardiovascular: Normal rate, regular rhythm, normal heart sounds and intact distal pulses.   Pulmonary/Chest: Effort normal and breath sounds normal. No respiratory distress.  Abdominal: Normal  appearance. She exhibits no distension.  Musculoskeletal: She exhibits no edema.  Neurological: She is alert and oriented to person, place, and time.  Steady gait.   Skin: Skin is warm and dry. No rash noted. She is not diaphoretic.  Psychiatric:  Anxious.  Depressed mood although at times becomes agitated about being asked questions multiple x by triage staff. Denies SI.       ED Course  Procedures (including critical care time)   Results for orders placed during the hospital encounter of 09/20/13  ACETAMINOPHEN LEVEL      Result Value Range   Acetaminophen (Tylenol), Serum <15.0  10 - 30 ug/mL  CBC      Result Value Range   WBC 8.2  4.0 - 10.5 K/uL   RBC 5.01  3.87 - 5.11 MIL/uL   Hemoglobin 15.5 (*) 12.0 - 15.0 g/dL   HCT 09.8  11.9 - 14.7 %   MCV 88.8  78.0 - 100.0 fL   MCH 30.9  26.0 - 34.0 pg   MCHC 34.8  30.0 - 36.0 g/dL   RDW 82.9  56.2 - 13.0 %   Platelets 313  150 - 400 K/uL  COMPREHENSIVE METABOLIC PANEL      Result Value Range   Sodium 139  135 - 145 mEq/L   Potassium 4.2  3.5 - 5.1 mEq/L   Chloride 101  96 - 112 mEq/L   CO2 26  19 - 32 mEq/L   Glucose, Bld 97  70 - 99 mg/dL   BUN 9  6 - 23 mg/dL   Creatinine, Ser 8.65  0.50 - 1.10 mg/dL   Calcium 78.4  8.4 - 69.6 mg/dL   Total Protein 7.4  6.0 - 8.3 g/dL   Albumin 4.0  3.5 - 5.2 g/dL   AST 38 (*) 0 - 37 U/L   ALT 62 (*) 0 - 35 U/L   Alkaline Phosphatase 95  39 - 117 U/L   Total Bilirubin 0.5  0.3 - 1.2 mg/dL   GFR calc non Af Amer >90  >90 mL/min   GFR calc Af Amer >90  >90 mL/min  ETHANOL      Result Value Range   Alcohol, Ethyl (B) <11  0 - 11 mg/dL  SALICYLATE LEVEL      Result Value Range   Salicylate Lvl <2.0 (*) 2.8 - 20.0 mg/dL  URINE RAPID DRUG SCREEN (HOSP PERFORMED)      Result Value Range   Opiates NONE DETECTED  NONE DETECTED   Cocaine NONE DETECTED  NONE DETECTED  Benzodiazepines POSITIVE (*) NONE DETECTED   Amphetamines NONE DETECTED  NONE DETECTED   Tetrahydrocannabinol NONE  DETECTED  NONE DETECTED   Barbiturates NONE DETECTED  NONE DETECTED  POCT PREGNANCY, URINE      Result Value Range   Preg Test, Ur NEGATIVE  NEGATIVE      EKG Interpretation   None       MDM  Labs.  Reviewed nursing notes and prior charts for additional history.   Pt has been assessed at [facility], and appears that bed is ready.     Suzi Roots, MD 09/20/13 [phone]

## 2013-09-20 NOTE — ED Notes (Signed)
Patient has refused blood draw for labs.RN Riki Rusk made aware
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
## 2013-09-23 NOTE — Progress Notes (Signed)
Patient Discharge Instructions:  Next Level Care Provider Has Access to the EMR, 09/23/13 Records provided to [facility] via CH[facility]/epic access.  Jerelene Redden, 09/23/2013, 12:27 PM

## 2013-09-26 ENCOUNTER — Ambulatory Visit: Payer: BC Managed Care – PPO | Admitting: Pharmacist

## 2013-09-26 DIAGNOSIS — I2699 Other pulmonary embolism without acute cor pulmonale: Secondary | ICD-10-CM

## 2013-09-26 LAB — POCT INR: INR: 2

## 2013-09-26 NOTE — Progress Notes (Signed)
INR = 2 on Coumadin 3.75 mg/day; 7.5 mg Mon/Fri. No missed doses. No recent med changes. Pt may run out of test strips to check her INR.  She cannot get more from [facility] until 10/21/13.  She has had therapeutic INR's for a while so if she misses a few checks before then, she knows it is ok from our standpoint.  She is faithful to check when she has the supplies. INR at goal.  No change needed to her dose. Recheck weekly.  If her INR is at goal next Monday, pt knows we will not call her.  We call every other week. NO CHARGE- Phone encounter. Ebony Hail, Pharm.D., CPP 09/26/2013@8 :35 AM

## 2013-09-28 ENCOUNTER — Other Ambulatory Visit: Payer: Self-pay | Admitting: Hematology & Oncology

## 2013-09-30 ENCOUNTER — Other Ambulatory Visit: Payer: Self-pay | Admitting: Nurse Practitioner

## 2013-09-30 MED ORDER — WARFARIN SODIUM 7.5 MG PO TABS: ORAL_TABLET | ORAL | Status: DC

## 2013-10-03 ENCOUNTER — Ambulatory Visit (INDEPENDENT_AMBULATORY_CARE_PROVIDER_SITE_OTHER): Payer: Self-pay | Admitting: Pharmacist

## 2013-10-03 DIAGNOSIS — I2699 Other pulmonary embolism without acute cor pulmonale: Secondary | ICD-10-CM

## 2013-10-03 NOTE — Progress Notes (Signed)
*  Telephone Encounter - No Charge* INR below goal today  Pt is usually very stable on 3.75 mg daily except for 7.5 mg on Mon & Fri Pt reports no missed doses No unusual bleeding or bruising No medication changes Pt states she did eat a lot more "greens" and collards last week likely resulting in a subtherapeutic INR Plan: Take 7.5 mg today and Tomorrow then continue Coumadin 3.75mg  daily except 7.5mg  on Mon &Fri. Recheck INR with home monitor next week.

## 2013-10-03 NOTE — Patient Instructions (Signed)
INR below goal likely due to holiday diet last week with "greens" Take 7.5 mg today and Tomorrow then continue Coumadin 3.75mg  daily except 7.5mg  on Mon &Fri. Recheck INR with home monitor next week.

## 2013-10-10 ENCOUNTER — Telehealth: Payer: Self-pay | Admitting: Pharmacist

## 2013-10-10 ENCOUNTER — Ambulatory Visit (INDEPENDENT_AMBULATORY_CARE_PROVIDER_SITE_OTHER): Payer: Self-pay | Admitting: Pharmacist

## 2013-10-10 DIAGNOSIS — I2699 Other pulmonary embolism without acute cor pulmonale: Secondary | ICD-10-CM

## 2013-10-10 LAB — POCT INR: INR: 1.9

## 2013-10-10 NOTE — Telephone Encounter (Signed)
Reviewed INR results and Coumadin dosing.  See anticoag notes from 10/10/13.

## 2013-10-10 NOTE — Patient Instructions (Signed)
Take 7.5 mg today (10/10/13) and tomorrow (10/11/13) On 10/12/13,  continue Coumadin 3.75mg  daily except 7.5mg  on Mon &Fri.  Recheck INR with home monitor next week.

## 2013-10-10 NOTE — Progress Notes (Signed)
INR essentially at goal today. INR increasing nicely although not yet at INR = 2. Pt took coumadin as instructed. No changes to report. No concerns regarding anticoagulation. No s/s of clotting noted. Take 7.5 mg today (10/10/13) and tomorrow (10/11/13). On 10/12/13,  continue Coumadin 3.75mg  daily except 7.5mg  on Mon &Fri.  Recheck INR with home monitor next week. Telephone encounter - no charge clinic patient.

## 2013-10-12 ENCOUNTER — Ambulatory Visit (HOSPITAL_COMMUNITY): Payer: Self-pay | Admitting: Psychiatry

## 2013-10-17 ENCOUNTER — Ambulatory Visit: Payer: Self-pay | Admitting: Pharmacist

## 2013-10-17 DIAGNOSIS — I2699 Other pulmonary embolism without acute cor pulmonale: Secondary | ICD-10-CM

## 2013-10-17 LAB — POCT INR: INR: 1.9

## 2013-10-17 NOTE — Progress Notes (Signed)
INR = 1.9 on Coumadin 3.75 mg/day; 7.5 mg Mon/Tu/Fri No med changes Diet stable per pt. INR is below goal despite 2 weeks of increased dose. We need to increase her Coumadin at this point since we've not had her in goal range since 09/26/13. I advised pt to increase Coumadin to 7.5 mg/day; 3.75 mg on Wed/Sat/Sun. Repeat INR next Mon using home monitor.  We'll call her w/ results & dosing instructions. NO CHARGE- phone encounter. Kennith Center, Pharm.D., CPP 10/17/2013@11 :16 AM

## 2013-10-21 NOTE — Telephone Encounter (Signed)
Patient called and requested rescheduling. Left message on patient's VM to call clinic M-F between 8am and 5pm.

## 2013-10-24 LAB — POCT INR: INR: 1.3

## 2013-10-25 ENCOUNTER — Ambulatory Visit: Payer: BC Managed Care – PPO | Admitting: Pharmacist

## 2013-10-25 DIAGNOSIS — I2699 Other pulmonary embolism without acute cor pulmonale: Secondary | ICD-10-CM

## 2013-10-25 NOTE — Patient Instructions (Signed)
Take Coumadin 11.25 mg today.  On 1/20 begin 7.5mg  daily except 3.75mg  on Sat & Sun.   Recheck INR with home monitor on 10/28/13.

## 2013-10-25 NOTE — Progress Notes (Signed)
INR below goal today. Pt took coumadin as instructed last week. No changes in diet or medications to report. No missed doses. No concerns regarding anticoagulation. No s/s of clotting noted. Pt concerned with INR results today. Not sure why INR decreased despite increased coumadin dose. Pt main concern is the number of attempts it took to have her INR resulted today.  Her machine had error readings twice before giving INR results = 1.3 on the third time. Pt plans to call the company to let them know about the increased use of testing strips for this week (with error readings on machine).  We discussed that if she has any problems obtaining refills for her strips, we can call in new prescription or update testing instructions with the company. Take Coumadin 11.25 mg today.  On 1/20 begin 7.5mg  daily except 3.75mg  on Sat & Sun.   Recheck INR with home monitor on 10/28/13. Pt feels more comfortable rechecking on 1/23 rather than waiting until 1/26. No charge encounter - spoke to patient by telephone.

## 2013-10-28 ENCOUNTER — Ambulatory Visit (INDEPENDENT_AMBULATORY_CARE_PROVIDER_SITE_OTHER): Payer: Self-pay | Admitting: Pharmacist

## 2013-10-28 DIAGNOSIS — I2699 Other pulmonary embolism without acute cor pulmonale: Secondary | ICD-10-CM

## 2013-10-28 LAB — POCT INR: INR: 2.7

## 2013-10-28 NOTE — Progress Notes (Signed)
INR = 2.7 today; drawn externally by pt using home monitor. She took 11.25 mg on 10/24/13 as instructed & now on 7.5 mg/day; 3.75 mg on Sat/Sun. Pt reports no SOB/CP when her INR was 1.3 on Monday 10/24/13. I will have pt continue w/ plan to increase her dose to 7.5 mg/day; 3.75 mg on Sat/Sun. Repeat protime this Mon 10/31/13 & we'll call her when we receive her results. NO CHARGE - phone encounter. Kennith Center, Pharm.D., CPP 10/28/2013@2 :05 PM

## 2013-10-31 ENCOUNTER — Telehealth: Payer: Self-pay | Admitting: Pharmacist

## 2013-10-31 NOTE — Telephone Encounter (Signed)
Spoke with patient regarding her INR level from at home monitoring INR=2.4 No changes. Nothing new with patient either. Continue coumadin 7.5 mg daily (M-F) and half tablet on weekends.

## 2013-11-07 ENCOUNTER — Ambulatory Visit: Payer: BC Managed Care – PPO | Admitting: Pharmacist

## 2013-11-07 DIAGNOSIS — I2699 Other pulmonary embolism without acute cor pulmonale: Secondary | ICD-10-CM

## 2013-11-07 LAB — POCT INR: INR: 1.8

## 2013-11-07 NOTE — Patient Instructions (Signed)
INR just below goal Take Coumadin 7.5mg  daily except 3.75mg  on Sat & Sun.   Recheck INR with home monitor on 11/14/13.

## 2013-11-07 NOTE — Progress Notes (Signed)
INR just below goal Patient is doing well with no complaints No issues regarding bleeding or bruising No missed doses  Or extra doses No diet changes Plan: Continue to take Coumadin 7.5mg  daily except 3.75mg  on Sat & Sun.   Recheck INR with home monitor on 11/14/13. Pt seems to be requiring increased doses, so may need to increase next week to 7.5 mg daily if INR continues to be subtherapeutic *No charge, Telephone Encounter*

## 2013-11-14 ENCOUNTER — Ambulatory Visit: Payer: BC Managed Care – PPO | Admitting: Pharmacist

## 2013-11-14 DIAGNOSIS — I2699 Other pulmonary embolism without acute cor pulmonale: Secondary | ICD-10-CM

## 2013-11-14 LAB — POCT INR: INR: 2.8

## 2013-11-14 NOTE — Patient Instructions (Signed)
INR at goal No changes Take Coumadin 7.5mg  daily except 3.75mg  on Sat & Sun.   Recheck INR with home monitor on 11/21/13.

## 2013-11-14 NOTE — Progress Notes (Signed)
*  Telephone Encounter No Charge*  INR at goal Patient is doing well with no complaints No unusual bleeding or bruising No missed or extra doses No diet or medication changes Plan: Take Coumadin 7.5mg  daily except 3.75mg  on Sat & Sun.   Recheck INR with home monitor on 11/21/13.

## 2013-11-22 ENCOUNTER — Ambulatory Visit (INDEPENDENT_AMBULATORY_CARE_PROVIDER_SITE_OTHER): Payer: BC Managed Care – PPO | Admitting: Pharmacist

## 2013-11-22 DIAGNOSIS — I2699 Other pulmonary embolism without acute cor pulmonale: Secondary | ICD-10-CM

## 2013-11-22 LAB — POCT INR: INR: 2.3

## 2013-11-22 NOTE — Patient Instructions (Signed)
INR at goal No changes Take Coumadin 7.5mg  daily except 3.75mg  on Sat & Sun.   Recheck INR with home monitor on 11/28/13.

## 2013-11-22 NOTE — Progress Notes (Signed)
*  Telephone Encounter* No charge INR at goal. Pt checks INR via home monitor and faxes in results Pt doing well No complaints No unusual bleeding or bruising  No missed or extra doses No medication or diet changes Plan: Continue Coumadin 7.5mg  daily except 3.75mg  on Sat & Sun.   Recheck INR with home monitor on 11/28/13.

## 2013-11-28 ENCOUNTER — Ambulatory Visit (INDEPENDENT_AMBULATORY_CARE_PROVIDER_SITE_OTHER): Payer: BC Managed Care – PPO | Admitting: Pharmacist

## 2013-11-28 DIAGNOSIS — I2699 Other pulmonary embolism without acute cor pulmonale: Secondary | ICD-10-CM

## 2013-11-28 LAB — POCT INR: INR: 3.2

## 2013-11-28 NOTE — Patient Instructions (Signed)
Take Coumadin 7.5mg  daily except 3.75mg  on Sat & Sun.   Recheck INR with home monitor on 12/05/13.

## 2013-11-28 NOTE — Progress Notes (Signed)
Spoke with patient over the phone Home test INR=3.2 7.5 mg daily with 3.75 on Sat and Sun No changes to report She will be moving to a different batch of strips starting next week Did not change dose Patient will continue with her weekly test on Monday 12/05/13

## 2013-12-05 ENCOUNTER — Ambulatory Visit: Payer: BC Managed Care – PPO | Admitting: Pharmacist

## 2013-12-05 DIAGNOSIS — I2699 Other pulmonary embolism without acute cor pulmonale: Secondary | ICD-10-CM

## 2013-12-05 LAB — POCT INR: INR: 3.5

## 2013-12-05 NOTE — Progress Notes (Signed)
INR = 3.5 on 7.5 mg daily except 3.75 mg on Sat/Sun. No bleeding. She started a new kit of protime testing strips today.   No diet changes.  No recent EtOH. INR above goal.  Take 3.75 mg today then resume usual dose. Recheck INR next week & if back in goal, we need to consider adding in an extra day of 3.75 mg (dose reduction). NO CHARGE- phone encounter Kennith Center, Pharm.D., CPP 12/05/2013_0 :18 PM

## 2013-12-08 ENCOUNTER — Ambulatory Visit (INDEPENDENT_AMBULATORY_CARE_PROVIDER_SITE_OTHER): Payer: Self-pay | Admitting: Pharmacist

## 2013-12-08 DIAGNOSIS — I2699 Other pulmonary embolism without acute cor pulmonale: Secondary | ICD-10-CM

## 2013-12-08 LAB — POCT INR: INR: 3.7

## 2013-12-08 NOTE — Progress Notes (Signed)
INR = 3.7   Goal range 2-3 Patient called today after noticing small amount of bleeding from mouth (lip) and ear yesterday evening. Patient's daughter also noticed she had a small cut on her neck that bled more than it should have yesterday. No bleeding today. No other symptoms. No dietary or medication changes. Patient states she suspects the last batch of test strips she had for her home monitoring machine did not work properly. She recently started a new pack and her INR is significantly higher. 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 She will skip her Coumadin dose today and will eat a single helping of spinach with dinner tonight. She will take Coumadin 3.75 mg daily and check her INR at home on Monday. We will call her Monday with further instructions after receiving the results of her home monitoring. She will call us if she has further episodes of bleeding.  NO CHARGE - telephone encounter only  Theone Murdoch, PharmD

## 2013-12-12 ENCOUNTER — Ambulatory Visit (INDEPENDENT_AMBULATORY_CARE_PROVIDER_SITE_OTHER): Payer: Self-pay | Admitting: Pharmacist

## 2013-12-12 DIAGNOSIS — I2699 Other pulmonary embolism without acute cor pulmonale: Secondary | ICD-10-CM

## 2013-12-12 LAB — POCT INR: INR: 1.7

## 2013-12-12 LAB — PROTIME-INR

## 2013-12-12 NOTE — Patient Instructions (Signed)
Take coumadin 3.75mg  daily except 7.5mg  on MWF. Recheck INR with home monitor on 12/19/13

## 2013-12-12 NOTE — Progress Notes (Signed)
INR slightly below goal today. Pt took coumadin as instructed on 12/08/13. She also had collard greens on 12/08/13. No changes in diet or medications. Bleeding resolved. No further events. No s/s of clotting noted. No further concerns regarding anticoagulation. Will slightly adjust coumadin dose to 3.75mg  daily except 7.5mg  on MWF. Recheck INR with home monitor on 12/19/13  Spoke with patient by telephone. No charge encounter.

## 2013-12-19 ENCOUNTER — Ambulatory Visit: Payer: BC Managed Care – PPO | Admitting: Pharmacist

## 2013-12-19 DIAGNOSIS — I2699 Other pulmonary embolism without acute cor pulmonale: Secondary | ICD-10-CM

## 2013-12-19 LAB — PROTIME-INR

## 2013-12-19 LAB — POCT INR: INR: 1.8

## 2013-12-19 NOTE — Progress Notes (Signed)
Spoke with patient over phone INR=1.8 today on home monitoring device No changes to report 7.5 mg MTWF and 3.75 mg on Sa, Su and Thur She will recheck INR next Monday.

## 2013-12-19 NOTE — Patient Instructions (Signed)
Take coumadin 3.75mg  on Sun, Thur and Sat except 7.5mg  on MTWF. Recheck INR with home monitor on 12/26/13.

## 2013-12-26 ENCOUNTER — Ambulatory Visit: Payer: BC Managed Care – PPO | Admitting: Pharmacist

## 2013-12-26 DIAGNOSIS — I2699 Other pulmonary embolism without acute cor pulmonale: Secondary | ICD-10-CM

## 2013-12-26 LAB — POCT INR: INR: 3

## 2013-12-26 NOTE — Progress Notes (Signed)
INR at goal Pt is doing well with no complaints *No Charge - Telephone encounter* Spoke to patient over the phone today Her INR is 3.0 on her home monitor She reports no missed or extra doses No unusual bleeding or bruising No medication or diet changes Pt states she received a letter in the mail from her home INR monitoring machine company stating that there may be a recall on her device and she should have her INR checked in the clinic to verify accuracy [name] was concerned about this so we will set her up next week to come into clinic and have her INR checked.  Plan: No changes Continue coumadin 3.75mg  on Sun, Thur and Sat except 7.5mg  on MTWF. Recheck INR at [facility] in [place] on 01/04/14.

## 2013-12-26 NOTE — Patient Instructions (Signed)
INR at goal No changes Continue coumadin 3.75mg  on Sun, Thur and Sat except 7.5mg  on MTWF. Recheck INR at [facility] in [place] on 01/04/14.

## 2013-12-30 ENCOUNTER — Encounter: Payer: Self-pay | Admitting: Hematology & Oncology

## 2014-01-02 ENCOUNTER — Telehealth: Payer: Self-pay | Admitting: Pharmacist

## 2014-01-02 LAB — PROTIME-INR

## 2014-01-02 NOTE — Telephone Encounter (Signed)
Received INR results today of 1.9. Called Kimberly Monroe to discuss results but was unable to reach her. Left voicemail with instructions to make no changes to regimen and to call with any questions. Also, patient is scheduled to have INR rechecked this week in the clinic on 01/04/14 at 8:15am due to report that Kimberly Monroe home INR monitor may be recalled due to inaccuracies.   Thank you,  Montel Clock, PharmD

## 2014-01-03 ENCOUNTER — Other Ambulatory Visit: Payer: Self-pay | Admitting: Pharmacist

## 2014-01-03 DIAGNOSIS — I2699 Other pulmonary embolism without acute cor pulmonale: Secondary | ICD-10-CM

## 2014-01-04 ENCOUNTER — Other Ambulatory Visit (HOSPITAL_BASED_OUTPATIENT_CLINIC_OR_DEPARTMENT_OTHER): Payer: BC Managed Care – PPO

## 2014-01-04 ENCOUNTER — Encounter: Payer: Self-pay | Admitting: Hematology & Oncology

## 2014-01-04 ENCOUNTER — Ambulatory Visit (HOSPITAL_BASED_OUTPATIENT_CLINIC_OR_DEPARTMENT_OTHER): Payer: Self-pay | Admitting: Pharmacist

## 2014-01-04 DIAGNOSIS — Z7901 Long term (current) use of anticoagulants: Secondary | ICD-10-CM

## 2014-01-04 DIAGNOSIS — I2699 Other pulmonary embolism without acute cor pulmonale: Secondary | ICD-10-CM

## 2014-01-04 LAB — PROTIME-INR
INR: 1.9 — ABNORMAL LOW (ref 2.00–3.50)
PROTIME: 22.8 s — AB (ref 10.6–13.4)

## 2014-01-04 LAB — POCT INR: INR: 1.9

## 2014-01-04 NOTE — Progress Notes (Signed)
INR = 1.9 on Coumadin 7.5 mg daily except 3.75 mg on Sat/Sun/Thurs. No missed doses recently. Pt got notification from [facility] stating that "in certain cases an INRatio Monitor system may provide an INR result that is significantly lower than a result obtained using a laboratory INR system..theophylline INRatio Monitor system should not be used if you have: anemia (Hct should be 30-55%; her Hct = 44.5% on 09/20/13); acute inflammatory conditions (acute viral or bacterial infections); chronic inflammatory conditions; severe infection (sepsis); advanced stage cancer or end stage renal disease requiring hemodialysis; any bleeding or unusual bruising" [facility] recommends pts arrange w/ their MD to have INR measured w/ lab INR method to help correlate w/ home monitor so this visit serves that purpose.  Pts INR on 01/02/14 = 1.9.  Her INR here today = 1.9. I believe that Colleen is fine to continue using her home monitor.  She will sign appropriate paperwork that [employer] sent to her & send it back to confirm that she received their notification and will continue to use her monitor based on discussion w/ her provider. She is set to come off her anticoagulation in the beginning of June this year to complete 2 years of anticoagulation per Dr. Marin Olp.   INR slightly below goal but last week her INR was near 3 on current dose.  Pt is asymptomatic for VTE. No change to her Coumadin dose.  Pt will recheck her INR at home Monday 01/09/14. Kennith Center, Pharm.D., CPP 01/04/2014@8 :55 AM

## 2014-01-09 ENCOUNTER — Telehealth: Payer: Self-pay | Admitting: Pharmacist

## 2014-01-10 ENCOUNTER — Telehealth: Payer: Self-pay | Admitting: Pharmacist

## 2014-01-10 NOTE — Telephone Encounter (Signed)
Expected faxed INR from patient today and did not receive.  Left VM asking patient to call Coumadin Clinic.

## 2014-01-11 ENCOUNTER — Ambulatory Visit: Payer: BC Managed Care – PPO | Admitting: Pharmacist

## 2014-01-11 DIAGNOSIS — I2699 Other pulmonary embolism without acute cor pulmonale: Secondary | ICD-10-CM

## 2014-01-11 LAB — POCT INR: INR: 1.6

## 2014-01-11 NOTE — Progress Notes (Signed)
Telephone encounter-No Charge* INR below goal today at 1.6. Pt states she ate multiple servings throughout her trip last week resulting in a low INR No missed or extra doses No unusual bleeding or bruising No s/s of clotting Plan:  Take 7.5 mg tomorrow (1 whole tablet) then Continue coumadin 3.75mg  on Sun, Thur and Sat except 7.5mg  on MTWF. Recheck INR at home 01/18/14

## 2014-01-11 NOTE — Patient Instructions (Signed)
INR below goal due to diet over the weekend while on vacation  Take 7.5 mg tomorrow (1 whole tablet) then Continue coumadin 3.75mg  on Sun, Thur and Sat except 7.5mg  on MTWF. Recheck INR at home 01/18/14

## 2014-01-17 ENCOUNTER — Ambulatory Visit (INDEPENDENT_AMBULATORY_CARE_PROVIDER_SITE_OTHER): Payer: Self-pay | Admitting: Pharmacist

## 2014-01-17 DIAGNOSIS — I2699 Other pulmonary embolism without acute cor pulmonale: Secondary | ICD-10-CM

## 2014-01-17 LAB — POCT INR: INR: 2.2

## 2014-01-17 NOTE — Patient Instructions (Signed)
INR at goal No changes Continue coumadin 3.75mg  on Sun, Thur and Sat except 7.5mg  on MTWF. Recheck INR at home 01/23/14

## 2014-01-17 NOTE — Progress Notes (Signed)
INR at goal Pt is doing well No unusual bleeding or bruising No missed or extra doses No medication or diet changes Kimberly Monroe is looking forward to coming off coumadin this summer (June 2015 planned) Anticoag plan: No changes Continue coumadin 3.75mg  on Sun, Thur and Sat except 7.5mg  on MTWF. Recheck INR at home 01/23/14 **Telephone encounter- No charge**

## 2014-01-25 ENCOUNTER — Encounter: Payer: Self-pay | Admitting: Pharmacist

## 2014-01-25 NOTE — Progress Notes (Signed)
Left VM to check PT/INR via home monitor.

## 2014-01-26 ENCOUNTER — Encounter: Payer: Self-pay | Admitting: Hematology & Oncology

## 2014-01-26 ENCOUNTER — Ambulatory Visit: Payer: BC Managed Care – PPO | Admitting: Pharmacist

## 2014-01-26 DIAGNOSIS — I2699 Other pulmonary embolism without acute cor pulmonale: Secondary | ICD-10-CM

## 2014-01-26 LAB — POCT INR: INR: 2.3

## 2014-01-26 NOTE — Progress Notes (Signed)
Spoke to pt on phone **no charge**  Ms Koike called stating that she had check her INR on home monitor and INR=2.3.  I could not find that these results were faxed to [facility].  Ms Ahuja has no medication changes or bleeding/bruising.  Will continue current coumadin dose of 3.75mg  Th/Sa/Sun and 7.5mg  other days.  Will f/u PT/INR in 2 weeks.

## 2014-01-30 LAB — POCT INR: INR: 1.5

## 2014-01-31 ENCOUNTER — Ambulatory Visit (INDEPENDENT_AMBULATORY_CARE_PROVIDER_SITE_OTHER): Payer: Self-pay | Admitting: Pharmacist

## 2014-01-31 DIAGNOSIS — I2699 Other pulmonary embolism without acute cor pulmonale: Secondary | ICD-10-CM

## 2014-01-31 NOTE — Patient Instructions (Signed)
Continue coumadin 3.75mg  on Sun, Thur and Sat except 7.5mg  on MTWF. Recheck INR at home 02/06/14.

## 2014-01-31 NOTE — Progress Notes (Signed)
Spoke with patient over the phone as she uses a home monitor INR was 1.5 She states she ate "a ton of spinach this past week" Stated it was not going to continue Continue coumadin 3.75mg  on Sun, Thur and Sat except 7.5mg  on MTWF. Recheck INR at home 02/06/14.

## 2014-02-06 ENCOUNTER — Telehealth: Payer: Self-pay | Admitting: Pharmacist

## 2014-02-06 ENCOUNTER — Encounter: Payer: Self-pay | Admitting: Hematology & Oncology

## 2014-02-06 LAB — PROTIME-INR

## 2014-02-06 NOTE — Telephone Encounter (Signed)
Called and left a VM for patient as we did not receive a call or fax with INR results for today. Instructed patient to call or have INR results faxed

## 2014-02-15 LAB — PROTIME-INR

## 2014-02-16 ENCOUNTER — Encounter: Payer: Self-pay | Admitting: Hematology & Oncology

## 2014-03-08 ENCOUNTER — Telehealth: Payer: Self-pay | Admitting: *Deleted

## 2014-03-08 ENCOUNTER — Telehealth: Payer: Self-pay | Admitting: Hematology & Oncology

## 2014-03-08 ENCOUNTER — Telehealth: Payer: Self-pay | Admitting: Pharmacist

## 2014-03-08 ENCOUNTER — Ambulatory Visit: Payer: BC Managed Care – PPO | Admitting: Pharmacist

## 2014-03-08 DIAGNOSIS — I2699 Other pulmonary embolism without acute cor pulmonale: Secondary | ICD-10-CM

## 2014-03-08 LAB — POCT INR: INR: 2.2

## 2014-03-08 NOTE — Telephone Encounter (Signed)
Patient called stating she is nervous about going off of her Coumadin as specified by Coumadin clinic on 6.6.15.  Patient is taking a long plane flight to [place] the end of June beginning of July and is worried about going off of coumadin.  Dr. Marin Olp suggests that she stay on Coumadin until after her flight and make an appt to see him before she goes off Coumadin.  Patient aware of above.

## 2014-03-08 NOTE — Progress Notes (Signed)
INR = 2.2 on 03/06/14 per pt using home monitor. We did not receive a result from the home monitoring company.  Pt states she is having problems w/ them.  She has been checking her INR's weekly on Mondays, however we have not had a report since the end of April.  She tells me today that her INR's have never been <1.9 or >3. She will remain on Coumadin until evaluated by Dr. Marin Olp 03/16/14.  She is anxious about traveling to [place] in July without being on Coumadin. Pt will continue taking Coumadin 7.5 mg daily except 3.75mg  on Sat/Sun/Thurs. Recheck INR on 6/8 at home.  We'll follow along for decision about discontinuing anticoag pending appt w/ Dr. Marin Olp. Kennith Center, Pharm.D., CPP 03/08/2014@2 :40 PM

## 2014-03-08 NOTE — Telephone Encounter (Signed)
Pt called made 6-11 appointment

## 2014-03-16 ENCOUNTER — Emergency Department (HOSPITAL_COMMUNITY)
Admission: EM | Admit: 2014-03-16 | Discharge: 2014-03-16 | Disposition: A | Discharge status: Home / Self Care | Payer: BC Managed Care – PPO | Attending: Emergency Medicine | Admitting: Emergency Medicine

## 2014-03-16 ENCOUNTER — Other Ambulatory Visit (HOSPITAL_BASED_OUTPATIENT_CLINIC_OR_DEPARTMENT_OTHER): Payer: BC Managed Care – PPO | Admitting: Lab

## 2014-03-16 ENCOUNTER — Ambulatory Visit (HOSPITAL_BASED_OUTPATIENT_CLINIC_OR_DEPARTMENT_OTHER): Payer: BC Managed Care – PPO | Admitting: Hematology & Oncology

## 2014-03-16 ENCOUNTER — Encounter: Payer: Self-pay | Admitting: Hematology & Oncology

## 2014-03-16 ENCOUNTER — Encounter (HOSPITAL_COMMUNITY): Payer: Self-pay | Admitting: Emergency Medicine

## 2014-03-16 VITALS — BP 134/82 | HR 67 | Temp 98.0°F | Resp 14 | Ht 64.0 in | Wt 209.0 lb

## 2014-03-16 DIAGNOSIS — Z85820 Personal history of malignant melanoma of skin: Secondary | ICD-10-CM | POA: Insufficient documentation

## 2014-03-16 DIAGNOSIS — Z8679 Personal history of other diseases of the circulatory system: Secondary | ICD-10-CM | POA: Insufficient documentation

## 2014-03-16 DIAGNOSIS — Z7901 Long term (current) use of anticoagulants: Secondary | ICD-10-CM | POA: Insufficient documentation

## 2014-03-16 DIAGNOSIS — Z86711 Personal history of pulmonary embolism: Secondary | ICD-10-CM | POA: Insufficient documentation

## 2014-03-16 DIAGNOSIS — I2699 Other pulmonary embolism without acute cor pulmonale: Secondary | ICD-10-CM

## 2014-03-16 DIAGNOSIS — D509 Iron deficiency anemia, unspecified: Secondary | ICD-10-CM

## 2014-03-16 DIAGNOSIS — R21 Rash and other nonspecific skin eruption: Secondary | ICD-10-CM | POA: Insufficient documentation

## 2014-03-16 DIAGNOSIS — Z880 Allergy status to penicillin: Secondary | ICD-10-CM | POA: Insufficient documentation

## 2014-03-16 DIAGNOSIS — Z79899 Other long term (current) drug therapy: Secondary | ICD-10-CM | POA: Insufficient documentation

## 2014-03-16 DIAGNOSIS — F329 Major depressive disorder, single episode, unspecified: Secondary | ICD-10-CM | POA: Insufficient documentation

## 2014-03-16 DIAGNOSIS — T4995XA Adverse effect of unspecified topical agent, initial encounter: Secondary | ICD-10-CM | POA: Insufficient documentation

## 2014-03-16 DIAGNOSIS — Z8742 Personal history of other diseases of the female genital tract: Secondary | ICD-10-CM | POA: Insufficient documentation

## 2014-03-16 DIAGNOSIS — Z862 Personal history of diseases of the blood and blood-forming organs and certain disorders involving the immune mechanism: Secondary | ICD-10-CM | POA: Insufficient documentation

## 2014-03-16 DIAGNOSIS — R490 Dysphonia: Secondary | ICD-10-CM | POA: Insufficient documentation

## 2014-03-16 DIAGNOSIS — Z8543 Personal history of malignant neoplasm of ovary: Secondary | ICD-10-CM | POA: Insufficient documentation

## 2014-03-16 DIAGNOSIS — Z86718 Personal history of other venous thrombosis and embolism: Secondary | ICD-10-CM | POA: Insufficient documentation

## 2014-03-16 DIAGNOSIS — F411 Generalized anxiety disorder: Secondary | ICD-10-CM | POA: Insufficient documentation

## 2014-03-16 DIAGNOSIS — Z85828 Personal history of other malignant neoplasm of skin: Secondary | ICD-10-CM | POA: Insufficient documentation

## 2014-03-16 DIAGNOSIS — F3289 Other specified depressive episodes: Secondary | ICD-10-CM | POA: Insufficient documentation

## 2014-03-16 DIAGNOSIS — T7840XA Allergy, unspecified, initial encounter: Secondary | ICD-10-CM

## 2014-03-16 DIAGNOSIS — R0602 Shortness of breath: Secondary | ICD-10-CM | POA: Insufficient documentation

## 2014-03-16 LAB — CBC WITH DIFFERENTIAL (CANCER CENTER ONLY)
BASO#: 0.1 10*3/uL (ref 0.0–0.2)
BASO%: 0.8 % (ref 0.0–2.0)
EOS ABS: 0.2 10*3/uL (ref 0.0–0.5)
EOS%: 2.6 % (ref 0.0–7.0)
HEMATOCRIT: 44 % (ref 34.8–46.6)
HEMOGLOBIN: 15.2 g/dL (ref 11.6–15.9)
LYMPH#: 1.8 10*3/uL (ref 0.9–3.3)
LYMPH%: 27.2 % (ref 14.0–48.0)
MCH: 31.2 pg (ref 26.0–34.0)
MCHC: 34.5 g/dL (ref 32.0–36.0)
MCV: 90 fL (ref 81–101)
MONO#: 0.5 10*3/uL (ref 0.1–0.9)
MONO%: 7 % (ref 0.0–13.0)
NEUT%: 62.4 % (ref 39.6–80.0)
NEUTROS ABS: 4 10*3/uL (ref 1.5–6.5)
Platelets: 275 10*3/uL (ref 145–400)
RBC: 4.87 10*6/uL (ref 3.70–5.32)
RDW: 13.8 % (ref 11.1–15.7)
WBC: 6.5 10*3/uL (ref 3.9–10.0)

## 2014-03-16 LAB — D-DIMER, QUANTITATIVE: D-Dimer, Quant: 0.22 ug/mL-FEU (ref 0.00–0.48)

## 2014-03-16 LAB — BASIC METABOLIC PANEL
BUN: 12 mg/dL (ref 6–23)
CO2: 25 meq/L (ref 19–32)
CREATININE: 0.71 mg/dL (ref 0.50–1.10)
Calcium: 9.5 mg/dL (ref 8.4–10.5)
Chloride: 99 mEq/L (ref 96–112)
GFR calc non Af Amer: >90 mL/min (ref >90)
Glucose, Bld: 107 mg/dL — ABNORMAL HIGH (ref 70–99)
POTASSIUM: 3.6 meq/L — AB (ref 3.7–5.3)
SODIUM: 141 meq/L (ref 137–147)

## 2014-03-16 LAB — IRON AND TIBC CHCC
%SAT: 31 % (ref 21–57)
Iron: 91 ug/dL (ref 41–142)
TIBC: 290 ug/dL (ref 236–444)
UIBC: 199 ug/dL (ref 120–384)

## 2014-03-16 LAB — FERRITIN CHCC: FERRITIN: 78 ng/mL (ref 9–269)

## 2014-03-16 MED ORDER — METHYLPREDNISOLONE SODIUM SUCC 125 MG IJ SOLR
125.0000 mg | Freq: Once | INTRAMUSCULAR | Status: AC
  Administered 2014-03-16: 125 mg via INTRAVENOUS
  Filled 2014-03-16: qty 2

## 2014-03-16 MED ORDER — DIPHENHYDRAMINE HCL 25 MG PO TABS: 50.0000 mg | ORAL_TABLET | Freq: Four times a day (QID) | ORAL | Status: DC | PRN

## 2014-03-16 MED ORDER — EPINEPHRINE 0.3 MG/0.3ML IJ SOAJ: 0.3000 mg | Freq: Once | INTRAMUSCULAR | Status: DC

## 2014-03-16 MED ORDER — EPINEPHRINE 0.3 MG/0.3ML IJ SOAJ
INTRAMUSCULAR | Status: AC
  Administered 2014-03-16: 0.3 mg via INTRAMUSCULAR
  Filled 2014-03-16: qty 0.3

## 2014-03-16 MED ORDER — RANITIDINE HCL 150 MG/10ML PO SYRP
150.0000 mg | ORAL_SOLUTION | Freq: Once | ORAL | Status: AC
  Administered 2014-03-16: 150 mg via ORAL
  Filled 2014-03-16 (×2): qty 10

## 2014-03-16 MED ORDER — SODIUM CHLORIDE 0.9 % IV BOLUS (SEPSIS)
500.0000 mL | Freq: Once | INTRAVENOUS | Status: AC
  Administered 2014-03-16: 500 mL via INTRAVENOUS

## 2014-03-16 MED ORDER — DIPHENHYDRAMINE HCL 50 MG/ML IJ SOLN
25.0000 mg | Freq: Once | INTRAMUSCULAR | Status: AC
  Administered 2014-03-16: 25 mg via INTRAVENOUS
  Filled 2014-03-16: qty 1

## 2014-03-16 MED ORDER — METHYLPREDNISOLONE 4 MG PO KIT: PACK | ORAL | Status: DC

## 2014-03-16 MED ORDER — EPINEPHRINE 0.3 MG/0.3ML IJ SOAJ
0.3000 mg | Freq: Once | INTRAMUSCULAR | Status: AC
  Administered 2014-03-16: 0.3 mg via INTRAMUSCULAR

## 2014-03-16 NOTE — ED Notes (Signed)
Pt voice no longer raspy. Pt denies itchy skin. Vision wnl. No resp distress noted.

## 2014-03-16 NOTE — ED Notes (Signed)
Pt placed on monitor upon return from restroom. Pt placed on 5 lead, blood pressure, pulse ox.

## 2014-03-16 NOTE — ED Notes (Signed)
Pt was able to ambulate to the restroom with only stand by assistance. Pt stated she felt fine while ambulating. Pt denies any shortness of breath did state she felt a little dizzy but thinks it is due to not having anything to eat all day.

## 2014-03-16 NOTE — ED Notes (Signed)
Pt states she is feeling better. Visiting with friend at bedside

## 2014-03-16 NOTE — ED Notes (Signed)
Per pt sts about 30 to 45 minutes ago she started itching, sts some swelling in throat. sts she is feeling like it hard to take a deep breath. sts unsure of what could have caused the reaction.

## 2014-03-16 NOTE — ED Provider Notes (Signed)
CSN: [id number]     Arrival date & time 03/16/14  1036 History   First MD Initiated Contact with Patient 03/16/14 1042     Chief Complaint  Patient presents with  . Allergic Reaction     (Consider location/radiation/quality/duration/timing/severity/associated sxs/prior Treatment) Patient is a 53 y.o. female presenting with allergic reaction and general illness. The history is provided by the patient.  Allergic Reaction Presenting symptoms: itching and rash   Presenting symptoms: no wheezing   Presenting symptoms comment:  Voice change Severity:  Mild Prior allergic episodes:  No prior episodes Context: no animal exposure, no food allergies, no grass and no poison ivy   Relieved by:  Nothing Ineffective treatments:  Antihistamines Illness Location:  Skin and mouth Severity:  Moderate Duration:  1 hour Progression:  Worsening Associated symptoms: rash and shortness of breath   Associated symptoms: no abdominal pain, no chest pain, no congestion, no cough, no fever, no headaches and no wheezing     Past Medical History  Diagnosis Date  . Depression   . History of blood clotting disorder   . Anemia   . Anxiety   . Allergy     seasonal  . Pulmonary embolism   . Melanoma 2009    insitu  . Basal cell cancer   . Squamous cell carcinoma   . Ovarian cancer 1995    left ovary  . Migraine     history of/none in years  . Menorrhagia 02/10/2013   Past Surgical History  Procedure Laterality Date  . Cesarean section  1996, 2000    twins, singleton  . Breast ductal system excision  2009    L intraductal papilloma  . Laparoscopy with ovarian cystectomy  1994    ovarian torsion  . 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 Mohs surgery  2009    Dr Link Snuffer   Family History  Problem Relation Age of Onset  . Cancer Father 88    colon cancer  . Colon cancer Father   . Cancer Paternal Uncle     prostate  . Breast cancer Mother 12   History  Substance Use Topics  . Smoking  status: Never Smoker   . Smokeless tobacco: Never Used     Comment: never used cigarettes  . Alcohol Use: Yes     Comment: wine-occassionally   OB History   Grav Para Term Preterm Abortions TAB SAB Ect Mult Living   2 2 1 1     1 3      Review of Systems  Constitutional: Negative for fever and activity change.  HENT: Positive for voice change. Negative for congestion and facial swelling.   Eyes: Negative for discharge and redness.  Respiratory: Positive for shortness of breath. Negative for cough and wheezing.   Cardiovascular: Negative for chest pain and palpitations.  Gastrointestinal: Negative for abdominal pain and abdominal distention.  Endocrine: Negative for polydipsia and polyuria.  Genitourinary: Negative for dysuria and menstrual problem.  Musculoskeletal: Negative for back pain and joint swelling.  Skin: Positive for itching and rash. Negative for color change and wound.  Neurological: Negative for dizziness, light-headedness and headaches.      Allergies  Penicillins  Home Medications   Prior to Admission medications   Medication Sig Start Date End Date Taking? Authorizing Provider  clonazePAM (KLONOPIN) 1 MG tablet Take 1 mg by mouth as needed for anxiety. Take 1 mg by mouth as directed. Pt takes 1mg  once daily to twice daily  Historical Provider, MD  warfarin (COUMADIN) 7.5 MG tablet Take 7.5 mg by mouth daily. Adjusted at coumadin clinic 7.5mg  q day, except 1/2 tab on Thu/Sat/Sun    Volanda Napoleon, MD   There were no vitals taken for this visit. Physical Exam  Nursing note and vitals reviewed. Constitutional: She is oriented to person, place, and time. She appears well-developed and well-nourished.  Hoarse, high pitched voice, patent airway, no stridor  HENT:  Head: Normocephalic and atraumatic.  Eyes: Conjunctivae and EOM are normal. Right eye exhibits no discharge. Left eye exhibits no discharge.  Cardiovascular: Normal rate and regular rhythm.    Pulmonary/Chest: Effort normal and breath sounds normal. No respiratory distress.  Abdominal: Soft. She exhibits no distension. There is no tenderness. There is no rebound.  Musculoskeletal: Normal range of motion. She exhibits no edema and no tenderness.  Neurological: She is alert and oriented to person, place, and time.  Skin: Skin is warm and dry. Rash (flat erythematous) noted.    ED Course  Procedures (including critical care time) Labs Review Labs Reviewed  BASIC METABOLIC PANEL - Abnormal; Notable for the following:    Potassium 3.6 (*)    Glucose, Bld 107 (*)    All other components within normal limits    Imaging Review No results found.   EKG Interpretation None      MDM   Final diagnoses:  Allergic reaction    53 yo F w/ h/o PE on coumadin here with 40 minutes of rash, voice change, sob. On exam with blotchy erythema on torso, high pitched hoarse voice. No stridor, distress or wheezing. Epi, zantac, solumedrol, benadryl administered. 11:20 AM patient's symptoms improved, voice improved.   1430: patient symptoms still improved. Multiple hours after epinephrine and other drugs. Will d/c w/ epi pen, continued steroids, prn benadryl for itching.      Merrily Pew, MD 03/16/14 [phone]

## 2014-03-16 NOTE — ED Provider Notes (Signed)
I saw and evaluated the patient, reviewed the resident's note and I agree with the findings and plan.   EKG Interpretation None      10:58 AM  Pt is a 53 y.o. F with history of pulmonary embolus on Coumadin who presents emergency department with diffuse itching, swelling of her throat, difficulty breathing and voice changes that started approximately 10:30 AM while in a meeting today. She denies any new soaps, lotions, detergents, medications or foods. She has never had an allergic reaction like this in the past. On exam, patient does have a slightly hoarse voice for her airway is patent and she has normal aeration with no wheezing, no hypoxia, no respiratory distress. She does have some flat erythematous areas on her back or not confluent. No angioedema. Patient has been given epinephrine, Solu-Medrol, Pepcid and Benadryl. Will closely monitor.   11:19 AM  Pt's sx are now completely resolved and her voice is normal.  She is still hemodynamically stable.   12:23 PM  Pt is still hemodynamically stable and asymptomatic. Will obs until 3 PM. Patient agreeable to plan.  1:39 PM  Pt still stable and asymptomatic.  2:31 PM  patient still stable and asymptomatic. We'll discharge home with steroids, EpiPen.  CRITICAL CARE Performed by: Nyra Jabs   Total critical care time: 45 minutes  Critical care time was exclusive of separately billable procedures and treating other patients.  Critical care was necessary to treat or prevent imminent or life-threatening deterioration.  Critical care was time spent personally by me on the following activities: development of treatment plan with patient and/or surrogate as well as nursing, discussions with consultants, evaluation of patient's response to treatment, examination of patient, obtaining history from patient or surrogate, ordering and performing treatments and interventions, ordering and review of laboratory studies, ordering and review of  radiographic studies, pulse oximetry and re-evaluation of patient's condition.   Munson, DO 03/16/14 1432

## 2014-03-17 ENCOUNTER — Encounter: Payer: Self-pay | Admitting: Nurse Practitioner

## 2014-03-17 ENCOUNTER — Telehealth: Payer: Self-pay | Admitting: Pharmacist

## 2014-03-17 ENCOUNTER — Telehealth: Payer: Self-pay | Admitting: Hematology & Oncology

## 2014-03-17 NOTE — Progress Notes (Signed)
Hematology and Oncology Follow Up Visit  Kimberly Monroe [id number] 05-22-1961 52 y.o. 03/17/2014   Principle Diagnosis:   Pulmonary Embolism-diagnosed May 2013  Iron deficiency anemia    Current Therapy:    Coumadin-2 years duration  IV iron as indicated     Interim History:  [name] is back for followup. Last saw her back in August of last year. She's doing a whole lot better. She has a new job. She and her family are generated to go to [place]. Her 2 daughters graduate high school and her going off to college.  She's doing well with Coumadin. Her last CT angiogram done back in September 2013 showed no evidence of pulmonary emboli.  She's had no shortness of breath. She's engaged some weight. She tried exercise a little bit more.  We have not had to give her iron for a while.  She's had no leg swelling. She's had no change in bowel or bladder habits. She had her hysterectomy and May of last year. This really has helped with her iron deficiency.  Medications: Current outpatient prescriptions:warfarin (COUMADIN) 7.5 MG tablet, Take 3.75-7.5 mg by mouth See admin instructions. Take 7.5 mg by mouth on Monday, Tuesday, Wednesday and Friday, take 3.75 mg by mouth on Thursday, Saturday and Sunday, Disp: , Rfl: ;  diphenhydrAMINE (BENADRYL) 25 MG tablet, Take 2 tablets (50 mg total) by mouth every 6 (six) hours as needed for itching., Disp: 20 tablet, Rfl: 0 EPINEPHrine (EPIPEN) 0.3 mg/0.3 mL IJ SOAJ injection, Inject 0.3 mLs (0.3 mg total) into the muscle once., Disp: 1 Device, Rfl: 2;  methylPREDNISolone (MEDROL DOSEPAK) 4 MG tablet, Follow instructions on box, Disp: 21 tablet, Rfl: 0  Allergies:  Allergies  Allergen Reactions  . Penicillins Other (See Comments)    As a child Possibility-patient has never taken it    Past Medical History, Surgical history, Social history, and Family History were reviewed and updated.  Review of Systems: As above  Physical Exam:  height is  5\' 4"  (1.626 m) and weight is 209 lb (94.802 kg). Her oral temperature is 98 F (36.7 C). Her blood pressure is 134/82 and her pulse is 67. Her respiration is 14.   Well-developed and well-nourished white female. Lungs are clear. Cardiac exam regular rate and rhythm. Abdomen is soft. Has good bowel sounds. There is no fluid wave. She is well-healed laparoscopy scars. There is no palpable liver or spleen. Extremities shows no clubbing cyanosis or edema. No venous cords noted in her legs. Skin exam no rashes ecchymosis or petechia. Neurological exam no focal neurological deficits.  Lab Results  Component Value Date   WBC 6.5 03/16/2014   HGB 15.2 03/16/2014   HCT 44.0 03/16/2014   MCV 90 03/16/2014   PLT 275 03/16/2014     Chemistry      Component Value Date/Time   NA 141 03/16/2014 1118   K 3.6* 03/16/2014 1118   CL 99 03/16/2014 1118   CO2 25 03/16/2014 1118   BUN 12 03/16/2014 1118   CREATININE 0.71 03/16/2014 1118      Component Value Date/Time   CALCIUM 9.5 03/16/2014 1118   ALKPHOS 95 09/20/2013 1215   AST 38* 09/20/2013 1215   ALT 62* 09/20/2013 1215   BILITOT 0.5 09/20/2013 1215      Ferritin is 78. Iron saturation is a 31%.  Her d-dimer is 0.22 Impression and Plan: Kimberly Monroe is a 53 year old white female with a pulmonary embolism. This was diagnosed  back in May of a 2013. She transiently positive anti-cardiolipin antibody. We rechecked this and it was negative.  She wants to stay on Coumadin until she gets back from her trip to [place]. I certainly am okay with this.  I feel we can get her onto 2 baby aspirin a day once we get her off Coumadin.  She does not need any IV iron.  I am so happy that she is doing well .  I will see her back in a couple months. When I see her back, that I will talk to her about getting off Coumadin.  Volanda Napoleon, MD 6/12/20156:57 AM

## 2014-03-17 NOTE — Telephone Encounter (Signed)
Called Ms. Garside to follow up on her coumadin. She saw Dr. Marin Olp yesterday on 03/16/14 and it was decided that she would continue coumadin through August until she sees Dr. Marin Olp again. She has a trip planned to [place] and felt more comfortable staying on coumadin until after the trip. After her appointment with Dr. Marin Olp she began having an allergic reaction while in a meeting and presented to the ED where she received treatment and was released after symptoms improved. Also called to set up next INR check as we do not have an INR from this week. Instructed pt to call back to set up next INR check or call in with INR results.   Thank you, Montel Clock, PharmD

## 2014-03-17 NOTE — Telephone Encounter (Signed)
Mailed august schedule °

## 2014-03-20 ENCOUNTER — Encounter: Payer: Self-pay | Admitting: Pharmacist

## 2014-03-20 NOTE — Progress Notes (Signed)
Left VM to have Kimberly Monroe check her INR with home monitoring machine.

## 2014-03-24 LAB — POCT INR: INR: 2.5

## 2014-03-24 LAB — PROTIME-INR

## 2014-03-27 ENCOUNTER — Telehealth: Payer: Self-pay | Admitting: Pharmacist

## 2014-03-27 ENCOUNTER — Ambulatory Visit (INDEPENDENT_AMBULATORY_CARE_PROVIDER_SITE_OTHER): Payer: Self-pay | Admitting: Pharmacist

## 2014-03-27 DIAGNOSIS — I2699 Other pulmonary embolism without acute cor pulmonale: Secondary | ICD-10-CM

## 2014-03-27 NOTE — Progress Notes (Signed)
INR = 2.5 (home monitor) on 03/24/14 per pt She called [facility] again but no result was faxed to [place]. Pt is on Coumadin 7.5 mg daily except 3.75mg  on Sat/Sun/Thurs. Pt will be leaving for [place] on 03/31/14 (late PM) & returns 04/08/14. She will continue Coumadin at same dose until she sees Dr. Marin Olp in August. Planning to check her INR at home 03/31/14 before leaving on her trip.  She will call us w/ the result. NO CHARGE - phone encounter. Kennith Center, Pharm.D., CPP 03/27/2014@4 :11 PM

## 2014-03-30 ENCOUNTER — Encounter: Payer: Self-pay | Admitting: Hematology & Oncology

## 2014-03-30 ENCOUNTER — Ambulatory Visit: Payer: BC Managed Care – PPO | Admitting: Pharmacist

## 2014-03-30 DIAGNOSIS — I2699 Other pulmonary embolism without acute cor pulmonale: Secondary | ICD-10-CM

## 2014-03-30 LAB — POCT INR: INR: 1.7

## 2014-03-30 NOTE — Progress Notes (Signed)
*  Telephone encounter - No charge* INR just below goal today on home INR monitor Pt is doing well with no complaints Spoke to patient over the phone today She is getting ready to leave for her trip tomorrow evening Pt reports no unusual bleeding or bruising  no missed or extra doses No medication or diet changes Pt has been stable Pt will not be checking INR while on vacation for 1 week Plan: Take 7.5 mg tonight (1 whole tablet) then Continue coumadin 3.75mg  on Sun, Thur and Sat except 7.5mg  on MTWF. Recheck INR at home 04/10/14 when Ms. Kathol returns from trip.

## 2014-03-30 NOTE — Patient Instructions (Signed)
INR just below goal Take 7.5 mg tonight (1 whole tablet) then Continue coumadin 3.75mg  on Sun, Thur and Sat except 7.5mg  on MTWF. Recheck INR at home 04/10/14.

## 2014-04-07 ENCOUNTER — Other Ambulatory Visit: Payer: Self-pay | Admitting: Hematology & Oncology

## 2014-04-10 ENCOUNTER — Ambulatory Visit (INDEPENDENT_AMBULATORY_CARE_PROVIDER_SITE_OTHER): Payer: Self-pay | Admitting: Pharmacist

## 2014-04-10 DIAGNOSIS — I2699 Other pulmonary embolism without acute cor pulmonale: Secondary | ICD-10-CM

## 2014-04-10 LAB — POCT INR: INR: 1.8

## 2014-04-10 NOTE — Patient Instructions (Signed)
Continue coumadin 3.75mg  on Sun, Thur and Sat except 7.5mg  on MTWF. Recheck INR at home 04/17/14.

## 2014-04-10 NOTE — Progress Notes (Signed)
Spoke with patient over phone Home INR=1.8 Pt just got back from traveling where she was eating lots of greens and salads. Will keep dose same as before and see how it adjust when she returns to her normal diet  Continue coumadin 3.75mg  on Sun, Thur and Sat except 7.5mg  on MTWF. Recheck INR at home 04/17/14.

## 2014-04-17 ENCOUNTER — Ambulatory Visit: Payer: BC Managed Care – PPO | Admitting: Pharmacist

## 2014-04-17 DIAGNOSIS — I2699 Other pulmonary embolism without acute cor pulmonale: Secondary | ICD-10-CM

## 2014-04-17 LAB — POCT INR: INR: 2.9

## 2014-04-17 NOTE — Patient Instructions (Signed)
INR at goal But has increased quickly this week Take 1/2 tablet tonight (3.75 mg) then Continue coumadin 3.75mg  on Sun, Thur and Sat except 7.5mg  on MTWF. Recheck INR at home 04/24/14.

## 2014-04-17 NOTE — Progress Notes (Signed)
*  Telephone Encounter - No Charge* INR at goal today Pt is doing well with no complaints No unusual bleeding or bruising No missed or extra doses No medication changes Pt has been back at home x 1 week now after trip and is back to normal diet Pt is a little concerned with quick increase in her INR especially since she is scheduled to take 7.5 mg for the next 3 days Will have pt only take 1/2 tab today  Plan:  Take 1/2 tablet tonight (3.75 mg) then Continue coumadin 3.75mg  on Sun, Thur and Sat except 7.5mg  on MTWF. Recheck INR at home 04/24/14.

## 2014-04-24 LAB — POCT INR: INR: 2.2

## 2014-04-24 LAB — PROTIME-INR

## 2014-04-25 ENCOUNTER — Ambulatory Visit: Payer: BC Managed Care – PPO | Admitting: Pharmacist

## 2014-04-25 DIAGNOSIS — I2699 Other pulmonary embolism without acute cor pulmonale: Secondary | ICD-10-CM

## 2014-04-25 NOTE — Patient Instructions (Signed)
Continue coumadin 7.5mg  daily except 3.75mg  on SuThuSat. Recheck INR at home 05/01/14.

## 2014-04-25 NOTE — Progress Notes (Signed)
INR within goal. INR from 04/24/14. INR is per patient report. Didn't receive actual fax. Pt stated this has happened before. Pt took coumadin as instructed. No missed or extra coumadin doses. No changes in diet or medications. No unusual bruising noted. No bleeding noted. No s/s of clotting noted. Continue coumadin 7.5mg  daily except 3.75mg  on SuThuSat. Recheck INR at home 05/01/14. No charge encounter - spoke to patient by phone.

## 2014-04-28 ENCOUNTER — Encounter: Payer: Self-pay | Admitting: Hematology & Oncology

## 2014-05-01 ENCOUNTER — Telehealth: Payer: Self-pay | Admitting: Pharmacist

## 2014-05-01 NOTE — Telephone Encounter (Signed)
Spoke with Kimberly Monroe.  She has not drawn her INR yet today because she is at work.   She will draw it this evening and we should get results tomorrow.

## 2014-05-02 ENCOUNTER — Telehealth: Payer: Self-pay | Admitting: Pharmacist

## 2014-05-02 LAB — PROTIME-INR

## 2014-05-02 NOTE — Telephone Encounter (Signed)
I s/w pt by phone today & she has had problems w/ her machine & it would not work for her last night when she tried to check her INR. She will try again tonight.  We will touch base w/ pt once we get another INR result faxed. Kennith Center, Pharm.D., CPP 05/02/2014@4 :17 PM

## 2014-05-03 ENCOUNTER — Telehealth: Payer: Self-pay | Admitting: Pharmacist

## 2014-05-03 ENCOUNTER — Ambulatory Visit (INDEPENDENT_AMBULATORY_CARE_PROVIDER_SITE_OTHER): Payer: BC Managed Care – PPO | Admitting: Pharmacist

## 2014-05-03 DIAGNOSIS — I2699 Other pulmonary embolism without acute cor pulmonale: Secondary | ICD-10-CM

## 2014-05-03 LAB — POCT INR: INR: 1.7

## 2014-05-03 NOTE — Telephone Encounter (Signed)
lvm for patient to call back regarding home INR result called in at 1.7 this AM

## 2014-05-03 NOTE — Progress Notes (Signed)
Pt test her INR at home Today was 1.7 Spoke with patient over the phone and she has been eating spinach daily and plans to continue as well as add more greens She is aware this will decrease her INR, so we plan to increase her dose accordingly Will only take 1/2 tablet on weekends and take whole (7.5 mg) tablet each weekday.  Test with home machine next Monday and we will adjust accordingly.

## 2014-05-03 NOTE — Patient Instructions (Signed)
Increase coumadin 7.5mg  daily except 3.75mg  on Sun and Sat. Recheck INR at home 05/08/14.

## 2014-05-04 ENCOUNTER — Encounter: Payer: Self-pay | Admitting: Hematology & Oncology

## 2014-05-09 ENCOUNTER — Ambulatory Visit (INDEPENDENT_AMBULATORY_CARE_PROVIDER_SITE_OTHER): Payer: Self-pay | Admitting: Pharmacist

## 2014-05-09 DIAGNOSIS — I2699 Other pulmonary embolism without acute cor pulmonale: Secondary | ICD-10-CM

## 2014-05-09 LAB — PROTIME-INR

## 2014-05-09 LAB — POCT INR: INR: 1.5

## 2014-05-09 NOTE — Progress Notes (Signed)
*  Telephone Encounter - No Charge* INR below goal today Pt is doing well with no complaints No unusual bleeding or bruising No missed or extra doses Patient just recently returned from [place] reports eating much more spinach, greens, and vegetables this week which has likely caused her INR to decrease Patient plans to see Dr. Marin Olp next week on 05/18/14. At this time anticoagulation plans will be discussed and coumadin therapy may be completed No other changes to report Pt states she does not plan to continue eating as much greens this week  Plan: Take an extra half tablet tonight (11.25 mg total) then continue coumadin 7.5mg  daily except 3.75mg  on Sun and Sat. Recheck INR at home 05/15/14. Followed by Dr. Marin Olp appointment on 8/13

## 2014-05-09 NOTE — Patient Instructions (Signed)
INR below goal due to diet changes this past week Take an extra half tablet tonight (11.25 mg total) then continue coumadin 7.5mg  daily except 3.75mg  on Sun and Sat. Recheck INR at home 05/15/14. Followed by Dr. Marin Olp appointment on 8/13

## 2014-05-10 ENCOUNTER — Encounter: Payer: Self-pay | Admitting: Hematology & Oncology

## 2014-05-15 NOTE — Telephone Encounter (Signed)
Phone call - encounter closed. 

## 2014-05-16 ENCOUNTER — Telehealth: Payer: Self-pay | Admitting: Pharmacist

## 2014-05-16 NOTE — Telephone Encounter (Signed)
Spoke with patient. Reminded her to check her INR. She stated she would do it tonight when she arrived home.

## 2014-05-18 ENCOUNTER — Other Ambulatory Visit (HOSPITAL_BASED_OUTPATIENT_CLINIC_OR_DEPARTMENT_OTHER): Payer: BC Managed Care – PPO | Admitting: Lab

## 2014-05-18 ENCOUNTER — Encounter: Payer: Self-pay | Admitting: Hematology & Oncology

## 2014-05-18 ENCOUNTER — Ambulatory Visit (HOSPITAL_BASED_OUTPATIENT_CLINIC_OR_DEPARTMENT_OTHER): Payer: BC Managed Care – PPO | Admitting: Family

## 2014-05-18 VITALS — BP 141/91 | HR 54 | Temp 97.2°F | Resp 14 | Ht 65.0 in | Wt 212.0 lb

## 2014-05-18 DIAGNOSIS — I2699 Other pulmonary embolism without acute cor pulmonale: Secondary | ICD-10-CM

## 2014-05-18 DIAGNOSIS — D509 Iron deficiency anemia, unspecified: Secondary | ICD-10-CM

## 2014-05-18 LAB — CBC WITH DIFFERENTIAL (CANCER CENTER ONLY)
BASO#: 0.1 10*3/uL (ref 0.0–0.2)
BASO%: 0.8 % (ref 0.0–2.0)
EOS ABS: 0.2 10*3/uL (ref 0.0–0.5)
EOS%: 2.5 % (ref 0.0–7.0)
HCT: 43.3 % (ref 34.8–46.6)
HGB: 14.9 g/dL (ref 11.6–15.9)
LYMPH#: 1.9 10*3/uL (ref 0.9–3.3)
LYMPH%: 29.7 % (ref 14.0–48.0)
MCH: 30.9 pg (ref 26.0–34.0)
MCHC: 34.4 g/dL (ref 32.0–36.0)
MCV: 90 fL (ref 81–101)
MONO#: 0.5 10*3/uL (ref 0.1–0.9)
MONO%: 7.8 % (ref 0.0–13.0)
NEUT#: 3.8 10*3/uL (ref 1.5–6.5)
NEUT%: 59.2 % (ref 39.6–80.0)
PLATELETS: 280 10*3/uL (ref 145–400)
RBC: 4.82 10*6/uL (ref 3.70–5.32)
RDW: 13.8 % (ref 11.1–15.7)
WBC: 6.4 10*3/uL (ref 3.9–10.0)

## 2014-05-18 LAB — IRON AND TIBC
%SAT: 35 % (ref 20–55)
Iron: 114 ug/dL (ref 42–145)
TIBC: 323 ug/dL (ref 250–470)
UIBC: 209 ug/dL (ref 125–400)

## 2014-05-18 LAB — PROTIME-INR (CHCC SATELLITE)
INR: 1.7 — ABNORMAL LOW (ref 2.0–3.5)
Protime: 20.4 Seconds — ABNORMAL HIGH (ref 10.6–13.4)

## 2014-05-18 LAB — D-DIMER, QUANTITATIVE (NOT AT ARMC): D DIMER QUANT: 0.27 ug{FEU}/mL (ref 0.00–0.48)

## 2014-05-18 LAB — FERRITIN: Ferritin: 89 ng/mL (ref 10–291)

## 2014-05-18 NOTE — Progress Notes (Signed)
[facility]  [phone] [phone]  ID: Kimberly Monroe OB: 1961-01-04 MR#: [id number] ZES#:[id number] Patient Care Team: Eulas Post, MD as PCP - General (Family Medicine) Volanda Napoleon, MD as Attending Physician (Hematology and Oncology)  DIAGNOSIS:  Pulmonary Embolism-diagnosed May 2013  Iron deficiency anemia  INTERVAL HISTORY: Kimberly Monroe is back for follow-up. She is doing well and had a wonderful trip to [place] to visit her family. She has good energy. She denies fever, chills, n/v, headache, dizziness, SOB, chest pain, palpitations, abdominal pain, blood in urine or stool. She is trying to healthier and exercise to lose weight. Her INR today is 1.7. She feels that this is because she has been eating a lot of leafy greens but has since cut back. She is concerned with stopping the Coumadin and would prefer to stay on it. She has done well with is. She has a machine at home and checks her levels herself each week. Her last CT angiogram done back in September 2013 showed no evidence of pulmonary emboli. She denies swelling, tenderness, numbness or tingling in her extremities. She has had no bleeding. She had her hysterectomy in May of 2014. This really has helped with her iron deficiency. She has not had to have iron in quite some time. Overall, she is doing very well.  CURRENT TREATMENT: Coumadin-2 years duration  IV iron as indicated  REVIEW OF SYSTEMS: All other 10 point review of systems is negative.   PAST MEDICAL HISTORY: Past Medical History  Diagnosis Date  . Depression   . History of blood clotting disorder   . Anemia   . Anxiety   . Allergy     seasonal  . Pulmonary embolism   . Melanoma 2009    insitu  . Basal cell cancer   . Squamous cell carcinoma   . Ovarian cancer 1995    left ovary  . Migraine     history of/none in years  . Menorrhagia 02/10/2013   PAST SURGICAL HISTORY: Past Surgical History  Procedure Laterality Date   . Cesarean section  1996, 2000    twins, singleton  . Breast ductal system excision  2009    L intraductal papilloma  . Laparoscopy with ovarian cystectomy  1994    ovarian torsion  . 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 Mohs surgery  2009    Dr Link Snuffer   FAMILY HISTORY Family History  Problem Relation Age of Onset  . Cancer Father 67    colon cancer  . Colon cancer Father   . Cancer Paternal Uncle     prostate  . Breast cancer Mother 38   GYNECOLOGIC HISTORY:  No LMP recorded.   SOCIAL HISTORY:  History   Social History  . Marital Status: Married    Spouse Name: N/A    Number of Children: N/A  . Years of Education: N/A   Occupational History  . Not on file.   Social History Main Topics  . Smoking status: Never Smoker   . Smokeless tobacco: Never Used     Comment: never used cigarettes  . Alcohol Use: Yes     Comment: wine-occassionally  . Drug Use: No  . Sexual Activity: Not on file   Other Topics Concern  . Not on file   Social History Narrative  . No narrative on file   ADVANCED DIRECTIVES: <no information>  HEALTH MAINTENANCE: History  Substance Use Topics  . Smoking status:  Never Smoker   . Smokeless tobacco: Never Used     Comment: never used cigarettes  . Alcohol Use: Yes     Comment: wine-occassionally   Colonoscopy: PAP: Bone density: Lipid panel:  Allergies  Allergen Reactions  . Lime Flavor [Flavoring Agent] Anaphylaxis  . Penicillins Other (See Comments)    As a child Possibility-patient has never taken it   Current Outpatient Prescriptions  Medication Sig Dispense Refill  . EPINEPHrine (EPIPEN) 0.3 mg/0.3 mL IJ SOAJ injection Inject 0.3 mLs (0.3 mg total) into the muscle once.  1 Device  2  . warfarin (COUMADIN) 7.5 MG tablet TAKE 1 TABLET BY MOUTH EVERY DAY  AS DIRECTED       No current facility-administered medications for this visit.   OBJECTIVE: Filed Vitals:   05/18/14 0917  BP: 141/91  Pulse: 54  Temp: 97.2  F (36.2 C)  Resp: 14   Body mass index is 35.28 kg/(m^2). ECOG FS:0 - Asymptomatic Ocular: Sclerae unicteric, pupils equal, round and reactive to light Ear-nose-throat: Oropharynx clear, dentition fair Lymphatic: No cervical or supraclavicular adenopathy Lungs no rales or rhonchi, good excursion bilaterally Heart regular rate and rhythm, no murmur appreciated Abd soft, nontender, positive bowel sounds MSK no focal spinal tenderness, no joint edema Neuro: non-focal, well-oriented, appropriate affect Breasts: Deferred  LAB RESULTS: CMP     Component Value Date/Time   NA 141 03/16/2014 1118   K 3.6* 03/16/2014 1118   CL 99 03/16/2014 1118   CO2 25 03/16/2014 1118   GLUCOSE 107* 03/16/2014 1118   BUN 12 03/16/2014 1118   CREATININE 0.71 03/16/2014 1118   CALCIUM 9.5 03/16/2014 1118   PROT 7.4 09/20/2013 1215   ALBUMIN 4.0 09/20/2013 1215   AST 38* 09/20/2013 1215   ALT 62* 09/20/2013 1215   ALKPHOS 95 09/20/2013 1215   BILITOT 0.5 09/20/2013 1215   GFRNONAA >90 03/16/2014 1118   GFRAA >90 03/16/2014 1118   No results found for this basename: SPEP, UPEP,  kappa and lambda light chains   Lab Results  Component Value Date   WBC 6.4 05/18/2014   NEUTROABS 3.8 05/18/2014   HGB 14.9 05/18/2014   HCT 43.3 05/18/2014   MCV 90 05/18/2014   PLT 280 05/18/2014   No results found for this basename: LABCA2   No components found with this basename: LABCA125    Recent Labs Lab 05/18/14 0756  INR 1.7*   STUDIES: No results found.  ASSESSMENT/PLAN: Kimberly Monroe is a very pleasant 53 year old white female with history of pulmonary embolism and iron deficiency anemia. This was diagnosed with the PE back in May of a 2013. She transiently positive anti-cardiolipin antibody. We rechecked this and it was negative.  She prefes to stay on Coumadin rather than switching to Asprin. This is certainly fine.  She does not need any IV iron at this time.  We will see her back in 3 months for labs and  follow-up.  She is in agreement with this and knows to call here with any questions or concerns. We can certainly see her sooner if need be.   Eliezer Bottom, NP 05/18/2014 9:58 AM

## 2014-05-28 LAB — POCT INR: INR: 2

## 2014-05-29 ENCOUNTER — Encounter: Payer: Self-pay | Admitting: *Deleted

## 2014-05-29 ENCOUNTER — Ambulatory Visit: Payer: Self-pay | Admitting: Pharmacist

## 2014-05-29 DIAGNOSIS — I2699 Other pulmonary embolism without acute cor pulmonale: Secondary | ICD-10-CM

## 2014-05-29 NOTE — Progress Notes (Signed)
Received call from [facility] from Patient Experience after she received a phone call from patient with concerns that she received a survey assuming she was a cancer patient and was concerned because she may not have been aware that Dr. Marin Olp was in the cancer as an oncologist as well as a hematologist.  Called patient myself.  She states there was a total misunderstanding.  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  Patient states that in [place] she has a diagnosis of Heart Failure and multiple med errors.  She called trying to resolve this and better the experience for other patients.  She also stated that the survey asked the question "What cancer are you being treated for?" She was concerned for patients who were hematology patients that this question would be upsetting to them.  She understands clearly she doesn't have cancer and that Dr. Marin Olp is an oncologist and hematologist.  Patient wanted me to understand she wasn't complaining and loves our office but was just trying to help with Quality control issues related to MyChart and the survey.  I assured her I would share this conversation with my Midwife.

## 2014-05-29 NOTE — Progress Notes (Signed)
INR = 2 per pt; drawn at home using monitor on 05/28/14.  I did not receive her faxed report from the INR monitoring company. She continues on Coumadin 7.5 mg/day except 3.75 mg on Sat/Sun. No reported problems. INR at goal so she'll stay on same dose of Coumadin & will recheck INR next week at home. NO CHARGE - phone encounter Kennith Center, Pharm.D., CPP 05/29/2014@1 :48 PM

## 2014-06-06 ENCOUNTER — Encounter: Payer: Self-pay | Admitting: Hematology & Oncology

## 2014-06-12 LAB — POCT INR: INR: 2

## 2014-06-13 ENCOUNTER — Ambulatory Visit (INDEPENDENT_AMBULATORY_CARE_PROVIDER_SITE_OTHER): Payer: BC Managed Care – PPO | Admitting: Pharmacist

## 2014-06-13 DIAGNOSIS — I2699 Other pulmonary embolism without acute cor pulmonale: Secondary | ICD-10-CM

## 2014-06-13 NOTE — Progress Notes (Signed)
I s/w pt over phone today to discuss her INR result from 8/29 (= 2.2) and she reported she drew her INR yesterday (9/7) at home and it was 2. She continues on 7.5 mg daily except 3.75 on Sat/Sun. No new complaints or concerns. No change necessary to Coumadin dose. She will repeat her INR on Mon 06/19/14 at home. NO CHARGE - phone encounter. Kennith Center, Pharm.D., CPP 06/13/2014@4 :19 PM

## 2014-06-20 ENCOUNTER — Ambulatory Visit (INDEPENDENT_AMBULATORY_CARE_PROVIDER_SITE_OTHER): Payer: BC Managed Care – PPO | Admitting: Pharmacist

## 2014-06-20 DIAGNOSIS — I2699 Other pulmonary embolism without acute cor pulmonale: Secondary | ICD-10-CM

## 2014-06-20 LAB — POCT INR: INR: 2.1

## 2014-06-20 NOTE — Progress Notes (Signed)
**  Telephone encounter no charge**  INR at goal.  No changes in meds.  No bleeding/bruising.  Will continue Coumadin 3.75mg  S/Sun and 7.5mg  other days.  Ms Kimberly Monroe checks her PT/INR weekly.  I told her that we would call her in 2 weeks if INR remains stable instead of weekly.  She agrees with plan.

## 2014-07-03 ENCOUNTER — Telehealth: Payer: Self-pay | Admitting: Pharmacist

## 2014-07-03 NOTE — Telephone Encounter (Signed)
I s/w pt over phone to see if she had checked her INR recently.  She did check her INR on Sat (9/26) & it was 1.3 (again, I have no report from [facility] to confirm).  She wasn't "feeling quite right".  Upon further explanation, pt states she was "achy" and had "no reason to think it was related to the INR." No s/sxs of VTE. She had a lot of spinach last week, likely cause of low INR. Pt increased her Coumadin to 7.5 mg on Sat & Sun over this weekend herself.  She will go back to her usual dose of 7.5 mg/day except 3.75 mg on Sat/Sun at this point. She does not plan to have more spinach right now. She will recheck her INR next Monday (10/5) & we will call her by phone then. Kennith Center, Pharm.D., CPP 07/03/2014@3 :57 PM

## 2014-07-10 ENCOUNTER — Telehealth: Payer: Self-pay | Admitting: Pharmacist

## 2014-07-10 NOTE — Telephone Encounter (Signed)
Called pt to see if she has checked her INR at home. I left a vm for her to call us back at [facility]. Kennith Center, Pharm.D., CPP 07/10/2014@2 :33 PM

## 2014-07-24 ENCOUNTER — Encounter: Payer: Self-pay | Admitting: Hematology & Oncology

## 2014-07-28 LAB — POCT INR: INR: 1.6

## 2014-07-31 ENCOUNTER — Ambulatory Visit: Payer: BC Managed Care – PPO | Admitting: Pharmacist

## 2014-07-31 DIAGNOSIS — I2699 Other pulmonary embolism without acute cor pulmonale: Secondary | ICD-10-CM

## 2014-07-31 NOTE — Progress Notes (Signed)
**  Telephone encounter.  No charge**  INR =1.6 07/28/14.  Kimberly Monroe from [facility] a home monitoring service called and reported this result this am.  Note: on chart review, an INR result was scanned from 07/22/14=2.  I spoke to Kimberly Monroe on phone.  She states no changes in meds.  No bleeding or bruising.  She has had an increased intake in vegetables over the last week.  Kimberly Monroe had increased her coumadin over the weekend to 7.5mg  daily (usual dose 3.75mg  Sat/Sun and 7.5mg  other days).  Will continue coumadin 7.5mg  daily the rest of the week.  Kimberly Monroe will check PT/INR at home on Friday 08/04/14.  Will f/u results at that time.

## 2014-08-04 ENCOUNTER — Encounter: Payer: Self-pay | Admitting: Hematology & Oncology

## 2014-08-07 ENCOUNTER — Encounter: Payer: Self-pay | Admitting: Hematology & Oncology

## 2014-08-11 ENCOUNTER — Telehealth: Payer: Self-pay | Admitting: Pharmacist

## 2014-08-11 NOTE — Telephone Encounter (Signed)
Called Kimberly Monroe today to see if she had checked her INR at home this week. Kimberly Monroe is planning to check INR on Saturday 11/7 and will call us with her results on Monday 11/9.   Thank you,  Montel Clock, Pharm.D., BCOP

## 2014-08-12 LAB — POCT INR: INR: 2.8

## 2014-08-14 ENCOUNTER — Ambulatory Visit: Payer: BC Managed Care – PPO | Admitting: Pharmacist

## 2014-08-14 ENCOUNTER — Encounter: Payer: Self-pay | Admitting: Hematology & Oncology

## 2014-08-14 DIAGNOSIS — I2699 Other pulmonary embolism without acute cor pulmonale: Secondary | ICD-10-CM

## 2014-08-14 NOTE — Progress Notes (Signed)
INR = 2.8 (drawn by pt at home on 08/12/14) Pt c/o having a cold.  She has been drinking hot tea for her throat/hoarseness. She reports she has taken Coumadin 7.5 mg daily except 3.75 mg on Thurs/Sat/Sun. I will have her continue her current dosing schedule without adjustments. She will recheck her INR on the 14th or 15th.  We can call her on the 16th w/ results/instructions. NO CHARGE - Phone encounter. Kennith Center, Pharm.D., CPP 08/14/2014@3 :40 PM

## 2014-08-17 ENCOUNTER — Telehealth: Payer: Self-pay | Admitting: Hematology & Oncology

## 2014-08-17 ENCOUNTER — Encounter: Payer: Self-pay | Admitting: Hematology & Oncology

## 2014-08-17 ENCOUNTER — Other Ambulatory Visit (HOSPITAL_BASED_OUTPATIENT_CLINIC_OR_DEPARTMENT_OTHER): Payer: BC Managed Care – PPO | Admitting: Lab

## 2014-08-17 ENCOUNTER — Ambulatory Visit (HOSPITAL_BASED_OUTPATIENT_CLINIC_OR_DEPARTMENT_OTHER): Payer: BC Managed Care – PPO | Admitting: Hematology & Oncology

## 2014-08-17 VITALS — BP 154/74 | HR 62 | Temp 98.2°F | Resp 14 | Ht 65.0 in | Wt 214.0 lb

## 2014-08-17 DIAGNOSIS — I2699 Other pulmonary embolism without acute cor pulmonale: Secondary | ICD-10-CM

## 2014-08-17 DIAGNOSIS — D509 Iron deficiency anemia, unspecified: Secondary | ICD-10-CM

## 2014-08-17 DIAGNOSIS — M818 Other osteoporosis without current pathological fracture: Secondary | ICD-10-CM

## 2014-08-17 LAB — CBC WITH DIFFERENTIAL (CANCER CENTER ONLY)
BASO#: 0.1 10*3/uL (ref 0.0–0.2)
BASO%: 0.9 % (ref 0.0–2.0)
EOS%: 2 % (ref 0.0–7.0)
Eosinophils Absolute: 0.2 10*3/uL (ref 0.0–0.5)
HCT: 43.5 % (ref 34.8–46.6)
HGB: 14.8 g/dL (ref 11.6–15.9)
LYMPH#: 2.4 10*3/uL (ref 0.9–3.3)
LYMPH%: 31.1 % (ref 14.0–48.0)
MCH: 30.8 pg (ref 26.0–34.0)
MCHC: 34 g/dL (ref 32.0–36.0)
MCV: 90 fL (ref 81–101)
MONO#: 0.6 10*3/uL (ref 0.1–0.9)
MONO%: 7.6 % (ref 0.0–13.0)
NEUT%: 58.4 % (ref 39.6–80.0)
NEUTROS ABS: 4.5 10*3/uL (ref 1.5–6.5)
PLATELETS: 283 10*3/uL (ref 145–400)
RBC: 4.81 10*6/uL (ref 3.70–5.32)
RDW: 13.6 % (ref 11.1–15.7)
WBC: 7.7 10*3/uL (ref 3.9–10.0)

## 2014-08-17 LAB — IRON AND TIBC CHCC
%SAT: 20 % — ABNORMAL LOW (ref 21–57)
IRON: 54 ug/dL (ref 41–142)
TIBC: 276 ug/dL (ref 236–444)
UIBC: 222 ug/dL (ref 120–384)

## 2014-08-17 LAB — POCT INR: INR: 1.4

## 2014-08-17 LAB — PROTIME-INR (CHCC SATELLITE)
INR: 1.4 — ABNORMAL LOW (ref 2.0–3.5)
Protime: 16.8 Seconds — ABNORMAL HIGH (ref 10.6–13.4)

## 2014-08-17 LAB — D-DIMER, QUANTITATIVE (NOT AT ARMC): D-Dimer, Quant: 0.29 ug/mL-FEU (ref 0.00–0.48)

## 2014-08-17 LAB — FERRITIN CHCC: Ferritin: 106 ng/ml (ref 9–269)

## 2014-08-17 NOTE — Telephone Encounter (Signed)
Mailed 02-2015 schedule

## 2014-08-17 NOTE — Progress Notes (Signed)
Hematology and Oncology Follow Up Visit  Kimberly Monroe [id number] 1960-11-21 53 y.o. 08/17/2014   Principle Diagnosis:   Idiopathic pulmonary embolism  IV iron as indicated  Current Therapy:    Therapeutic Coumadin  IV iron as indicated     Interim History:  [name] is back for follow-up. She is doing quite well. She was last seen in August. So far, she's had no problems with Coumadin. She wants to stay on Coumadin for life. I thought that she really would only benefit from 2 years of Coumadin. However, she just feels more comfortable with Coumadin. She tests her Coumadin level at home. She's working without difficulties. She's had no problems with bleeding. She and her phone family went to [place] this summer. That are really good time. She has her twin daughters in college. A third daughter is in high school. She's had no cough or shortness of breath. Patient had no problems with abdominal pain. She had her hysterectomy for a non-malignant leg condition back in July 2014.  She's had no change in bowel or bladder habits.  She is due for a mammogram. She will set this up herself.  Medications: Current outpatient prescriptions: EPINEPHrine (EPIPEN) 0.3 mg/0.3 mL IJ SOAJ injection, Inject 0.3 mLs (0.3 mg total) into the muscle once., Disp: 1 Device, Rfl: 2;  warfarin (COUMADIN) 7.5 MG tablet, TAKE 1 TABLET BY MOUTH EVERY DAY  AS DIRECTED, Disp: , Rfl:   Allergies:  Allergies  Allergen Reactions  . Lime Flavor [Flavoring Agent] Anaphylaxis  . Penicillins Other (See Comments)    As a child Possibility-patient has never taken it    Past Medical History, Surgical history, Social history, and Family History were reviewed and updated.  Review of Systems: As above  Physical Exam:  height is 5\' 5"  (1.651 m) and weight is 214 lb (97.07 kg). Her oral temperature is 98.2 F (36.8 C). Her blood pressure is 154/74 and her pulse is 62. Her respiration is 14.   Well-developed and  well-nourished white female. Head and neck exam shows no ocular or oral lesions. She has no palpable cervical or supraclavicular lymph nodes. Lungs are clear. Cardiac exam regular rate and rhythm with no murmurs, rubs or bruits. Abdomen is soft. She has good bowel sounds. There is no fluid wave. There is no palpable liver or spleen tip. Back exam shows no tenderness over the spine, ribs or hips. Extremities shows no clubbing, cyanosis or edema. Skin exam shows no suspicious hyperpigmented lesions. She has no ecchymoses or petechia. Neurological exam is nonfocal.  Lab Results  Component Value Date   WBC 7.7 08/17/2014   HGB 14.8 08/17/2014   HCT 43.5 08/17/2014   MCV 90 08/17/2014   PLT 283 08/17/2014     Chemistry      Component Value Date/Time   NA 141 03/16/2014 1118   K 3.6* 03/16/2014 1118   CL 99 03/16/2014 1118   CO2 25 03/16/2014 1118   BUN 12 03/16/2014 1118   CREATININE 0.71 03/16/2014 1118      Component Value Date/Time   CALCIUM 9.5 03/16/2014 1118   ALKPHOS 95 09/20/2013 1215   AST 38* 09/20/2013 1215   ALT 62* 09/20/2013 1215   BILITOT 0.5 09/20/2013 1215         Impression and Plan: Kimberly Monroe is 53 year old white female. She's had a history of a pulmonary embolism. This is idiopathic.again, she likes to be on Coumadin. This gives her a lot of pain's  of mind.  I will thing that her iron studies should be okay. Her MCV is normal. She is not anemic.  I then we can get her back in 6 months now.  She checks her INR at home weekly.  Volanda Napoleon, MD 11/12/20158:46 AM

## 2014-08-21 ENCOUNTER — Ambulatory Visit: Payer: BC Managed Care – PPO | Admitting: Pharmacist

## 2014-08-21 DIAGNOSIS — I2699 Other pulmonary embolism without acute cor pulmonale: Secondary | ICD-10-CM

## 2014-08-21 NOTE — Progress Notes (Signed)
INR below goal from 08/17/14 *Telephone encounter - No charge* Pt is doing well today Pt has not drawn her INR this week as she just had INR at Dr. Marin Olp visit on 11/12 INR was 1.4 Pt unsure why INR low No missed or extra doses No medication or diet change No unusual bleeding or bruising Pt states she took a whole tablet (7.5 mg) on Saturday and Sunday. Plan: Take 7.5 mg over the weekend then Continue Coumadin 7.5mg  daily except 3.75 mg on Thurs/Sat/Sun. Recheck INR at home weekly. Pt will recheck INR this week and will call us with her results

## 2014-08-21 NOTE — Patient Instructions (Signed)
INR below goal Take 7.5 mg over the weekend then Continue Coumadin 7.5mg  daily except 3.75 mg on Thurs/Sat/Sun. Recheck INR at home weekly.

## 2014-08-24 ENCOUNTER — Other Ambulatory Visit: Payer: Self-pay | Admitting: Hematology & Oncology

## 2014-08-27 LAB — POCT INR: INR: 1.6

## 2014-08-27 LAB — PROTIME-INR

## 2014-08-30 ENCOUNTER — Ambulatory Visit: Payer: BC Managed Care – PPO | Admitting: Pharmacist

## 2014-08-30 DIAGNOSIS — I2699 Other pulmonary embolism without acute cor pulmonale: Secondary | ICD-10-CM

## 2014-08-30 NOTE — Patient Instructions (Signed)
INR from home monitor on 08/27/14.  Take 11.25mg  today.  Take 7.5mg  on 08/31/14.  On 09/01/14, resume 7.5mg  daily except 3.75mg  on ThuSatSun. Recheck INR with home monitor on 09/04/14.

## 2014-08-30 NOTE — Progress Notes (Signed)
INR slightly below goal. INR from 08/27/14. No changes in diet or medications. No missed or extra coumadin doses. No problems or concerns regarding anticoagulation. No unusual bruising. No bleeding noted. No s/s of clotting noted. Take 11.25mg  today.  Take 7.5mg  on 08/31/14.  On 09/01/14, resume 7.5mg  daily except 3.75mg  on ThuSatSun. Recheck INR with home monitor on 09/04/14. Spoke with patient by telephone. No charge encounter.

## 2014-09-01 ENCOUNTER — Encounter: Payer: Self-pay | Admitting: Hematology & Oncology

## 2014-09-01 LAB — PROTIME-INR

## 2014-09-01 LAB — POCT INR: INR: 2.4

## 2014-09-04 ENCOUNTER — Ambulatory Visit: Payer: BC Managed Care – PPO | Admitting: Pharmacist

## 2014-09-04 DIAGNOSIS — I2699 Other pulmonary embolism without acute cor pulmonale: Secondary | ICD-10-CM

## 2014-09-04 NOTE — Progress Notes (Signed)
INR = 2.4 on 09/01/14 using home monitor. Pt back on 7.5 mg daily except 3.75 mg on Thurs/Sat/Sun. She did take additional 11.25 mg last week x 1 day due to low INR. Pt states she has been keeping her normal routine except she ate a lot of spinach on Thanksgiving. She opened a new batch of INR test strips on 09/01/14. Her INR has been low a couple of times before this.  I am going to have her increase her Coumadin to 7.5 mg daily except 3.75 mg on Sat/Sun. She plans to recheck her INR this Friday at home. PHONE Pierce, Rutledge.D., CPP 09/04/2014@11 :07 AM

## 2014-09-08 ENCOUNTER — Telehealth: Payer: Self-pay

## 2014-09-08 NOTE — Telephone Encounter (Signed)
Called Ms. Winker to see if she had checked her INR today. She reported that she did not as she has been busy working 12 hour days and did not have time to do so, but she will plan on checking it on Monday, 12/7. We will call her then for the result and to determine appropriate dosage adjustments as necessary.

## 2014-09-15 ENCOUNTER — Encounter: Payer: Self-pay | Admitting: Hematology & Oncology

## 2014-09-15 ENCOUNTER — Ambulatory Visit (HOSPITAL_BASED_OUTPATIENT_CLINIC_OR_DEPARTMENT_OTHER): Payer: Self-pay | Admitting: Pharmacist

## 2014-09-15 DIAGNOSIS — I2699 Other pulmonary embolism without acute cor pulmonale: Secondary | ICD-10-CM

## 2014-09-15 LAB — PROTIME-INR

## 2014-09-15 LAB — POCT INR: INR: 1.3

## 2014-09-15 NOTE — Progress Notes (Signed)
INR = 1.3 using home monitor today. Pt eating a lot of greens. No missed doses of Coumadin. No new meds or OTC's. Asymptomatic for VTE's. INR low today without clear cause. Take 7.5 mg/day this week. Repeat INR at home next Fri. NO CHARGE - phone encounter. Kennith Center, Pharm.D., CPP 09/15/2014@4 :58 PM

## 2014-09-22 ENCOUNTER — Telehealth: Payer: Self-pay | Admitting: Pharmacist

## 2014-09-22 NOTE — Telephone Encounter (Addendum)
INR results not received for this week. I LVM for patient to check INR at home and call in with results.    Thank you,  Montel Clock, PharmD, BCOP

## 2014-09-25 ENCOUNTER — Ambulatory Visit: Payer: BC Managed Care – PPO | Admitting: Pharmacist

## 2014-09-25 ENCOUNTER — Encounter: Payer: Self-pay | Admitting: Hematology & Oncology

## 2014-09-25 DIAGNOSIS — I2699 Other pulmonary embolism without acute cor pulmonale: Secondary | ICD-10-CM

## 2014-09-25 LAB — POCT INR: INR: 3.1

## 2014-09-25 NOTE — Progress Notes (Signed)
**  NO charge-telephone encounter**  INR=3.1 by home monitor.  I spoke to Kimberly Monroe on phone.  No changes in meds.  No bleeding or bruising.  She states that she ate less greens over the past week.  Will continue coumadin 7.5mg  daily (increased dose from 3.75mg  on weekend and 7.5mg  other days) and check PT/INR next week via home monitor.

## 2014-10-02 ENCOUNTER — Ambulatory Visit (INDEPENDENT_AMBULATORY_CARE_PROVIDER_SITE_OTHER): Payer: BC Managed Care – PPO | Admitting: Pharmacist

## 2014-10-02 ENCOUNTER — Telehealth: Payer: Self-pay | Admitting: Pharmacist

## 2014-10-02 DIAGNOSIS — I2699 Other pulmonary embolism without acute cor pulmonale: Secondary | ICD-10-CM

## 2014-10-02 LAB — POCT INR: INR: 1.5

## 2014-10-02 NOTE — Telephone Encounter (Signed)
Did not receive fax from Mrs. Sundberg so called to verify INR had been drawn.

## 2014-10-02 NOTE — Progress Notes (Signed)
TELEPHONE ENCOUNTER ONLY - NO CHARGE  INR = 1.5   Goal 2-3 INR below goal range. Mrs. Wisser thinks it is possible she missed a dose of Coumadin over the holidays but is not sure. She had a friend who died this week with a P.E., therefore she is very concerned about her anticoagulation at present. She plans to check her INR this Friday to be sure it is within the goal range. She will take Coumadin 11.25 mg today (1.5 x 7.5 mg tabs), then will resume 7.5 mg daily. She will recheck her INR on Friday 10/06/14. Patient is aware we are not open on Friday, if her INR is concerning she can call the physician on call for the practice. We should receive a fax on Monday 10/09/14 with those INR results.  Theone Murdoch, PharmD

## 2014-10-05 ENCOUNTER — Encounter: Payer: Self-pay | Admitting: Hematology & Oncology

## 2014-10-09 ENCOUNTER — Encounter: Payer: Self-pay | Admitting: Hematology & Oncology

## 2014-10-09 ENCOUNTER — Ambulatory Visit: Payer: BC Managed Care – PPO | Admitting: Pharmacist

## 2014-10-09 DIAGNOSIS — I2699 Other pulmonary embolism without acute cor pulmonale: Secondary | ICD-10-CM

## 2014-10-09 LAB — POCT INR: INR: 1.8

## 2014-10-09 NOTE — Progress Notes (Signed)
PHONE ENCOUNTER - NO CHARGE. INR = 1.8 today; INR = 2 on 10/05/14. Pt had greens for New Year's.  Perhaps this is reason for INR slight decrease. No s/sxs of VTE, rather pt reports her chin bled this weekend when she scratched it.  Minor bleeding. INR a little low today.  She'll change her weekly dose to 7.5 mg daily except 11.25 mg on Mondays. She will recheck her INR at home next Monday.  We will call w/ results & dosing instructions. Kennith Center, Pharm.D., CPP 10/09/2014@9 :33 AM

## 2014-10-16 ENCOUNTER — Encounter: Payer: Self-pay | Admitting: Hematology & Oncology

## 2014-10-21 LAB — POCT INR: INR: 2

## 2014-10-21 LAB — PROTIME-INR

## 2014-10-23 ENCOUNTER — Ambulatory Visit: Payer: Self-pay | Admitting: Pharmacist

## 2014-10-23 ENCOUNTER — Telehealth: Payer: Self-pay | Admitting: Pharmacist

## 2014-10-23 DIAGNOSIS — I2699 Other pulmonary embolism without acute cor pulmonale: Secondary | ICD-10-CM

## 2014-10-23 NOTE — Telephone Encounter (Signed)
No INR results received today.  I called pt & reminded her to check her INR this week (?Tues/Wed). Kennith Center, Pharm.D., CPP 10/23/2014@3 :08 PM

## 2014-10-23 NOTE — Progress Notes (Signed)
PHONE ENCOUNTER - NO CHARGE INR = 2 reported by pt as drawn on Sat 10/21/14 Current Coumadin dose is 7.5 mg daily except 11.25 mg on Mondays. Pt reports she may start topical 5FU soon.  She can't decide.  I doubt there would be any effect on the INR since it would bypass hepatic metabolism. INR at goal.  No change to Coumadin dose. She'll repeat her INR at home next week. Kennith Center, Pharm.D., CPP 10/23/2014@3 :29 PM

## 2014-10-26 ENCOUNTER — Encounter: Payer: Self-pay | Admitting: Hematology & Oncology

## 2014-10-30 ENCOUNTER — Ambulatory Visit (INDEPENDENT_AMBULATORY_CARE_PROVIDER_SITE_OTHER): Payer: Self-pay | Admitting: Pharmacist

## 2014-10-30 DIAGNOSIS — I2699 Other pulmonary embolism without acute cor pulmonale: Secondary | ICD-10-CM

## 2014-10-30 LAB — POCT INR: INR: 1.5

## 2014-10-30 NOTE — Patient Instructions (Signed)
Take 11.25mg  today (10/30/14) and tomorrow (10/31/14).   On 11/01/14, resume 7.5mg  daily except 11.25mg  on Mondays.  Recheck INR in 1 week with home monitor.

## 2014-10-30 NOTE — Progress Notes (Signed)
INR below goal today. Pt likely missed one coumadin dose over the weekend. No changes in diet or medications. No unusual bruising. No bleeding. No s/s of clotting noted. Plan: Take 11.25mg  today (10/30/14) and tomorrow (10/31/14).  On 11/01/14, resume 7.5mg  daily except 11.25mg  on Mondays.  Recheck INR in 1 week with home monitor. Spoke to patient by telephone. No charge encounter.

## 2014-11-04 LAB — POCT INR: INR: 3.3

## 2014-11-06 ENCOUNTER — Ambulatory Visit: Payer: Self-pay | Admitting: Pharmacist

## 2014-11-06 DIAGNOSIS — I2699 Other pulmonary embolism without acute cor pulmonale: Secondary | ICD-10-CM

## 2014-11-06 NOTE — Progress Notes (Signed)
NO CHARGE - PHONE ENCOUNTER INR = 3.3 drawn 11/04/14 by pt using home monitor. Pt took 11.25 mg on 1/25 & 1/26 as directed for subtherapeutic INR. Pt reports no bleeding/bruising. She takes 7.5 mg daily except 11.25 mg on Mondays but today I've instructed her to just take 7.5 mg today & the rest of the week. She will check her INR next Monday at home & we can call her once we have results. Kennith Center, Pharm.D., CPP 11/06/2014@12 :03 PM

## 2014-11-09 ENCOUNTER — Encounter: Payer: Self-pay | Admitting: Hematology & Oncology

## 2014-11-12 LAB — PROTIME-INR

## 2014-11-13 ENCOUNTER — Encounter: Payer: Self-pay | Admitting: Family Medicine

## 2014-11-13 ENCOUNTER — Ambulatory Visit (INDEPENDENT_AMBULATORY_CARE_PROVIDER_SITE_OTHER): Payer: BLUE CROSS/BLUE SHIELD | Admitting: Family Medicine

## 2014-11-13 ENCOUNTER — Ambulatory Visit: Payer: Self-pay | Admitting: Pharmacist

## 2014-11-13 ENCOUNTER — Other Ambulatory Visit: Payer: Self-pay | Admitting: Family

## 2014-11-13 VITALS — BP 140/74 | HR 74 | Temp 97.5°F | Wt 218.0 lb

## 2014-11-13 DIAGNOSIS — B029 Zoster without complications: Secondary | ICD-10-CM

## 2014-11-13 DIAGNOSIS — I2699 Other pulmonary embolism without acute cor pulmonale: Secondary | ICD-10-CM

## 2014-11-13 LAB — POCT INR: INR: 2.3

## 2014-11-13 MED ORDER — VALACYCLOVIR HCL 1 G PO TABS: 1000.0000 mg | ORAL_TABLET | Freq: Three times a day (TID) | ORAL | Status: DC

## 2014-11-13 NOTE — Patient Instructions (Signed)
INR at goal No changes Continue Take 7.5 mg daily this week. Recheck INR next Monday.  We will call you when we have results.

## 2014-11-13 NOTE — Progress Notes (Signed)
Subjective:    Patient ID: Kimberly Monroe, female    DOB: [birth year], 54 y.o.   MRN: [id number]  HPI   Pt seen today regarding new onset today of red rash immediately under her right breast.  She states feeling fatigue, nausea, and muscle aches for the prior 7 days. Those symptoms have since resolved.  Yesterday, patient noticed a burning pain which radiated from under her right breast around to the medial portion of her T5-T7 area.She stated no rash was present yesterday.  However, during her shower this morning, she noticed a tingling, prickly pain with the new onset of a large red rash under her breast.  She rated pain at 4-5 out of 10.  Patient hasn't attempted to take anything to alleviate the symptoms.   Review of Systems  Constitutional: Negative.        No longer experiencing nausea, fatigue, and muscular aches. Resolved yesterday.  HENT: Negative.   Respiratory: Negative.   Cardiovascular: Negative.   Genitourinary: Negative.   Musculoskeletal: Negative.   Skin:       Complains of tingling or prickly pain under breast with new onset of rash. Describes pain as radiating to her medial region of her back.    Neurological: Negative.        Complains of tingling or prickly pain under breast and rash. Describes pain as radiating to her medial region of her back.   Psychiatric/Behavioral: Negative.       . Past Medical History  Diagnosis Date  . Depression   . History of blood clotting disorder   . Anemia   . Anxiety   . Allergy     seasonal  . Pulmonary embolism   . Melanoma 2009    insitu  . Basal cell cancer   . Squamous cell carcinoma   . Ovarian cancer 1995    left ovary  . Migraine     history of/none in years  . Menorrhagia 02/10/2013   Past Surgical History  Procedure Laterality Date  . Cesarean section  1996, 2000    twins, singleton  . Breast ductal system excision  2009    L intraductal papilloma  . Laparoscopy with ovarian cystectomy  1994    ovarian  torsion  . 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 Mohs surgery  2009    Dr Link Snuffer    reports that she has never smoked. She has never used smokeless tobacco. She reports that she drinks alcohol. She reports that she does not use illicit drugs. family history includes Breast cancer (age of onset: 34) in her mother; Cancer in her paternal uncle; Cancer (age of onset: 67) in her father; Colon cancer in her father. Allergies  Allergen Reactions  . Lime Flavor [Flavoring Agent] Anaphylaxis  . Penicillins Other (See Comments)    As a child Possibility-patient has never taken it   Objective:   Physical Exam  Constitutional: She is oriented to person, place, and time. She appears well-developed and well-nourished.  HENT:  Head: Normocephalic and atraumatic.  Right Ear: External ear normal.  Left Ear: External ear normal.  Nose: Nose normal.  Eyes: Conjunctivae and EOM are normal. Pupils are equal, round, and reactive to light.  Neck: Normal range of motion. Neck supple.  Cardiovascular: Normal rate, regular rhythm and normal heart sounds.   Pulmonary/Chest: Effort normal and breath sounds normal.  Musculoskeletal: Normal range of motion.  Neurological: She is alert and oriented to person, place,  and time.  Skin: Skin is warm. Rash noted. There is erythema.  Macular erythematous patch immediately under right breast and extending to anterior axillary line region.  Psychiatric: She has a normal mood and affect. Her behavior is normal. Thought content normal.          Assessment & Plan:  .Cason was seen today for rash.  Diagnoses and associated orders for this visit:  Shingles rash  Other Orders - valACYclovir (VALTREX) 1000 MG tablet; Take 1 tablet (1,000 mg total) by mouth 3 (three) times daily.   Valtrex -symptoms present less than 24 hours.  Not requiring additional pain medication at this time. Patient to follow-up prn worsening pain or other concern.

## 2014-11-13 NOTE — Progress Notes (Signed)
INR at goal *Telephone Encounter - No Charge* INR via home monitor over the weekend was 2.3 Pt is doing well with no complaints She took 7.5 mg as instructed last week No unusual bleeding or bruising No missed or extra doses No diet or medication changes Pt states she is going to the doctor today for a check up because she thinks she may have shingles Plan: Continue Take 7.5 mg daily this week. Recheck INR next Monday.  We will call you when we have results.

## 2014-11-13 NOTE — Progress Notes (Signed)
Pre visit review using our clinic review tool, if applicable. No additional management support is needed unless otherwise documented below in the visit note. 

## 2014-11-13 NOTE — Patient Instructions (Signed)
Shingles Shingles is caused by the same virus that causes chickenpox. The first feelings may be pain or tingling. A rash will follow in a couple days. The rash may occur on any area of the body. Long-lasting pain is more likely in an elderly person. It can last months to years. There are medicines that can help prevent pain if you start taking them early. HOME CARE   Take cool baths or place cool cloths on the rash as told by your doctor.  Take medicine only as told by your doctor.  Rest as told by your doctor.  Keep your rash clean with mild soap and cool water or as told by your doctor.  Do not scratch your rash. You may use calamine lotion to relieve itchy skin as told by your doctor.  Keep your rash covered with a loose bandage (dressing).  Avoid touching:  Babies.  Pregnant women.  Children with inflamed skin (eczema).  People who have gotten organ transplants.  People with chronic illnesses, such as leukemia or AIDS.  Wear loose-fitting clothing.  If the rash is on the face, you may need to see a specialist. Keep all appointments. Shingles must be kept away from the eyes, if possible.  Keep all follow-up visits as told by your doctor. GET HELP RIGHT AWAY IF:   You have any pain on the face or eye.  You lose feeling on one side of your face.  You have ear pain or ringing in your ear.  You cannot taste as well.  Your medicines do not help the pain.  Your redness or puffiness (swelling) spreads.  You feel like you are getting worse.  You have a fever. MAKE SURE YOU:   Understand these instructions.  Will watch your condition.  Will get help right away if you are not doing well or get worse. Document Released: 03/10/2008 Document Revised: 02/06/2014 Document Reviewed: 03/10/2008 ExitCare Patient Information 2015 [employer]. This information is not intended to replace advice given to you by your health care provider. Make sure you discuss any questions  you have with your health care provider.  

## 2014-11-14 ENCOUNTER — Encounter: Payer: Self-pay | Admitting: Hematology & Oncology

## 2014-11-18 LAB — POCT INR: INR: 2.9

## 2014-11-22 ENCOUNTER — Encounter: Payer: Self-pay | Admitting: Hematology & Oncology

## 2014-11-22 ENCOUNTER — Ambulatory Visit (INDEPENDENT_AMBULATORY_CARE_PROVIDER_SITE_OTHER): Payer: BLUE CROSS/BLUE SHIELD | Admitting: Pharmacist

## 2014-11-22 DIAGNOSIS — I2699 Other pulmonary embolism without acute cor pulmonale: Secondary | ICD-10-CM

## 2014-11-22 NOTE — Progress Notes (Signed)
TELEPHONE ENCOUNTER ONLY - NO CHARGE  INR = 2.9   Goal 2-3 Patient drew INR via home monitor on Saturday 11/18/13. She is unsure why we didn't receive results. She has had no complications of anticoagulation. She will continue Coumadin 7.5 mg daily. She will recheck her INR this weekend and we should receive results early next week.  Theone Murdoch, PharmD

## 2014-11-25 LAB — POCT INR: INR: 2.1

## 2014-11-27 ENCOUNTER — Ambulatory Visit (INDEPENDENT_AMBULATORY_CARE_PROVIDER_SITE_OTHER): Payer: BLUE CROSS/BLUE SHIELD | Admitting: Pharmacist

## 2014-11-27 ENCOUNTER — Encounter: Payer: Self-pay | Admitting: Hematology & Oncology

## 2014-11-27 DIAGNOSIS — I2699 Other pulmonary embolism without acute cor pulmonale: Secondary | ICD-10-CM

## 2014-11-27 NOTE — Progress Notes (Signed)
INR = 2.1 on 11/25/14 (home monitor) Pt on Coumadin 7.5 mg daily. Pt was hit on L side of face yesterday w/ volleyball at her dtrs game.  She has ringing in her ears and was nauseated after the volleyball game.  No nausea today.  Her R eye has a little bit of blurred vision "off & on".  She did have HA but today on phone speaks coherently.  She stayed for the entire volleyball game.  A friend w/ her is an Therapist, sports & pt stated she didn't feel concerned; just kept talking to her to make sure she was ok. She hasn't started the topical 5FU yet. She had shingles L side chest wall (dx 11/13/14) & has completed Valtrex.  She still is "sore." INR at goal.  No change to Coumadin dose.  Recheck INR next week. If pt has worsening sxs, she will go to ER.   NO CHARGE - phone encounter Kennith Center, Pharm.D., CPP 11/27/2014@11 :15 AM

## 2014-12-02 LAB — POCT INR: INR: 2.7

## 2014-12-04 ENCOUNTER — Ambulatory Visit: Payer: BLUE CROSS/BLUE SHIELD | Admitting: Pharmacist

## 2014-12-04 DIAGNOSIS — I2699 Other pulmonary embolism without acute cor pulmonale: Secondary | ICD-10-CM

## 2014-12-04 NOTE — Progress Notes (Signed)
INR = 2.7 (home monitor on 12/02/14) Pt still c/o R eye w/ intermittent blurred vision.  No further ringing in ears. Today she has a fever.  No s/sxs of viral infection.  No one in her home has recently been sick. INR at goal.  She will continue Coumadin 7.5 mg daily this week & call w/ changes. Repeat INR next week at home. NO CHARGE - phone encounter. Kennith Center, Pharm.D., CPP 12/04/2014@3 :49 PM

## 2014-12-11 ENCOUNTER — Telehealth: Payer: Self-pay | Admitting: Pharmacist

## 2014-12-11 NOTE — Telephone Encounter (Signed)
Called to see if patient had checked her INR via the home monitor yet this week. No answer. I left a VM for patient to call us back once she has obtained INR results for this week.  Thank you,   Montel Clock, PharmD, BCOP

## 2014-12-15 ENCOUNTER — Encounter: Payer: Self-pay | Admitting: Hematology & Oncology

## 2014-12-19 ENCOUNTER — Ambulatory Visit: Payer: BLUE CROSS/BLUE SHIELD | Admitting: Pharmacist

## 2014-12-19 ENCOUNTER — Encounter: Payer: Self-pay | Admitting: Nurse Practitioner

## 2014-12-19 DIAGNOSIS — I2699 Other pulmonary embolism without acute cor pulmonale: Secondary | ICD-10-CM

## 2014-12-19 LAB — POCT INR
INR: 1.4
INR: 1.1

## 2014-12-19 NOTE — Progress Notes (Signed)
Pt called stating she had spoken to pharm. And they recommended her to increase her coumadin by a half tab. Patient stated "she was having some tenderness behind her L leg and was just feeling weak". Patient informed that since she stated she had meetings all day and would be standing and/or sitting for long periods of time that she should go home and put her legs up and hydrate.  Per Judson Roch, NP pt was suggested to come into the office to be seen and to also have a doppler to be on the safe side. Pt declined to come into the office and to have U/S. Informed her we could schedule the U/S doppler and call with results. She continued to decline and stated she would just follow pharmacy suggestions, leave work and go home and relax. Judson Roch, NP aware.

## 2014-12-19 NOTE — Progress Notes (Signed)
*  Telephone encounter - No charge* INR below goal today again at 1.1 (pt called back stating she had checked her INR on Sunday and it was 1.4) Today it is 1.1 Unsure why such a low number? Possibly 1 missed dose but pt states she has not missed multiple doses and her diet is unchanged No new or changed meds. Pt is now at home resting Her leg is more comfortable she says. She still does not want to have a doppler done. She is likely low risk due to being on coumadin for ~ 3 years (goal was 2 years of therapy but patient feels more comfortable with life long coumadin) In setting of lower INR than initially reported will adjust dose slightly higher this week Plans: Take 11.25 mg (1.5 tabs) today and tomorrow then Continue 7.5 mg daily this week. If you have worsening sxs in your leg proceed to the ER or [facility] for evaluation  Recheck INR next Monday.  We will call you when we have results. If INR below goal again would at 1 to 2 days of 11.25 mg spread out through the week

## 2014-12-19 NOTE — Patient Instructions (Signed)
INR below goal Take 11.25 mg (1.5 tabs) today and tomorrow then Continue 7.5 mg daily this week. If you have worsening sxs in your leg proceed to the ER or [facility] for evaluation  Recheck INR next Monday.  We will call you when we have results.

## 2014-12-19 NOTE — Patient Instructions (Signed)
INR below goal Take 11.25 mg (1.5 tabs today) then Continue 7.5 mg daily this week. If you have worsening sxs in your leg proceed to the ER or [facility] for evaluation  Recheck INR next Monday.  We will call you when we have results.

## 2014-12-19 NOTE — Progress Notes (Signed)
INR below goal today at 1.4 (Goal 2-3) *telephone encounter - No charge* Pt states she may have missed a dose this past week She reports so minor discomfort in her leg behind her knee No swelling reported, no warmth to touch. Reports just minor discomfort Unlikely for DVT. But patient called [facility] and was recommended to have doppler performed Pt declined and will stay home rest and evaluate  She will let us know if this becomes worse and she will go to the ED if this happens for doppler/US No unusual bleeding or bruising No medication or diet changes Plan: Take 11.25 mg (1.5 tabs today) then Continue 7.5 mg daily this week. If you have worsening sxs in your leg proceed to the ER or [facility] for evaluation  Recheck INR next Monday.  We will call you when we have results.

## 2014-12-20 ENCOUNTER — Encounter: Payer: Self-pay | Admitting: Hematology & Oncology

## 2014-12-24 LAB — POCT INR: INR: 1.9

## 2014-12-26 ENCOUNTER — Ambulatory Visit: Payer: Self-pay | Admitting: Pharmacist

## 2014-12-26 DIAGNOSIS — I2699 Other pulmonary embolism without acute cor pulmonale: Secondary | ICD-10-CM

## 2014-12-26 NOTE — Progress Notes (Signed)
*  Telephone Encounter Only - No Charge* INR 1.9 on 12/24/14       Goal 2-3 INR is slightly below goal range. Patient says the pain she was having in her leg has decreased, it is "nothing to write home about." There is no swelling. Unsure why INR was 1.1 last week after being stable for several months at same dose. No new medications. Patient states she has eaten a few more salads than usual during the warm weather, but this has been minimal. Will increase Coumadin to 7.5 mg daily except 11.25 mg ( 1 1/2 tabs) on Tuesdays. Recheck INR next Monday. We will call her when we have results.  Theone Murdoch, PharmD

## 2014-12-28 ENCOUNTER — Other Ambulatory Visit: Payer: Self-pay | Admitting: Hematology & Oncology

## 2014-12-28 LAB — PROTIME-INR

## 2015-01-02 ENCOUNTER — Ambulatory Visit: Payer: Self-pay | Admitting: Pharmacist

## 2015-01-02 DIAGNOSIS — I2699 Other pulmonary embolism without acute cor pulmonale: Secondary | ICD-10-CM

## 2015-01-02 NOTE — Progress Notes (Unsigned)
Called patient to get home INR results. She had not completed the test yet Will do tonight when she gets home Will need to call once we get faxed results or call patient [phone] and ask about results.

## 2015-01-03 ENCOUNTER — Telehealth: Payer: Self-pay | Admitting: Pharmacist

## 2015-01-03 NOTE — Telephone Encounter (Signed)
We have not received an INR today.   Left VM asking patient to call Coumadin clinic.

## 2015-01-04 ENCOUNTER — Ambulatory Visit: Payer: BLUE CROSS/BLUE SHIELD | Admitting: Pharmacist

## 2015-01-04 ENCOUNTER — Other Ambulatory Visit (HOSPITAL_COMMUNITY)
Admission: RE | Admit: 2015-01-04 | Discharge: 2015-01-04 | Disposition: A | Discharge status: Home / Self Care | Payer: BLUE CROSS/BLUE SHIELD | Source: Other Acute Inpatient Hospital | Attending: Hematology & Oncology | Admitting: Hematology & Oncology

## 2015-01-04 ENCOUNTER — Other Ambulatory Visit: Payer: Self-pay | Admitting: Pharmacist

## 2015-01-04 ENCOUNTER — Other Ambulatory Visit: Payer: Self-pay

## 2015-01-04 ENCOUNTER — Telehealth: Payer: Self-pay | Admitting: Pharmacist

## 2015-01-04 DIAGNOSIS — I2699 Other pulmonary embolism without acute cor pulmonale: Secondary | ICD-10-CM

## 2015-01-04 DIAGNOSIS — Z86711 Personal history of pulmonary embolism: Secondary | ICD-10-CM

## 2015-01-04 DIAGNOSIS — Z7901 Long term (current) use of anticoagulants: Secondary | ICD-10-CM | POA: Diagnosis not present

## 2015-01-04 LAB — PROTIME-INR
INR: 0.98 (ref 0.00–1.49)
Prothrombin Time: 13.1 seconds (ref 11.6–15.2)

## 2015-01-04 LAB — POCT INR: INR: 0.98

## 2015-01-04 NOTE — Progress Notes (Signed)
*  Telephone Encounter Only- No Charge*  INR = 0.98    Goal 2-3 Patient called this morning questioning results of home INR monitor. Lab was drawn today at [facility] with result above. Patient states she has not missed any doses of Coumadin, however her [facility] tablets [facility] do not look the same as usual. I asked her to call us and read [place] the tablet markings when she has them available so that we can be sure what she is taking is warfarin since INR is below baseline. She is also having pain, but no swelling in her calf. Dr. Marin Olp would like her to begin Lovenox 150 mg SQ daily today. If her leg begins to swell, he would like for her to call his office so that they can arrange for her to have a Doppler study. She will come by this afternoon and pick up name brand Coumadin 7.5 mg tablets to use for the rest of this week. Samples given:  Coumadin 7.5 mg #10   Lot: [id number]     Exp:  02/03/16 She will return to [facility] Lab on Monday 01/08/15 and have INR repeated. We will call her with results and instructions.  Theone Murdoch, PharmD

## 2015-01-04 NOTE — Telephone Encounter (Signed)
Patient called. She does not trust her INR home monitoring device, INR today is 1.4 with home monitor. She wants to come in to lab and get this checked. She is working at the [employer] and will go to [facility] to get this checked. Orders entered in [facility] and also faxed to [facility] lab per their request [phone]). She can go to the lab at [facility] as a walk in, she does not need an appt.

## 2015-01-05 ENCOUNTER — Encounter: Payer: Self-pay | Admitting: Hematology & Oncology

## 2015-01-08 ENCOUNTER — Encounter: Payer: Self-pay | Admitting: Hematology & Oncology

## 2015-01-08 ENCOUNTER — Ambulatory Visit: Payer: BLUE CROSS/BLUE SHIELD | Admitting: Pharmacist

## 2015-01-08 DIAGNOSIS — I2699 Other pulmonary embolism without acute cor pulmonale: Secondary | ICD-10-CM

## 2015-01-08 LAB — PROTIME-INR
INR: 1.67 — ABNORMAL HIGH (ref 0.00–1.49)
Prothrombin Time: 19.8 seconds — ABNORMAL HIGH (ref 11.6–15.2)

## 2015-01-08 LAB — POCT INR: INR: 1.67

## 2015-01-08 NOTE — Progress Notes (Signed)
Pt called [place] reporting her INR by home monitor today = 1.9 but was drawn by venopuncture at [facility] earlier today & INR = 1.67 Her INR on 3/31 at home was 1.4 but by venopuncture was 0.98. She has contacted [facility] home monitor service to inform them there is a definite deviation w/ her INR results.  She is awaiting a call back from them & hopefully will have a technician help her determine her machine calibration is off or if the test strips are a bad lot. She is on Lovenox 150 mg SQ daily at present.  She has 2 more inj at home after she takes today's dose. She took Coumadin 11.25 mg on 3/31 as instructed  Pt [facility] also [facility] about the tablet appearance of her Coumadin & whether it was filled incorrectly.  Meanwhile, we provided Coumadin samples for her last week & she is using them. INR low.  Pt will stay on Lovenox until INR = 2-3. She'll take Coumadin 11.25 mg today & tomorrow then go back to previous dose of 7.5 mg/day except 11.25 mg on Tuesday. She works across the street from [employer] prefers to have her lab drawn there so I've faxed order for PT/INR x 1 yr prn to their lab (fax# [phone]).   Pt will go there this Thurs (4/7) for next INR.  If the INR is not at goal, we'll need to make sure to RX more Lovenox. We will call pt once INR resulted in EPIC NO CHARGE - Phone Encounter. Kennith Center, Pharm.D., CPP 01/08/2015@3 :26 PM

## 2015-01-11 ENCOUNTER — Ambulatory Visit: Payer: BLUE CROSS/BLUE SHIELD | Admitting: Pharmacist

## 2015-01-11 ENCOUNTER — Other Ambulatory Visit (HOSPITAL_COMMUNITY)
Admission: RE | Admit: 2015-01-11 | Discharge: 2015-01-11 | Disposition: A | Discharge status: Home / Self Care | Payer: BLUE CROSS/BLUE SHIELD | Source: Ambulatory Visit | Attending: Hematology & Oncology | Admitting: Hematology & Oncology

## 2015-01-11 DIAGNOSIS — Z7901 Long term (current) use of anticoagulants: Secondary | ICD-10-CM | POA: Diagnosis present

## 2015-01-11 DIAGNOSIS — I2699 Other pulmonary embolism without acute cor pulmonale: Secondary | ICD-10-CM

## 2015-01-11 LAB — POCT INR: INR: 2.23

## 2015-01-11 LAB — PROTIME-INR
INR: 2.23 — ABNORMAL HIGH (ref 0.00–1.49)
PROTHROMBIN TIME: 24.9 s — AB (ref 11.6–15.2)

## 2015-01-11 NOTE — Patient Instructions (Signed)
Stop lovenox.  Continue Coumadin 7.5 mg daily except 11.25 mg (1 1/2 tabs) on Tuesdays.  Return to [facility] lab on 01/18/15 for repeat INR (result in EPIC)

## 2015-01-11 NOTE — Progress Notes (Signed)
Pt called to report INR complete at [facility] INR=2.23 with blood draw INR=2.7 with home monitor (using same blood) she took her home machine in with her to compare results  She has no changes to report Instructed to stop lovenox Resume last coumadin dose of 7.5 mg daily with 11.25 mg on Tue  She request INR next Thur to make sure it is staying therapeutic Plans to call home monitoring company to let them know of the discrepancy Will need to f/u with patient on new machine options on 4.14.16  Stop lovenox.   Continue Coumadin 7.5 mg daily except 11.25 mg (1 1/2 tabs) on Tuesdays.   Return to [facility] lab on 01/18/15 for repeat INR (result in EPIC) Faxed order for 4.14.16 INR to [phone]

## 2015-01-18 ENCOUNTER — Telehealth: Payer: Self-pay | Admitting: Pharmacist

## 2015-01-18 NOTE — Telephone Encounter (Signed)
Kimberly Monroe did not have time to get INR drawn today. She plans to have it drawn 4/15 am.

## 2015-01-19 ENCOUNTER — Ambulatory Visit: Payer: BLUE CROSS/BLUE SHIELD | Admitting: Pharmacist

## 2015-01-19 ENCOUNTER — Telehealth: Payer: Self-pay | Admitting: Pharmacist

## 2015-01-19 DIAGNOSIS — I2699 Other pulmonary embolism without acute cor pulmonale: Secondary | ICD-10-CM

## 2015-01-19 LAB — POCT INR: INR: 1.63

## 2015-01-19 NOTE — Progress Notes (Signed)
INR = 1.63; drawn at [facility]. Pt did not check her INR at home since she is awaiting new machine. No complications re: anticoag. This week she was taking Coumadin 7.5 mg daily except 11.25 mg on Tues. I think she now needs 2 days/week of 11.25 mg.  We'll make those days Mon/Wed. She will boost today & tomorrow at 11.25 mg. Pt understands plan.  She'll go back to [id number] 01/24/15 for repeat protime. I re-faxed orders to [facility] lab [phone] - fax#; ph# [phone]) since they did not have the orders from 01/08/15 that I sent before. NO CHARGE - phone encounter. Kennith Center, Pharm.D., CPP 01/19/2015@3 :04 PM

## 2015-01-19 NOTE — Telephone Encounter (Signed)
INR does not appear to have been drawn yet today. Left VM for patient asking her to call.

## 2015-01-24 ENCOUNTER — Telehealth: Payer: Self-pay | Admitting: Pharmacist

## 2015-01-24 NOTE — Telephone Encounter (Signed)
I contacted pt by phone b/c I expected an INR result today.  She said she will go to [facility] tomorrow for lab draw rather than today. We'll call her tomorrow w/ results/dosing instructions when results available in EPIC. Kennith Center, Pharm.D., CPP 01/24/2015@1 :41 PM

## 2015-01-25 ENCOUNTER — Ambulatory Visit: Payer: BLUE CROSS/BLUE SHIELD | Admitting: Pharmacist

## 2015-01-25 ENCOUNTER — Other Ambulatory Visit (HOSPITAL_COMMUNITY)
Admission: RE | Admit: 2015-01-25 | Discharge: 2015-01-25 | Disposition: A | Discharge status: Home / Self Care | Payer: BLUE CROSS/BLUE SHIELD | Source: Ambulatory Visit | Attending: Hematology & Oncology | Admitting: Hematology & Oncology

## 2015-01-25 ENCOUNTER — Telehealth: Payer: Self-pay | Admitting: Pharmacist

## 2015-01-25 DIAGNOSIS — I2699 Other pulmonary embolism without acute cor pulmonale: Secondary | ICD-10-CM

## 2015-01-25 LAB — PROTIME-INR
INR: 2.4 — ABNORMAL HIGH (ref 0.00–1.49)
Prothrombin Time: 26.4 seconds — ABNORMAL HIGH (ref 11.6–15.2)

## 2015-01-25 LAB — POCT INR: INR: 2.4

## 2015-01-25 NOTE — Progress Notes (Signed)
INR = 2.4 (drawn at [facility] this afternoon) on Coumadin 7.5 mg daily except 11.25 mg Mon/Wed. She drew her INR at home today as well and it was also 2.4 She awaits a new INR testing machine.  Meanwhile she will go to [facility] lab for her INR's to be drawn. No complications re: anticoag. INR at goal.  No change to dose. Repeat INR in 2 weeks.   NO CHARGE - phone encounter Kennith Center, Pharm.D., CPP 01/25/2015@3 :04 PM

## 2015-01-26 ENCOUNTER — Telehealth: Payer: Self-pay | Admitting: *Deleted

## 2015-01-26 NOTE — Telephone Encounter (Addendum)
Patient aware  ----- Message from Volanda Napoleon, MD sent at 01/25/2015  4:17 PM EDT ----- Please call and tell her that the INR is perfect. Thanks

## 2015-02-09 ENCOUNTER — Telehealth: Payer: Self-pay | Admitting: Pharmacist

## 2015-02-09 NOTE — Telephone Encounter (Signed)
I s/w pt yesterday and she had been in meetings all morning and didn't get over to [facility] to have her INR drawn. She said she'd go over yesterday afternoon but there are no results in EPIC. I left her a voicemail requesting that she call us to let us know if she, in fact, went to [facility] yesterday.  If she did not, she needs to go have her INR checked there today. She told me yesterday she was asked by the home monitoring company to mail her old testing machine back to the company.  She has not done that yet.  Once they receive the old one, they will mail her out a new one.  Hence, she will have her INR's checked at [facility] until the new machine is delivered. Kennith Center, Pharm.D., CPP 02/09/2015@12 :05 PM

## 2015-02-12 ENCOUNTER — Other Ambulatory Visit (HOSPITAL_COMMUNITY)
Admission: RE | Admit: 2015-02-12 | Discharge: 2015-02-12 | Disposition: A | Discharge status: Home / Self Care | Payer: BLUE CROSS/BLUE SHIELD | Source: Other Acute Inpatient Hospital | Attending: Hematology & Oncology | Admitting: Hematology & Oncology

## 2015-02-12 ENCOUNTER — Ambulatory Visit: Payer: BLUE CROSS/BLUE SHIELD | Admitting: Pharmacist

## 2015-02-12 DIAGNOSIS — Z86711 Personal history of pulmonary embolism: Secondary | ICD-10-CM | POA: Diagnosis present

## 2015-02-12 DIAGNOSIS — I2699 Other pulmonary embolism without acute cor pulmonale: Secondary | ICD-10-CM

## 2015-02-12 LAB — POCT INR: INR: 1.82

## 2015-02-12 LAB — PROTIME-INR
INR: 1.82 — ABNORMAL HIGH (ref 0.00–1.49)
PROTHROMBIN TIME: 21.3 s — AB (ref 11.6–15.2)

## 2015-02-12 NOTE — Progress Notes (Signed)
INR = 1.82 - drawn at [facility] today. Pt is on 7.5 mg/day except 11.25 mg on Mon/Wed. She has no complaints today re: anticoag.  She sounds like she has a head cold. She said she ate a large salad yesterday and it had mixed greens, including spinach. Pt still has not mailed back her old INR monitor so she'll keep going to [facility] for protime checks until her new machine is sent. INR a little low today, likely due to vit K intake increased yesterday. She's previously been therapeutic on same dose of Coumadin.  I will make no change to her Coumadin dose. She'll go back over to [facility] lab on 02/26/15 for another INR check. NO CHARGE - phone encounter Kennith Center, Pharm.D., CPP 02/12/2015@2 :47 PM

## 2015-02-15 ENCOUNTER — Other Ambulatory Visit: Payer: BLUE CROSS/BLUE SHIELD | Admitting: Nurse Practitioner

## 2015-02-15 ENCOUNTER — Ambulatory Visit: Payer: BLUE CROSS/BLUE SHIELD | Admitting: Family

## 2015-03-08 ENCOUNTER — Telehealth: Payer: Self-pay | Admitting: *Deleted

## 2015-03-08 ENCOUNTER — Telehealth: Payer: Self-pay | Admitting: Hematology & Oncology

## 2015-03-08 NOTE — Telephone Encounter (Signed)
Left vm for pt to cb and reschedule lab and doctor visit.

## 2015-03-08 NOTE — Telephone Encounter (Signed)
[facility] has been unable to contact patient for several weeks. She needs to follow up with company regarding sending a machine back.  She had been getting lab draws at [facility], however she has not seen them since May 9th. She was supposed to make an appointment for May 23rd, which she didn't and she was a no-show to our office on May 12th for lab and MD visit.   Called patient and was able to speak with her. She acknowledges her lack of compliance with the Pt/INR but she says she needed to "mentally check out for awhile". She is very frustrated with [facility] but will try to resolve the issue with them. She also states she will call the [facility]in Clinic today to reschedule her lab appointment to have her INR checked.  Message also sent to scheduler to reschedule the no-show appointment in May.

## 2015-03-09 ENCOUNTER — Other Ambulatory Visit (HOSPITAL_COMMUNITY): Admission: RE | Admit: 2015-03-09 | Payer: BLUE CROSS/BLUE SHIELD | Admitting: Hematology & Oncology

## 2015-03-13 ENCOUNTER — Ambulatory Visit (HOSPITAL_BASED_OUTPATIENT_CLINIC_OR_DEPARTMENT_OTHER): Payer: BLUE CROSS/BLUE SHIELD | Admitting: Family

## 2015-03-13 ENCOUNTER — Encounter: Payer: Self-pay | Admitting: Family

## 2015-03-13 ENCOUNTER — Other Ambulatory Visit (HOSPITAL_BASED_OUTPATIENT_CLINIC_OR_DEPARTMENT_OTHER): Payer: BLUE CROSS/BLUE SHIELD

## 2015-03-13 ENCOUNTER — Telehealth: Payer: Self-pay

## 2015-03-13 VITALS — BP 162/92 | HR 70 | Temp 98.0°F | Resp 16 | Ht 65.0 in | Wt 224.0 lb

## 2015-03-13 DIAGNOSIS — D509 Iron deficiency anemia, unspecified: Secondary | ICD-10-CM

## 2015-03-13 DIAGNOSIS — I2699 Other pulmonary embolism without acute cor pulmonale: Secondary | ICD-10-CM

## 2015-03-13 DIAGNOSIS — Z85820 Personal history of malignant melanoma of skin: Secondary | ICD-10-CM | POA: Diagnosis not present

## 2015-03-13 DIAGNOSIS — Z8589 Personal history of malignant neoplasm of other organs and systems: Secondary | ICD-10-CM | POA: Diagnosis not present

## 2015-03-13 DIAGNOSIS — M818 Other osteoporosis without current pathological fracture: Secondary | ICD-10-CM

## 2015-03-13 LAB — CBC WITH DIFFERENTIAL (CANCER CENTER ONLY)
BASO#: 0 10*3/uL (ref 0.0–0.2)
BASO%: 0.5 % (ref 0.0–2.0)
EOS%: 1.6 % (ref 0.0–7.0)
Eosinophils Absolute: 0.1 10*3/uL (ref 0.0–0.5)
HCT: 42.7 % (ref 34.8–46.6)
HGB: 14.9 g/dL (ref 11.6–15.9)
LYMPH#: 2 10*3/uL (ref 0.9–3.3)
LYMPH%: 27.6 % (ref 14.0–48.0)
MCH: 31 pg (ref 26.0–34.0)
MCHC: 34.9 g/dL (ref 32.0–36.0)
MCV: 89 fL (ref 81–101)
MONO#: 0.5 10*3/uL (ref 0.1–0.9)
MONO%: 6.7 % (ref 0.0–13.0)
NEUT#: 4.6 10*3/uL (ref 1.5–6.5)
NEUT%: 63.6 % (ref 39.6–80.0)
PLATELETS: 297 10*3/uL (ref 145–400)
RBC: 4.8 10*6/uL (ref 3.70–5.32)
RDW: 13.6 % (ref 11.1–15.7)
WBC: 7.3 10*3/uL (ref 3.9–10.0)

## 2015-03-13 LAB — COMPREHENSIVE METABOLIC PANEL
ALT: 35 U/L (ref 0–35)
AST: 28 U/L (ref 0–37)
Albumin: 3.8 g/dL (ref 3.5–5.2)
Alkaline Phosphatase: 92 U/L (ref 39–117)
BILIRUBIN TOTAL: 0.4 mg/dL (ref 0.2–1.2)
BUN: 9 mg/dL (ref 6–23)
CALCIUM: 9.2 mg/dL (ref 8.4–10.5)
CO2: 25 meq/L (ref 19–32)
Chloride: 105 mEq/L (ref 96–112)
Creatinine, Ser: 0.58 mg/dL (ref 0.50–1.10)
Glucose, Bld: 106 mg/dL — ABNORMAL HIGH (ref 70–99)
Potassium: 3.7 mEq/L (ref 3.5–5.3)
Sodium: 138 mEq/L (ref 135–145)
Total Protein: 6.5 g/dL (ref 6.0–8.3)

## 2015-03-13 LAB — IRON AND TIBC CHCC
%SAT: 25 % (ref 21–57)
IRON: 71 ug/dL (ref 41–142)
TIBC: 281 ug/dL (ref 236–444)
UIBC: 210 ug/dL (ref 120–384)

## 2015-03-13 LAB — FERRITIN CHCC: Ferritin: 69 ng/ml (ref 9–269)

## 2015-03-13 LAB — PROTIME-INR (CHCC SATELLITE)
INR: 2.6 (ref 2.0–3.5)
PROTIME: 31.2 s — AB (ref 10.6–13.4)

## 2015-03-13 NOTE — Telephone Encounter (Signed)
Left detailed message on cell mail (concerning mammogram)

## 2015-03-13 NOTE — Progress Notes (Signed)
Hematology and Oncology Follow Up Visit  Kimberly Monroe [id number] 03/26/1961 54 y.o. 03/13/2015   Principle Diagnosis:  Idiopathic pulmonary embolism Iron deficiency anemia  Current Therapy:   Therapeutic Coumadin IV iron as indicated    Interim History:  Kimberly Monroe is here today for a follow-up. She is doing well. She is enjoying having her 3 daughters home for the summer. She has chosen to remain on Coumadin lifelong. She has had no episode of bruising or bleeding. Her INR today is 2.6.  She has had no SOB, chest pain, palpitations, abdominal pain, changes in bowel or bladder habits. No blood in urine or stool. She denies fatigue. She has a small "lump" on her left upper arm that is mobile and not painful. It appears to be a cyst but since she has a history of melanoma and squamous cell carcinomas removed she will follow-up with her dermatologist. The area is not red or discolored. She will make a follow-up appointment with them today for evaluation.  No swelling, tenderness, numbness or tingling in her extremities. No new aches or pains.  She is staying active and is still working.  She will be travelling to [place] at the end of summer to help her daughter move.   Medications:    Medication List       This list is accurate as of: 03/13/15 10:19 AM.  Always use your most recent med list.               EPINEPHrine 0.3 mg/0.3 mL Soaj injection  Commonly known as:  EPIPEN  Inject 0.3 mLs (0.3 mg total) into the muscle once.     warfarin 7.5 MG tablet  Commonly known as:  COUMADIN  TAKE 1 TABLET BY MOUTH EVERY DAY        Allergies:  Allergies  Allergen Reactions  . Lime Flavor [Flavoring Agent] Anaphylaxis  . Penicillins Other (See Comments)    As a child Possibility-patient has never taken it    Past Medical History, Surgical history, Social history, and Family History were reviewed and updated.  Review of Systems: All other 10 point review of systems is negative.    Physical Exam:  vitals were not taken for this visit.  Wt Readings from Last 3 Encounters:  02/15/15 212 lb (96.163 kg)  11/13/14 218 lb (98.884 kg)  08/17/14 214 lb (97.07 kg)    Ocular: Sclerae unicteric, pupils equal, round and reactive to light Ear-nose-throat: Oropharynx clear, dentition fair Lymphatic: No cervical or supraclavicular adenopathy Lungs no rales or rhonchi, good excursion bilaterally Heart regular rate and rhythm, no murmur appreciated Abd soft, nontender, positive bowel sounds MSK no focal spinal tenderness, no joint edema Neuro: non-focal, well-oriented, appropriate affect Breasts: Deferred  Lab Results  Component Value Date   WBC 7.7 08/17/2014   HGB 14.8 08/17/2014   HCT 43.5 08/17/2014   MCV 90 08/17/2014   PLT 283 08/17/2014   Lab Results  Component Value Date   FERRITIN 106 08/17/2014   IRON 54 08/17/2014   TIBC 276 08/17/2014   UIBC 222 08/17/2014   IRONPCTSAT 20* 08/17/2014   Lab Results  Component Value Date   RETICCTPCT 1.5 09/06/2012   RBC 4.81 08/17/2014   RETICCTABS 67.7 09/06/2012   No results found for: KPAFRELGTCHN, LAMBDASER, KAPLAMBRATIO No results found for: IGGSERUM, IGA, IGMSERUM No results found for: TOTALPROTELP, ALBUMINELP, A1GS, A2GS, BETS, BETA2SER, GAMS, MSPIKE, SPEI   Chemistry      Component Value Date/Time  NA 141 03/16/2014 1118   K 3.6* 03/16/2014 1118   CL 99 03/16/2014 1118   CO2 25 03/16/2014 1118   BUN 12 03/16/2014 1118   CREATININE 0.71 03/16/2014 1118      Component Value Date/Time   CALCIUM 9.5 03/16/2014 1118   ALKPHOS 95 09/20/2013 1215   AST 38* 09/20/2013 1215   ALT 62* 09/20/2013 1215   BILITOT 0.5 09/20/2013 1215     Impression and Plan: Kimberly Monroe is 54 year old white female with a history of an idiopathic pulmonary embolism. She wants to stay on Coumadin. She has had no bruising or episodes of bleeding.  Her blood count today are good. Her INR is 2.6.  She will continue to check her  INR weekly at home.  We will continue to follow along with her and plan to see her back in 8 months.  She knows to contact us with any questions or concerns. We can certainly see her sooner if need be.   Eliezer Bottom, NP 6/7/201610:19 AM

## 2015-03-14 ENCOUNTER — Encounter: Payer: Self-pay | Admitting: *Deleted

## 2015-03-14 LAB — CA 125: CA 125: 5 U/mL (ref <35)

## 2015-03-14 LAB — VITAMIN D 25 HYDROXY (VIT D DEFICIENCY, FRACTURES): VIT D 25 HYDROXY: 11 ng/mL — AB (ref 30–100)

## 2015-03-22 ENCOUNTER — Other Ambulatory Visit: Payer: Self-pay | Admitting: Hematology & Oncology

## 2015-03-22 ENCOUNTER — Telehealth: Payer: Self-pay | Admitting: *Deleted

## 2015-03-22 NOTE — Telephone Encounter (Signed)
Dr Marin Olp would like patient to start taking 2000 units of vitamin D. Spoke to patient and she will begin vitamin D supplementation.

## 2015-04-16 ENCOUNTER — Other Ambulatory Visit: Payer: Self-pay | Admitting: Nurse Practitioner

## 2015-04-16 MED ORDER — VITAMIN D 1000 UNITS PO TABS: 2000.0000 [IU] | ORAL_TABLET | Freq: Every day | ORAL | Status: DC

## 2015-06-28 ENCOUNTER — Other Ambulatory Visit: Payer: Self-pay | Admitting: Hematology & Oncology

## 2015-07-02 ENCOUNTER — Telehealth: Payer: Self-pay | Admitting: Pharmacist

## 2015-07-02 NOTE — Telephone Encounter (Signed)
Left vm for pt to contact [facility] Coumadin clinic. Last INR reported 02/12/15. Our clinic is closing for good on 07/06/15 and Dr. Marin Olp has agreed to manage pts INR's going forward per Montel Clock, Pharm.D. Pt was in process back in May 2016 of getting a new home monitor.   I called monitoring service today (07/02/15): [employer] [phone] & s/w representative, Peter Congo.  She stated on 03/13/15 they contacted Mrs. Jerelene Redden and requested she call them about returning her home monitor and asked if she needed a return label for shipping.  She did not return their call so they called again on 03/26/15 and again requested she call back about the same issue.  Later on 04/18/15, [employer] sent a label for shipping back the home monitor since they never heard from pt. To date there is no record w/ [employer] that pt returned her monitor for a new one.  They have therefore inactivated her account. I provided Dr. Antonieta Pert phone# & fax # where they would need to fax future reports (rather than to [place]). At this point what would need to be done is they need to hear from Mrs. Rutten & she would have to tell them to reactivate her account.  Once they talk to her, then they will fax paperwork to Dr. Marin Olp to reactivate her in their system.  She would need to agree that she will check her INR every 7 days consistently. I didn't leave details of my conversation w/ [employer] in my vm to her today.  Hopefully she will call us back and we can discuss at that time. Kennith Center, Pharm.D., CPP 07/02/2015@12 :58 PM

## 2015-07-06 NOTE — Progress Notes (Signed)
This encounter was created in error - please disregard.

## 2015-07-19 ENCOUNTER — Encounter: Payer: Self-pay | Admitting: Family Medicine

## 2015-07-19 ENCOUNTER — Ambulatory Visit (INDEPENDENT_AMBULATORY_CARE_PROVIDER_SITE_OTHER): Payer: BLUE CROSS/BLUE SHIELD | Admitting: Family Medicine

## 2015-07-19 VITALS — BP 134/90 | HR 70 | Temp 98.0°F | Ht 65.0 in | Wt 226.7 lb

## 2015-07-19 DIAGNOSIS — R030 Elevated blood-pressure reading, without diagnosis of hypertension: Secondary | ICD-10-CM

## 2015-07-19 DIAGNOSIS — IMO0001 Reserved for inherently not codable concepts without codable children: Secondary | ICD-10-CM

## 2015-07-19 NOTE — Progress Notes (Signed)
HPI:  Kimberly Monroe is a 54 yo F with a complicated PMH listed below here for an acute visit for:  Elevated BP -reports had the stomach bug this week and when started feeling bad had someone check BP at work and it was in the 150s/90 range -reports she has gradually stopped exercising over the last few years and diet not been good and she has slowly put on weight -she reports a FH of BP as well -denies: CP, SOB, DOE, poor vision, HA today  - reports most of the GI symptoms (NVD) have resolved -reports had lab work including renal function testing recently with her oncologist -she is reluctant to take medication for this as feels can improve lifestyle  ROS: See pertinent positives and negatives per HPI.  Past Medical History  Diagnosis Date  . Depression   . History of blood clotting disorder   . Anemia   . Anxiety   . Allergy     seasonal  . Pulmonary embolism ([place])   . Melanoma ([place]) 2009    insitu  . Basal cell cancer   . Squamous cell carcinoma ([place])   . Ovarian cancer ([place]) 1995    left ovary  . Migraine     history of/none in years  . Menorrhagia 02/10/2013    Past Surgical History  Procedure Laterality Date  . Cesarean section  1996, 2000    twins, singleton  . Breast ductal system excision  2009    L intraductal papilloma  . Laparoscopy with ovarian cystectomy  1994    ovarian torsion  . 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 Mohs surgery  2009    Dr Link Snuffer  . Abdominal hysterectomy  03/06/2013    Family History  Problem Relation Age of Onset  . Cancer Father 79    colon cancer  . Colon cancer Father   . Cancer Paternal Uncle     prostate  . Breast cancer Mother 67    Social History   Social History  . Marital Status: Married    Spouse Name: N/A  . Number of Children: N/A  . Years of Education: N/A   Social History Main Topics  . Smoking status: Never Smoker   . Smokeless tobacco: Never Used     Comment: never used cigarettes  .  Alcohol Use: 0.0 oz/week    0 Standard drinks or equivalent per week     Comment: wine-occassionally  . Drug Use: No  . Sexual Activity: Not Asked   Other Topics Concern  . None   Social History Narrative     Current outpatient prescriptions:  .  cholecalciferol (VITAMIN D) 1000 UNITS tablet, Take 2 tablets (2,000 Units total) by mouth daily., Disp: 60 tablet, Rfl: 6 .  EPINEPHrine (EPIPEN) 0.3 mg/0.3 mL IJ SOAJ injection, Inject 0.3 mLs (0.3 mg total) into the muscle once., Disp: 1 Device, Rfl: 2 .  warfarin (COUMADIN) 7.5 MG tablet, TAKE 1 TABLET BY MOUTH EVERY DAY, Disp: 30 tablet, Rfl: 2  EXAM:  Filed Vitals:   07/19/15 1443  BP: 134/90  Pulse: 70  Temp: 98 F (36.7 C)    Body mass index is 37.72 kg/(m^2).  GENERAL: vitals reviewed and listed above, alert, oriented, appears well hydrated and in no acute distress  HEENT: atraumatic, conjunttiva clear, no obvious abnormalities on inspection of external nose and ears  NECK: no obvious masses on inspection  LUNGS: clear to auscultation bilaterally, no wheezes, rales  or rhonchi, good air movement  CV: HRRR, no peripheral edema  MS: moves all extremities without noticeable abnormality  PSYCH: pleasant and cooperative, no obvious depression or anxiety  ASSESSMENT AND PLAN:  Discussed the following assessment and plan:  Elevated blood pressure  -her blood pressure is elevated mildly today, we discussed goal blood pressure and treatment and workup options -she feels this is likely due to poor lifestyle habits and has no concerning symptoms today and has opted to recheck with PCP in a few weeks -Patient advised to return or notify a doctor immediately if symptoms worsen or persist or new concerns arise.  Patient Instructions  BEFORE YOU LEAVE: -schedule follow up with your doctor in 2-4 weeks to recheck your blood pressure and for routine follow up  We recommend the following healthy lifestyle measures: - eat a  healthy whole foods diet consisting of regular small meals composed of vegetables, fruits, beans, nuts, seeds, healthy meats such as white chicken and fish and whole grains.  - avoid sweets, white starchy foods, fried foods, fast food, processed foods, sodas, red meet and other fattening foods.  - get a least 150-300 minutes of aerobic exercise per week.       Colin Benton R.

## 2015-07-19 NOTE — Progress Notes (Signed)
Pre visit review using our clinic review tool, if applicable. No additional management support is needed unless otherwise documented below in the visit note. 

## 2015-07-19 NOTE — Patient Instructions (Signed)
BEFORE YOU LEAVE: -schedule follow up with your doctor in 2-4 weeks to recheck your blood pressure and for routine follow up  We recommend the following healthy lifestyle measures: - eat a healthy whole foods diet consisting of regular small meals composed of vegetables, fruits, beans, nuts, seeds, healthy meats such as white chicken and fish and whole grains.  - avoid sweets, white starchy foods, fried foods, fast food, processed foods, sodas, red meet and other fattening foods.  - get a least 150-300 minutes of aerobic exercise per week.

## 2015-07-23 ENCOUNTER — Telehealth: Payer: Self-pay | Admitting: Family Medicine

## 2015-07-23 MED ORDER — EPINEPHRINE 0.3 MG/0.3ML IJ SOAJ: 0.3000 mg | Freq: Once | INTRAMUSCULAR | Status: DC

## 2015-07-23 NOTE — Telephone Encounter (Signed)
Rx was call in  

## 2015-07-23 NOTE — Telephone Encounter (Signed)
Pt saw dr Maudie Mercury on 10/13 and was advised to wait until Dr Elease Hashimoto got back. Pt is needing asap   EPINEPHrine (EPIPEN) 0.3 mg/0.3 mL IJ SOAJ injection  [facility]

## 2015-07-25 ENCOUNTER — Telehealth: Payer: Self-pay | Admitting: Family Medicine

## 2015-07-25 NOTE — Telephone Encounter (Signed)
[facility] - Client [place] [facility] Patient Name: Kimberly Monroe DOB: December 02, 1960 Initial Comment Has allergies, med ques Nurse Assessment Nurse: Martyn Ehrich RN, Felicia Date/Time (Eastern Time): 07/25/2015 4:13:55 PM Confirm and document reason for call. If symptomatic, describe symptoms. ---PT is having an allergic response. She is trying to figure out what to take. Pt is having hives and raspy throat. It started 2 d ago. She called today made appointment for [place] office and they told her they can't see her until she is off benadryl for 3 days. She has never been there for. No fever Has the patient traveled out of the country within the last 30 days? ---No Does the patient have any new or worsening symptoms? ---Yes Will a triage be completed? ---YesRelated visit to physician within the last 2 weeks? ---No Does the PT have any chronic conditions? (i.e. diabetes, asthma, etc.) ---Yes List chronic conditions. ---she is on blood thinners bc of PEs in the past Did the patient indicate they were pregnant? ---No Guidelines Guideline Title Affirmed Question Affirmed Notes Hives Widespread hives (all triage questions negative) Hoarseness Mild hoarseness (all triage questions negative) Final Disposition User See Physician within 4 Hours (or PCP triage) Martyn Ehrich, RN, Felicia Comments upgraded to see MD in 4 hours bc she had hoarseness come on twice with hives recently. Today it started this morning with the return of the hives and she is not having any trouble breathing - no cough this afternoon - She had the same thing happen 2 d ago. Denies any known food allergies - ate fish 2 d ago. Bought some shrimp but did not eat it. She has eaten shrimp without trouble before. She went to ER once in the past for breathing issues with hives - She did not want to come to office today - has appointment at [place] office in  3 days. Referrals GO TO FACILITY REFUSED Disagree/Comply: Comply Call Id: [id number]

## 2015-07-26 NOTE — Telephone Encounter (Signed)
FYI

## 2015-08-02 ENCOUNTER — Ambulatory Visit: Payer: BLUE CROSS/BLUE SHIELD | Admitting: Family Medicine

## 2015-08-02 DIAGNOSIS — Z0289 Encounter for other administrative examinations: Secondary | ICD-10-CM

## 2015-09-06 ENCOUNTER — Telehealth: Payer: Self-pay | Admitting: Family Medicine

## 2015-09-06 NOTE — Telephone Encounter (Signed)
Dr Elease Hashimoto your pt would like you to accept her daughter as new pt. Can I sch?

## 2015-09-10 NOTE — Telephone Encounter (Signed)
Dr. Elease Hashimoto accepts daughter. Please call to schedule.

## 2015-09-10 NOTE — Telephone Encounter (Signed)
OK 

## 2015-09-10 NOTE — Telephone Encounter (Signed)
Pt has been sch

## 2015-09-20 ENCOUNTER — Other Ambulatory Visit: Payer: Self-pay | Admitting: Hematology & Oncology

## 2015-11-13 ENCOUNTER — Other Ambulatory Visit (HOSPITAL_BASED_OUTPATIENT_CLINIC_OR_DEPARTMENT_OTHER): Payer: BLUE CROSS/BLUE SHIELD

## 2015-11-13 ENCOUNTER — Ambulatory Visit (HOSPITAL_BASED_OUTPATIENT_CLINIC_OR_DEPARTMENT_OTHER): Payer: BLUE CROSS/BLUE SHIELD | Admitting: Hematology & Oncology

## 2015-11-13 ENCOUNTER — Other Ambulatory Visit: Payer: Self-pay | Admitting: *Deleted

## 2015-11-13 VITALS — BP 147/87 | HR 64 | Temp 98.5°F | Resp 20 | Wt 227.0 lb

## 2015-11-13 DIAGNOSIS — I2699 Other pulmonary embolism without acute cor pulmonale: Secondary | ICD-10-CM | POA: Diagnosis not present

## 2015-11-13 DIAGNOSIS — D509 Iron deficiency anemia, unspecified: Secondary | ICD-10-CM

## 2015-11-13 DIAGNOSIS — M810 Age-related osteoporosis without current pathological fracture: Secondary | ICD-10-CM

## 2015-11-13 DIAGNOSIS — C4321 Malignant melanoma of right ear and external auricular canal: Secondary | ICD-10-CM

## 2015-11-13 LAB — IRON AND TIBC
%SAT: 32 % (ref 21–57)
IRON: 94 ug/dL (ref 41–142)
TIBC: 290 ug/dL (ref 236–444)
UIBC: 196 ug/dL (ref 120–384)

## 2015-11-13 LAB — CBC WITH DIFFERENTIAL (CANCER CENTER ONLY)
BASO#: 0.1 10*3/uL (ref 0.0–0.2)
BASO%: 0.7 % (ref 0.0–2.0)
EOS%: 1.9 % (ref 0.0–7.0)
Eosinophils Absolute: 0.1 10*3/uL (ref 0.0–0.5)
HEMATOCRIT: 45.3 % (ref 34.8–46.6)
HEMOGLOBIN: 15.3 g/dL (ref 11.6–15.9)
LYMPH#: 2.2 10*3/uL (ref 0.9–3.3)
LYMPH%: 29.7 % (ref 14.0–48.0)
MCH: 30.4 pg (ref 26.0–34.0)
MCHC: 33.8 g/dL (ref 32.0–36.0)
MCV: 90 fL (ref 81–101)
MONO#: 0.5 10*3/uL (ref 0.1–0.9)
MONO%: 6.6 % (ref 0.0–13.0)
NEUT%: 61.1 % (ref 39.6–80.0)
NEUTROS ABS: 4.6 10*3/uL (ref 1.5–6.5)
Platelets: 281 10*3/uL (ref 145–400)
RBC: 5.04 10*6/uL (ref 3.70–5.32)
RDW: 13.9 % (ref 11.1–15.7)
WBC: 7.5 10*3/uL (ref 3.9–10.0)

## 2015-11-13 LAB — PROTIME-INR (CHCC SATELLITE)
INR: 1.4 — AB (ref 2.0–3.5)
Protime: 16.8 Seconds — ABNORMAL HIGH (ref 10.6–13.4)

## 2015-11-13 LAB — FERRITIN: FERRITIN: 76 ng/mL (ref 9–269)

## 2015-11-13 NOTE — Progress Notes (Signed)
Hematology and Oncology Follow Up Visit  CHIRSTINE EBSEN [id number] 07-Jul-1961 55 y.o. 11/13/2015   Principle Diagnosis:  Idiopathic pulmonary embolism Iron deficiency anemia  Current Therapy:   Therapeutic Coumadin IV iron as indicated    Interim History:  Ms. Kimberly Monroe is here today for a follow-up. We last saw her 8 months ago. Since then, she been doing fairly well. Her 220 daughters oncologist. Her youngest one is about to go to college. She is a very good 5 ballplayer.  This Biskner and her family went to [place] in November. Had a really good time.  She is still on Coumadin. She'll 7.5 mg a day. She forgot the Coumadin on her trip. Her INR is a little bit on the low side. It is 1.4. I will not change her dosing.  She's had no bleeding.  She does get her mammograms routinely. She has had a hysterectomy.  She's had no problems with cough or shortness of breath. She's had no chest wall pain. She's had no leg swelling.  Overall, her performance status is ECOG 0.    Medications:    Medication List       This list is accurate as of: 11/13/15  9:25 AM.  Always use your most recent med list.               cholecalciferol 1000 units tablet  Commonly known as:  VITAMIN D  Take 2 tablets (2,000 Units total) by mouth daily.     EPINEPHrine 0.3 mg/0.3 mL Soaj injection  Commonly known as:  EPI-PEN  Inject 0.3 mLs (0.3 mg total) into the muscle once.     warfarin 7.5 MG tablet  Commonly known as:  COUMADIN  TAKE 1 TABLET BY MOUTH EVERY DAY        Allergies:  Allergies  Allergen Reactions  . Lime Flavor [Flavoring Agent] Anaphylaxis  . Penicillins Other (See Comments)    As a child Possibility-patient has never taken it    Past Medical History, Surgical history, Social history, and Family History were reviewed and updated.  Review of Systems: All other 10 point review of systems is negative.   Physical Exam:  weight is 227 lb (102.967 kg). Her oral temperature is  98.5 F (36.9 C). Her blood pressure is 147/87 and her pulse is 64. Her respiration is 20.   Wt Readings from Last 3 Encounters:  11/13/15 227 lb (102.967 kg)  07/19/15 226 lb 11.2 oz (102.83 kg)  03/13/15 224 lb (101.606 kg)    Ocular: Sclerae unicteric, pupils equal, round and reactive to light Ear-nose-throat: Oropharynx clear, dentition fair Lymphatic: No cervical or supraclavicular adenopathy Lungs no rales or rhonchi, good excursion bilaterally Heart regular rate and rhythm, no murmur appreciated Abd soft, nontender, positive bowel sounds MSK no focal spinal tenderness, no joint edema Neuro: non-focal, well-oriented, appropriate affect Breasts: Deferred  Lab Results  Component Value Date   WBC 7.5 11/13/2015   HGB 15.3 11/13/2015   HCT 45.3 11/13/2015   MCV 90 11/13/2015   PLT 281 11/13/2015   Lab Results  Component Value Date   FERRITIN 69 03/13/2015   IRON 71 03/13/2015   TIBC 281 03/13/2015   UIBC 210 03/13/2015   IRONPCTSAT 25 03/13/2015   Lab Results  Component Value Date   RETICCTPCT 1.5 09/06/2012   RBC 5.04 11/13/2015   RETICCTABS 67.7 09/06/2012   No results found for: KPAFRELGTCHN, LAMBDASER, KAPLAMBRATIO No results found for: IGGSERUM, IGA, IGMSERUM No results found for:  Odetta Pink, SPEI   Chemistry      Component Value Date/Time   NA 138 03/13/2015 0958   K 3.7 03/13/2015 0958   CL 105 03/13/2015 0958   CO2 25 03/13/2015 0958   BUN 9 03/13/2015 0958   CREATININE 0.58 03/13/2015 0958      Component Value Date/Time   CALCIUM 9.2 03/13/2015 0958   ALKPHOS 92 03/13/2015 0958   AST 28 03/13/2015 0958   ALT 35 03/13/2015 0958   BILITOT 0.4 03/13/2015 0958     Impression and Plan: Ms. Yohn is 55 year old white female with a history of an idiopathic pulmonary embolism. She wants to stay on Coumadin.   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  [facility] plan to see her back in 6 months.   Volanda Napoleon, MD 2/7/20179:25 AM

## 2015-11-14 LAB — VITAMIN D 25 HYDROXY (VIT D DEFICIENCY, FRACTURES): Vitamin D, 25-Hydroxy: 13.3 ng/mL — ABNORMAL LOW (ref 30.0–100.0)

## 2015-11-15 ENCOUNTER — Encounter: Payer: Self-pay | Admitting: *Deleted

## 2015-11-20 ENCOUNTER — Telehealth: Payer: Self-pay | Admitting: *Deleted

## 2015-11-20 NOTE — Telephone Encounter (Addendum)
Patient is aware of results. She is not taking any vitamin D she will restart taking it daily.   ----- Message from Volanda Napoleon, MD sent at 11/14/2015 12:25 PM EST ----- Call - vit D level is very low!!  Is she taking any??  If so she needs 50000 units q wk.  Please call this in!! pete

## 2015-11-21 ENCOUNTER — Other Ambulatory Visit: Payer: Self-pay | Admitting: *Deleted

## 2015-11-21 ENCOUNTER — Encounter: Payer: Self-pay | Admitting: *Deleted

## 2015-11-21 DIAGNOSIS — Z862 Personal history of diseases of the blood and blood-forming organs and certain disorders involving the immune mechanism: Secondary | ICD-10-CM

## 2015-11-21 MED ORDER — ERGOCALCIFEROL 1.25 MG (50000 UT) PO CAPS: 50000.0000 [IU] | ORAL_CAPSULE | ORAL | Status: DC

## 2016-01-17 ENCOUNTER — Other Ambulatory Visit: Payer: Self-pay | Admitting: Hematology & Oncology

## 2016-04-25 ENCOUNTER — Other Ambulatory Visit: Payer: Self-pay | Admitting: Hematology & Oncology

## 2016-05-14 ENCOUNTER — Other Ambulatory Visit: Payer: BLUE CROSS/BLUE SHIELD

## 2016-05-14 ENCOUNTER — Ambulatory Visit: Payer: BLUE CROSS/BLUE SHIELD | Admitting: Family

## 2016-05-20 ENCOUNTER — Other Ambulatory Visit (HOSPITAL_BASED_OUTPATIENT_CLINIC_OR_DEPARTMENT_OTHER): Payer: BLUE CROSS/BLUE SHIELD

## 2016-05-20 ENCOUNTER — Ambulatory Visit (HOSPITAL_BASED_OUTPATIENT_CLINIC_OR_DEPARTMENT_OTHER): Payer: BLUE CROSS/BLUE SHIELD | Admitting: Family

## 2016-05-20 VITALS — BP 150/93 | HR 59 | Temp 97.9°F | Resp 18 | Ht 65.0 in | Wt 205.0 lb

## 2016-05-20 DIAGNOSIS — M810 Age-related osteoporosis without current pathological fracture: Secondary | ICD-10-CM

## 2016-05-20 DIAGNOSIS — E559 Vitamin D deficiency, unspecified: Secondary | ICD-10-CM

## 2016-05-20 DIAGNOSIS — D509 Iron deficiency anemia, unspecified: Secondary | ICD-10-CM | POA: Diagnosis not present

## 2016-05-20 DIAGNOSIS — I2699 Other pulmonary embolism without acute cor pulmonale: Secondary | ICD-10-CM

## 2016-05-20 DIAGNOSIS — C4321 Malignant melanoma of right ear and external auricular canal: Secondary | ICD-10-CM

## 2016-05-20 LAB — CBC WITH DIFFERENTIAL (CANCER CENTER ONLY)
BASO#: 0.1 10*3/uL (ref 0.0–0.2)
BASO%: 0.8 % (ref 0.0–2.0)
EOS ABS: 0.1 10*3/uL (ref 0.0–0.5)
EOS%: 2.2 % (ref 0.0–7.0)
HCT: 44.2 % (ref 34.8–46.6)
HGB: 15.4 g/dL (ref 11.6–15.9)
LYMPH#: 2.1 10*3/uL (ref 0.9–3.3)
LYMPH%: 33.7 % (ref 14.0–48.0)
MCH: 31 pg (ref 26.0–34.0)
MCHC: 34.8 g/dL (ref 32.0–36.0)
MCV: 89 fL (ref 81–101)
MONO#: 0.5 10*3/uL (ref 0.1–0.9)
MONO%: 7.2 % (ref 0.0–13.0)
NEUT#: 3.6 10*3/uL (ref 1.5–6.5)
NEUT%: 56.1 % (ref 39.6–80.0)
PLATELETS: 280 10*3/uL (ref 145–400)
RBC: 4.96 10*6/uL (ref 3.70–5.32)
RDW: 13.9 % (ref 11.1–15.7)
WBC: 6.4 10*3/uL (ref 3.9–10.0)

## 2016-05-20 LAB — COMPREHENSIVE METABOLIC PANEL
ALBUMIN: 3.5 g/dL (ref 3.5–5.0)
ALT: 46 U/L (ref 0–55)
AST: 31 U/L (ref 5–34)
Alkaline Phosphatase: 101 U/L (ref 40–150)
Anion Gap: 10 mEq/L (ref 3–11)
BUN: 10.8 mg/dL (ref 7.0–26.0)
CO2: 26 mEq/L (ref 22–29)
Calcium: 9.4 mg/dL (ref 8.4–10.4)
Chloride: 106 mEq/L (ref 98–109)
Creatinine: 0.7 mg/dL (ref 0.6–1.1)
GLUCOSE: 106 mg/dL (ref 70–140)
Potassium: 4.1 mEq/L (ref 3.5–5.1)
Sodium: 143 mEq/L (ref 136–145)
TOTAL PROTEIN: 6.9 g/dL (ref 6.4–8.3)
Total Bilirubin: 0.34 mg/dL (ref 0.20–1.20)

## 2016-05-20 LAB — PROTIME-INR (CHCC SATELLITE)
INR: 1 — ABNORMAL LOW (ref 2.0–3.5)
Protime: 12 Seconds (ref 10.6–13.4)

## 2016-05-20 MED ORDER — ERGOCALCIFEROL 1.25 MG (50000 UT) PO CAPS: 50000.0000 [IU] | ORAL_CAPSULE | ORAL | 6 refills | Status: DC

## 2016-05-20 NOTE — Progress Notes (Signed)
Hematology and Oncology Follow Up Visit  AMANY SIVILAY [id number] 25-Dec-1960 55 y.o. 05/20/2016   Principle Diagnosis:  Idiopathic pulmonary embolism Iron deficiency anemia  Current Therapy:   Therapeutic Coumadin IV iron as indicated    Interim History:  Ms. Vittone is here today for a follow-up. She is doing well and just finished moving one of daughters into her college dorm. Unfortunately, they had a death in her family several weeks ago and when they went out of town for the funeral she forgot her Coumadin. She has not restarted taking it. She has been off for a little over 3 weeks. Her INR at this time is 1.0.  No fever, chills, n/v, cough, rash, dizziness, SOB, chest pain, abdominal pain or changes in bowel or bladder habits. She has palpitations a couple times each week and plans to follow up with her PCP regarding the possibility of trying a Holter monitor.  No swelling, tenderness, numbness or tingling in her extremities. No new aches or pains.  She has maintained a good appetite and is eating a low carb high protein diet. She is making sure to stay well hydrated. Her weight is down 22 lbs and she is quite excited about that.   Medications:    Medication List       Accurate as of 05/20/16  8:58 AM. Always use your most recent med list.          cholecalciferol 1000 units tablet Commonly known as:  VITAMIN D Take 2 tablets (2,000 Units total) by mouth daily.   EPINEPHrine 0.3 mg/0.3 mL Soaj injection Commonly known as:  EPI-PEN Inject 0.3 mLs (0.3 mg total) into the muscle once.   ergocalciferol 50000 units capsule Commonly known as:  VITAMIN D2 Take 1 capsule (50,000 Units total) by mouth once a week.   warfarin 7.5 MG tablet Commonly known as:  COUMADIN TAKE 1 TABLET BY MOUTH EVERY DAY       Allergies:  Allergies  Allergen Reactions  . Lime Flavor [Flavoring Agent] Anaphylaxis  . Amoxicillin     Other reaction(s): Other (See  Comments) Possibility-patient has never taken it  . Penicillins Other (See Comments)    As a child Possibility-patient has never taken it    Past Medical History, Surgical history, Social history, and Family History were reviewed and updated.  Review of Systems: All other 10 point review of systems is negative.   Physical Exam:  height is 5\' 5"  (1.651 m) and weight is 205 lb (93 kg). Her oral temperature is 97.9 F (36.6 C). Her blood pressure is 150/93 (abnormal) and her pulse is 59 (abnormal). Her respiration is 18.   Wt Readings from Last 3 Encounters:  05/20/16 205 lb (93 kg)  11/13/15 227 lb (103 kg)  07/19/15 226 lb 11.2 oz (102.8 kg)    Ocular: Sclerae unicteric, pupils equal, round and reactive to light Ear-nose-throat: Oropharynx clear, dentition fair Lymphatic: No cervical supraclavicular or axillary adenopathy Lungs no rales or rhonchi, good excursion bilaterally Heart regular rate and rhythm, no murmur appreciated Abd soft, nontender, positive bowel sounds, no liver or spleen tip palpated on exam  MSK no focal spinal tenderness, no joint edema Neuro: non-focal, well-oriented, appropriate affect Breasts: Deferred  Lab Results  Component Value Date   WBC 6.4 05/20/2016   HGB 15.4 05/20/2016   HCT 44.2 05/20/2016   MCV 89 05/20/2016   PLT 280 05/20/2016   Lab Results  Component Value Date   FERRITIN 76 11/13/2015  IRON 94 11/13/2015   TIBC 290 11/13/2015   UIBC 196 11/13/2015   IRONPCTSAT 32 11/13/2015   Lab Results  Component Value Date   RETICCTPCT 1.5 09/06/2012   RBC 4.96 05/20/2016   RETICCTABS 67.7 09/06/2012   No results found for: KPAFRELGTCHN, LAMBDASER, KAPLAMBRATIO No results found for: IGGSERUM, IGA, IGMSERUM No results found for: Odetta Pink, SPEI   Chemistry      Component Value Date/Time   NA 138 03/13/2015 0958   K 3.7 03/13/2015 0958   CL 105 03/13/2015 0958   CO2 25  03/13/2015 0958   BUN 9 03/13/2015 0958   CREATININE 0.58 03/13/2015 0958      Component Value Date/Time   CALCIUM 9.2 03/13/2015 0958   ALKPHOS 92 03/13/2015 0958   AST 28 03/13/2015 0958   ALT 35 03/13/2015 0958   BILITOT 0.4 03/13/2015 0958     Impression and Plan: Ms. Estime is 55 yo white female with a history of an idiopathic pulmonary embolism. She prefers to stay on Coumadin. She forgot her coumadin while out of town and has not taken in over 3 weeks. Her INR is 1.0. She is asymptomatic with at this time.  We will have her restart at her previous dose. We will then check her INR every 2 weeks for now and make dose adjustments as needed.  Refill was placed for her Vitamin D supplement.  We will plan to see her back in 2 months for repeat labs and follow-up.  She knows to contact us with any questions or concerns. We can certainly see her sooner if need be.   Eliezer Bottom, NP 8/15/20178:58 AM

## 2016-05-21 ENCOUNTER — Encounter: Payer: Self-pay | Admitting: *Deleted

## 2016-05-21 LAB — VITAMIN D 25 HYDROXY (VIT D DEFICIENCY, FRACTURES): Vitamin D, 25-Hydroxy: 19.7 ng/mL — ABNORMAL LOW (ref 30.0–100.0)

## 2016-06-03 ENCOUNTER — Other Ambulatory Visit (HOSPITAL_BASED_OUTPATIENT_CLINIC_OR_DEPARTMENT_OTHER): Payer: BLUE CROSS/BLUE SHIELD

## 2016-06-03 DIAGNOSIS — I2699 Other pulmonary embolism without acute cor pulmonale: Secondary | ICD-10-CM

## 2016-06-03 DIAGNOSIS — Z862 Personal history of diseases of the blood and blood-forming organs and certain disorders involving the immune mechanism: Secondary | ICD-10-CM

## 2016-06-03 LAB — PROTIME-INR (CHCC SATELLITE)
INR: 2 (ref 2.0–3.5)
Protime: 24 Seconds — ABNORMAL HIGH (ref 10.6–13.4)

## 2016-06-05 ENCOUNTER — Encounter: Payer: Self-pay | Admitting: *Deleted

## 2016-06-20 ENCOUNTER — Ambulatory Visit (INDEPENDENT_AMBULATORY_CARE_PROVIDER_SITE_OTHER): Payer: BLUE CROSS/BLUE SHIELD | Admitting: Family Medicine

## 2016-06-20 ENCOUNTER — Telehealth: Payer: Self-pay | Admitting: Family Medicine

## 2016-06-20 ENCOUNTER — Encounter: Payer: Self-pay | Admitting: Family Medicine

## 2016-06-20 VITALS — BP 150/90 | HR 63 | Temp 98.7°F | Ht 65.0 in | Wt 210.0 lb

## 2016-06-20 DIAGNOSIS — I10 Essential (primary) hypertension: Secondary | ICD-10-CM | POA: Insufficient documentation

## 2016-06-20 MED ORDER — LISINOPRIL 10 MG PO TABS: 10.0000 mg | ORAL_TABLET | Freq: Every day | ORAL | 3 refills | Status: DC

## 2016-06-20 NOTE — Progress Notes (Signed)
   Subjective:    Patient ID: Kimberly Monroe, female    DOB: 04-06-1961, 55 y.o.   MRN: [id number]  HPI Here for elevated BP readings. I see from her chart that her BP has been tending higher over the past 6 months, ofrm the Q000111Q systolic to the Q000111Q. Her diastolic readings have trended up from the 70s to the 90s. At work this am she asked a coworker to check it, and it was 183/100. She feels fine, no headache or chest pain or SOB. She does not use tobacco. She has actually been trying to lose  weight over the past 6 months, and she has lost 30 lbs. She had labs on 05-20-16 through the Oncology clinic and her renal function was normal.    Review of Systems  Constitutional: Negative.   HENT: Negative.   Respiratory: Negative.   Cardiovascular: Negative.   Neurological: Negative.        Objective:   Physical Exam  Constitutional: She is oriented to person, place, and time. She appears well-developed and well-nourished.  Neck: No thyromegaly present.  Cardiovascular: Normal rate, regular rhythm, normal heart sounds and intact distal pulses.   Pulmonary/Chest: Effort normal and breath sounds normal.  Musculoskeletal: She exhibits no edema.  Lymphadenopathy:    She has no cervical adenopathy.  Neurological: She is alert and oriented to person, place, and time.          Assessment & Plan:  Newly diagnosed HTN. Start on Lisinopril 10 mg daily. Watch the sodium intake. Continue with exercise regimen. Follow up with Dr. Elease Hashimoto in 2-3 weeks.  Laurey Morale, MD

## 2016-06-20 NOTE — Progress Notes (Signed)
Pre visit review using our clinic review tool, if applicable. No additional management support is needed unless otherwise documented below in the visit note. 

## 2016-06-20 NOTE — Telephone Encounter (Signed)
Patient Name: [name]  DOB: 08-27-1961    Initial Comment Caller states bp is 182/98, feeling anxious.   Nurse Assessment  Nurse: Verlin Fester RN, Stanton Kidney Date/Time (Eastern Time): 06/20/2016 11:51:22 AM  Confirm and document reason for call. If symptomatic, describe symptoms. You must click the next button to save text entered. ---Patient states her BP is 182/98, feeling anxious.  Has the patient traveled out of the country within the last 30 days? ---No  Does the patient have any new or worsening symptoms? ---Yes  Will a triage be completed? ---Yes  Related visit to physician within the last 2 weeks? ---No  Does the PT have any chronic conditions? (i.e. diabetes, asthma, etc.) ---Yes  List chronic conditions. ---"hx bilateral Pulmonary emboli  Is this a behavioral health or substance abuse call? ---No     Guidelines    Guideline Title Affirmed Question Affirmed Notes  High Blood Pressure BP ? 180/110    Final Disposition User   See Physician within [place], RN, [facility]    Referrals  REFERRED TO PCP OFFICE   Disagree/Comply: Leta Baptist

## 2016-06-20 NOTE — Telephone Encounter (Signed)
Pt is scheduled at 330 today.

## 2016-06-20 NOTE — Telephone Encounter (Signed)
Please Advise pt is scheduled with Dr. Sarajane Jews today @ 330

## 2016-06-23 ENCOUNTER — Other Ambulatory Visit: Payer: Self-pay | Admitting: Nurse Practitioner

## 2016-06-23 DIAGNOSIS — C4321 Malignant melanoma of right ear and external auricular canal: Secondary | ICD-10-CM

## 2016-06-23 DIAGNOSIS — E559 Vitamin D deficiency, unspecified: Secondary | ICD-10-CM

## 2016-06-23 DIAGNOSIS — Z86711 Personal history of pulmonary embolism: Secondary | ICD-10-CM

## 2016-06-24 ENCOUNTER — Other Ambulatory Visit (HOSPITAL_BASED_OUTPATIENT_CLINIC_OR_DEPARTMENT_OTHER): Payer: BLUE CROSS/BLUE SHIELD

## 2016-06-24 DIAGNOSIS — I2699 Other pulmonary embolism without acute cor pulmonale: Secondary | ICD-10-CM | POA: Diagnosis not present

## 2016-06-24 DIAGNOSIS — Z86711 Personal history of pulmonary embolism: Secondary | ICD-10-CM

## 2016-06-24 DIAGNOSIS — C4321 Malignant melanoma of right ear and external auricular canal: Secondary | ICD-10-CM

## 2016-06-24 LAB — PROTIME-INR (CHCC SATELLITE)
INR: 2.5 (ref 2.0–3.5)
Protime: 30 Seconds — ABNORMAL HIGH (ref 10.6–13.4)

## 2016-07-04 ENCOUNTER — Ambulatory Visit (INDEPENDENT_AMBULATORY_CARE_PROVIDER_SITE_OTHER): Payer: BLUE CROSS/BLUE SHIELD | Admitting: Family Medicine

## 2016-07-04 VITALS — BP 140/72 | HR 73 | Temp 98.3°F | Ht 65.0 in | Wt 208.5 lb

## 2016-07-04 DIAGNOSIS — I10 Essential (primary) hypertension: Secondary | ICD-10-CM | POA: Diagnosis not present

## 2016-07-04 LAB — BASIC METABOLIC PANEL
BUN: 13 mg/dL (ref 6–23)
CALCIUM: 9.2 mg/dL (ref 8.4–10.5)
CO2: 32 mEq/L (ref 19–32)
Chloride: 105 mEq/L (ref 96–112)
Creatinine, Ser: 0.66 mg/dL (ref 0.40–1.20)
GFR: 98.77 mL/min (ref >60.00)
Glucose, Bld: 99 mg/dL (ref 70–99)
POTASSIUM: 4.1 meq/L (ref 3.5–5.1)
SODIUM: 143 meq/L (ref 135–145)

## 2016-07-04 NOTE — Patient Instructions (Signed)
Continue with weight loss efforts.   Let's plan follow up in 3 months.

## 2016-07-04 NOTE — Progress Notes (Signed)
Subjective:     Patient ID: Kimberly Monroe, female   DOB: 05-Feb-1961, 55 y.o.   MRN: [id number]  HPI Patient seen for follow-up hypertension. She's had several elevated readings for quite some time and was started on lisinopril 10 mg daily. She's not had any cough or other side effects. Her blood pressures been improved by home readings. Overall feels well. She is in the process of trying to lose some weight. Plan start walking more soon.  Past Medical History:  Diagnosis Date  . Allergy    seasonal  . Anemia   . Anxiety   . Basal cell cancer   . Depression   . History of blood clotting disorder   . Melanoma ([place]) 2009   insitu  . Menorrhagia 02/10/2013  . Migraine    history of/none in years  . Ovarian cancer ([place]) 1995   left ovary  . Pulmonary embolism ([place])   . Squamous cell carcinoma [facility])    Past Surgical History:  Procedure Laterality Date  . ABDOMINAL HYSTERECTOMY  03/06/2013  . BREAST DUCTAL SYSTEM EXCISION  2009   L intraductal papilloma  . [place]   twins, singleton  . laparoscopy with ovarian cystectomy  1994   ovarian torsion  . 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 MOHS SURGERY  2009   Dr Link Snuffer    reports that she has never smoked. She has never used smokeless tobacco. She reports that she drinks alcohol. She reports that she does not use drugs. family history includes Breast cancer (age of onset: 73) in her mother; Cancer in her paternal uncle; Cancer (age of onset: 40) in her father; Colon cancer in her father. Allergies  Allergen Reactions  . Lime Flavor [Flavoring Agent] Anaphylaxis  . Amoxicillin     Other reaction(s): Other (See Comments) Possibility-patient has never taken it  . Penicillins Other (See Comments)    As a child Possibility-patient has never taken it     Review of Systems  Constitutional: Negative for fatigue.  Eyes: Negative for visual disturbance.  Respiratory: Negative for cough, chest tightness,  shortness of breath and wheezing.   Cardiovascular: Negative for chest pain, palpitations and leg swelling.  Neurological: Negative for dizziness, seizures, syncope, weakness, light-headedness and headaches.       Objective:   Physical Exam  Constitutional: She appears well-developed and well-nourished.  Eyes: Pupils are equal, round, and reactive to light.  Neck: Neck supple. No JVD present. No thyromegaly present.  Cardiovascular: Normal rate and regular rhythm.  Exam reveals no gallop.   Pulmonary/Chest: Effort normal and breath sounds normal. No respiratory distress. She has no wheezes. She has no rales.  Musculoskeletal: She exhibits no edema.  Neurological: She is alert.       Assessment:     Hypertension-improved    Plan:     -Continue lisinopril 10 mg daily -Check basic metabolic panel with recent initiation of ACE inhibitor -Continue weight loss efforts and reassess in 3 months  Eulas Post MD [facility] at [facility]

## 2016-07-05 ENCOUNTER — Encounter: Payer: Self-pay | Admitting: Family Medicine

## 2016-07-08 ENCOUNTER — Other Ambulatory Visit (HOSPITAL_BASED_OUTPATIENT_CLINIC_OR_DEPARTMENT_OTHER): Payer: BLUE CROSS/BLUE SHIELD

## 2016-07-08 DIAGNOSIS — I2699 Other pulmonary embolism without acute cor pulmonale: Secondary | ICD-10-CM

## 2016-07-08 DIAGNOSIS — E559 Vitamin D deficiency, unspecified: Secondary | ICD-10-CM

## 2016-07-08 DIAGNOSIS — D509 Iron deficiency anemia, unspecified: Secondary | ICD-10-CM

## 2016-07-08 LAB — PROTIME-INR (CHCC SATELLITE)
INR: 4 — ABNORMAL HIGH (ref 2.0–3.5)
Protime: 48 Seconds — ABNORMAL HIGH (ref 10.6–13.4)

## 2016-07-09 ENCOUNTER — Encounter: Payer: Self-pay | Admitting: *Deleted

## 2016-07-15 ENCOUNTER — Ambulatory Visit: Payer: BLUE CROSS/BLUE SHIELD | Admitting: Family

## 2016-07-15 ENCOUNTER — Other Ambulatory Visit: Payer: BLUE CROSS/BLUE SHIELD

## 2016-07-20 ENCOUNTER — Other Ambulatory Visit: Payer: Self-pay | Admitting: Hematology & Oncology

## 2016-07-23 ENCOUNTER — Ambulatory Visit (HOSPITAL_BASED_OUTPATIENT_CLINIC_OR_DEPARTMENT_OTHER): Payer: BLUE CROSS/BLUE SHIELD | Admitting: Family

## 2016-07-23 ENCOUNTER — Other Ambulatory Visit (HOSPITAL_BASED_OUTPATIENT_CLINIC_OR_DEPARTMENT_OTHER): Payer: BLUE CROSS/BLUE SHIELD

## 2016-07-23 ENCOUNTER — Encounter: Payer: Self-pay | Admitting: Family

## 2016-07-23 VITALS — BP 134/83 | HR 58 | Temp 97.8°F | Resp 16 | Ht 65.0 in | Wt 210.0 lb

## 2016-07-23 DIAGNOSIS — D508 Other iron deficiency anemias: Secondary | ICD-10-CM | POA: Diagnosis not present

## 2016-07-23 DIAGNOSIS — D509 Iron deficiency anemia, unspecified: Secondary | ICD-10-CM

## 2016-07-23 DIAGNOSIS — Z86711 Personal history of pulmonary embolism: Secondary | ICD-10-CM

## 2016-07-23 DIAGNOSIS — E559 Vitamin D deficiency, unspecified: Secondary | ICD-10-CM

## 2016-07-23 DIAGNOSIS — I2699 Other pulmonary embolism without acute cor pulmonale: Secondary | ICD-10-CM

## 2016-07-23 LAB — COMPREHENSIVE METABOLIC PANEL
ALT: 27 U/L (ref 0–55)
ANION GAP: 8 meq/L (ref 3–11)
AST: 25 U/L (ref 5–34)
Albumin: 3.5 g/dL (ref 3.5–5.0)
Alkaline Phosphatase: 97 U/L (ref 40–150)
BUN: 14.6 mg/dL (ref 7.0–26.0)
CHLORIDE: 107 meq/L (ref 98–109)
CO2: 25 meq/L (ref 22–29)
Calcium: 9.3 mg/dL (ref 8.4–10.4)
Creatinine: 0.7 mg/dL (ref 0.6–1.1)
Glucose: 101 mg/dl (ref 70–140)
Potassium: 4.2 mEq/L (ref 3.5–5.1)
Sodium: 141 mEq/L (ref 136–145)
Total Bilirubin: 0.3 mg/dL (ref 0.20–1.20)
Total Protein: 7.1 g/dL (ref 6.4–8.3)

## 2016-07-23 LAB — CBC WITH DIFFERENTIAL (CANCER CENTER ONLY)
BASO#: 0 10*3/uL (ref 0.0–0.2)
BASO%: 0.4 % (ref 0.0–2.0)
EOS%: 2.2 % (ref 0.0–7.0)
Eosinophils Absolute: 0.2 10*3/uL (ref 0.0–0.5)
HCT: 44.5 % (ref 34.8–46.6)
HGB: 15.2 g/dL (ref 11.6–15.9)
LYMPH#: 2.2 10*3/uL (ref 0.9–3.3)
LYMPH%: 31.5 % (ref 14.0–48.0)
MCH: 30.5 pg (ref 26.0–34.0)
MCHC: 34.2 g/dL (ref 32.0–36.0)
MCV: 89 fL (ref 81–101)
MONO#: 0.4 10*3/uL (ref 0.1–0.9)
MONO%: 5.9 % (ref 0.0–13.0)
NEUT#: 4.1 10*3/uL (ref 1.5–6.5)
NEUT%: 60 % (ref 39.6–80.0)
PLATELETS: 277 10*3/uL (ref 145–400)
RBC: 4.98 10*6/uL (ref 3.70–5.32)
RDW: 14 % (ref 11.1–15.7)
WBC: 6.8 10*3/uL (ref 3.9–10.0)

## 2016-07-23 LAB — PROTIME-INR (CHCC SATELLITE)
INR: 3.1 (ref 2.0–3.5)
Protime: 37.2 Seconds — ABNORMAL HIGH (ref 10.6–13.4)

## 2016-07-23 NOTE — Progress Notes (Signed)
Hematology and Oncology Follow Up Visit  MARIELE BANGERT [id number] [birth year] 55 y.o. 07/23/2016   Principle Diagnosis:  Idiopathic pulmonary embolism Iron deficiency anemia  Current Therapy:   Therapeutic Coumadin IV iron as indicated    Interim History: Ms. Blomgren is here today for a follow-up. She is doing well back on her Coumadin and has had no issue with bleeding, bruising or petechiae. She is currently on 7.5 mg PO daily and her INR if therapeutic at 3.1.  She denies fatigue. Stays busy with her 3 daughters. 2 are in college now and her youngest is still in highschool taking advanced courses.  No fever, chills, n/v, cough, rash, dizziness, headache, SOB, chest pain, palpitations, abdominal pain or changes in bowel or bladder habits.  No swelling, tenderness, numbness or tingling in her extremities. No c/o joint aches or pain.  She has a great appetite and is staying well hydrated. Her weight is stable.   Medications:    Medication List       Accurate as of 07/23/16 10:36 AM. Always use your most recent med list.          EPINEPHrine 0.3 mg/0.3 mL Soaj injection Commonly known as:  EPI-PEN Inject 0.3 mLs (0.3 mg total) into the muscle once.   ergocalciferol 50000 units capsule Commonly known as:  VITAMIN D2 Take 1 capsule (50,000 Units total) by mouth once a week.   lisinopril 10 MG tablet Commonly known as:  PRINIVIL,ZESTRIL Take 1 tablet (10 mg total) by mouth daily.   warfarin 7.5 MG tablet Commonly known as:  COUMADIN TAKE 1 TABLET BY MOUTH EVERY DAY       Allergies:  Allergies  Allergen Reactions  . Lime Flavor [Flavoring Agent] Anaphylaxis  . Amoxicillin     Other reaction(s): Other (See Comments) Possibility-patient has never taken it  . Penicillins Other (See Comments)    As a child Possibility-patient has never taken it    Past Medical History, Surgical history, Social history, and Family History were reviewed and updated.  Review of  Systems: All other 10 point review of systems is negative.   Physical Exam:  height is 5\' 5"  (1.651 m) and weight is 210 lb (95.3 kg). Her oral temperature is 97.8 F (36.6 C). Her blood pressure is 134/83 and her pulse is 58 (abnormal). Her respiration is 16.   Wt Readings from Last 3 Encounters:  07/23/16 210 lb (95.3 kg)  07/04/16 208 lb 8 oz (94.6 kg)  06/20/16 210 lb (95.3 kg)    Ocular: Sclerae unicteric, pupils equal, round and reactive to light Ear-nose-throat: Oropharynx clear, dentition fair Lymphatic: No cervical supraclavicular or axillary adenopathy Lungs no rales or rhonchi, good excursion bilaterally Heart regular rate and rhythm, no murmur appreciated Abd soft, nontender, positive bowel sounds, no liver or spleen tip palpated on exam  MSK no focal spinal tenderness, no joint edema Neuro: non-focal, well-oriented, appropriate affect Breasts: Deferred  Lab Results  Component Value Date   WBC 6.4 05/20/2016   HGB 15.4 05/20/2016   HCT 44.2 05/20/2016   MCV 89 05/20/2016   PLT 280 05/20/2016   Lab Results  Component Value Date   FERRITIN 76 11/13/2015   IRON 94 11/13/2015   TIBC 290 11/13/2015   UIBC 196 11/13/2015   IRONPCTSAT 32 11/13/2015   Lab Results  Component Value Date   RETICCTPCT 1.5 09/06/2012   RBC 4.96 05/20/2016   RETICCTABS 67.7 09/06/2012   No results found for: KPAFRELGTCHN, LAMBDASER, KAPLAMBRATIO  No results found for: IGGSERUM, IGA, IGMSERUM No results found for: Odetta Pink, SPEI   Chemistry      Component Value Date/Time   NA 143 07/04/2016 1001   NA 143 05/20/2016 0817   K 4.1 07/04/2016 1001   K 4.1 05/20/2016 0817   CL 105 07/04/2016 1001   CO2 32 07/04/2016 1001   CO2 26 05/20/2016 0817   BUN 13 07/04/2016 1001   BUN 10.8 05/20/2016 0817   CREATININE 0.66 07/04/2016 1001   CREATININE 0.7 05/20/2016 0817      Component Value Date/Time   CALCIUM 9.2 07/04/2016 1001    CALCIUM 9.4 05/20/2016 0817   ALKPHOS 101 05/20/2016 0817   AST 31 05/20/2016 0817   ALT 46 05/20/2016 0817   BILITOT 0.34 05/20/2016 0817     Impression and Plan: Ms. Horsford is 55 yo white female with a history of an idiopathic pulmonary embolism - resolved. She prefers to stay on Coumadin and her INR is therapeutic at 3.1 on 7.5 mg PO daily. No issues with bleeding.  She will continue to have her INR checked once a month and coumadin adjusted accordingly.  We will plan to see her back in 3 months for repeat labs and MD follow-up.  She knows to contact us with any questions or concerns. We can certainly see her sooner if need be.   Eliezer Bottom, NP 10/18/201710:36 AM

## 2016-07-24 LAB — VITAMIN D 25 HYDROXY (VIT D DEFICIENCY, FRACTURES): Vitamin D, 25-Hydroxy: 28.4 ng/mL — ABNORMAL LOW (ref 30.0–100.0)

## 2016-07-28 ENCOUNTER — Telehealth: Payer: Self-pay | Admitting: *Deleted

## 2016-07-28 NOTE — Telephone Encounter (Signed)
Patient is on coumadin and hit her arm on Friday. She states she developed a significant bruise from the impact and that the site is now deep purple. She is going out of town and wants to make sure she doesn't need to do anything.   Since Friday, there is no sign of new bleeding and her arm hasn't developed any swelling. Spoke to [facility] NP and she wants patient to observe arm for signs of bleeding - new pink/red bruising, swelling or any coolness. If this occurs she needs to call the office back or seek emergent care. She understands these instructions.

## 2016-08-27 ENCOUNTER — Other Ambulatory Visit (HOSPITAL_BASED_OUTPATIENT_CLINIC_OR_DEPARTMENT_OTHER): Payer: BLUE CROSS/BLUE SHIELD

## 2016-08-27 ENCOUNTER — Other Ambulatory Visit: Payer: Self-pay | Admitting: Family Medicine

## 2016-08-27 ENCOUNTER — Ambulatory Visit: Payer: Self-pay | Admitting: Family

## 2016-08-27 ENCOUNTER — Encounter: Payer: Self-pay | Admitting: *Deleted

## 2016-08-27 DIAGNOSIS — D509 Iron deficiency anemia, unspecified: Secondary | ICD-10-CM

## 2016-08-27 DIAGNOSIS — I2699 Other pulmonary embolism without acute cor pulmonale: Secondary | ICD-10-CM

## 2016-08-27 DIAGNOSIS — E559 Vitamin D deficiency, unspecified: Secondary | ICD-10-CM

## 2016-08-27 LAB — PROTIME-INR (CHCC SATELLITE)
INR: 3 (ref 2.0–3.5)
Protime: 36 Seconds — ABNORMAL HIGH (ref 10.6–13.4)

## 2016-08-27 MED ORDER — LISINOPRIL 10 MG PO TABS: 10.0000 mg | ORAL_TABLET | Freq: Every day | ORAL | 0 refills | Status: DC

## 2016-08-27 NOTE — Telephone Encounter (Signed)
Refill request for Lisinopril 10 mg and a 90 day supply to [facility], which I did send e-scribe.

## 2016-09-13 ENCOUNTER — Other Ambulatory Visit: Payer: Self-pay | Admitting: Hematology & Oncology

## 2016-09-18 ENCOUNTER — Other Ambulatory Visit: Payer: Self-pay | Admitting: Family Medicine

## 2016-09-23 ENCOUNTER — Ambulatory Visit (INDEPENDENT_AMBULATORY_CARE_PROVIDER_SITE_OTHER): Payer: BLUE CROSS/BLUE SHIELD | Admitting: Family Medicine

## 2016-09-23 VITALS — BP 120/78 | HR 73 | Temp 98.3°F | Ht 65.0 in | Wt 214.2 lb

## 2016-09-23 DIAGNOSIS — I10 Essential (primary) hypertension: Secondary | ICD-10-CM | POA: Diagnosis not present

## 2016-09-23 NOTE — Patient Instructions (Signed)
Continue to lose some weight. Keep sodium intake less than 3000 mg per day. Aerobic exercise several days per week Monitor blood pressure and be in touch if consistently > 140/90. Bring in your machine to check with ours next visit.

## 2016-09-23 NOTE — Progress Notes (Signed)
Subjective:     Patient ID: Kimberly Monroe, female   DOB: 12-Aug-1961, 55 y.o.   MRN: [id number]  HPI Here for follow-up hypertension. We added lisinopril a few months ago. She is currently taking 10 mg once daily. She has unfortunately gained about 6 pounds since then. Poor compliance with diet. She does walk some for exercise. She's had some recent high readings at home including last evening where she had a reading of around 123XX123 systolic and over 123XX123 diastolic. She had some headache at that time. She is not sure if her machine is reading accurately. No chest pains. No peripheral edema. No cough.  Past Medical History:  Diagnosis Date  . Allergy    seasonal  . Anemia   . Anxiety   . Basal cell cancer   . Depression   . History of blood clotting disorder   . Melanoma ([place]) 2009   insitu  . Menorrhagia 02/10/2013  . Migraine    history of/none in years  . Ovarian cancer ([place]) 1995   left ovary  . Pulmonary embolism ([place])   . Squamous cell carcinoma    Past Surgical History:  Procedure Laterality Date  . ABDOMINAL HYSTERECTOMY  03/06/2013  . BREAST DUCTAL SYSTEM EXCISION  2009   L intraductal papilloma  . [place]   twins, singleton  . laparoscopy with ovarian cystectomy  1994   ovarian torsion  . 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 MOHS SURGERY  2009   Dr Link Snuffer    reports that she has never smoked. She has never used smokeless tobacco. She reports that she drinks alcohol. She reports that she does not use drugs. family history includes Breast cancer (age of onset: 70) in her mother; Cancer in her paternal uncle; Cancer (age of onset: 74) in her father; Colon cancer in her father. Allergies  Allergen Reactions  . Lime Flavor [Flavoring Agent] Anaphylaxis  . Amoxicillin     Other reaction(s): Other (See Comments) Possibility-patient has never taken it  . Penicillins Other (See Comments)    As a child Possibility-patient has never taken it      Review of Systems  Constitutional: Negative for fatigue.  Eyes: Negative for visual disturbance.  Respiratory: Negative for cough, chest tightness, shortness of breath and wheezing.   Cardiovascular: Negative for chest pain, palpitations and leg swelling.  Endocrine: Negative for polydipsia and polyuria.  Neurological: Negative for dizziness, seizures, syncope, weakness and light-headedness.       Objective:   Physical Exam  Constitutional: She appears well-developed and well-nourished.  Neck: Neck supple. No thyromegaly present.  Cardiovascular: Normal rate and regular rhythm.   Pulmonary/Chest: Effort normal and breath sounds normal. No respiratory distress. She has no wheezes. She has no rales.  Musculoskeletal: She exhibits no edema.       Assessment:     Hypertension. Well controlled by today's reading here. Repeat right arm seated after rest 128/80    Plan:     -Patient has concerns about her cuff. We recommended one-month follow-up and bring her cuff and to compare with ours that time -Strongly encouraged to lose some weight -Keep sodium intake less than 3000 mg daily -Continue regular aerobic exercise  Eulas Post MD [facility] at [facility]

## 2016-09-23 NOTE — Progress Notes (Signed)
Pre visit review using our clinic review tool, if applicable. No additional management support is needed unless otherwise documented below in the visit note. 

## 2016-10-15 ENCOUNTER — Encounter: Payer: Self-pay | Admitting: Obstetrics & Gynecology

## 2016-10-16 ENCOUNTER — Other Ambulatory Visit: Payer: Self-pay | Admitting: Hematology & Oncology

## 2016-10-23 ENCOUNTER — Ambulatory Visit: Payer: BLUE CROSS/BLUE SHIELD | Admitting: Family

## 2016-10-23 ENCOUNTER — Other Ambulatory Visit: Payer: BLUE CROSS/BLUE SHIELD

## 2016-10-24 ENCOUNTER — Ambulatory Visit: Payer: Self-pay | Admitting: Family Medicine

## 2016-11-03 ENCOUNTER — Other Ambulatory Visit: Payer: BLUE CROSS/BLUE SHIELD

## 2016-11-03 ENCOUNTER — Ambulatory Visit: Payer: BLUE CROSS/BLUE SHIELD | Admitting: Family

## 2016-11-10 ENCOUNTER — Encounter: Payer: Self-pay | Admitting: Internal Medicine

## 2016-12-19 ENCOUNTER — Other Ambulatory Visit: Payer: Self-pay | Admitting: Family Medicine

## 2017-01-14 ENCOUNTER — Other Ambulatory Visit: Payer: Self-pay | Admitting: Hematology & Oncology

## 2017-02-11 DIAGNOSIS — D1801 Hemangioma of skin and subcutaneous tissue: Secondary | ICD-10-CM | POA: Diagnosis not present

## 2017-02-11 DIAGNOSIS — D485 Neoplasm of uncertain behavior of skin: Secondary | ICD-10-CM | POA: Diagnosis not present

## 2017-02-11 DIAGNOSIS — L570 Actinic keratosis: Secondary | ICD-10-CM | POA: Diagnosis not present

## 2017-02-11 DIAGNOSIS — L814 Other melanin hyperpigmentation: Secondary | ICD-10-CM | POA: Diagnosis not present

## 2017-02-11 DIAGNOSIS — D235 Other benign neoplasm of skin of trunk: Secondary | ICD-10-CM | POA: Diagnosis not present

## 2017-02-11 DIAGNOSIS — L700 Acne vulgaris: Secondary | ICD-10-CM | POA: Diagnosis not present

## 2017-02-11 DIAGNOSIS — Z85820 Personal history of malignant melanoma of skin: Secondary | ICD-10-CM | POA: Diagnosis not present

## 2017-02-19 ENCOUNTER — Other Ambulatory Visit: Payer: Self-pay | Admitting: *Deleted

## 2017-02-19 MED ORDER — VITAMIN D (ERGOCALCIFEROL) 1.25 MG (50000 UNIT) PO CAPS: ORAL_CAPSULE | ORAL | 6 refills | Status: DC

## 2017-02-19 MED ORDER — WARFARIN SODIUM 7.5 MG PO TABS: 7.5000 mg | ORAL_TABLET | Freq: Every day | ORAL | 2 refills | Status: DC

## 2017-04-14 ENCOUNTER — Other Ambulatory Visit: Payer: Self-pay | Admitting: Hematology & Oncology

## 2017-04-29 ENCOUNTER — Other Ambulatory Visit: Payer: Self-pay | Admitting: Family Medicine

## 2017-06-25 ENCOUNTER — Encounter: Payer: Self-pay | Admitting: Family Medicine

## 2017-08-29 ENCOUNTER — Other Ambulatory Visit: Payer: Self-pay | Admitting: Family

## 2017-10-28 ENCOUNTER — Telehealth: Payer: BLUE CROSS/BLUE SHIELD | Admitting: Family

## 2017-10-28 DIAGNOSIS — R05 Cough: Secondary | ICD-10-CM

## 2017-10-28 DIAGNOSIS — R059 Cough, unspecified: Secondary | ICD-10-CM

## 2017-10-28 MED ORDER — BENZONATATE 100 MG PO CAPS: 100.0000 mg | ORAL_CAPSULE | Freq: Three times a day (TID) | ORAL | 0 refills | Status: DC | PRN

## 2017-10-28 NOTE — Progress Notes (Signed)

## 2017-12-15 ENCOUNTER — Encounter (HOSPITAL_BASED_OUTPATIENT_CLINIC_OR_DEPARTMENT_OTHER): Payer: Self-pay | Admitting: Emergency Medicine

## 2017-12-15 ENCOUNTER — Other Ambulatory Visit: Payer: Self-pay

## 2017-12-15 ENCOUNTER — Ambulatory Visit: Payer: Self-pay

## 2017-12-15 ENCOUNTER — Emergency Department (HOSPITAL_BASED_OUTPATIENT_CLINIC_OR_DEPARTMENT_OTHER)
Admission: EM | Admit: 2017-12-15 | Discharge: 2017-12-15 | Disposition: A | Discharge status: Home / Self Care | Payer: 59 | Attending: Emergency Medicine | Admitting: Emergency Medicine

## 2017-12-15 DIAGNOSIS — Z79899 Other long term (current) drug therapy: Secondary | ICD-10-CM | POA: Insufficient documentation

## 2017-12-15 DIAGNOSIS — I10 Essential (primary) hypertension: Secondary | ICD-10-CM | POA: Insufficient documentation

## 2017-12-15 DIAGNOSIS — Z8543 Personal history of malignant neoplasm of ovary: Secondary | ICD-10-CM | POA: Insufficient documentation

## 2017-12-15 DIAGNOSIS — R42 Dizziness and giddiness: Secondary | ICD-10-CM | POA: Diagnosis not present

## 2017-12-15 DIAGNOSIS — Z86718 Personal history of other venous thrombosis and embolism: Secondary | ICD-10-CM | POA: Insufficient documentation

## 2017-12-15 LAB — BASIC METABOLIC PANEL
Anion gap: 8 (ref 5–15)
BUN: 12 mg/dL (ref 6–20)
CO2: 26 mmol/L (ref 22–32)
CREATININE: 0.72 mg/dL (ref 0.44–1.00)
Calcium: 9.1 mg/dL (ref 8.9–10.3)
Chloride: 105 mmol/L (ref 101–111)
GFR calc Af Amer: >60 mL/min (ref >60)
GFR calc non Af Amer: >60 mL/min (ref >60)
GLUCOSE: 98 mg/dL (ref 65–99)
POTASSIUM: 4 mmol/L (ref 3.5–5.1)
Sodium: 139 mmol/L (ref 135–145)

## 2017-12-15 LAB — CBC
HCT: 45.7 % (ref 36.0–46.0)
Hemoglobin: 15.3 g/dL — ABNORMAL HIGH (ref 12.0–15.0)
MCH: 30.3 pg (ref 26.0–34.0)
MCHC: 33.5 g/dL (ref 30.0–36.0)
MCV: 90.5 fL (ref 78.0–100.0)
Platelets: 290 10*3/uL (ref 150–400)
RBC: 5.05 MIL/uL (ref 3.87–5.11)
RDW: 14.2 % (ref 11.5–15.5)
WBC: 8.2 10*3/uL (ref 4.0–10.5)

## 2017-12-15 LAB — URINALYSIS, ROUTINE W REFLEX MICROSCOPIC
Bilirubin Urine: NEGATIVE
Glucose, UA: NEGATIVE mg/dL
Hgb urine dipstick: NEGATIVE
KETONES UR: NEGATIVE mg/dL
Leukocytes, UA: NEGATIVE
NITRITE: NEGATIVE
PH: 7 (ref 5.0–8.0)
Protein, ur: NEGATIVE mg/dL
Specific Gravity, Urine: <1.005 — ABNORMAL LOW (ref 1.005–1.030)

## 2017-12-15 LAB — PROTIME-INR
INR: 1.71
PROTHROMBIN TIME: 19.9 s — AB (ref 11.4–15.2)

## 2017-12-15 LAB — TROPONIN I: Troponin I: <0.03 ng/mL (ref <0.03)

## 2017-12-15 MED ORDER — MECLIZINE HCL 25 MG PO TABS
25.0000 mg | ORAL_TABLET | Freq: Once | ORAL | Status: AC
  Administered 2017-12-15: 25 mg via ORAL
  Filled 2017-12-15: qty 1

## 2017-12-15 MED ORDER — MECLIZINE HCL 25 MG PO TABS: 25.0000 mg | ORAL_TABLET | Freq: Three times a day (TID) | ORAL | 0 refills | Status: DC | PRN

## 2017-12-15 NOTE — ED Provider Notes (Signed)
[facility] Provider Note   CSN: [id number] Arrival date & time: 12/15/17  1256     History   Chief Complaint Chief Complaint  Patient presents with  . Dizziness    HPI Kimberly Monroe is a 57 y.o. female with PMH/o HTN, Depression, who presents for evaluation of dizziness that occurred earlier this morning. Patient reports that she was at a conference at work when she began feeling "funny." Patient states that she had to leave the room because of this and went to sit down in the next room. Patient states that then she became acutely dizzy stating she "felt like the room was spinning." She said she felt slightly nauseous initially but not vomiting. Patient reports that at that time, her BP was checked and it was 220/102. Patient states that she cannot recall if she took her blood pressure medication this morning. She states that she did not have any chest pain, diaphoresis, or difficulty breathing at this time. Patient states that symptoms have improved since initial ED arrival. She reports that she had one previous episode like this 1 week ago but did not seek evaluation. Patient states she has been compliant with her blood pressure medication. Patient is not a current smoker. Patient denies any fever, CP, SOB, abdominal pain, nausea/vomiting, diaphoresis, difficulty speaking, facial droop, vision changes, headache, numbness/weakness of her extremities.   The history is provided by the patient.    Past Medical History:  Diagnosis Date  . Allergy    seasonal  . Anemia   . Anxiety   . Basal cell cancer   . Depression   . History of blood clotting disorder   . Melanoma ([place]) 2009   insitu  . Menorrhagia 02/10/2013  . Migraine    history of/none in years  . Ovarian cancer ([place]) 1995   left ovary  . Pulmonary embolism ([place])   . Squamous cell carcinoma     Patient Active Problem List   Diagnosis Date Noted  . Essential hypertension 06/20/2016  .  Suicidal ideation 09/20/2013  . MDD (major depressive disorder) 09/20/2013  . Menorrhagia 02/10/2013  . Pulmonary embolism ([place]) 09/09/2012  . History of pulmonary embolism 09/06/2012  . Acute respiratory failure with hypoxia ([place]) 02/04/2012  . Bilateral pulmonary embolism ([place]) 02/04/2012  . Hypokalemia 02/04/2012  . Hyponatremia 02/04/2012  . Elevation of cardiac enzymes 02/04/2012  . Family History of hypercoagulable state 02/04/2012  . History of squamous cell carcinoma of skin 12/17/2011  . History  of basal cell carcinoma 12/17/2011  . Depression 12/17/2011  . Iron deficiency anemia - severe 12/17/2011  . Melanoma [facility])     Past Surgical History:  Procedure Laterality Date  . ABDOMINAL HYSTERECTOMY  03/06/2013  . BREAST DUCTAL SYSTEM EXCISION  2009   L intraductal papilloma  . [place]   twins, singleton  . laparoscopy with ovarian cystectomy  1994   ovarian torsion  . 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 MOHS SURGERY  2009   Dr Link Snuffer    OB History    Durenda Age Para Term Preterm AB Living   2 2 1 1   3    SAB TAB Ectopic Multiple Live Births         1         Home Medications    Prior to Admission medications   Medication Sig Start Date End Date Taking? Authorizing Provider  benzonatate (TESSALON PERLES) 100 MG capsule  Take 1 capsule (100 mg total) by mouth 3 (three) times daily as needed. 10/28/17   Evelina Dun A, FNP  lisinopril (PRINIVIL,ZESTRIL) 10 MG tablet TAKE 1 TABLET (10 MG TOTAL) BY MOUTH DAILY. 04/29/17   Burchette, Alinda Sierras, MD  meclizine (ANTIVERT) 25 MG tablet Take 1 tablet (25 mg total) by mouth 3 (three) times daily as needed for dizziness. 12/15/17   Volanda Napoleon, PA-C  Vitamin D, Ergocalciferol, (DRISDOL) 50000 units CAPS capsule TAKE ONE CAPSULE (50,000 UNITS TOTAL) BY MOUTH ONCE A WEEK. 04/14/17   Volanda Napoleon, MD  Vitamin D, Ergocalciferol, (DRISDOL) 50000 units CAPS capsule TAKE ONE CAPSULE BY MOUTH WEEKLY AS  ONE DOSE 08/31/17   [place], Holli Humbles, NP  warfarin (COUMADIN) 7.5 MG tablet Take 1 tablet (7.5 mg total) by mouth daily. 02/19/17   [place], Holli Humbles, NP    Family History Family History  Problem Relation Age of Onset  . Cancer Father 27       colon cancer  . Colon cancer Father   . Breast cancer Mother 69  . Cancer Paternal Uncle        prostate    Social History Social History   Tobacco Use  . Smoking status: Never Smoker  . Smokeless tobacco: Never Used  . Tobacco comment: never used cigarettes  Substance Use Topics  . Alcohol use: Yes    Alcohol/week: 0.0 oz    Comment: wine-occassionally  . Drug use: No     Allergies   Lime flavor [flavoring agent]; Amoxicillin; and Penicillins   Review of Systems Review of Systems  Constitutional: Negative for chills and fever.  Eyes: Negative for visual disturbance.  Respiratory: Negative for cough and shortness of breath.   Cardiovascular: Negative for chest pain.  Gastrointestinal: Positive for nausea. Negative for abdominal pain, diarrhea and vomiting.  Genitourinary: Negative for dysuria and hematuria.  Musculoskeletal: Negative for back pain and neck pain.  Skin: Negative for rash.  Neurological: Positive for dizziness. Negative for facial asymmetry, speech difficulty, weakness, numbness and headaches.  All other systems reviewed and are negative.    Physical Exam Updated Vital Signs BP 135/79   Pulse 60   Temp 98.1 F (36.7 C) (Oral)   Resp 18   Ht 5\' 4"  (1.626 m)   Wt 97.1 kg (214 lb)   LMP 01/24/2013   SpO2 96%   BMI 36.73 kg/m   Physical Exam  Constitutional: She is oriented to person, place, and time. She appears well-developed and well-nourished.  HENT:  Head: Normocephalic and atraumatic.  Mouth/Throat: Oropharynx is clear and moist and mucous membranes are normal.  Eyes: Conjunctivae, EOM and lids are normal. Pupils are equal, round, and reactive to light.  Slight horizontal nystagmus. No  vertical or rotational nystagmus.   Neck: Full passive range of motion without pain.  Cardiovascular: Normal rate, regular rhythm, normal heart sounds and normal pulses. Exam reveals no gallop and no friction rub.  No murmur heard. Pulmonary/Chest: Effort normal and breath sounds normal.  No evidence of respiratory distress. Able to speak in full sentences without difficulty.  Abdominal: Soft. Normal appearance. There is no tenderness. There is no rigidity and no guarding.  Abdomen is soft, non-distended, non-tender  Musculoskeletal: Normal range of motion.  Neurological: She is alert and oriented to person, place, and time.  Cranial nerves III-XII intact Follows commands, Moves all extremities  5/5 strength to BUE and BLE  Sensation intact throughout all major nerve distributions Normal finger to  nose. No dysdiadochokinesia. No pronator drift. No gait abnormalities  No slurred speech. No facial droop.  Normal gait Negative Rhomberg  Skin: Skin is warm and dry. Capillary refill takes less than 2 seconds.  Psychiatric: She has a normal mood and affect. Her speech is normal.  Nursing note and vitals reviewed.    ED Treatments / Results  Labs (all labs ordered are listed, but only abnormal results are displayed) Labs Reviewed  CBC - Abnormal; Notable for the following components:      Result Value   Hemoglobin 15.3 (*)    All other components within normal limits  URINALYSIS, ROUTINE W REFLEX MICROSCOPIC - Abnormal; Notable for the following components:   Specific Gravity, Urine <1.005 (*)    All other components within normal limits  PROTIME-INR - Abnormal; Notable for the following components:   Prothrombin Time 19.9 (*)    All other components within normal limits  BASIC METABOLIC PANEL  TROPONIN I    EKG  EKG Interpretation  Date/Time:  Tuesday December 15 2017 14:05:58 EDT Ventricular Rate:  70 PR Interval:  152 QRS Duration: 86 QT Interval:  400 QTC  Calculation: 432 R Axis:   9 Text Interpretation:  Normal sinus rhythm Low voltage QRS Borderline ECG No significant change since last tracing Confirmed by Theotis Burrow [phone]) on 12/15/2017 3:28:21 PM       Radiology No results found.  Procedures Procedures (including critical care time)  Medications Ordered in ED Medications  meclizine (ANTIVERT) tablet 25 mg (25 mg Oral Given 12/15/17 1440)     Initial Impression / Assessment and Plan / ED Course  I have reviewed the triage vital signs and the nursing notes.  Pertinent labs & imaging results that were available during my care of the patient were reviewed by me and considered in my medical decision making (see chart for details).     57 y.o. M with PMH/o HTN who presents for evaluation of dizziness that began today while at work. Now improved. No CP, SOB, fever, numbness/weakness, facial drooping. Reports BP was elevated when symptoms began. Patient is afebrile, non-toxic appearing, sitting comfortably on examination table. Vital signs reviewed and stable. She is still slightly hypertensive here in the ED. She is unsure if she took her BP meds today. On exam, no neuro deficits noted. She does state that some dizziness is evoked with EOMs. Consider dehydration vs vertigo vs anxiety vs cardiac etiology.  Also consider that this may be related to patient's blood pressure.  She does report that she has a history of hypertension and states that she sometimes forgets to take her medications.  Do not suspect CVA, TIA, ICH based on history/physical exam.  Plan to check basic labs, EKG. Will plan to give meclizine given that patient does have some slight horizontal nystagmus and was having a room spinning sensation. Discussed with Dr. Rex Kras who is agreeable to plan.   BMP unremarkable. CBC without any significant leukocytosis or anemia. UA negative for any acute infectious etiology. Trop negative.  Given that patient was not complaining of any  chest pain, one negative  troponin is sufficient.  EKG shows sinus rhythm, rate 70.   Discussed results with patient.  She reports improvement in symptoms after meclizine.  Repeat neuro exam shows no neuro deficits noted.  No nystagmus on exam noted.  We will plan to ambulate patient in the ED.  Patient ambulated to the bathroom without any difficulty, symptoms.  Vital signs are stable at  this time.  Blood pressure is improved.  I discussed with patient regarding her blood pressure and instructed her to follow-up with her primary care doctor in the next 24-48 hours for further evaluation to see if she needs additional meds.  Encouraged her to take her blood pressure medications every day and make a record so she does not forget.  We will plan to send home with a small course of meclizine to help with symptoms given that she did have some vertigo-like symptoms. Patient had ample opportunity for questions and discussion. All patient's questions were answered with full understanding. Strict return precautions discussed. Patient expresses understanding and agreement to plan.    Final Clinical Impressions(s) / ED Diagnoses   Final diagnoses:  Vertigo  Hypertension, unspecified type  Dizziness    ED Discharge Orders        Ordered    meclizine (ANTIVERT) 25 MG tablet  3 times daily PRN     12/15/17 1619       Volanda Napoleon, PA-C 12/16/17 0224    Little, Wenda Overland, MD 12/16/17 [phone]

## 2017-12-15 NOTE — Telephone Encounter (Signed)
Patient has arrived in ED.

## 2017-12-15 NOTE — ED Notes (Signed)
ED Provider at bedside. 

## 2017-12-15 NOTE — ED Triage Notes (Signed)
Patient states that she was at work and became acutely dizzy and almost passed out. She works with Dr and her BP was checked and it was elevated. SHe has had one other episode like this last week

## 2017-12-15 NOTE — Telephone Encounter (Addendum)
Dr. Nyra Capes at pt.'s job calling to report pt. Fainted last week. Today she had a near syncopal episode and they were able to get pt. To a chair before she could fall. While on the phone with triage nurse, pt. Fainted again and 911 was called at this point. Instructed Dr. Nyra Capes pt. Needs to transported to ED for evaluation. Reason for Disposition . [1] Fainted > 15 minutes ago AND [2] still feels too weak or dizzy to stand  Answer Assessment - Initial Assessment Questions 1. DESCRIPTION: "Describe your dizziness."     Dizzy 2. LIGHTHEADED: "Do you feel lightheaded?" (e.g., somewhat faint, woozy, weak upon standing)      Fainted at work last week 3. VERTIGO: "Do you feel like either you or the room is spinning or tilting?" (i.e. vertigo)     No 4. SEVERITY: "How bad is it?"  "Do you feel like you are going to faint?" "Can you stand and walk?"   - MILD - walking normally   - MODERATE - interferes with normal activities (e.g., work, school)    - SEVERE - unable to stand, requires support to walk, feels like passing out now.      Moderate 5. ONSET:  "When did the dizziness begin?"     Last week 6. AGGRAVATING FACTORS: "Does anything make it worse?" (e.g., standing, change in head position)     No 7. HEART RATE: "Can you tell me your heart rate?" "How many beats in 15 seconds?"  (Note: not all patients can do this)       n/a 8. CAUSE: "What do you think is causing the dizziness?"     Unsure 9. RECURRENT SYMPTOM: "Have you had dizziness before?" If so, ask: "When was the last time?" "What happened that time?"     *No Answer* 10. OTHER SYMPTOMS: "Do you have any other symptoms?" (e.g., fever, chest pain, vomiting, diarrhea, bleeding)       *No Answer* 11. PREGNANCY: "Is there any chance you are pregnant?" "When was your last menstrual period?"       *No Answer*  Protocols used: DIZZINESS Heidi Dach

## 2017-12-15 NOTE — ED Notes (Signed)
PT AMBULATED TO BATHROOM WITHOUT DIFFICULTY. PA (LINDSAY) NOTIFIED.

## 2017-12-15 NOTE — Discharge Instructions (Signed)
Take Meclizine as directed for dizziness.  As we discussed, you should follow-up with your primary care doctor in 24-48 hours for evaluation of your blood pressure and your medication.   Return to the emergency department for any repeat episodes of dizziness, chest pain, difficulty breathing, numbness/weakness of your arms or legs, difficulty speaking, slurred speech, facial drooping, difficulty walking or any other worsening or concerning symptoms.

## 2017-12-16 ENCOUNTER — Ambulatory Visit: Payer: 59 | Admitting: Family Medicine

## 2017-12-16 ENCOUNTER — Encounter: Payer: Self-pay | Admitting: Family Medicine

## 2017-12-16 ENCOUNTER — Telehealth: Payer: Self-pay | Admitting: Family Medicine

## 2017-12-16 VITALS — BP 130/90 | HR 66 | Temp 98.5°F | Wt 228.2 lb

## 2017-12-16 DIAGNOSIS — R42 Dizziness and giddiness: Secondary | ICD-10-CM

## 2017-12-16 NOTE — Patient Instructions (Signed)

## 2017-12-16 NOTE — Telephone Encounter (Signed)
I spoke with patient and she wants to know if her vertigo had anything to do with her  blood pressure increase yesterday?  Her blood pressure was "normal" in the office. Should she schedule a follow up appointment for her blood pressure?

## 2017-12-16 NOTE — Telephone Encounter (Signed)
As I explained during visit today, I do not think this had anything to do with increase in BP- other than possibly the stress of the event increasing BP...   Why don't we schedule follow-up in a couple weeks to reassess one more time but Fortunately her blood pressure was much better today

## 2017-12-16 NOTE — Progress Notes (Signed)
Subjective:     Patient ID: Kimberly Monroe, female   DOB: December 11, 1960, 57 y.o.   MRN: [id number]  HPI Patient seen for ER follow-up. She was seen there yesterday with dizziness. Apparently about a week ago she was at work and went from seated to standing and felt very dizzy and there was not any clear syncope but near syncopal type episode. She had episode yesterday where she felt the room was "spinning". She had some slight nausea but no vomiting. No headache. Her blood pressure at work was checked and 220/102. She was then referred to the ED for further evaluation. She does have some sensation of vertigo intermittently since then. Has not noted any focal weakness. No chest pains. No dyspnea. No slurred speech. No headaches. EKG was unremarkable. She had several labs including CBC, chemistries, troponin and urinalysis which were normal. She was prescribed meclizine and sent home.  Only took one dose of meclizine in emergency room but none since then  Her symptoms are relatively mild today. She still feels slightly off balance at times. Blood pressure much improved today. She does have hypertension treated with lisinopril.  Past Medical History:  Diagnosis Date  . Allergy    seasonal  . Anemia   . Anxiety   . Basal cell cancer   . Depression   . History of blood clotting disorder   . Melanoma ([place]) 2009   insitu  . Menorrhagia 02/10/2013  . Migraine    history of/none in years  . Ovarian cancer ([place]) 1995   left ovary  . Pulmonary embolism ([place])   . Squamous cell carcinoma    Past Surgical History:  Procedure Laterality Date  . ABDOMINAL HYSTERECTOMY  03/06/2013  . BREAST DUCTAL SYSTEM EXCISION  2009   L intraductal papilloma  . [place]   twins, singleton  . laparoscopy with ovarian cystectomy  1994   ovarian torsion  . 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 MOHS SURGERY  2009   Dr Link Snuffer    reports that  has never smoked. she has never used smokeless  tobacco. She reports that she drinks alcohol. She reports that she does not use drugs. family history includes Breast cancer (age of onset: 67) in her mother; Cancer in her paternal uncle; Cancer (age of onset: 39) in her father; Colon cancer in her father. Allergies  Allergen Reactions  . Lime Flavor [Flavoring Agent] Anaphylaxis  . Amoxicillin     Other reaction(s): Other (See Comments) Possibility-patient has never taken it  . Penicillins Other (See Comments)    As a child Possibility-patient has never taken it     Review of Systems  Constitutional: Negative for chills and fever.  Eyes: Negative for visual disturbance.  Respiratory: Negative for cough, chest tightness, shortness of breath and wheezing.   Cardiovascular: Negative for chest pain, palpitations and leg swelling.  Gastrointestinal: Negative for abdominal pain.  Genitourinary: Negative for dysuria.  Neurological: Positive for dizziness. Negative for seizures, weakness and headaches.  Psychiatric/Behavioral: Negative for confusion.       Objective:   Physical Exam  Constitutional: She is oriented to person, place, and time. She appears well-developed and well-nourished.  Eyes: Pupils are equal, round, and reactive to light.  Neck: Neck supple. No JVD present. No thyromegaly present.  Cardiovascular: Normal rate and regular rhythm. Exam reveals no gallop.  Pulmonary/Chest: Effort normal and breath sounds normal. No respiratory distress. She has no wheezes. She has no  rales.  Musculoskeletal: She exhibits no edema.  Neurological: She is alert and oriented to person, place, and time. No cranial nerve deficit. Coordination normal.  No focal weakness. Patient has reproducible vertigo when lying back with the head tilted to the right side but not the left. She does have some subtle horizontal nystagmus. No vertical nystagmus.       Assessment:     Patient presents with some recurrent vertigo type symptoms with head  tilted to the right-otherwise nonfocal neuro exam. 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  She's not had any focal neurologic symptoms such as focal weakness, speech disturbance, ataxia, etc.    Plan:     -Handout given with Epley maneuvers to the right -Set up physical therapy referral for some vestibular rehabilitation exercises -Follow-up immediately for any progressive symptoms or any new symptoms  Eulas Post MD [facility]

## 2017-12-16 NOTE — Telephone Encounter (Signed)
Copied from [place] [id number]. Topic: General - Other >> Dec 16, 2017  2:49 PM Synthia Innocent wrote: Reason for CRM:  Requesting call back from Dr Elease Hashimoto, would not state why she was requesting call.

## 2017-12-17 ENCOUNTER — Ambulatory Visit: Payer: Self-pay

## 2017-12-17 ENCOUNTER — Ambulatory Visit: Payer: 59 | Admitting: Family Medicine

## 2017-12-17 ENCOUNTER — Encounter: Payer: Self-pay | Admitting: Family Medicine

## 2017-12-17 VITALS — BP 120/82 | HR 63 | Temp 97.8°F | Ht 64.0 in | Wt 229.1 lb

## 2017-12-17 DIAGNOSIS — I10 Essential (primary) hypertension: Secondary | ICD-10-CM | POA: Diagnosis not present

## 2017-12-17 DIAGNOSIS — R42 Dizziness and giddiness: Secondary | ICD-10-CM | POA: Diagnosis not present

## 2017-12-17 MED ORDER — LISINOPRIL 20 MG PO TABS: 20.0000 mg | ORAL_TABLET | Freq: Every day | ORAL | 1 refills | Status: DC

## 2017-12-17 NOTE — Progress Notes (Signed)
HPI:  Using dictation device. Unfortunately this device frequently misinterprets words/phrases.  Kimberly Monroe is a pleasant 57 year old here for an acute visit about elevated blood pressure: -She has a diagnosis of hypertension and is on a low dose of lisinopril -Her blood pressure has been intermittently elevated at home -reports it was particularly elevated several days ago when EMS was to evaluate her for vertigo - she was evaluated in the emergency room recently and again by her primary care doctor yesterday in follow-up and is felt to have positional vertigo, she feels confident that is the diagnosis, as she has breif symptoms only triggered by turning her head to the right -PCP is treating this -Today, she is more concerned about her blood pressure -She reports labs and EKG were all okay in the hospital -Reports it is good sometimes at home, then other times it can be elevated in the 170s over 80s this morning -Denies chest pain, shortness of breath, dyspnea on exertion, headaches, vision changes, weakness or numbness, palpitations or swelling  ROS: See pertinent positives and negatives per HPI.  Past Medical History:  Diagnosis Date  . Allergy    seasonal  . Anemia   . Anxiety   . Basal cell cancer   . Depression   . History of blood clotting disorder   . Melanoma ([place]) 2009   insitu  . Menorrhagia 02/10/2013  . Migraine    history of/none in years  . Ovarian cancer ([place]) 1995   left ovary  . Pulmonary embolism ([place])   . Squamous cell carcinoma     Past Surgical History:  Procedure Laterality Date  . ABDOMINAL HYSTERECTOMY  03/06/2013  . BREAST DUCTAL SYSTEM EXCISION  2009   L intraductal papilloma  . [place]   twins, singleton  . laparoscopy with ovarian cystectomy  1994   ovarian torsion  . 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 MOHS SURGERY  2009   Dr Link Snuffer    Family History  Problem Relation Age of Onset  . Cancer Father 45        colon cancer  . Colon cancer Father   . Breast cancer Mother 29  . Cancer Paternal Uncle        prostate      Current Outpatient Medications:  .  lisinopril (PRINIVIL,ZESTRIL) 20 MG tablet, Take 1 tablet (20 mg total) by mouth daily., Disp: 30 tablet, Rfl: 1 .  meclizine (ANTIVERT) 25 MG tablet, Take 1 tablet (25 mg total) by mouth 3 (three) times daily as needed for dizziness., Disp: 30 tablet, Rfl: 0 .  Vitamin D, Ergocalciferol, (DRISDOL) 50000 units CAPS capsule, TAKE ONE CAPSULE (50,000 UNITS TOTAL) BY MOUTH ONCE A WEEK., Disp: 4 capsule, Rfl: 6 .  warfarin (COUMADIN) 7.5 MG tablet, Take 1 tablet (7.5 mg total) by mouth daily., Disp: 30 tablet, Rfl: 2  EXAM:  Vitals:   12/17/17 1133  BP: 120/82  Pulse: 63  Temp: 97.8 F (36.6 C)  SpO2: 96%    Body mass index is 39.32 kg/m.  GENERAL: vitals reviewed and listed above, alert, oriented, appears well hydrated and in no acute distress  HEENT: atraumatic, conjunttiva clear, no obvious abnormalities on inspection of external nose and ears  NECK: no obvious masses on inspection  LUNGS: clear to auscultation bilaterally, no wheezes, rales or rhonchi, good air movement  CV: HRRR, no peripheral edema  MS: moves all extremities without noticeable abnormality  PSYCH: Cranial nerves II  through XII are grossly intact, speech and thought processing are grossly intact, finger to nose is normal pleasant and cooperative, no obvious depression or anxiety  ASSESSMENT AND PLAN:  Discussed the following assessment and plan:  Essential hypertension  -Discussed the treatment of blood pressure at length, opted to increase the lisinopril slightly given her intermittently elevated blood pressure at home and her elevated diastolic blood pressure here  -Did check blood pressure with her cuff and-recheck here and her cuff is running about 10-15 points higher, but I did get a 140/80 on my check -She admits that her cuff may need new  batteries -Also discussed other ways to manage blood pressure including a healthy diet, regular aerobic exercise, weight reduction, etc. Discussed other causes of elevated blood pressure including alcohol, tobacco, NSAIDs, etc -Recommended a healthy low sugar diet, regular exercise and weight reduction -Offered MRI/MRA of the brain given the vertigo recently, though I agree that this is most likely BPPV and any other diagnosis is quite unlikely -she declined this testing today but will consider and will have close follow-up with her PCP -Follow-up in 1-2 weeks with PCP to recheck blood pressure -Patient advised to return or notify a doctor immediately if symptoms worsen or persist or new concerns arise.   She declined a patient handout today.  She thanked me for helping her today and for taking a long time with her at this visit. Over 25 minutes spent face to face in counseling. There are no Patient Instructions on file for this visit.  Lucretia Kern, DO

## 2017-12-17 NOTE — Telephone Encounter (Signed)
Patient has appointment today at 11:30am.

## 2017-12-17 NOTE — Telephone Encounter (Signed)
Pt. Concerned because first thing this morning her BP was 176/102. Recheck was 170/84. States she does feel lightheaded this morning. Saw Dr. Elease Hashimoto for follow up after an ED visit for vertigo and elevated BP. Pt. Requests to be seen today. Appointment made. Reason for Disposition . Systolic BP  >= 410 OR Diastolic >= 301  Answer Assessment - Initial Assessment Questions 1. BLOOD PRESSURE: "What is the blood pressure?" "Did you take at least two measurements 5 minutes apart?"     176/102 at 0715  2. ONSET: "When did you take your blood pressure?"     0715 3. HOW: "How did you obtain the blood pressure?" (e.g., visiting nurse, automatic home BP monitor)     Home BP monitor 4. HISTORY: "Do you have a history of high blood pressure?"     Yes 5. MEDICATIONS: "Are you taking any medications for blood pressure?" "Have you missed any doses recently?"     No missed doses 6. OTHER SYMPTOMS: "Do you have any symptoms?" (e.g., headache, chest pain, blurred vision, difficulty breathing, weakness)     Light headed 7. PREGNANCY: "Is there any chance you are pregnant?" "When was your last menstrual period?"     No  Protocols used: HIGH BLOOD PRESSURE-A-AH

## 2017-12-18 NOTE — Telephone Encounter (Signed)
Spoke with patient.

## 2017-12-24 DIAGNOSIS — R42 Dizziness and giddiness: Secondary | ICD-10-CM | POA: Diagnosis not present

## 2017-12-25 ENCOUNTER — Ambulatory Visit: Payer: 59 | Admitting: Family Medicine

## 2017-12-25 ENCOUNTER — Encounter: Payer: Self-pay | Admitting: Family Medicine

## 2017-12-25 ENCOUNTER — Encounter: Payer: Self-pay | Admitting: *Deleted

## 2017-12-25 VITALS — BP 110/64 | HR 75 | Temp 98.3°F | Wt 230.8 lb

## 2017-12-25 DIAGNOSIS — I10 Essential (primary) hypertension: Secondary | ICD-10-CM | POA: Diagnosis not present

## 2017-12-25 DIAGNOSIS — H81399 Other peripheral vertigo, unspecified ear: Secondary | ICD-10-CM

## 2017-12-25 DIAGNOSIS — R42 Dizziness and giddiness: Secondary | ICD-10-CM | POA: Diagnosis not present

## 2017-12-25 LAB — TSH: TSH: 3.08 u[IU]/mL (ref 0.35–4.50)

## 2017-12-25 NOTE — Progress Notes (Addendum)
Subjective:     Patient ID: Kimberly Monroe, female   DOB: [birth year], 57 y.o.   MRN: [id number]  HPI Patient is here to discuss blood pressure issues and ongoing dizziness.  Her blood pressure varies considerably. For example, blood pressure here today at check-in was 110/64 but she states yesterday at home this was up 177/108. She was here week ago to see another provider and blood pressure was stable that time and her machine was reading 10-15 points higher. She was at physical therapy yesterday and had reading of 160/90.  She had her lisinopril increased to 20 mg daily by another provider here a week ago. She is compliant with therapy. No regular exercise. No nonsteroidal use. No alcohol use. No recent headaches.  She is having ongoing dizziness. She had what sounded to [place] on visit here recently like benign peripheral positional vertigo. Her symptoms were clearly triggered with head to the right side. However, since then she seems to have some vertigo that seems to be more nondirectional and intermittent. She states she has lost her balance and fallen couple times. Denies any slurred speech. No visual changes. No focal weakness. No confusion. Does not see any clear correlation with dizziness and elevated blood pressure  She has had occasional "pulsatile" sensation temporal area.  No hearing loss.  No tinnitus.  Past Medical History:  Diagnosis Date  . Allergy    seasonal  . Anemia   . Anxiety   . Basal cell cancer   . Depression   . History of blood clotting disorder   . Melanoma ([place]) 2009   insitu  . Menorrhagia 02/10/2013  . Migraine    history of/none in years  . Ovarian cancer ([place]) 1995   left ovary  . Pulmonary embolism ([place])   . Squamous cell carcinoma    Past Surgical History:  Procedure Laterality Date  . ABDOMINAL HYSTERECTOMY  03/06/2013  . BREAST DUCTAL SYSTEM EXCISION  2009   L intraductal papilloma  . [place]   twins, singleton  . laparoscopy  with ovarian cystectomy  1994   ovarian torsion  . 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 MOHS SURGERY  2009   Dr Link Snuffer    reports that she has never smoked. She has never used smokeless tobacco. She reports that she drinks alcohol. She reports that she does not use drugs. family history includes Breast cancer (age of onset: 67) in her mother; Cancer in her paternal uncle; Cancer (age of onset: 51) in her father; Colon cancer in her father. Allergies  Allergen Reactions  . Lime Flavor [Flavoring Agent] Anaphylaxis  . Amoxicillin     Other reaction(s): Other (See Comments) Possibility-patient has never taken it  . Penicillins Other (See Comments)    As a child Possibility-patient has never taken it     Review of Systems  Constitutional: Negative for fatigue.  Eyes: Negative for visual disturbance.  Respiratory: Negative for cough, chest tightness, shortness of breath and wheezing.   Cardiovascular: Negative for chest pain, palpitations and leg swelling.  Neurological: Positive for dizziness. Negative for seizures, syncope, weakness, light-headedness and headaches.  Hematological: Negative for adenopathy.  Psychiatric/Behavioral: Negative for confusion.       Objective:   Physical Exam  Constitutional: She appears well-developed and well-nourished.  Eyes: Pupils are equal, round, and reactive to light.  Neck: Neck supple. No JVD present. No thyromegaly present.  Cardiovascular: Normal rate and regular rhythm. Exam reveals no  gallop.  Pulmonary/Chest: Effort normal and breath sounds normal. No respiratory distress. She has no wheezes. She has no rales.  Musculoskeletal: She exhibits no edema.  Neurological: She is alert.       Assessment:     #1 hypertension. Currently well-controlled but she's had several sporadic elevated readings.  #2 intermittent dizziness. Seems have combination of vertigo symptoms and some nonspecific dizziness    Plan:     -Continue current  blood pressure medication. Reluctant to add additional medication with current reading today 110/64 -Check TSH -Consider MRI/MRA to further assess.    Eulas Post MD [facility] at [facility]    Patient notified of MRA results.   MRI brain pending.  IMPRESSION: Intracranial atherosclerotic disease with a 75% stenosis of the RIGHT posterior cerebral artery and a 50-75% stenosis of the distal RIGHT vertebral artery.  No proximal flow-limiting stenosis of the internal carotid arteries, basilar artery, or M1 MCA segments.  Will set up neurology referral to get their input regarding her ongoing dizziness and balance issues.  Eulas Post MD [facility] at [facility]

## 2017-12-28 NOTE — Telephone Encounter (Signed)
Patient is calling and would like to know the status of this. She states if she needs to pay out of pocket then she will. Please advise.

## 2017-12-29 ENCOUNTER — Ambulatory Visit
Admission: RE | Admit: 2017-12-29 | Discharge: 2017-12-29 | Disposition: A | Discharge status: Home / Self Care | Payer: 59 | Source: Ambulatory Visit | Attending: Family Medicine | Admitting: Family Medicine

## 2017-12-29 DIAGNOSIS — H81399 Other peripheral vertigo, unspecified ear: Secondary | ICD-10-CM

## 2017-12-29 DIAGNOSIS — R42 Dizziness and giddiness: Secondary | ICD-10-CM | POA: Diagnosis not present

## 2017-12-29 NOTE — Addendum Note (Signed)
Addended by: Eulas Post on: 12/29/2017 05:08 PM   Modules accepted: Orders

## 2017-12-30 ENCOUNTER — Encounter: Payer: Self-pay | Admitting: Neurology

## 2017-12-31 ENCOUNTER — Telehealth: Payer: Self-pay | Admitting: Family Medicine

## 2017-12-31 NOTE — Telephone Encounter (Signed)
Copied from [place]. Topic: Inquiry >> Dec 31, 2017  8:32 AM Margot Ables wrote: Reason for CRM: pt requesting call to further discuss the MRI results and next steps. She states [facility] cannot see her for 2 1/2 months and she doesn't think she should wait this long. Please contact her asap.

## 2018-01-01 NOTE — Telephone Encounter (Signed)
I called the pt and informed her our referral coordinator sent the referral to [facility].  Patient stated she does not want to be seen at [facility] and would like to be seen at [facility] instead.  Patient stated she wanted to have Dr Elease Hashimoto only call her to go over the results as when she spoke to him initially she was not prepared and has more questions.  Message sent to Dr Elease Hashimoto and back to [employer].

## 2018-01-01 NOTE — Telephone Encounter (Signed)
I spoke with patient.  She would like to proceed with sending referral to [facility].   Be sure to send MRA report.

## 2018-01-01 NOTE — Telephone Encounter (Signed)
I Sent a msg to Dr Elease Hashimoto  To inform him of [facility] referral process  process I called [facility] to referral the pt as she requested.  I was told by Abigail Butts the [place] it would take 2 -7 months or longer to schedule  And 5-7 business day to review if the patient is approved. The office will call the patient to schedule directly , and if not they will send the doctor a fax explaining why. I also was informed that all office notes including any work - up from any other office have to be faxed. If it is a stat request once the records are faxed, they will page the doctor on call, and he would determine after reviewing the pt records if the pt could have an urgent appt. Abigail Butts the scheduler also mentioned that the referring doctor has to be very specific in his notes of what type of treatment that patient is to have and why so they can match the patient up with the correct doctor.  If the patient is approved to be seen by the clinic, they will call the patient to schedule. If not they will inform the patient and send the doctor fax explaining why

## 2018-01-01 NOTE — Telephone Encounter (Signed)
Copied from [place]. Topic: Inquiry >> Dec 31, 2017  8:32 AM Margot Ables wrote: Reason for CRM: pt requesting call to further discuss the MRI results and next steps. She states [facility] cannot see her for 2 1/2 months and she doesn't think she should wait this long. Please contact her asap. >> Dec 31, 2017  4:51 PM Percell Belt A wrote: Pt called again about this.  Would like a call back asap  >> Jan 01, 2018 10:19 AM Percell Belt A wrote: Pt called and said that she has an expectation  of a nurse or dr calling her today about her MRI results.  She wants to be referred to to only :  Address: [address], [place], [place] [address]   [phone]    Pt best number [phone]

## 2018-01-01 NOTE — Telephone Encounter (Signed)
I agree that we should not wait this long.  Let's see if we can get in to see someone with [facility] sooner.

## 2018-01-01 NOTE — Telephone Encounter (Signed)
Message sent to Neoma Laming to call [facility] for an appt.

## 2018-01-01 NOTE — Telephone Encounter (Signed)
Copied from [place]. Topic: Inquiry >> Dec 31, 2017  4:51 PM Kimberly Monroe wrote: Pt called again about this.  Would like Monroe call back asap

## 2018-01-06 ENCOUNTER — Telehealth: Payer: Self-pay | Admitting: Family Medicine

## 2018-01-06 ENCOUNTER — Telehealth: Payer: Self-pay | Admitting: *Deleted

## 2018-01-06 DIAGNOSIS — R42 Dizziness and giddiness: Secondary | ICD-10-CM

## 2018-01-06 MED ORDER — LORAZEPAM 1 MG PO TABS: ORAL_TABLET | ORAL | 0 refills | Status: DC

## 2018-01-06 NOTE — Telephone Encounter (Signed)
done

## 2018-01-06 NOTE — Telephone Encounter (Signed)
Placed in Dr Burchette's folder 

## 2018-01-06 NOTE — Telephone Encounter (Signed)
Ok referral was sent awaiting appt

## 2018-01-06 NOTE — Telephone Encounter (Signed)
Patient walked in requesting medication to help to get through her MRI.  Ativan 1 mg 1 hour prior to procedure #1 with no refills. Per Dr Elease Hashimoto.  Patient is aware that she will have to have someone drive her to her appointment.  Rx called in.

## 2018-01-06 NOTE — Telephone Encounter (Signed)
Patient dropped off FMLA forms  Call for pick up at: [phone]  Disposition: Dr's Folder

## 2018-01-07 NOTE — Telephone Encounter (Signed)
Pt would like to pick up her FMLA papers as soon as she can.  Please call when you locate.

## 2018-01-08 NOTE — Telephone Encounter (Signed)
Patient is aware that form is ready for pick up.  Copy made for file.  Patient is aware of fee.

## 2018-01-08 NOTE — Telephone Encounter (Signed)
Patient picked up forms and paid the $29 form fee

## 2018-01-11 ENCOUNTER — Telehealth: Payer: Self-pay | Admitting: Family Medicine

## 2018-01-11 ENCOUNTER — Ambulatory Visit
Admission: RE | Admit: 2018-01-11 | Discharge: 2018-01-11 | Disposition: A | Discharge status: Home / Self Care | Payer: 59 | Source: Ambulatory Visit | Attending: Family Medicine | Admitting: Family Medicine

## 2018-01-11 DIAGNOSIS — R42 Dizziness and giddiness: Secondary | ICD-10-CM | POA: Diagnosis not present

## 2018-01-11 DIAGNOSIS — R930 Abnormal findings on diagnostic imaging of skull and head, not elsewhere classified: Secondary | ICD-10-CM

## 2018-01-11 DIAGNOSIS — R55 Syncope and collapse: Secondary | ICD-10-CM | POA: Diagnosis not present

## 2018-01-11 NOTE — Telephone Encounter (Signed)
Patient is aware of MRI result.  Per Dr Volanda Napoleon okay to order MRI with contrast of the brain.  Patient is aware and agrees to go forward.  Order placed.

## 2018-01-11 NOTE — Telephone Encounter (Signed)
Call report on MRI, please ask provider to review.

## 2018-01-11 NOTE — Telephone Encounter (Signed)
Pt returning call to [facility] for her MRI results. Apolonio Schneiders unavailable at this time.

## 2018-01-11 NOTE — Telephone Encounter (Signed)
Left message on machine for patient to return our call 

## 2018-01-12 ENCOUNTER — Telehealth: Payer: Self-pay | Admitting: Family Medicine

## 2018-01-12 NOTE — Telephone Encounter (Signed)
Ok to prescribe one pill of Ativan 0.5 mg - take 30 min before MRI.   She needs to have a driver with her

## 2018-01-12 NOTE — Telephone Encounter (Signed)
Copied from [place] [phone]. Topic: Inquiry >> Jan 12, 2018 12:24 PM Margot Ables wrote: Reason for CRM: pt is scheduled for an MRI 01/14/18 at 3:10pm. Last MRI she had Dr. Elease Hashimoto prescribed lorazepam to take 1 hour prior to procedure. With Dr. Elease Hashimoto being out of the office pt is requesting another provider to prescribe 1 dose for her to take before MRI 01/14/18. Please call pt to notify.  [id number] - [place], [place] (Phone) [phone] (Fax)

## 2018-01-12 NOTE — Telephone Encounter (Signed)
Okay to send in 1 tablet?

## 2018-01-13 ENCOUNTER — Other Ambulatory Visit: Payer: Self-pay | Admitting: Family Medicine

## 2018-01-13 MED ORDER — LORAZEPAM 0.5 MG PO TABS: ORAL_TABLET | ORAL | 0 refills | Status: DC

## 2018-01-13 NOTE — Telephone Encounter (Signed)
Rx called in. Patient is aware. 

## 2018-01-13 NOTE — Telephone Encounter (Signed)
Pt calling back about a RX for Ativan to take for her MRI tomorrow

## 2018-01-14 ENCOUNTER — Ambulatory Visit
Admission: RE | Admit: 2018-01-14 | Discharge: 2018-01-14 | Disposition: A | Discharge status: Home / Self Care | Payer: 59 | Source: Ambulatory Visit | Attending: Family Medicine | Admitting: Family Medicine

## 2018-01-14 DIAGNOSIS — R930 Abnormal findings on diagnostic imaging of skull and head, not elsewhere classified: Secondary | ICD-10-CM

## 2018-01-14 DIAGNOSIS — R42 Dizziness and giddiness: Secondary | ICD-10-CM | POA: Diagnosis not present

## 2018-01-14 MED ORDER — GADOXETATE DISODIUM 0.25 MMOL/ML IV SOLN: 5.0000 mL | Freq: Once | INTRAVENOUS | Status: DC | PRN

## 2018-01-14 MED ORDER — GADOBENATE DIMEGLUMINE 529 MG/ML IV SOLN
20.0000 mL | Freq: Once | INTRAVENOUS | Status: AC | PRN
  Administered 2018-01-14: 20 mL via INTRAVENOUS

## 2018-01-15 ENCOUNTER — Telehealth: Payer: Self-pay | Admitting: Family Medicine

## 2018-01-15 ENCOUNTER — Other Ambulatory Visit: Payer: Self-pay | Admitting: Adult Health

## 2018-01-15 NOTE — Telephone Encounter (Unsigned)
Copied from [place] [phone]. Topic: Quick Communication - See Telephone Encounter >> Jan 15, 2018 12:43 PM Neva Seat wrote: Pt calling to check on when the recent MRI results can be released. Pt wants to know something before the weekend.

## 2018-01-15 NOTE — Telephone Encounter (Signed)
Copied from [place] [phone]. Topic: Quick Communication - See Telephone Encounter >> Jan 15, 2018 12:43 PM Neva Seat wrote: Pt calling to check on when the recent MRI results can be released. Pt wants to know something before the weekend.    >> Jan 15, 2018  2:25 PM Cleaster Corin, NT wrote: Pt calling to check on when the recent MRI results can be released. Pt wants to know something before the weekend.

## 2018-01-17 NOTE — Telephone Encounter (Signed)
I was out of town all last week and did not have access to Epic until this AM.  I will call patient Monday morning to discuss.

## 2018-01-18 ENCOUNTER — Other Ambulatory Visit (INDEPENDENT_AMBULATORY_CARE_PROVIDER_SITE_OTHER): Payer: 59

## 2018-01-18 ENCOUNTER — Other Ambulatory Visit: Payer: Self-pay | Admitting: Family Medicine

## 2018-01-18 DIAGNOSIS — I10 Essential (primary) hypertension: Secondary | ICD-10-CM

## 2018-01-18 LAB — LIPID PANEL
CHOLESTEROL: 208 mg/dL — AB (ref 0–200)
HDL: 40.5 mg/dL (ref >39.00)
LDL Cholesterol: 137 mg/dL — ABNORMAL HIGH (ref 0–99)
NONHDL: 167.5
Total CHOL/HDL Ratio: 5
Triglycerides: 155 mg/dL — ABNORMAL HIGH (ref 0.0–149.0)
VLDL: 31 mg/dL (ref 0.0–40.0)

## 2018-01-18 NOTE — Progress Notes (Signed)
Reviewed results of recent MRI of brain with patient. 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  She has pending follow-up with neurology at [facility]. We have recommended she get follow-up fasting lipid panel will recommend treating LDL to goal of less than 70

## 2018-01-19 ENCOUNTER — Other Ambulatory Visit: Payer: Self-pay | Admitting: Family Medicine

## 2018-01-19 DIAGNOSIS — E785 Hyperlipidemia, unspecified: Secondary | ICD-10-CM

## 2018-01-19 MED ORDER — ATORVASTATIN CALCIUM 20 MG PO TABS: 20.0000 mg | ORAL_TABLET | Freq: Every day | ORAL | 3 refills | Status: DC

## 2018-01-20 ENCOUNTER — Other Ambulatory Visit: Payer: Self-pay | Admitting: Family Medicine

## 2018-01-20 DIAGNOSIS — I639 Cerebral infarction, unspecified: Secondary | ICD-10-CM

## 2018-01-21 ENCOUNTER — Other Ambulatory Visit: Payer: Self-pay

## 2018-01-21 ENCOUNTER — Ambulatory Visit (HOSPITAL_COMMUNITY): Payer: 59 | Attending: Cardiology

## 2018-01-21 DIAGNOSIS — Z86711 Personal history of pulmonary embolism: Secondary | ICD-10-CM | POA: Diagnosis not present

## 2018-01-21 DIAGNOSIS — I639 Cerebral infarction, unspecified: Secondary | ICD-10-CM | POA: Insufficient documentation

## 2018-01-21 DIAGNOSIS — F419 Anxiety disorder, unspecified: Secondary | ICD-10-CM | POA: Diagnosis not present

## 2018-01-21 DIAGNOSIS — D649 Anemia, unspecified: Secondary | ICD-10-CM | POA: Insufficient documentation

## 2018-01-21 DIAGNOSIS — I77810 Thoracic aortic ectasia: Secondary | ICD-10-CM | POA: Diagnosis not present

## 2018-02-01 ENCOUNTER — Telehealth: Payer: Self-pay | Admitting: Family Medicine

## 2018-02-01 ENCOUNTER — Encounter: Payer: Self-pay | Admitting: *Deleted

## 2018-02-01 NOTE — Telephone Encounter (Signed)
Left message on machine for patient returning her call.  Left message on mychart with Dr Burchette's advise.

## 2018-02-01 NOTE — Telephone Encounter (Signed)
Radiologist stated that subacute or chronic infarction was "likely"   That is one reason we are getting neurology input to get their opinion   It is sometimes not easy to be definitive with imaging.

## 2018-02-01 NOTE — Telephone Encounter (Signed)
Copied from [place] [phone]. Topic: Inquiry >> Feb 01, 2018  9:37 AM Conception Chancy, NT wrote: Reason for CRM: patient is calling and states she was looking on mychart and states that it says under ECHO cardiogram it shows CVA was 163.9. 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 Please contact patient. >> Feb 01, 2018  2:38 PM Oneta Rack wrote: Relation to pt: self  Call back [place]:  Reason for call:  Patient returning call, patient was advised nurse would call at the end of the business day.

## 2018-02-01 NOTE — Telephone Encounter (Signed)
Patient is concerned because the diagnosis for MRI was CVA.  She is concerned because she will need to file her insurance.  If she did not have a CVA she would like it removed from her chart.  Please advise.    It was explained to the patient that she needs to see the neurologist.  Patient states that the earliest appointment she can get is in July.

## 2018-02-01 NOTE — Telephone Encounter (Signed)
Copied from [place] [phone]. Topic: Inquiry >> Feb 01, 2018  9:37 AM Conception Chancy, NT wrote: Reason for CRM: patient is calling and states she was looking on mychart and states that it says under ECHO cardiogram it shows CVA was 163.9. 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 Please contact patient.

## 2018-02-02 NOTE — Telephone Encounter (Signed)
HI Dr. Elease Hashimoto,  You could change to abnormal MRI of brain, or you could use one of her symptoms that she was having.  You will have to send over to where she had the Echo, probably in writing , ask them to correct and re-file to her insurance company.  I can not change since it is not [place].   Hope this helps.  Dawn

## 2018-02-06 NOTE — Telephone Encounter (Signed)
We are contacting folks in [place] to see if diagnosis can be changed.

## 2018-02-09 ENCOUNTER — Other Ambulatory Visit: Payer: Self-pay | Admitting: Family Medicine

## 2018-02-09 NOTE — Telephone Encounter (Signed)
Refill for one year 

## 2018-02-11 NOTE — Telephone Encounter (Signed)
Rx done. 

## 2018-02-19 ENCOUNTER — Telehealth: Payer: 59 | Admitting: Family

## 2018-02-19 DIAGNOSIS — J029 Acute pharyngitis, unspecified: Secondary | ICD-10-CM

## 2018-02-19 MED ORDER — BENZONATATE 100 MG PO CAPS: 100.0000 mg | ORAL_CAPSULE | Freq: Three times a day (TID) | ORAL | 0 refills | Status: DC | PRN

## 2018-02-19 NOTE — Progress Notes (Signed)
Thank you for the details you included in the comment boxes. Those details are very helpful in determining the best course of treatment for you and help us to provide the best care.  We are sorry that you are not feeling well.  Here is how we plan to help!  Based on your presentation I believe you most likely have A cough due to a virus.  This is called viral bronchitis and is best treated by rest, plenty of fluids and control of the cough.  You may use Ibuprofen or Tylenol as directed to help your symptoms.     In addition you may use A non-prescription cough medication called Mucinex DM: take 2 tablets every 12 hours. and A prescription cough medication called Tessalon Perles 100mg. You may take 1-2 capsules every 8 hours as needed for your cough.    From your responses in the eVisit questionnaire you describe inflammation in the upper respiratory tract which is causing a significant cough.  This is commonly called Bronchitis and has four common causes:    Allergies  Viral Infections  Acid Reflux  Bacterial Infection Allergies, viruses and acid reflux are treated by controlling symptoms or eliminating the cause. An example might be a cough caused by taking certain blood pressure medications. You stop the cough by changing the medication. Another example might be a cough caused by acid reflux. Controlling the reflux helps control the cough.  USE OF BRONCHODILATOR ("RESCUE") INHALERS: There is a risk from using your bronchodilator too frequently.  The risk is that over-reliance on a medication which only relaxes the muscles surrounding the breathing tubes can reduce the effectiveness of medications prescribed to reduce swelling and congestion of the tubes themselves.  Although you feel brief relief from the bronchodilator inhaler, your asthma may actually be worsening with the tubes becoming more swollen and filled with mucus.  This can delay other crucial treatments, such as oral steroid  medications. If you need to use a bronchodilator inhaler daily, several times per day, you should discuss this with your provider.  There are probably better treatments that could be used to keep your asthma under control.     HOME CARE . Only take medications as instructed by your medical team. . Complete the entire course of an antibiotic. . Drink plenty of fluids and get plenty of rest. . Avoid close contacts especially the very young and the elderly . Cover your mouth if you cough or cough into your sleeve. . Always remember to wash your hands . A steam or ultrasonic humidifier can help congestion.   GET HELP RIGHT AWAY IF: . You develop worsening fever. . You become short of breath . You cough up blood. . Your symptoms persist after you have completed your treatment plan MAKE SURE YOU   Understand these instructions.  Will watch your condition.  Will get help right away if you are not doing well or get worse.  Your e-visit answers were reviewed by a board certified advanced clinical practitioner to complete your personal care plan.  Depending on the condition, your plan could have included both over the counter or prescription medications. If there is a problem please reply  once you have received a response from your provider. Your safety is important to us.  If you have drug allergies check your prescription carefully.    You can use MyChart to ask questions about today's visit, request a non-urgent call back, or ask for a work or school excuse   for 24 hours related to this e-Visit. If it has been greater than 24 hours you will need to follow up with your provider, or enter a new e-Visit to address those concerns. You will get an e-mail in the next two days asking about your experience.  I hope that your e-visit has been valuable and will speed your recovery. Thank you for using e-visits.   

## 2018-02-20 DIAGNOSIS — J029 Acute pharyngitis, unspecified: Secondary | ICD-10-CM | POA: Diagnosis not present

## 2018-02-20 DIAGNOSIS — J069 Acute upper respiratory infection, unspecified: Secondary | ICD-10-CM | POA: Diagnosis not present

## 2018-02-22 ENCOUNTER — Other Ambulatory Visit (INDEPENDENT_AMBULATORY_CARE_PROVIDER_SITE_OTHER): Payer: 59

## 2018-02-22 DIAGNOSIS — E785 Hyperlipidemia, unspecified: Secondary | ICD-10-CM

## 2018-02-22 LAB — HEPATIC FUNCTION PANEL
ALT: 38 U/L — ABNORMAL HIGH (ref 0–35)
AST: 24 U/L (ref 0–37)
Albumin: 3.7 g/dL (ref 3.5–5.2)
Alkaline Phosphatase: 89 U/L (ref 39–117)
BILIRUBIN DIRECT: 0.1 mg/dL (ref 0.0–0.3)
BILIRUBIN TOTAL: 0.5 mg/dL (ref 0.2–1.2)
Total Protein: 6.5 g/dL (ref 6.0–8.3)

## 2018-02-22 LAB — LIPID PANEL
Cholesterol: 118 mg/dL (ref 0–200)
HDL: 35 mg/dL — AB (ref >39.00)
LDL CALC: 61 mg/dL (ref 0–99)
NonHDL: 83.31
TRIGLYCERIDES: 110 mg/dL (ref 0.0–149.0)
Total CHOL/HDL Ratio: 3
VLDL: 22 mg/dL (ref 0.0–40.0)

## 2018-03-08 ENCOUNTER — Other Ambulatory Visit: Payer: Self-pay | Admitting: Family Medicine

## 2018-03-11 DIAGNOSIS — I639 Cerebral infarction, unspecified: Secondary | ICD-10-CM | POA: Diagnosis not present

## 2018-03-11 DIAGNOSIS — G450 Vertebro-basilar artery syndrome: Secondary | ICD-10-CM | POA: Diagnosis not present

## 2018-03-11 DIAGNOSIS — R42 Dizziness and giddiness: Secondary | ICD-10-CM | POA: Diagnosis not present

## 2018-03-18 ENCOUNTER — Ambulatory Visit: Payer: Self-pay | Admitting: Neurology

## 2018-03-23 ENCOUNTER — Telehealth: Payer: Self-pay | Admitting: Family Medicine

## 2018-03-23 NOTE — Telephone Encounter (Signed)
Patient dropped off Critical Illness form  Call patient to pick up form at: [phone]  Disposition: Dr's Folder

## 2018-03-24 ENCOUNTER — Telehealth: Payer: Self-pay | Admitting: Family Medicine

## 2018-03-24 NOTE — Telephone Encounter (Signed)
Copied from [place] [phone]. Topic: Quick Communication - See Telephone Encounter >> Mar 24, 2018  1:00 PM Selinda Flavin B, NT wrote: CRM for notification. See Telephone encounter for: 03/24/18. Patient calling to see if the office received a fax from [facility]. 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 Patient states that she needs a copy of this to be able to file in a timely manor. States there is also paperwork for critical illness to have filled out. Would like a call when this is completed. CB#: [phone]

## 2018-03-24 NOTE — Telephone Encounter (Signed)
Placed in Dr Burchette's folder 

## 2018-03-26 NOTE — Telephone Encounter (Signed)
Placed in Dr Burchette's folder 

## 2018-03-26 NOTE — Telephone Encounter (Signed)
Let her know I am working on this.

## 2018-03-29 ENCOUNTER — Encounter: Payer: Self-pay | Admitting: Family Medicine

## 2018-03-29 ENCOUNTER — Other Ambulatory Visit: Payer: Self-pay | Admitting: *Deleted

## 2018-03-29 DIAGNOSIS — I639 Cerebral infarction, unspecified: Secondary | ICD-10-CM | POA: Insufficient documentation

## 2018-03-29 NOTE — Telephone Encounter (Signed)
Patient is aware and copy made for chart.

## 2018-03-29 NOTE — Telephone Encounter (Signed)
done

## 2018-03-30 NOTE — Telephone Encounter (Signed)
Patient picked up paperwork today 03/30/2018 at 10:23.

## 2018-04-02 ENCOUNTER — Other Ambulatory Visit: Payer: Self-pay | Admitting: Family

## 2018-04-12 NOTE — Telephone Encounter (Signed)
Patient need a call back about the forms that were completed. [facility] faxed twice and never received anything back. There is aa additional form being faxed today from critical illness.

## 2018-04-13 NOTE — Telephone Encounter (Signed)
Spoke with patient and [place] has sent the request for records 04/12/18.

## 2018-04-16 ENCOUNTER — Telehealth: Payer: Self-pay | Admitting: Family Medicine

## 2018-04-16 NOTE — Telephone Encounter (Signed)
Copied from [place] [phone]. Topic: Quick Communication - Rx Refill/Question >> Apr 16, 2018 12:15 PM Neva Seat wrote: Pt needing a call back regarding the papers for critical illness insurance and status.  How long will it take and where are they in the process?

## 2018-04-16 NOTE — Telephone Encounter (Signed)
Spoke with patient, advised that our office has not received any records.  Pt given [facility]'s number who handles our medical records and advised to call them for an update.  Nothing further needed.

## 2018-06-09 ENCOUNTER — Other Ambulatory Visit: Payer: Self-pay | Admitting: Family

## 2018-06-09 DIAGNOSIS — E559 Vitamin D deficiency, unspecified: Secondary | ICD-10-CM

## 2018-06-09 DIAGNOSIS — D508 Other iron deficiency anemias: Secondary | ICD-10-CM

## 2018-06-09 DIAGNOSIS — Z86711 Personal history of pulmonary embolism: Secondary | ICD-10-CM

## 2018-06-10 ENCOUNTER — Encounter: Payer: Self-pay | Admitting: Family

## 2018-06-10 ENCOUNTER — Inpatient Hospital Stay (HOSPITAL_BASED_OUTPATIENT_CLINIC_OR_DEPARTMENT_OTHER): Payer: 59 | Admitting: Family

## 2018-06-10 ENCOUNTER — Inpatient Hospital Stay: Payer: 59 | Attending: Hematology & Oncology

## 2018-06-10 ENCOUNTER — Other Ambulatory Visit: Payer: Self-pay

## 2018-06-10 VITALS — BP 133/80 | HR 58 | Temp 97.7°F | Resp 18 | Wt 210.0 lb

## 2018-06-10 DIAGNOSIS — Z7901 Long term (current) use of anticoagulants: Secondary | ICD-10-CM

## 2018-06-10 DIAGNOSIS — I639 Cerebral infarction, unspecified: Secondary | ICD-10-CM

## 2018-06-10 DIAGNOSIS — Z8673 Personal history of transient ischemic attack (TIA), and cerebral infarction without residual deficits: Secondary | ICD-10-CM | POA: Diagnosis not present

## 2018-06-10 DIAGNOSIS — Z86711 Personal history of pulmonary embolism: Secondary | ICD-10-CM

## 2018-06-10 DIAGNOSIS — E559 Vitamin D deficiency, unspecified: Secondary | ICD-10-CM

## 2018-06-10 DIAGNOSIS — D508 Other iron deficiency anemias: Secondary | ICD-10-CM

## 2018-06-10 LAB — CBC WITH DIFFERENTIAL/PLATELET
Basophils Absolute: 0 10*3/uL (ref 0.0–0.1)
Basophils Relative: 1 %
EOS PCT: 3 %
Eosinophils Absolute: 0.2 10*3/uL (ref 0.0–0.7)
HEMATOCRIT: 47.3 % — AB (ref 36.0–46.0)
Hemoglobin: 15.8 g/dL — ABNORMAL HIGH (ref 12.0–15.0)
Lymphocytes Relative: 33 %
Lymphs Abs: 2.1 10*3/uL (ref 0.7–4.0)
MCH: 30.2 pg (ref 26.0–34.0)
MCHC: 33.4 g/dL (ref 30.0–36.0)
MCV: 90.3 fL (ref 78.0–100.0)
MONO ABS: 0.4 10*3/uL (ref 0.1–1.0)
Monocytes Relative: 7 %
NEUTROS ABS: 3.6 10*3/uL (ref 1.7–7.7)
Neutrophils Relative %: 56 %
PLATELETS: 228 10*3/uL (ref 150–400)
RBC: 5.24 MIL/uL — ABNORMAL HIGH (ref 3.87–5.11)
RDW: 14.2 % (ref 11.5–15.5)
WBC: 6.2 10*3/uL (ref 4.0–10.5)

## 2018-06-10 LAB — CMP (CANCER CENTER ONLY)
ALBUMIN: 3.8 g/dL (ref 3.5–5.0)
ALT: 38 U/L (ref 0–44)
ANION GAP: 11 (ref 5–15)
AST: 33 U/L (ref 15–41)
Alkaline Phosphatase: 99 U/L (ref 38–126)
BUN: 18 mg/dL (ref 6–20)
CO2: 26 mmol/L (ref 22–32)
CREATININE: 0.81 mg/dL (ref 0.44–1.00)
Calcium: 10.1 mg/dL (ref 8.9–10.3)
Chloride: 105 mmol/L (ref 98–111)
GFR, Est AFR Am: >60 mL/min (ref >60)
GFR, Estimated: >60 mL/min (ref >60)
GLUCOSE: 100 mg/dL — AB (ref 70–99)
POTASSIUM: 4.3 mmol/L (ref 3.5–5.1)
SODIUM: 142 mmol/L (ref 135–145)
Total Bilirubin: 0.6 mg/dL (ref 0.3–1.2)
Total Protein: 7.2 g/dL (ref 6.5–8.1)

## 2018-06-10 LAB — PROTIME-INR
INR: 1.41
Prothrombin Time: 17.2 seconds — ABNORMAL HIGH (ref 11.4–15.2)

## 2018-06-10 LAB — FERRITIN: Ferritin: 94 ng/mL (ref 11–307)

## 2018-06-10 LAB — IRON AND TIBC
IRON: 101 ug/dL (ref 41–142)
Saturation Ratios: 34 % (ref 21–57)
TIBC: 299 ug/dL (ref 236–444)
UIBC: 198 ug/dL

## 2018-06-10 MED ORDER — RIVAROXABAN 20 MG PO TABS: 20.0000 mg | ORAL_TABLET | Freq: Every day | ORAL | 6 refills | Status: DC

## 2018-06-10 NOTE — Progress Notes (Signed)
Hematology and Oncology Follow Up Visit  Kimberly Monroe [id number] [birth year] 57 y.o. 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 She states that neurology felt this was not able to be stented.  Her original symptoms that presented were vision loss in left eye and dizziness.  She is on Coumadin but has not been therapeutic. Her INR today is 1.41. We have not seen her since October 2017. She has canceled and not shown for several appointments. She has continued to take her old dose of Coumadin and not come in for INR checks.  No bleeding, bruising or petechiae.  She has had no fever, chills, n/v, cough, rash, dizziness, blurry vision, SOB, chest pain, palpitations, abdominal pain or changes in bowel or bladder habits.  No swelling, tenderness, numbness or tingling in her extremities at this time. No c/o pain.  No lymphadenopathy noted on exam.  She is eating healthy and staying well hydrated. Her weight is stable.   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   ECOG Performance Status: 1 - Symptomatic but completely ambulatory  Medications:  Allergies as of 06/10/2018      Reactions   Lime Flavor [flavoring Agent] Anaphylaxis   Amoxicillin    Other reaction(s): Other (See Comments) Possibility-patient has never taken it   Penicillins Other (See Comments)   As a child Possibility-patient has never taken it      Medication List        Accurate as of 06/10/18   9:01 AM. Always use your most recent med list.          atorvastatin 20 MG tablet Commonly known as:  LIPITOR Take 1 tablet (20 mg total) by mouth daily.   lisinopril 20 MG tablet Commonly known as:  PRINIVIL,ZESTRIL TAKE 1 TABLET BY MOUTH EVERY DAY   LORazepam 0.5 MG tablet Commonly known as:  ATIVAN Take one tab 30 minutes before procedure   meclizine 25 MG tablet Commonly known as:  ANTIVERT Take 1 tablet (25 mg total) by mouth 3 (three) times daily as needed for dizziness.   Vitamin D (Ergocalciferol) 50000 units Caps capsule Commonly known as:  DRISDOL TAKE ONE CAPSULE (50,000 UNITS TOTAL) BY MOUTH ONCE A WEEK.   Vitamin D (Ergocalciferol) 50000 units Caps capsule Commonly known as:  DRISDOL TAKE ONE CAPSULE BY MOUTH WEEKLY AS ONE DOSE   warfarin 7.5 MG tablet Commonly known as:  COUMADIN Take as directed by the anticoagulation clinic. If you are unsure how to take this medication, talk to your nurse or doctor. Original instructions:  Take 1 tablet (7.5 mg total) by mouth daily.       Allergies:  Allergies  Allergen Reactions  . Lime Flavor [Flavoring Agent] Anaphylaxis  . Amoxicillin     Other reaction(s): Other (See Comments) Possibility-patient has never taken it  . Penicillins Other (See Comments)    As a child Possibility-patient has never taken it    Past Medical History, Surgical history, Social history,  and Family History were reviewed and updated.  Review of Systems: All other 10 point review of systems is negative.   Physical Exam:  vitals were not taken for this visit.   Wt Readings from Last 3 Encounters:  12/25/17 230 lb 12.8 oz (104.7 kg)  12/17/17 229 lb 1.6 oz (103.9 kg)  12/16/17 228 lb 3.2 oz (103.5 kg)    Ocular: Sclerae unicteric, pupils equal, round and reactive to light Ear-nose-throat: Oropharynx clear, dentition fair Lymphatic: No cervical, supraclavicular or axillary adenopathy Lungs no rales or rhonchi, good excursion  bilaterally Heart regular rate and rhythm, no murmur appreciated Abd soft, nontender, positive bowel sounds, no liver or spleen tip palpated on exam, no fluid wave  MSK no focal spinal tenderness, no joint edema Neuro: non-focal, well-oriented, appropriate affect Breasts: Deferred   Lab Results  Component Value Date   WBC 8.2 12/15/2017   HGB 15.3 (H) 12/15/2017   HCT 45.7 12/15/2017   MCV 90.5 12/15/2017   PLT 290 12/15/2017   Lab Results  Component Value Date   FERRITIN 76 11/13/2015   IRON 94 11/13/2015   TIBC 290 11/13/2015   UIBC 196 11/13/2015   IRONPCTSAT 32 11/13/2015   Lab Results  Component Value Date   RETICCTPCT 1.5 09/06/2012   RBC 5.05 12/15/2017   RETICCTABS 67.7 09/06/2012   No results found for: KPAFRELGTCHN, LAMBDASER, KAPLAMBRATIO No results found for: IGGSERUM, IGA, IGMSERUM No results found for: Odetta Pink, SPEI   Chemistry      Component Value Date/Time   NA 139 12/15/2017 1355   NA 141 07/23/2016 0956   K 4.0 12/15/2017 1355   K 4.2 07/23/2016 0956   CL 105 12/15/2017 1355   CO2 26 12/15/2017 1355   CO2 25 07/23/2016 0956   BUN 12 12/15/2017 1355   BUN 14.6 07/23/2016 0956   CREATININE 0.72 12/15/2017 1355   CREATININE 0.7 07/23/2016 0956      Component Value Date/Time   CALCIUM 9.1 12/15/2017 1355   CALCIUM 9.3 07/23/2016 0956   ALKPHOS 89 02/22/2018 0739   ALKPHOS 97 07/23/2016 0956   AST 24 02/22/2018 0739   AST 25 07/23/2016 0956   ALT 38 (H) 02/22/2018 0739   ALT 27 07/23/2016 0956   BILITOT 0.5 02/22/2018 0739   BILITOT 0.30 07/23/2016 0956      Impression and Plan: Kimberly Monroe is a very pleasant 57 yo caucasian female with history of idiopathic PE which has resolved. She has not come in to see [place] in almost 2 years so she has not had her INR monitored regularly. She had a CVA in April as mentioned above while on her original dose of Coumadin. Her INR today is not therapeutic  at 1.41.  I spoke with Dr. Marin Olp and we will now change her to Xarelto and discontinue her Coumadin.  The patient verbalized understanding and agreement with this plan.  We will plan to see her again in another month for follow-up.  She can contact our office with any questions or concerns. We can certainly see her sooner if need be.   Laverna Peace, NP 9/5/20199:01 AM

## 2018-06-11 LAB — VITAMIN D 25 HYDROXY (VIT D DEFICIENCY, FRACTURES): VIT D 25 HYDROXY: 45 ng/mL (ref 30.0–100.0)

## 2018-07-08 ENCOUNTER — Inpatient Hospital Stay: Payer: 59 | Attending: Family | Admitting: Family

## 2018-07-08 ENCOUNTER — Encounter: Payer: Self-pay | Admitting: Family

## 2018-07-08 ENCOUNTER — Inpatient Hospital Stay: Payer: 59

## 2018-07-08 ENCOUNTER — Other Ambulatory Visit: Payer: Self-pay

## 2018-07-08 VITALS — BP 139/74 | HR 56 | Temp 98.1°F | Resp 18 | Wt 203.0 lb

## 2018-07-08 DIAGNOSIS — Z880 Allergy status to penicillin: Secondary | ICD-10-CM | POA: Diagnosis not present

## 2018-07-08 DIAGNOSIS — Z79899 Other long term (current) drug therapy: Secondary | ICD-10-CM | POA: Insufficient documentation

## 2018-07-08 DIAGNOSIS — Z7901 Long term (current) use of anticoagulants: Secondary | ICD-10-CM | POA: Insufficient documentation

## 2018-07-08 DIAGNOSIS — Z86711 Personal history of pulmonary embolism: Secondary | ICD-10-CM | POA: Diagnosis present

## 2018-07-08 DIAGNOSIS — D509 Iron deficiency anemia, unspecified: Secondary | ICD-10-CM | POA: Insufficient documentation

## 2018-07-08 DIAGNOSIS — Z8673 Personal history of transient ischemic attack (TIA), and cerebral infarction without residual deficits: Secondary | ICD-10-CM | POA: Diagnosis not present

## 2018-07-08 DIAGNOSIS — I639 Cerebral infarction, unspecified: Secondary | ICD-10-CM

## 2018-07-08 LAB — CBC WITH DIFFERENTIAL (CANCER CENTER ONLY)
BASOS ABS: 0.1 10*3/uL (ref 0.0–0.1)
BASOS PCT: 1 %
EOS ABS: 0.2 10*3/uL (ref 0.0–0.5)
Eosinophils Relative: 2 %
HEMATOCRIT: 44.7 % (ref 34.8–46.6)
Hemoglobin: 14.7 g/dL (ref 11.6–15.9)
Lymphocytes Relative: 29 %
Lymphs Abs: 2.2 10*3/uL (ref 0.9–3.3)
MCH: 30 pg (ref 26.0–34.0)
MCHC: 32.9 g/dL (ref 32.0–36.0)
MCV: 91.2 fL (ref 81.0–101.0)
MONO ABS: 0.6 10*3/uL (ref 0.1–0.9)
Monocytes Relative: 8 %
Neutro Abs: 4.5 10*3/uL (ref 1.5–6.5)
Neutrophils Relative %: 60 %
PLATELETS: 240 10*3/uL (ref 145–400)
RBC: 4.9 MIL/uL (ref 3.70–5.32)
RDW: 14.1 % (ref 11.1–15.7)
WBC Count: 7.5 10*3/uL (ref 3.9–10.0)

## 2018-07-08 LAB — CMP (CANCER CENTER ONLY)
ALBUMIN: 3.5 g/dL (ref 3.5–5.0)
ALT: 32 U/L (ref 10–47)
AST: 29 U/L (ref 11–38)
Alkaline Phosphatase: 82 U/L (ref 26–84)
Anion gap: 0 — ABNORMAL LOW (ref 5–15)
BILIRUBIN TOTAL: 0.5 mg/dL (ref 0.2–1.6)
BUN: 11 mg/dL (ref 7–22)
CALCIUM: 9.5 mg/dL (ref 8.0–10.3)
CHLORIDE: 110 mmol/L — AB (ref 98–108)
CO2: 30 mmol/L (ref 18–33)
Creatinine: 0.8 mg/dL (ref 0.60–1.20)
GLUCOSE: 111 mg/dL (ref 73–118)
Potassium: 4.2 mmol/L (ref 3.3–4.7)
SODIUM: 140 mmol/L (ref 128–145)
Total Protein: 6.6 g/dL (ref 6.4–8.1)

## 2018-07-08 NOTE — Progress Notes (Signed)
Hematology and Oncology Follow Up Visit  Kimberly Monroe [id number] 06/01/1961 57 y.o. 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 She is doing quite well and has no complaints at this time.  She has transitioned to Xarelto nicely and has had no issues with bleeding, bruising or petechiae.  She has some occasional fatigue and history of headaches.  No fever, chills, n/v, cough, rash, dizziness, blurred or loss of vision, SOB, chest pain, palpitations, abdominal pain or changed in bowel or bladder habits.  No swelling, tenderness, weakness, numbness or tingling in her extremities.  No lymphadenopathy noted on her exam.  She has maintained a good appetite and is staying well hydrated. Her weight is stable.  She is staying busy with work and still exercising regularly.   ECOG Performance Status: 1 - Symptomatic but completely ambulatory  Medications:  Allergies as of 07/08/2018      Reactions   Lime Flavor [flavoring Agent] Anaphylaxis   Amoxicillin    Other reaction(s): Other (See Comments) Possibility-patient has never taken it   Penicillins Other (See Comments)   As a child Possibility-patient has never taken it As a child Possibility-patient has never taken it As a child Possibility-patient has never taken it As a child Possibility-patient has never taken it Other reaction(s): Other (See Comments) Possibility-patient has never taken it      Medication List        Accurate as of 07/08/18  8:51 AM. Always use your most recent med list.          atorvastatin 20 MG tablet Commonly known as:  LIPITOR Take 1 tablet (20 mg total) by mouth daily.   lisinopril 20 MG tablet Commonly known as:  PRINIVIL,ZESTRIL TAKE 1 TABLET BY MOUTH EVERY DAY   meclizine 25 MG tablet Commonly known as:   ANTIVERT Take 1 tablet (25 mg total) by mouth 3 (three) times daily as needed for dizziness.   rivaroxaban 20 MG Tabs tablet Commonly known as:  XARELTO Take 1 tablet (20 mg total) by mouth daily with supper.   Vitamin D (Ergocalciferol) 50000 units Caps capsule Commonly known as:  DRISDOL TAKE ONE CAPSULE BY MOUTH WEEKLY AS ONE DOSE       Allergies:  Allergies  Allergen Reactions  . Lime Flavor [Flavoring Agent] Anaphylaxis  . Amoxicillin     Other reaction(s): Other (See Comments) Possibility-patient has never taken it  . Penicillins Other (See Comments)    As a child Possibility-patient has never taken it As a child Possibility-patient has never taken it As a child Possibility-patient has never taken it As a child Possibility-patient has never taken it Other reaction(s): Other (See Comments) Possibility-patient has never taken it     Past Medical History, Surgical history, Social history, and Family History were reviewed and updated.  Review of Systems: All other 10 point review of systems is negative.   Physical Exam:  vitals were not taken for this visit.   Wt Readings from Last 3 Encounters:  06/10/18 210 lb (95.3 kg)  12/25/17 230 lb 12.8 oz (104.7 kg)  12/17/17 229 lb 1.6 oz (103.9 kg)    Ocular: Sclerae unicteric, pupils equal, round and reactive to light Ear-nose-throat: Oropharynx clear, dentition fair Lymphatic: No cervical, supraclavicular or axillary adenopathy Lungs no rales or rhonchi, good excursion  bilaterally Heart regular rate and rhythm, no murmur appreciated Abd soft, nontender, positive bowel sounds, no liver or spleen tip palpated on exam, no fluid wave  MSK no focal spinal tenderness, no joint edema Neuro: non-focal, well-oriented, appropriate affect Breasts: Deferred   Lab Results  Component Value Date   WBC 7.5 07/08/2018   HGB 14.7 07/08/2018   HCT 44.7 07/08/2018   MCV 91.2 07/08/2018   PLT 240 07/08/2018   Lab Results    Component Value Date   FERRITIN 94 06/10/2018   IRON 101 06/10/2018   TIBC 299 06/10/2018   UIBC 198 06/10/2018   IRONPCTSAT 34 06/10/2018   Lab Results  Component Value Date   RETICCTPCT 1.5 09/06/2012   RBC 4.90 07/08/2018   RETICCTABS 67.7 09/06/2012   No results found for: KPAFRELGTCHN, LAMBDASER, KAPLAMBRATIO No results found for: Kandis Cocking, IGMSERUM No results found for: Odetta Pink, SPEI   Chemistry      Component Value Date/Time   NA 142 06/10/2018 0827   NA 141 07/23/2016 0956   K 4.3 06/10/2018 0827   K 4.2 07/23/2016 0956   CL 105 06/10/2018 0827   CO2 26 06/10/2018 0827   CO2 25 07/23/2016 0956   BUN 18 06/10/2018 0827   BUN 14.6 07/23/2016 0956   CREATININE 0.81 06/10/2018 0827   CREATININE 0.7 07/23/2016 0956      Component Value Date/Time   CALCIUM 10.1 06/10/2018 0827   CALCIUM 9.3 07/23/2016 0956   ALKPHOS 99 06/10/2018 0827   ALKPHOS 97 07/23/2016 0956   AST 33 06/10/2018 0827   AST 25 07/23/2016 0956   ALT 38 06/10/2018 0827   ALT 27 07/23/2016 0956   BILITOT 0.6 06/10/2018 0827   BILITOT 0.30 07/23/2016 0956      Impression and Plan: Kimberly Monroe is a very pleasant 57 yo caucasian female with history of idiopathic PE as well as CVA in April of this year. She is doing well on Xarelto and has no complaints at this time.  She will continue her same regimen and we will plan to see her back in another 3 months for follow-up.  She will contact our office with any questions or concerns. We can certainly see her sooner if needed.   Laverna Peace, NP 10/3/20198:51 AM

## 2018-08-24 ENCOUNTER — Ambulatory Visit: Payer: Self-pay

## 2018-08-24 ENCOUNTER — Encounter: Payer: Self-pay | Admitting: Family Medicine

## 2018-08-24 ENCOUNTER — Other Ambulatory Visit: Payer: Self-pay

## 2018-08-24 ENCOUNTER — Ambulatory Visit: Payer: 59 | Admitting: Family Medicine

## 2018-08-24 VITALS — BP 124/76 | HR 59 | Temp 98.2°F | Wt 205.0 lb

## 2018-08-24 DIAGNOSIS — I10 Essential (primary) hypertension: Secondary | ICD-10-CM | POA: Diagnosis not present

## 2018-08-24 NOTE — Telephone Encounter (Signed)
FYI, on schedule for this morning.

## 2018-08-24 NOTE — Patient Instructions (Signed)
Bring in your BP cuff to compare with ours    Our goal BP is < 130/80.

## 2018-08-24 NOTE — Telephone Encounter (Signed)
FYI

## 2018-08-24 NOTE — Telephone Encounter (Signed)
Pt. Called to schedule an appt. For c/o elevated BP.  Reported BP 153/90 in PM 11/18, after resting on couch for period of time.  Reported another reading recently in "134/ 80's range."  C/o headache.  Stated "I don't know if the headache is causing my BP to go up, or the increased BP is causing the HA."  Also stated "I could be dehydrated."  Reported concentrated urine. Denied any UTI symptoms.  Stated she usually drinks coffee.  Pt. questioned if she is supposed to be taking Lisinopril 10 mg. or 20 mg.?  Reported she has both strengths on hand, and is confused as to which she is supposed to take.  Advised that in office note of 12/17/17, the Lisinopril was increased to 20 mg. qd.  The pt. stated she had it refilled, and it was a 10 mg. tablet, and she continued on that dose.  Pt. verb. concern about elevated blood pressure, due to hx of previous stroke that was misdiagnosed.  Stated HA has been consistent over past 2 days, and sometimes feels worse than other times.  Denied any vision change/ loss, speech difficulty, or weakness in extremities.  Stated "yesterday my right leg didn't feel right; there was like a cramping in my calf."  Denied swelling in lower extremities.  Pt. Requesting an appt. In 1st available time.  Appt. Scheduled with PCP at 10:40 AM      Reason for Disposition . Systolic BP  >= 295 OR Diastolic >= 284  Answer Assessment - Initial Assessment Questions 1. BLOOD PRESSURE: "What is the blood pressure?" "Did you take at least two measurements 5 minutes apart?"     153/90 evening 11/18; 134/80's range  2. ONSET: "When did you take your blood pressure?"     Irregularly checking the BP  3. HOW: "How did you obtain the blood pressure?" (e.g., visiting nurse, automatic home BP monitor)     Digital  4. HISTORY: "Do you have a history of high blood pressure?"     Yes 5. MEDICATIONS: "Are you taking any medications for blood pressure?" "Have you missed any doses recently?"     Lisinopril  10 mg. (office visit of 12/17/17 showed increase to 20 mg) 6. OTHER SYMPTOMS: "Do you have any symptoms?" (e.g., headache, chest pain, blurred vision, difficulty breathing, weakness)    Headache consistent over 2 days; denied temp loss of vision, denied speech difficulty, or weakness of extremities. Stated her right leg didn't feel right yesterday; c/o cramping in right calf.  Denied swelling of feet or legs.  Protocols used: HIGH BLOOD PRESSURE-A-AH

## 2018-08-24 NOTE — Progress Notes (Signed)
Subjective:     Patient ID: Kimberly Monroe, female   DOB: 1961-03-19, 57 y.o.   MRN: [id number]  HPI Patient seen with concern for elevated blood pressure.  She has history of hypertension currently treated with lisinopril 10 mg daily.  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  She is followed at [facility].  She has hyperlipidemia treated with atorvastatin.  She has history of bilateral pulmonary emboli and has been on chronic anticoagulation with Xarelto 20 mg daily.  Patient recently had some recurrent headaches which were left-sided and throbbing and been typical of migraine she has had the past.  She took some Tylenol with some improvement.  She tries to avoid other medications.  Obviously, avoids triptans with her history of stroke.  She started monitoring blood pressure and had several systolic readings around 419 recently.  No recent speech changes.  No focal weakness.  No recurrent vertigo.  Denies alcohol use.  No recent excessive sodium use.  She has been losing some weight due to her efforts.  Past Medical History:  Diagnosis Date  . Allergy    seasonal  . Anemia   . Anxiety   . Basal cell cancer   . Depression   . History of blood clotting disorder   . Melanoma ([place]) 2009   insitu  . Menorrhagia 02/10/2013  . Migraine    history of/none in years  . Ovarian cancer ([place]) 1995   left ovary  . Pulmonary embolism ([place])   . Squamous cell carcinoma    Past Surgical History:  Procedure Laterality Date  . ABDOMINAL HYSTERECTOMY  03/06/2013  . BREAST DUCTAL SYSTEM EXCISION  2009   L intraductal papilloma  . [place]   twins, singleton  . laparoscopy with ovarian cystectomy  1994   ovarian torsion  . 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 MOHS SURGERY  2009   Dr Link Snuffer    reports that she has never smoked. She has never used smokeless tobacco. She reports that she drinks alcohol. She reports that she does not use  drugs. family history includes Breast cancer (age of onset: 55) in her mother; Cancer in her paternal uncle; Cancer (age of onset: 51) in her father; Colon cancer in her father. Allergies  Allergen Reactions  . Lime Flavor [Flavoring Agent] Anaphylaxis  . Amoxicillin     Other reaction(s): Other (See Comments) Possibility-patient has never taken it  . Penicillins Other (See Comments)    As a child Possibility-patient has never taken it As a child Possibility-patient has never taken it As a child Possibility-patient has never taken it As a child Possibility-patient has never taken it Other reaction(s): Other (See Comments) Possibility-patient has never taken it      Review of Systems  Constitutional: Negative for appetite change and unexpected weight change.  Respiratory: Negative for shortness of breath.   Cardiovascular: Negative for chest pain.  Neurological: Positive for headaches. Negative for dizziness, seizures, syncope and speech difficulty.  Psychiatric/Behavioral: Negative for confusion.       Objective:   Physical Exam  Constitutional: She is oriented to person, place, and time. She appears well-developed and well-nourished.  Eyes: Pupils are equal, round, and reactive to light.  Cardiovascular: Normal rate and regular rhythm.  Pulmonary/Chest: Effort normal and breath sounds normal.  Neurological: She is alert and oriented to person, place, and time. No cranial nerve deficit.  Assessment:     Patient presents with several recent elevated blood pressure readings at home in the setting of acute headache.  Possibly exacerbated by headache.  Blood pressure much improved today.  Initial reading 124/76 and repeat right arm seated after rest 128/78.  Goal blood pressure less than 130/80 with prior history of CVA    Plan:     -Continue close monitoring.  Patient will bring her cuff in this afternoon to compare with ours -If she has consistent blood pressures  greater than 098 systolic consider titration of lisinopril to 20 mg daily -Continue low-sodium diet  Eulas Post MD [facility]

## 2018-09-10 ENCOUNTER — Other Ambulatory Visit: Payer: Self-pay | Admitting: Family Medicine

## 2018-10-06 HISTORY — PX: CEREBRAL ANGIOGRAM: SHX1326

## 2018-10-08 ENCOUNTER — Inpatient Hospital Stay: Payer: BLUE CROSS/BLUE SHIELD | Attending: Family | Admitting: Family

## 2018-10-08 ENCOUNTER — Inpatient Hospital Stay: Payer: BLUE CROSS/BLUE SHIELD

## 2018-10-08 ENCOUNTER — Encounter: Payer: Self-pay | Admitting: Family

## 2018-10-08 VITALS — BP 128/73 | HR 56 | Temp 98.5°F | Resp 18 | Ht 64.0 in | Wt 208.0 lb

## 2018-10-08 DIAGNOSIS — Z8673 Personal history of transient ischemic attack (TIA), and cerebral infarction without residual deficits: Secondary | ICD-10-CM | POA: Diagnosis not present

## 2018-10-08 DIAGNOSIS — I639 Cerebral infarction, unspecified: Secondary | ICD-10-CM

## 2018-10-08 DIAGNOSIS — D509 Iron deficiency anemia, unspecified: Secondary | ICD-10-CM | POA: Insufficient documentation

## 2018-10-08 DIAGNOSIS — Z7901 Long term (current) use of anticoagulants: Secondary | ICD-10-CM | POA: Insufficient documentation

## 2018-10-08 DIAGNOSIS — R002 Palpitations: Secondary | ICD-10-CM | POA: Insufficient documentation

## 2018-10-08 DIAGNOSIS — Z86711 Personal history of pulmonary embolism: Secondary | ICD-10-CM | POA: Diagnosis not present

## 2018-10-08 DIAGNOSIS — Z880 Allergy status to penicillin: Secondary | ICD-10-CM | POA: Diagnosis not present

## 2018-10-08 DIAGNOSIS — R05 Cough: Secondary | ICD-10-CM | POA: Diagnosis not present

## 2018-10-08 DIAGNOSIS — Z79899 Other long term (current) drug therapy: Secondary | ICD-10-CM | POA: Diagnosis not present

## 2018-10-08 DIAGNOSIS — D508 Other iron deficiency anemias: Secondary | ICD-10-CM

## 2018-10-08 LAB — CBC WITH DIFFERENTIAL (CANCER CENTER ONLY)
Abs Immature Granulocytes: 0.02 10*3/uL (ref 0.00–0.07)
Basophils Absolute: 0 10*3/uL (ref 0.0–0.1)
Basophils Relative: 1 %
Eosinophils Absolute: 0.2 10*3/uL (ref 0.0–0.5)
Eosinophils Relative: 2 %
HEMATOCRIT: 44.2 % (ref 36.0–46.0)
HEMOGLOBIN: 14.2 g/dL (ref 12.0–15.0)
IMMATURE GRANULOCYTES: 0 %
LYMPHS ABS: 2.6 10*3/uL (ref 0.7–4.0)
Lymphocytes Relative: 33 %
MCH: 30 pg (ref 26.0–34.0)
MCHC: 32.1 g/dL (ref 30.0–36.0)
MCV: 93.4 fL (ref 80.0–100.0)
Monocytes Absolute: 0.6 10*3/uL (ref 0.1–1.0)
Monocytes Relative: 7 %
NEUTROS PCT: 57 %
Neutro Abs: 4.4 10*3/uL (ref 1.7–7.7)
Platelet Count: 284 10*3/uL (ref 150–400)
RBC: 4.73 MIL/uL (ref 3.87–5.11)
RDW: 13.9 % (ref 11.5–15.5)
WBC: 7.8 10*3/uL (ref 4.0–10.5)
nRBC: 0 % (ref 0.0–0.2)

## 2018-10-08 LAB — CMP (CANCER CENTER ONLY)
ALT: 39 U/L (ref 0–44)
AST: 20 U/L (ref 15–41)
Albumin: 4 g/dL (ref 3.5–5.0)
Alkaline Phosphatase: 94 U/L (ref 38–126)
Anion gap: 6 (ref 5–15)
BUN: 23 mg/dL — ABNORMAL HIGH (ref 6–20)
CHLORIDE: 106 mmol/L (ref 98–111)
CO2: 30 mmol/L (ref 22–32)
Calcium: 9.2 mg/dL (ref 8.9–10.3)
Creatinine: 0.88 mg/dL (ref 0.44–1.00)
GFR, Est AFR Am: >60 mL/min (ref >60)
GFR, Estimated: >60 mL/min (ref >60)
Glucose, Bld: 113 mg/dL — ABNORMAL HIGH (ref 70–99)
Potassium: 4.6 mmol/L (ref 3.5–5.1)
Sodium: 142 mmol/L (ref 135–145)
Total Bilirubin: 0.4 mg/dL (ref 0.3–1.2)
Total Protein: 6.7 g/dL (ref 6.5–8.1)

## 2018-10-08 NOTE — Progress Notes (Signed)
Hematology and Oncology Follow Up Visit  Kimberly Monroe [id number] May 30, 1961 58 y.o. 10/08/2018   Principle Diagnosis:  Idiopathic pulmonary embolism CVA April 2019 Iron deficiency anemia  Current Therapy:   Xarelto 20 mg PO daily IV iron as indicated   Interim History:  Kimberly Monroe is here today for follow-up. She is doing well but states that she will have palpitations for a few seconds once every couple of months. When she experiences this she also feels the need to cough. If this continues or becomes more frequent she will contact her PCP for further work-up.  She continues to do well on Xarelto. No episodes of bleeding, no bruising or petechiae.  She was concerned about her not hydrating well and that causing stress on her kidneys. She has no history of kidney issues.  I spoke with Kimberly Monroe in pharmacy and there were no contraindications for her to be on Xarleto.  She has maintained a good appetite but admits that she need to better hydrate at home. Her weight is stable.  No fever, chills, n/v, cough, rash, dizziness, SOB, chest pain, abdominal pain or changes in bowel or bladder habits.  No swelling, tenderness, numbness or tingling in her extremities.  No lymphadenopathy noted on exam.   ECOG Performance Status: 1 - Symptomatic but completely ambulatory  Medications:  Allergies as of 10/08/2018      Reactions   Lime Flavor [flavoring Agent] Anaphylaxis   Amoxicillin    Other reaction(s): Other (See Comments) Possibility-patient has never taken it   Penicillins Other (See Comments)   As a child Possibility-patient has never taken it As a child Possibility-patient has never taken it As a child Possibility-patient has never taken it As a child Possibility-patient has never taken it Other reaction(s): Other (See Comments) Possibility-patient has never taken it      Medication List       Accurate as of October 08, 2018  8:46 AM. Always use your most recent med list.        atorvastatin 20 MG tablet Commonly known as:  LIPITOR Take 1 tablet (20 mg total) by mouth daily.   lisinopril 10 MG tablet Commonly known as:  PRINIVIL,ZESTRIL Take 10 mg by mouth daily.   lisinopril 10 MG tablet Commonly known as:  PRINIVIL,ZESTRIL TAKE 1 TABLET (10 MG TOTAL) BY MOUTH DAILY.   rivaroxaban 20 MG Tabs tablet Commonly known as:  XARELTO Take 1 tablet (20 mg total) by mouth daily with supper.   Vitamin D (Ergocalciferol) 1.25 MG (50000 UT) Caps capsule Commonly known as:  DRISDOL TAKE ONE CAPSULE BY MOUTH WEEKLY AS ONE DOSE       Allergies:  Allergies  Allergen Reactions  . Lime Flavor [Flavoring Agent] Anaphylaxis  . Amoxicillin     Other reaction(s): Other (See Comments) Possibility-patient has never taken it  . Penicillins Other (See Comments)    As a child Possibility-patient has never taken it As a child Possibility-patient has never taken it As a child Possibility-patient has never taken it As a child Possibility-patient has never taken it Other reaction(s): Other (See Comments) Possibility-patient has never taken it     Past Medical History, Surgical history, Social history, and Family History were reviewed and updated.  Review of Systems: All other 10 point review of systems is negative.   Physical Exam:  vitals were not taken for this visit.   Wt Readings from Last 3 Encounters:  08/24/18 205 lb (93 kg)  07/08/18 203 lb (92.1  kg)  06/10/18 210 lb (95.3 kg)    Ocular: Sclerae unicteric, pupils equal, round and reactive to light Ear-nose-throat: Oropharynx clear, dentition fair Lymphatic: No cervical, supraclavicular or axillary adenopathy Lungs no rales or rhonchi, good excursion bilaterally Heart regular rate and rhythm, no murmur appreciated Abd soft, nontender, positive bowel sounds, no liver or spleen tip palpated on exam, no fluid wave  MSK no focal spinal tenderness, no joint edema Neuro: non-focal, well-oriented,  appropriate affect Breasts: Deferred   Lab Results  Component Value Date   WBC 7.8 10/08/2018   HGB 14.2 10/08/2018   HCT 44.2 10/08/2018   MCV 93.4 10/08/2018   PLT 284 10/08/2018   Lab Results  Component Value Date   FERRITIN 94 06/10/2018   IRON 101 06/10/2018   TIBC 299 06/10/2018   UIBC 198 06/10/2018   IRONPCTSAT 34 06/10/2018   Lab Results  Component Value Date   RETICCTPCT 1.5 09/06/2012   RBC 4.73 10/08/2018   RETICCTABS 67.7 09/06/2012   No results found for: KPAFRELGTCHN, LAMBDASER, KAPLAMBRATIO No results found for: IGGSERUM, IGA, IGMSERUM No results found for: Odetta Pink, SPEI   Chemistry      Component Value Date/Time   NA 140 07/08/2018 0832   NA 141 07/23/2016 0956   K 4.2 07/08/2018 0832   K 4.2 07/23/2016 0956   CL 110 (H) 07/08/2018 0832   CO2 30 07/08/2018 0832   CO2 25 07/23/2016 0956   BUN 11 07/08/2018 0832   BUN 14.6 07/23/2016 0956   CREATININE 0.80 07/08/2018 0832   CREATININE 0.7 07/23/2016 0956      Component Value Date/Time   CALCIUM 9.5 07/08/2018 0832   CALCIUM 9.3 07/23/2016 0956   ALKPHOS 82 07/08/2018 0832   ALKPHOS 97 07/23/2016 0956   AST 29 07/08/2018 0832   AST 25 07/23/2016 0956   ALT 32 07/08/2018 0832   ALT 27 07/23/2016 0956   BILITOT 0.5 07/08/2018 0832   BILITOT 0.30 07/23/2016 0956       Impression and Plan: Kimberly Monroe is a very pleasant 58 yo caucasian female with history of idiopathic PE which has since resolved. She then had a CVA in April 2019 while on coumadin but had not been coming in for INR checks.  She is now on Xarelto and is doing well. She will continue her same regimen.  We will plan to see her back in another 4 months.  She is in agreement with the plan and will contact our office with any questions or concerns. We can certainly see her sooner if need be.   Kimberly Peace, NP 1/3/20208:46 AM

## 2018-10-13 ENCOUNTER — Ambulatory Visit: Payer: Self-pay

## 2018-10-13 ENCOUNTER — Encounter: Payer: Self-pay | Admitting: Family Medicine

## 2018-10-13 ENCOUNTER — Ambulatory Visit: Payer: BLUE CROSS/BLUE SHIELD | Admitting: Family Medicine

## 2018-10-13 VITALS — BP 125/83 | HR 80 | Temp 98.8°F | Resp 16 | Ht 64.0 in | Wt 210.5 lb

## 2018-10-13 DIAGNOSIS — J069 Acute upper respiratory infection, unspecified: Secondary | ICD-10-CM

## 2018-10-13 DIAGNOSIS — R05 Cough: Secondary | ICD-10-CM | POA: Diagnosis not present

## 2018-10-13 DIAGNOSIS — R059 Cough, unspecified: Secondary | ICD-10-CM

## 2018-10-13 LAB — POCT INFLUENZA A/B
Influenza A, POC: NEGATIVE
Influenza B, POC: NEGATIVE

## 2018-10-13 MED ORDER — HYDROCODONE-HOMATROPINE 5-1.5 MG/5ML PO SYRP: 5.0000 mL | ORAL_SOLUTION | Freq: Two times a day (BID) | ORAL | 0 refills | Status: AC | PRN

## 2018-10-13 MED ORDER — BENZONATATE 100 MG PO CAPS: 200.0000 mg | ORAL_CAPSULE | Freq: Two times a day (BID) | ORAL | 0 refills | Status: DC | PRN

## 2018-10-13 NOTE — Telephone Encounter (Signed)
Pt c/o dry nonproductive cough, chest congestion, runny nose. Pt stated that her coughing is so bad that she gags and pees herself. Pt called out of work today. Pt denies fever, SOB, wheezing. Pt is drinking fluids and taking hot showers to help with the cough.  Pt has h/o multiple pulmonary embolisms.  Care advice given and pt verbalized understanding. No PCP appointments. Pt scheduled to see  Dr Martinique at noon.  Reason for Disposition . SEVERE coughing spells (e.g., whooping sound after coughing, vomiting after coughing)  Answer Assessment - Initial Assessment Questions 1. ONSET: "When did the cough begin?"      Few days ago 2. SEVERITY: "How bad is the cough today?"      Frequent cough with gagging 3. RESPIRATORY DISTRESS: "Describe your breathing."      No SOB or wheezing 4. FEVER: "Do you have a fever?" If so, ask: "What is your temperature, how was it measured, and when did it start?"     no 5. HEMOPTYSIS: "Are you coughing up any blood?" If so ask: "How much?" (flecks, streaks, tablespoons, etc.)     Cough is nonproductive 6. TREATMENT: "What have you done so far to treat the cough?" (e.g., meds, fluids, humidifier) fluids and hot showers 7. CARDIAC HISTORY: "Do you have any history of heart disease?" (e.g., heart attack, congestive heart failure)      no 8. LUNG HISTORY: "Do you have any history of lung disease?"  (e.g., pulmonary embolus, asthma, emphysema)     Hx multiple PE-  9. PE RISK FACTORS: "Do you have a history of blood clots?" (or: recent major surgery, recent prolonged travel, bedridden)     yes 10. OTHER SYMPTOMS: "Do you have any other symptoms? (e.g., runny nose, wheezing, chest pain)       Runny nose, chest congestion 11. PREGNANCY: "Is there any chance you are pregnant?" "When was your last menstrual period?"       n/a 12. TRAVEL: "Have you traveled out of the country in the last month?" (e.g., travel history, exposures)       no  Protocols used: COUGH -  ACUTE NON-PRODUCTIVE-A-AH

## 2018-10-13 NOTE — Progress Notes (Signed)
ACUTE VISIT  HPI:  Chief Complaint  Patient presents with  . Chest congestion  . Cough    dry cough  . Nasal Congestion    Kimberly Monroe is a 58 y.o.female with history of hypertension CVA, bilateral PE, and depression is here today complaining of 2 days of respiratory symptoms. Productive cough, she is unable to bring up sputum, it feels thick.  She has not identified exacerbating or alleviating factors. Cough is keeping her from sleep.  Denies hemoptysis. She has not noted dyspnea, wheezing, or chest pain. + Body aches. Clear rhinorrhea and sore throat.   URI   This is a new problem. The current episode started in the past 7 days. The problem has been unchanged. There has been no fever. Associated symptoms include congestion, coughing, rhinorrhea and a sore throat. Pertinent negatives include no abdominal pain, diarrhea, ear pain, headaches, nausea, neck pain, rash, swollen glands, vomiting or wheezing. She has tried nothing for the symptoms.    No Hx of recent travel. Her boss has been sick with respiratory symptoms. No known insect bite.  Hx of allergies: Seasonal allergies.  OTC medications for this problem: Nothing, she is afraid of side effects.  Review of Systems  Constitutional: Positive for activity change, appetite change and fatigue. Negative for fever.  HENT: Positive for congestion, postnasal drip, rhinorrhea, sinus pressure and sore throat. Negative for ear pain, mouth sores, trouble swallowing and voice change.   Eyes: Negative for discharge, redness and itching.  Respiratory: Positive for cough. Negative for shortness of breath and wheezing.   Gastrointestinal: Negative for abdominal pain, diarrhea, nausea and vomiting.  Musculoskeletal: Positive for myalgias. Negative for gait problem and neck pain.  Skin: Negative for rash.  Allergic/Immunologic: Positive for environmental allergies.  Neurological: Negative for syncope, weakness and  headaches.  Hematological: Negative for adenopathy. Does not bruise/bleed easily.  Psychiatric/Behavioral: Positive for sleep disturbance. Negative for confusion. The patient is nervous/anxious.       Current Outpatient Medications on File Prior to Visit  Medication Sig Dispense Refill  . atorvastatin (LIPITOR) 20 MG tablet Take 1 tablet (20 mg total) by mouth daily. 90 tablet 3  . lisinopril (PRINIVIL,ZESTRIL) 10 MG tablet TAKE 1 TABLET (10 MG TOTAL) BY MOUTH DAILY. 90 tablet 1  . rivaroxaban (XARELTO) 20 MG TABS tablet Take 1 tablet (20 mg total) by mouth daily with supper. 30 tablet 6  . Vitamin D, Ergocalciferol, (DRISDOL) 50000 units CAPS capsule TAKE ONE CAPSULE BY MOUTH WEEKLY AS ONE DOSE 12 capsule 2   No current facility-administered medications on file prior to visit.      Past Medical History:  Diagnosis Date  . Allergy    seasonal  . Anemia   . Anxiety   . Basal cell cancer   . Depression   . History of blood clotting disorder   . Melanoma ([place]) 2009   insitu  . Menorrhagia 02/10/2013  . Migraine    history of/none in years  . Ovarian cancer ([place]) 1995   left ovary  . Pulmonary embolism ([place])   . Squamous cell carcinoma    Allergies  Allergen Reactions  . Lime Flavor [Flavoring Agent] Anaphylaxis  . Amoxicillin Other (See Comments)    Patient stated,"my mom told me to never take it. I don't even know what type of reaction."  . Penicillins Other (See Comments)    Patient stated,"I was told not to take it."     Social  History   Socioeconomic History  . Marital status: Married    Spouse name: Not on file  . Number of children: Not on file  . Years of education: Not on file  . Highest education level: Not on file  Occupational History  . Not on file  Social Needs  . Financial resource strain: Not on file  . Food insecurity:    Worry: Not on file    Inability: Not on file  . Transportation needs:    Medical: Not on file    Non-medical: Not on file    Tobacco Use  . Smoking status: Never Smoker  . Smokeless tobacco: Never Used  . Tobacco comment: never used cigarettes  Substance and Sexual Activity  . Alcohol use: Yes    Alcohol/week: 0.0 standard drinks    Comment: wine-occassionally  . Drug use: No  . Sexual activity: Not on file  Lifestyle  . Physical activity:    Days per week: Not on file    Minutes per session: Not on file  . Stress: Not on file  Relationships  . Social connections:    Talks on phone: Not on file    Gets together: Not on file    Attends religious service: Not on file    Active member of club or organization: Not on file    Attends meetings of clubs or organizations: Not on file    Relationship status: Not on file  Other Topics Concern  . Not on file  Social History Narrative  . Not on file    Vitals:   10/13/18 1155  BP: 125/83  Pulse: 80  Resp: 16  Temp: 98.8 F (37.1 C)  SpO2: 97%   Body mass index is 36.13 kg/m.   Physical Exam  Nursing note and vitals reviewed. Constitutional: She is oriented to person, place, and time. She appears well-developed. She does not appear ill. No distress.  HENT:  Head: Normocephalic and atraumatic.  Right Ear: Tympanic membrane, external ear and ear canal normal.  Left Ear: Tympanic membrane, external ear and ear canal normal.  Nose: Rhinorrhea present. Right sinus exhibits no maxillary sinus tenderness and no frontal sinus tenderness. Left sinus exhibits no maxillary sinus tenderness and no frontal sinus tenderness.  Mouth/Throat: Uvula is midline and mucous membranes are normal. Posterior oropharyngeal erythema (Mild) present. No oropharyngeal exudate or posterior oropharyngeal edema.  Postnasal drainage.  Eyes: Conjunctivae are normal.  Neck: No muscular tenderness present.  Cardiovascular: Normal rate and regular rhythm.  No murmur heard. Respiratory: Effort normal and breath sounds normal. No stridor. No respiratory distress.  Lymphadenopathy:        Head (right side): No submandibular adenopathy present.       Head (left side): No submandibular adenopathy present.    She has cervical adenopathy (small, < 1 cm).       Right cervical: Posterior cervical adenopathy present.       Left cervical: Posterior cervical adenopathy present.  Neurological: She is alert and oriented to person, place, and time. She has normal strength. Gait normal.  Skin: Skin is warm. No rash noted. No erythema.  Psychiatric: Her mood appears anxious.  Well groomed, good eye contact.      ASSESSMENT AND PLAN:  Kimberly Monroe was seen today for chest congestion, cough and nasal congestion.  Diagnoses and all orders for this visit:  URI, acute Here in the office rapid flu test was negative. Most likely viral etiology, so symptomatic treatment is recommended  at this time. Recommend monitoring for fever. OTC acetaminophen 500 mg 3 times daily as needed. Adequate hydration. Instructed about warning signs.  -     POC Influenza A/B  Cough Explained that cough and congestion can last a few days and even weeks. Lung auscultation today negative, so I do not think imaging is needed at this time. Some side effects of medication discussed. Follow-up with PCP as needed.  -     POC Influenza A/B -     benzonatate (TESSALON) 100 MG capsule; Take 2 capsules (200 mg total) by mouth 2 (two) times daily as needed for cough. -     HYDROcodone-homatropine (HYCODAN) 5-1.5 MG/5ML syrup; Take 5 mLs by mouth every 12 (twelve) hours as needed for up to 10 days for cough.      Jacobi Ryant G. Martinique, MD  [facility]. [place] office.

## 2018-10-13 NOTE — Patient Instructions (Signed)
A few things to remember from today's visit:   URI, acute - Plan: POC Influenza A/B  Cough - Plan: POC Influenza A/B, benzonatate (TESSALON) 100 MG capsule, HYDROcodone-homatropine (HYCODAN) 5-1.5 MG/5ML syrup   viral infections are self-limited and we treat each symptom depending of severity.  Over the counter medications as decongestants and cold medications usually help, they need to be taken with caution if there is a history of high blood pressure or palpitations. Tylenol  also helps with most symptoms (headache, muscle aching, fever,etc) Plenty of fluids. Honey helps with cough. Steam inhalations helps with runny nose, nasal congestion, and may prevent sinus infections. Cough and nasal congestion could last a few days and sometimes weeks. Please follow in not any better in 1-2 weeks or if symptoms get worse.  Please be sure medication list is accurate. If a new problem present, please set up appointment sooner than planned today.

## 2018-10-30 IMAGING — MR MR MRA HEAD W/O CM
1 series · 23 of 48 positions shown · non-contrast
Comparison: MRI brain head to be rescheduled due to claustrophobia.
Patient was able to complete the MRA exam.

CLINICAL DATA: Dizziness and loss of balance.  Syncope.

EXAM:
MRA HEAD WITHOUT CONTRAST
TECHNIQUE: Angiographic images of the Circle of Willis were obtained using MRA
technique without intravenous contrast.

[Series 3: tof_3d_multi-slab new · axial · 0.7mm · 0.39mm/px · z∈[-41,+69]mm · 23 of 169 slices shown]
[im 1/169]
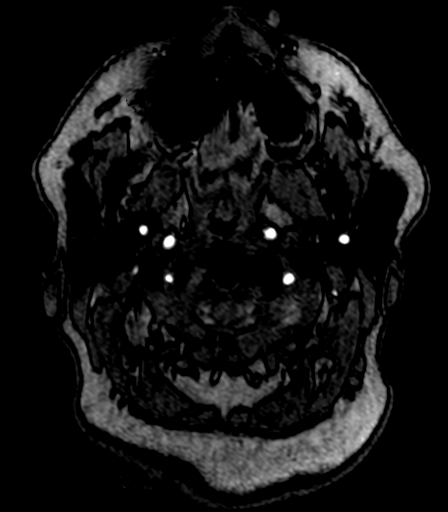
[im 4/169]
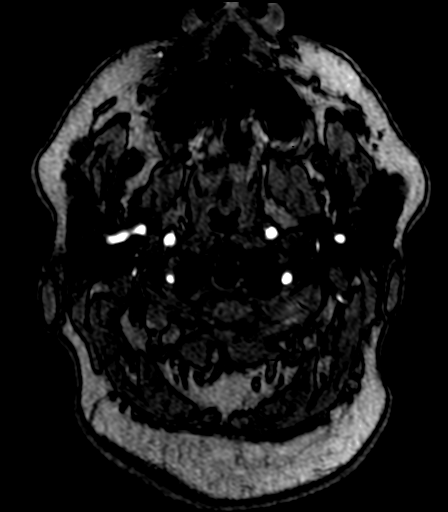
[im 8/169]
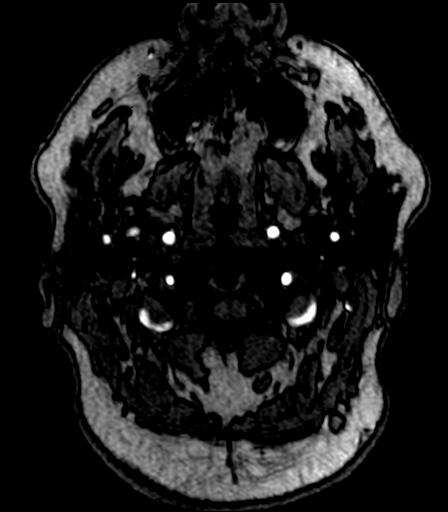
[im 11/169]
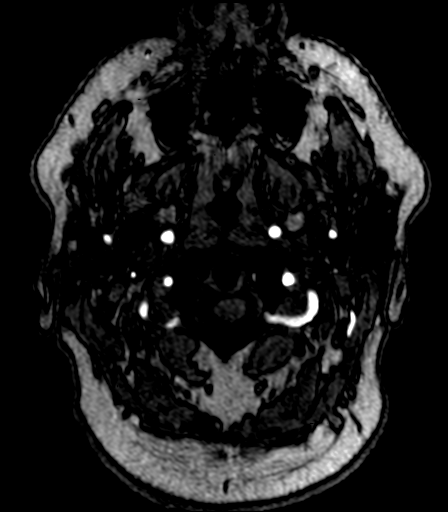
[im 15/169]
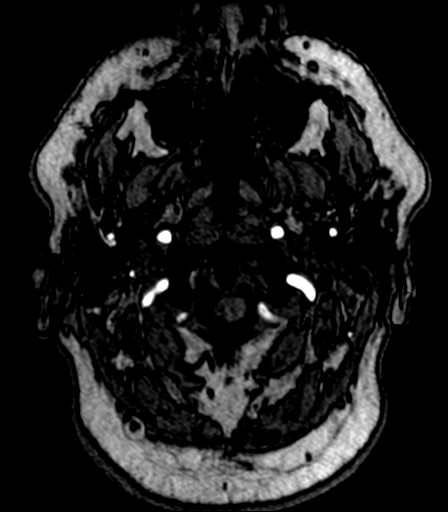
[im 18/169]
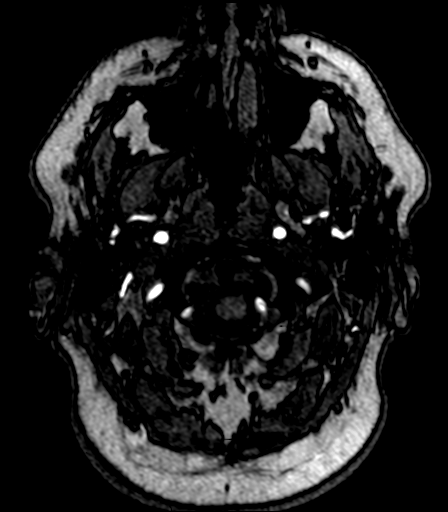
[im 22/169]
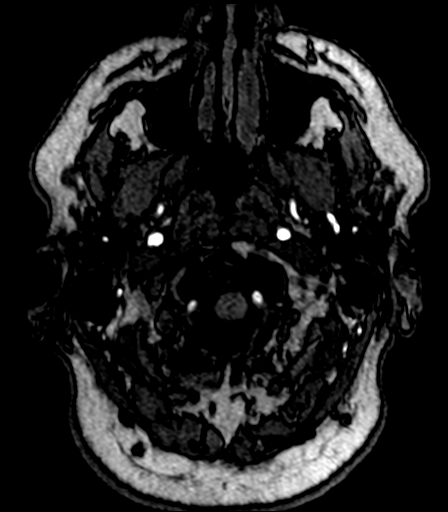
[im 26/169]
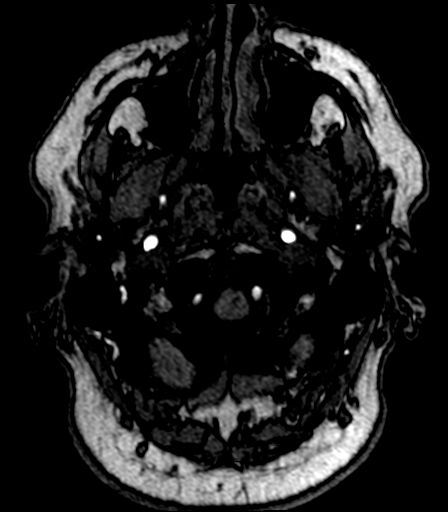
[im 29/169]
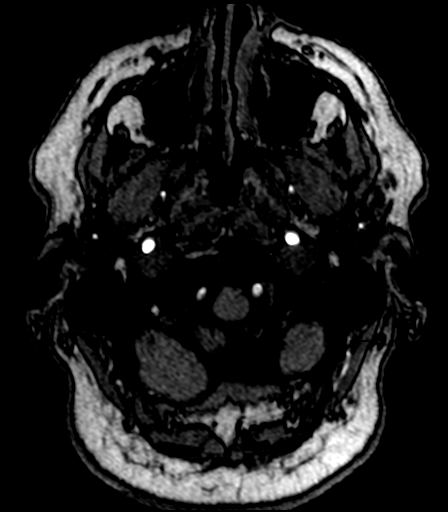
[im 33/169]
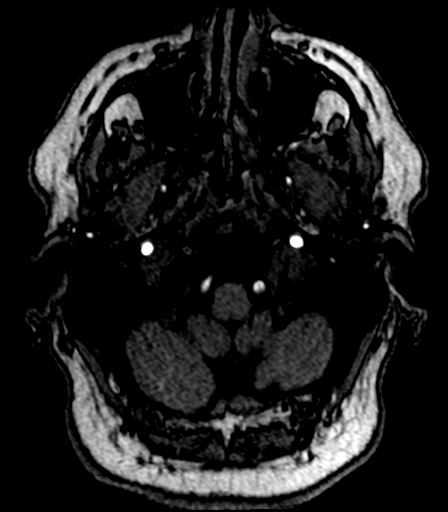
[im 36/169]
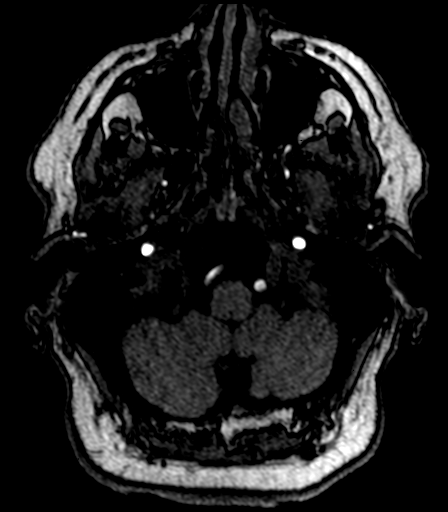
[im 40/169]
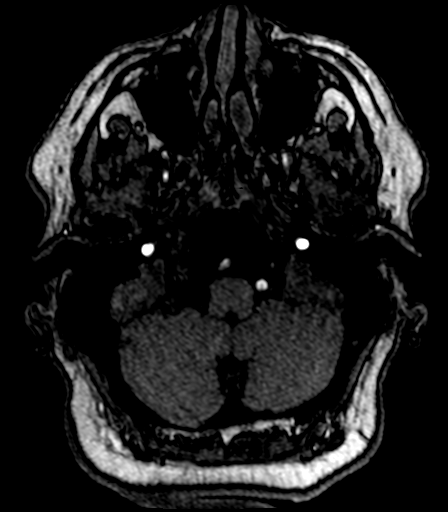
[im 43/169]
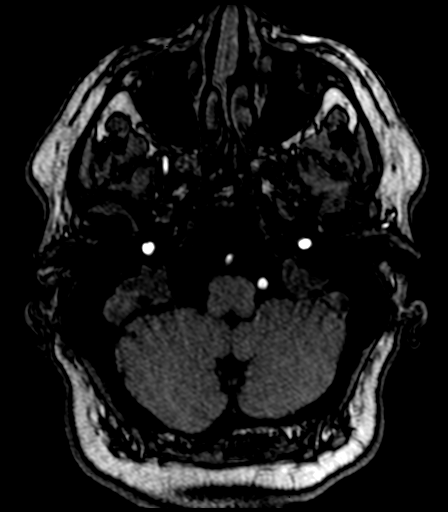
[im 47/169]
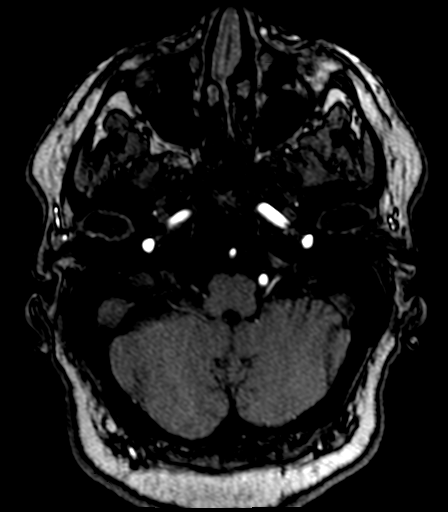
[im 51/169]
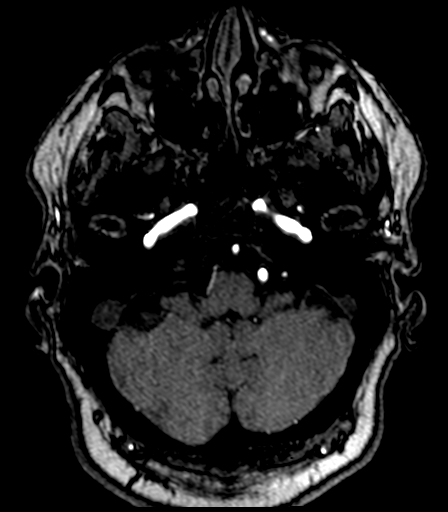
[im 54/169]
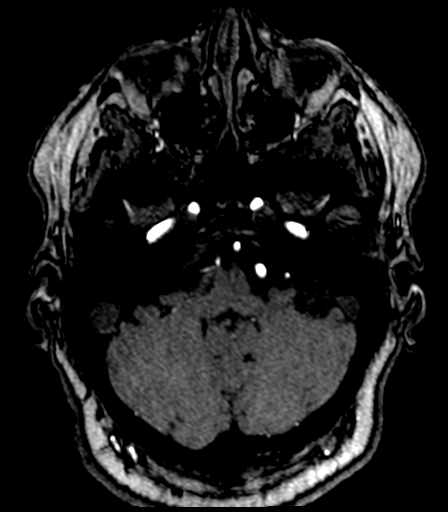
[im 76/169]
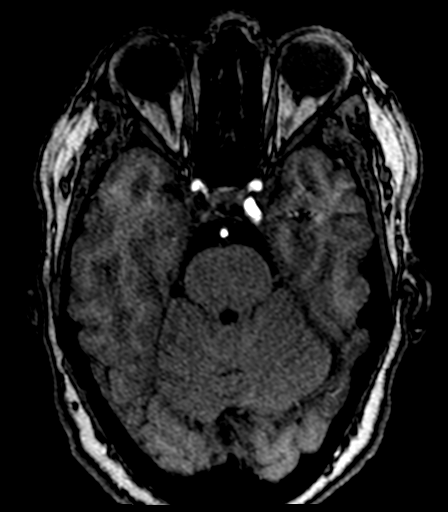
[im 86/169]
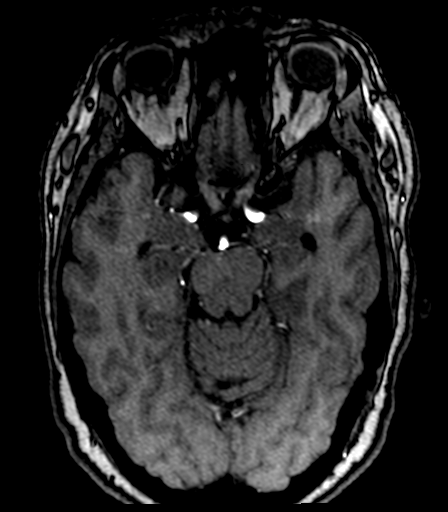
[im 97/169]
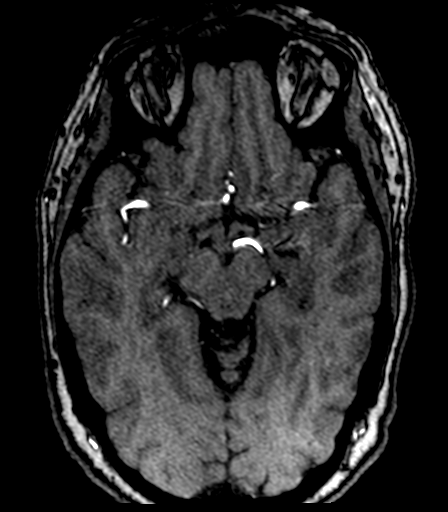
[im 118/169]
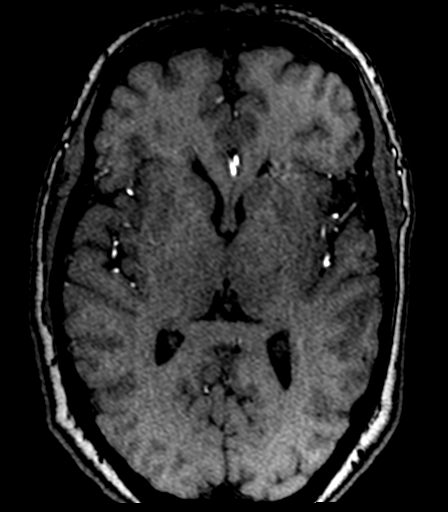
[im 140/169]
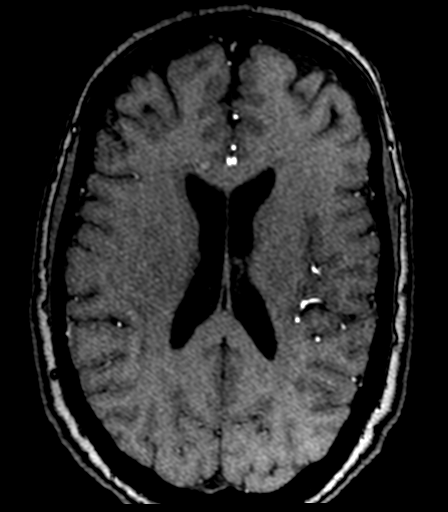
[im 143/169]
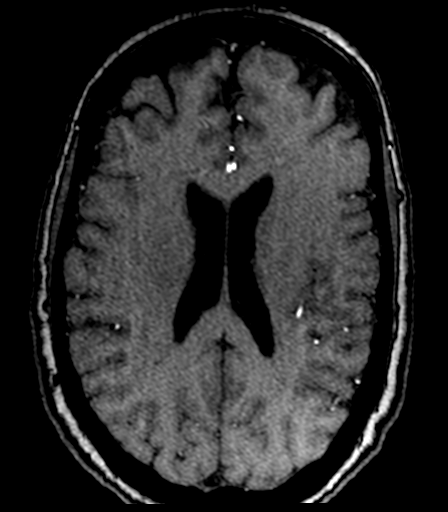
[im 161/169]
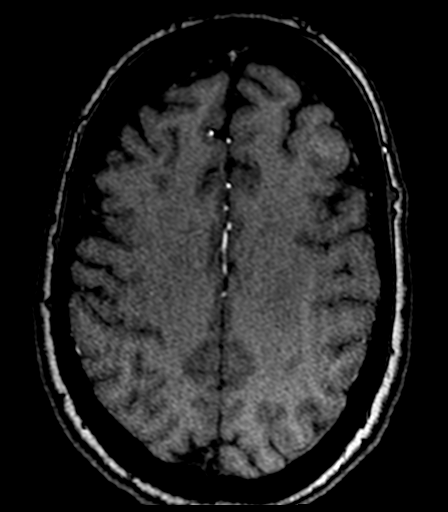

[23 of 48 positions shown; findings below may reference images not displayed]

FINDINGS: The internal carotid arteries are widely patent. The basilar artery
is widely patent. Both vertebrals contribute to formation of the
basilar, LEFT dominant. There is no proximal stenosis of the M1
middle cerebral arteries. Mild irregularity of the A1 segment, RIGHT
anterior cerebral artery, estimated 50% stenosis, codominant
contribution to the distal ACAs.

There is a 50-75% stenosis of the distal RIGHT vertebral, V4
segment. In addition, there is a 75% stenosis of the P2 segment,
RIGHT posterior cerebral artery.

No MCA M2 or M3 branch narrowing of significance.

Unremarkable cerebellar branches.

No visible saccular aneurysm.
IMPRESSION: Intracranial atherosclerotic disease with a 75% stenosis of the
RIGHT posterior cerebral artery and a 50-75% stenosis of the distal
RIGHT vertebral artery.

No proximal flow-limiting stenosis of the internal carotid arteries,
basilar artery, or M1 MCA segments.

## 2018-11-04 ENCOUNTER — Other Ambulatory Visit: Payer: Self-pay

## 2018-11-04 ENCOUNTER — Emergency Department (HOSPITAL_COMMUNITY): Payer: BLUE CROSS/BLUE SHIELD

## 2018-11-04 ENCOUNTER — Encounter (HOSPITAL_COMMUNITY): Payer: Self-pay | Admitting: Internal Medicine

## 2018-11-04 ENCOUNTER — Observation Stay (HOSPITAL_COMMUNITY)
Admission: EM | Admit: 2018-11-04 | Discharge: 2018-11-05 | Disposition: A | Discharge status: Home / Self Care | Payer: BLUE CROSS/BLUE SHIELD | Attending: Internal Medicine | Admitting: Internal Medicine

## 2018-11-04 ENCOUNTER — Ambulatory Visit (HOSPITAL_COMMUNITY): Payer: BLUE CROSS/BLUE SHIELD

## 2018-11-04 DIAGNOSIS — F329 Major depressive disorder, single episode, unspecified: Secondary | ICD-10-CM | POA: Insufficient documentation

## 2018-11-04 DIAGNOSIS — Z85820 Personal history of malignant melanoma of skin: Secondary | ICD-10-CM | POA: Insufficient documentation

## 2018-11-04 DIAGNOSIS — Z79899 Other long term (current) drug therapy: Secondary | ICD-10-CM | POA: Diagnosis not present

## 2018-11-04 DIAGNOSIS — I6601 Occlusion and stenosis of right middle cerebral artery: Secondary | ICD-10-CM | POA: Diagnosis not present

## 2018-11-04 DIAGNOSIS — R531 Weakness: Secondary | ICD-10-CM | POA: Diagnosis not present

## 2018-11-04 DIAGNOSIS — R42 Dizziness and giddiness: Secondary | ICD-10-CM | POA: Diagnosis not present

## 2018-11-04 DIAGNOSIS — I6602 Occlusion and stenosis of left middle cerebral artery: Secondary | ICD-10-CM | POA: Diagnosis not present

## 2018-11-04 DIAGNOSIS — G9389 Other specified disorders of brain: Secondary | ICD-10-CM

## 2018-11-04 DIAGNOSIS — R29898 Other symptoms and signs involving the musculoskeletal system: Secondary | ICD-10-CM | POA: Insufficient documentation

## 2018-11-04 DIAGNOSIS — R29818 Other symptoms and signs involving the nervous system: Secondary | ICD-10-CM | POA: Diagnosis not present

## 2018-11-04 DIAGNOSIS — E785 Hyperlipidemia, unspecified: Secondary | ICD-10-CM | POA: Diagnosis not present

## 2018-11-04 DIAGNOSIS — M6281 Muscle weakness (generalized): Secondary | ICD-10-CM | POA: Insufficient documentation

## 2018-11-04 DIAGNOSIS — Z8543 Personal history of malignant neoplasm of ovary: Secondary | ICD-10-CM | POA: Insufficient documentation

## 2018-11-04 DIAGNOSIS — R2681 Unsteadiness on feet: Secondary | ICD-10-CM | POA: Insufficient documentation

## 2018-11-04 DIAGNOSIS — I10 Essential (primary) hypertension: Secondary | ICD-10-CM | POA: Insufficient documentation

## 2018-11-04 DIAGNOSIS — Z6833 Body mass index (BMI) 33.0-33.9, adult: Secondary | ICD-10-CM | POA: Diagnosis not present

## 2018-11-04 DIAGNOSIS — F419 Anxiety disorder, unspecified: Secondary | ICD-10-CM | POA: Insufficient documentation

## 2018-11-04 DIAGNOSIS — Z7901 Long term (current) use of anticoagulants: Secondary | ICD-10-CM | POA: Diagnosis not present

## 2018-11-04 DIAGNOSIS — E669 Obesity, unspecified: Secondary | ICD-10-CM | POA: Insufficient documentation

## 2018-11-04 DIAGNOSIS — Z86711 Personal history of pulmonary embolism: Secondary | ICD-10-CM | POA: Insufficient documentation

## 2018-11-04 DIAGNOSIS — G459 Transient cerebral ischemic attack, unspecified: Secondary | ICD-10-CM | POA: Diagnosis not present

## 2018-11-04 DIAGNOSIS — F32A Depression, unspecified: Secondary | ICD-10-CM | POA: Diagnosis present

## 2018-11-04 DIAGNOSIS — R2990 Unspecified symptoms and signs involving the nervous system: Secondary | ICD-10-CM

## 2018-11-04 DIAGNOSIS — I639 Cerebral infarction, unspecified: Secondary | ICD-10-CM | POA: Diagnosis not present

## 2018-11-04 DIAGNOSIS — I672 Cerebral atherosclerosis: Secondary | ICD-10-CM | POA: Insufficient documentation

## 2018-11-04 DIAGNOSIS — Z880 Allergy status to penicillin: Secondary | ICD-10-CM | POA: Insufficient documentation

## 2018-11-04 DIAGNOSIS — R29810 Facial weakness: Secondary | ICD-10-CM | POA: Diagnosis not present

## 2018-11-04 HISTORY — DX: Personal history of other diseases of the circulatory system: Z86.79

## 2018-11-04 HISTORY — DX: Cerebral infarction, unspecified: I63.9

## 2018-11-04 HISTORY — DX: Essential (primary) hypertension: I10

## 2018-11-04 LAB — URINALYSIS, ROUTINE W REFLEX MICROSCOPIC
Bilirubin Urine: NEGATIVE
Glucose, UA: NEGATIVE mg/dL
Hgb urine dipstick: NEGATIVE
Ketones, ur: NEGATIVE mg/dL
Leukocytes, UA: NEGATIVE
Nitrite: NEGATIVE
Protein, ur: NEGATIVE mg/dL
Specific Gravity, Urine: 1.02 (ref 1.005–1.030)
pH: 8 (ref 5.0–8.0)

## 2018-11-04 LAB — I-STAT BETA HCG BLOOD, ED (MC, WL, AP ONLY): I-stat hCG, quantitative: <5 m[IU]/mL (ref <5)

## 2018-11-04 LAB — DIFFERENTIAL
Abs Immature Granulocytes: 0.03 10*3/uL (ref 0.00–0.07)
Basophils Absolute: 0.1 10*3/uL (ref 0.0–0.1)
Basophils Relative: 1 %
Eosinophils Absolute: 0.2 10*3/uL (ref 0.0–0.5)
Eosinophils Relative: 2 %
Immature Granulocytes: 0 %
Lymphocytes Relative: 21 %
Lymphs Abs: 2 10*3/uL (ref 0.7–4.0)
Monocytes Absolute: 0.7 10*3/uL (ref 0.1–1.0)
Monocytes Relative: 8 %
Neutro Abs: 6.5 10*3/uL (ref 1.7–7.7)
Neutrophils Relative %: 68 %

## 2018-11-04 LAB — RAPID URINE DRUG SCREEN, HOSP PERFORMED
Amphetamines: NOT DETECTED
Barbiturates: NOT DETECTED
Benzodiazepines: NOT DETECTED
Cocaine: NOT DETECTED
OPIATES: NOT DETECTED
Tetrahydrocannabinol: NOT DETECTED

## 2018-11-04 LAB — ETHANOL: Alcohol, Ethyl (B): <10 mg/dL (ref <10)

## 2018-11-04 LAB — COMPREHENSIVE METABOLIC PANEL
ALT: 33 U/L (ref 0–44)
AST: 29 U/L (ref 15–41)
Albumin: 3.8 g/dL (ref 3.5–5.0)
Alkaline Phosphatase: 72 U/L (ref 38–126)
Anion gap: 9 (ref 5–15)
BUN: 9 mg/dL (ref 6–20)
CO2: 28 mmol/L (ref 22–32)
Calcium: 9.3 mg/dL (ref 8.9–10.3)
Chloride: 103 mmol/L (ref 98–111)
Creatinine, Ser: 0.72 mg/dL (ref 0.44–1.00)
GFR calc Af Amer: >60 mL/min (ref >60)
Glucose, Bld: 121 mg/dL — ABNORMAL HIGH (ref 70–99)
Potassium: 4.2 mmol/L (ref 3.5–5.1)
Sodium: 140 mmol/L (ref 135–145)
Total Bilirubin: 0.9 mg/dL (ref 0.3–1.2)
Total Protein: 7 g/dL (ref 6.5–8.1)

## 2018-11-04 LAB — CBC
HCT: 45.1 % (ref 36.0–46.0)
Hemoglobin: 15.1 g/dL — ABNORMAL HIGH (ref 12.0–15.0)
MCH: 30.6 pg (ref 26.0–34.0)
MCHC: 33.5 g/dL (ref 30.0–36.0)
MCV: 91.3 fL (ref 80.0–100.0)
Platelets: 270 10*3/uL (ref 150–400)
RBC: 4.94 MIL/uL (ref 3.87–5.11)
RDW: 13.6 % (ref 11.5–15.5)
WBC: 9.6 10*3/uL (ref 4.0–10.5)
nRBC: 0 % (ref 0.0–0.2)

## 2018-11-04 LAB — CBG MONITORING, ED: Glucose-Capillary: 116 mg/dL — ABNORMAL HIGH (ref 70–99)

## 2018-11-04 LAB — I-STAT TROPONIN, ED: TROPONIN I, POC: 0 ng/mL (ref 0.00–0.08)

## 2018-11-04 LAB — APTT: aPTT: 40 seconds — ABNORMAL HIGH (ref 24–36)

## 2018-11-04 LAB — TSH: TSH: 5.551 u[IU]/mL — ABNORMAL HIGH (ref 0.350–4.500)

## 2018-11-04 LAB — PROTIME-INR
INR: 1.62
Prothrombin Time: 19 seconds — ABNORMAL HIGH (ref 11.4–15.2)

## 2018-11-04 LAB — I-STAT CREATININE, ED: Creatinine, Ser: 0.7 mg/dL (ref 0.44–1.00)

## 2018-11-04 MED ORDER — ATORVASTATIN CALCIUM 80 MG PO TABS: 80.0000 mg | ORAL_TABLET | Freq: Every day | ORAL | Status: DC

## 2018-11-04 MED ORDER — ACETAMINOPHEN 650 MG RE SUPP: 650.0000 mg | RECTAL | Status: DC | PRN

## 2018-11-04 MED ORDER — ASPIRIN 325 MG PO TABS
325.0000 mg | ORAL_TABLET | Freq: Every day | ORAL | Status: DC
  Administered 2018-11-04 – 2018-11-05 (×2): 325 mg via ORAL
  Filled 2018-11-04 (×2): qty 1

## 2018-11-04 MED ORDER — LORAZEPAM 2 MG/ML IJ SOLN
2.0000 mg | Freq: Once | INTRAMUSCULAR | Status: AC
  Administered 2018-11-04: 2 mg via INTRAVENOUS
  Filled 2018-11-04: qty 1

## 2018-11-04 MED ORDER — ASPIRIN 300 MG RE SUPP: 300.0000 mg | Freq: Every day | RECTAL | Status: DC

## 2018-11-04 MED ORDER — ACETAMINOPHEN 325 MG PO TABS: 650.0000 mg | ORAL_TABLET | ORAL | Status: DC | PRN

## 2018-11-04 MED ORDER — SODIUM CHLORIDE 0.9 % IV SOLN
INTRAVENOUS | Status: DC
  Administered 2018-11-04 – 2018-11-05 (×2): via INTRAVENOUS

## 2018-11-04 MED ORDER — ACETAMINOPHEN 160 MG/5ML PO SOLN: 650.0000 mg | ORAL | Status: DC | PRN

## 2018-11-04 MED ORDER — IOPAMIDOL (ISOVUE-370) INJECTION 76%
50.0000 mL | Freq: Once | INTRAVENOUS | Status: AC | PRN
  Administered 2018-11-04: 50 mL via INTRAVENOUS

## 2018-11-04 MED ORDER — SENNOSIDES-DOCUSATE SODIUM 8.6-50 MG PO TABS: 1.0000 | ORAL_TABLET | Freq: Every evening | ORAL | Status: DC | PRN

## 2018-11-04 MED ORDER — ENOXAPARIN SODIUM 40 MG/0.4ML ~~LOC~~ SOLN
40.0000 mg | SUBCUTANEOUS | Status: DC
  Filled 2018-11-04: qty 0.4

## 2018-11-04 MED ORDER — STROKE: EARLY STAGES OF RECOVERY BOOK
Freq: Once | Status: AC
  Administered 2018-11-04: 16:00:00
  Filled 2018-11-04: qty 1

## 2018-11-04 NOTE — H&P (Signed)
History and Physical    Kimberly Monroe BPZ:[id number] DOB: December 21, 1960 DOA: 11/04/2018  PCP: Eulas Post, MD Consultants:  Marin Olp - oncology; Naaman Plummer - neurology Patient coming from:  Home - lives with husband; NOK: Husband, [phone]  Chief Complaint: Code stroke  HPI: Kimberly Monroe is a 58 y.o. female with medical history significant of ovarian CA and PE presenting with code stroke. She was feeling funny and off since yesterday.  She was having difficulty writing numbers on the board.  It was still coming on overnight.  Her smile was irregular this AM and she was gagging.  Her BP on arrival was super high.  She passed her swallow evaluation but feels like she is leaking some from her mouth.  She had some word finding difficulty and was very fatigued.  She was shaky especially on the right side.  Her gait was a bit off.   ED Course:  Stroke-like symptoms starting at 7AM - facial droop and RUE numbness and weakness.  On Xarelto - no tPA.  Symptoms rapidly improved.  MRI ordered to determine encephalopathy vs. Stroke.  Review of Systems: As per HPI; otherwise review of systems reviewed and negative.   Ambulatory Status:  Ambulates without assistance  Past Medical History:  Diagnosis Date  . Allergy    seasonal  . Anemia   . Anxiety   . Basal cell cancer   . Depression   . History of blood clotting disorder   . Hx of cerebral artery stenosis   . Hypertension   . Melanoma ([place]) 2009   insitu  . Menorrhagia 02/10/2013  . Migraine    history of/none in years  . Ovarian cancer ([place]) 1995   left ovary  . Pulmonary embolism ([place])   . Squamous cell carcinoma   . Stroke [facility])     Past Surgical History:  Procedure Laterality Date  . ABDOMINAL HYSTERECTOMY  03/06/2013  . BREAST DUCTAL SYSTEM EXCISION  2009   L intraductal papilloma  . [employer]   twins, singleton  . laparoscopy with ovarian cystectomy  1994   ovarian torsion  . 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 MOHS SURGERY  2009   Dr Link Snuffer    Social History   Socioeconomic History  . Marital status: Married    Spouse name: Not on file  . Number of children: Not on file  . Years of education: Not on file  . Highest education level: Not on file  Occupational History  . Occupation: physical therapist  Social Needs  . Financial resource strain: Not on file  . Food insecurity:    Worry: Not on file    Inability: Not on file  . Transportation needs:    Medical: Not on file    Non-medical: Not on file  Tobacco Use  . Smoking status: Never Smoker  . Smokeless tobacco: Never Used  . Tobacco comment: never used cigarettes  Substance and Sexual Activity  . Alcohol use: Yes    Alcohol/week: 0.0 standard drinks    Comment: wine-occassionally  . Drug use: No  . Sexual activity: Not on file  Lifestyle  . Physical activity:    Days per week: Not on file    Minutes per session: Not on file  . Stress: Not on file  Relationships  . Social connections:    Talks on phone: Not on file    Gets together: Not on file    Attends religious  service: Not on file    Active member of club or organization: Not on file    Attends meetings of clubs or organizations: Not on file    Relationship status: Not on file  . Intimate partner violence:    Fear of current or ex partner: Not on file    Emotionally abused: Not on file    Physically abused: Not on file    Forced sexual activity: Not on file  Other Topics Concern  . Not on file  Social History Narrative  . Not on file    Allergies  Allergen Reactions  . Lime Flavor [Flavoring Agent] Anaphylaxis  . Amoxicillin Other (See Comments)    Patient stated,"my mom told me to never take it. I don't even know what type of reaction."  . Penicillins Other (See Comments)    Did it involve swelling of the face/tongue/throat, SOB, or low BP? Unknown Did it involve sudden or severe rash/hives, skin peeling, or any reaction on the  inside of your mouth or nose? Unknown Did you need to seek medical attention at a hospital or doctor's office? Unknown When did it last happen? Childhood allergy If all above answers are "NO", may proceed with cephalosporin use.   Patient stated,"I was told not to take it."     Family History  Problem Relation Age of Onset  . Cancer Father 28       colon cancer  . Colon cancer Father   . Breast cancer Mother 70  . Cancer Paternal Uncle        prostate  . Stroke Sister 32    Prior to Admission medications   Medication Sig Start Date End Date Taking? Authorizing Provider  atorvastatin (LIPITOR) 20 MG tablet Take 1 tablet (20 mg total) by mouth daily. 01/19/18   Burchette, Alinda Sierras, MD  benzonatate (TESSALON) 100 MG capsule Take 2 capsules (200 mg total) by mouth 2 (two) times daily as needed for cough. 10/13/18   [place], Betty G, MD  lisinopril (PRINIVIL,ZESTRIL) 10 MG tablet TAKE 1 TABLET (10 MG TOTAL) BY MOUTH DAILY. 09/10/18   Burchette, Alinda Sierras, MD  rivaroxaban (XARELTO) 20 MG TABS tablet Take 1 tablet (20 mg total) by mouth daily with supper. 06/10/18   [place], Holli Humbles, NP  Vitamin D, Ergocalciferol, (DRISDOL) 50000 units CAPS capsule TAKE ONE CAPSULE BY MOUTH WEEKLY AS ONE DOSE 04/02/18   Eliezer Bottom, NP    Physical Exam: Vitals:   11/04/18 1456 11/04/18 1458 11/04/18 1459 11/04/18 1600  BP: 107/80 (!) 136/117 113/88 102/63  Pulse: 94 (!) 110 93 66  Resp: 18  18 16   Temp:    97.9 F (36.6 C)  TempSrc:    Oral  SpO2: 96%  96% 92%  Weight:      Height:         . General:  Appears calm and comfortable and is NAD . Eyes:  PERRL, EOMI, normal lids, iris . ENT:  grossly normal hearing, lips & tongue, mmm . Neck:  no LAD, masses or thyromegaly; no carotid bruits . Cardiovascular:  RRR, no m/r/g. No LE edema.  Marland Kitchen Respiratory:   CTA bilaterally with no wheezes/rales/rhonchi.  Normal respiratory effort. . Abdomen:  soft, NT, ND, NABS . Skin:  no rash or  induration seen on limited exam . Musculoskeletal:  grossly normal tone BUE/BLE, good ROM, no bony abnormality. Marland Kitchen Psychiatric:  grossly normal mood and affect, speech fluent and appropriate, AOx3 . Neurologic:  CN  2-12 grossly intact with mild right facial droop which may be effort-related, moves all extremities in coordinated fashion with possible RLE mild weakness vs. effort, sensation intact other than mildly decreased sensation in R V3    Radiological Exams on Admission: Ct Angio Head W Or Wo Contrast  Result Date: 11/04/2018 CLINICAL DATA:  Transient right facial droop. Decreased sensation on the right face. History of pulmonary embolism on Xarelto. EXAM: CT ANGIOGRAPHY HEAD AND NECK TECHNIQUE: Multidetector CT imaging of the head and neck was performed using the standard protocol during bolus administration of intravenous contrast. Multiplanar CT image reconstructions and MIPs were obtained to evaluate the vascular anatomy. Carotid stenosis measurements (when applicable) are obtained utilizing NASCET criteria, using the distal internal carotid diameter as the denominator. CONTRAST:  37mL ISOVUE-370 IOPAMIDOL (ISOVUE-370) INJECTION 76% COMPARISON:  Head MRA 12/29/2017 FINDINGS: CTA NECK FINDINGS Aortic arch: Standard 3 vessel aortic arch with widely patent arch vessel origins. Right carotid system: Patent without evidence of stenosis or dissection. Left carotid system: Patent without evidence of stenosis or dissection. Vertebral arteries: Patent with the left being minimally larger than the right. No evidence of stenosis or dissection. Skeleton: Mild-to-moderate cervical disc and facet degeneration. Other neck: No evidence of acute abnormality or mass. Upper chest: Clear lung apices. Review of the MIP images confirms the above findings CTA HEAD FINDINGS Anterior circulation: The internal carotid arteries are patent from skull base to carotid termini with minimal atherosclerotic plaque not resulting in  significant stenosis. ACAs and MCAs are patent without evidence of significant A1 or M1 stenosis. A severe stenosis of the proximal left M2 superior division is new. Diffuse distal branch vessel irregularity and attenuation is felt to be largely artifactual given normal appearance on the prior MRA. No aneurysm is identified. Posterior circulation: The intracranial vertebral arteries are patent to the basilar with mild right V4 stenosis noted. Patent left PICA and right AICA origins are identified. The basilar artery is widely patent. Both PCAs are patent with similar appearance of a severe proximal right P2 stenosis. No aneurysm is identified. Venous sinuses: Patent. Anatomic variants: None. Review of the MIP images confirms the above findings IMPRESSION: 1. No emergent large vessel occlusion. 2. Intracranial atherosclerosis with new severe proximal left M2 stenosis and unchanged severe proximal right P2 stenosis. 3. Widely patent cervical carotid and vertebral arteries. Electronically Signed   By: Logan Bores M.D.   On: 11/04/2018 09:26   Ct Angio Neck W Or Wo Contrast  Result Date: 11/04/2018 CLINICAL DATA:  Transient right facial droop. Decreased sensation on the right face. History of pulmonary embolism on Xarelto. EXAM: CT ANGIOGRAPHY HEAD AND NECK TECHNIQUE: Multidetector CT imaging of the head and neck was performed using the standard protocol during bolus administration of intravenous contrast. Multiplanar CT image reconstructions and MIPs were obtained to evaluate the vascular anatomy. Carotid stenosis measurements (when applicable) are obtained utilizing NASCET criteria, using the distal internal carotid diameter as the denominator. CONTRAST:  31mL ISOVUE-370 IOPAMIDOL (ISOVUE-370) INJECTION 76% COMPARISON:  Head MRA 12/29/2017 FINDINGS: CTA NECK FINDINGS Aortic arch: Standard 3 vessel aortic arch with widely patent arch vessel origins. Right carotid system: Patent without evidence of stenosis or  dissection. Left carotid system: Patent without evidence of stenosis or dissection. Vertebral arteries: Patent with the left being minimally larger than the right. No evidence of stenosis or dissection. Skeleton: Mild-to-moderate cervical disc and facet degeneration. Other neck: No evidence of acute abnormality or mass. Upper chest: Clear lung apices. Review of the  MIP images confirms the above findings CTA HEAD FINDINGS Anterior circulation: The internal carotid arteries are patent from skull base to carotid termini with minimal atherosclerotic plaque not resulting in significant stenosis. ACAs and MCAs are patent without evidence of significant A1 or M1 stenosis. A severe stenosis of the proximal left M2 superior division is new. Diffuse distal branch vessel irregularity and attenuation is felt to be largely artifactual given normal appearance on the prior MRA. No aneurysm is identified. Posterior circulation: The intracranial vertebral arteries are patent to the basilar with mild right V4 stenosis noted. Patent left PICA and right AICA origins are identified. The basilar artery is widely patent. Both PCAs are patent with similar appearance of a severe proximal right P2 stenosis. No aneurysm is identified. Venous sinuses: Patent. Anatomic variants: None. Review of the MIP images confirms the above findings IMPRESSION: 1. No emergent large vessel occlusion. 2. Intracranial atherosclerosis with new severe proximal left M2 stenosis and unchanged severe proximal right P2 stenosis. 3. Widely patent cervical carotid and vertebral arteries. Electronically Signed   By: Logan Bores M.D.   On: 11/04/2018 09:26   Mr Brain Wo Contrast  Result Date: 11/04/2018 CLINICAL DATA:  Transient right facial droop. Decreased sensation on the right face. History of pulmonary embolism on Xarelto. EXAM: MRI HEAD WITHOUT CONTRAST TECHNIQUE: Multiplanar, multiecho pulse sequences of the brain and surrounding structures were obtained  without intravenous contrast. COMPARISON:  Head CT 11/04/2018 and MRI 01/11/2018 and 01/14/2018 FINDINGS: Brain: There is no evidence of acute infarct, intracranial hemorrhage, midline shift, or extra-axial fluid collection. The ventricles and sulci are within normal limits for age. A 1.1 cm focus of T2/FLAIR hyperintensity in the posterior aspect of the left superior frontal gyrus is unchanged from the prior MRI. This predominantly involves subcortical white matter though there may be some cortical involvement as well. The gyrus is subtly expanded. Smaller foci of T2 hyperintensity scattered elsewhere throughout the cerebral white matter bilaterally are unchanged and nonspecific but compatible with mild chronic small vessel ischemic disease. Vascular: Major intracranial vascular flow voids are preserved. Skull and upper cervical spine: Unremarkable bone marrow signal. Sinuses/Orbits: Unremarkable orbits. Trace left mastoid effusion. Clear paranasal sinuses. Other: None. IMPRESSION: 1. No acute intracranial abnormality. 2. Unchanged 1.1 cm T2 hyperintense lesion in the left superior frontal gyrus. This remains indeterminate, however stability from 01/2018 excludes infarct as an etiology. A low grade neoplasm remains a possibility, and continued follow-up is warranted. 3. Mild chronic small vessel ischemic disease. Electronically Signed   By: Logan Bores M.D.   On: 11/04/2018 10:21   Ct Head Code Stroke Wo Contrast  Result Date: 11/04/2018 CLINICAL DATA:  Code stroke. Focal neuro deficit with stroke suspected. A deficit is not provided nor noted in the chart at this time. EXAM: CT HEAD WITHOUT CONTRAST TECHNIQUE: Contiguous axial images were obtained from the base of the skull through the vertex without intravenous contrast. COMPARISON:  Brain MRI 01/11/2018 FINDINGS: Brain: No evidence of acute infarction, hemorrhage, hydrocephalus, extra-axial collection or mass lesion/mass effect. Mild white matter disease as  seen previously. Left frontal low-density on prior MRI is not clearly seen by CT. Vascular: No hyperdense vessel or unexpected calcification. Skull: Normal. Negative for fracture or focal lesion. Sinuses/Orbits: No acute finding. Other: These results were communicated to Dr. Rory Percy at 8:53 amon 1/30/2020by text page via the [facility] messaging system. ASPECTS [facility] Stroke Program Early CT Score) Not scored with this history. IMPRESSION: 1. No acute finding. 2. Left frontal lesion  on April 2019 brain MRI is not evident on this study, recommend follow-up brain MRI. Electronically Signed   By: Monte Fantasia M.D.   On: 11/04/2018 08:54    EKG: Independently reviewed.  NSR with rate 72; no evidence of acute ischemia   Labs on Admission: I have personally reviewed the available labs and imaging studies at the time of the admission.  Pertinent labs:   Glucose 121 Unremarkable CBC INR 1.62 ETOH <10  Assessment/Plan Principal Problem:   TIA (transient ischemic attack) Active Problems:   Depression   History of pulmonary embolism   Essential hypertension   TIA -Patient presenting with disorientation and neurologic symptoms concerning for for TIA/CVA -Exam somewhat suggestive of residual deficits -CTA with severe stenosis although MRI is negative for ischemia -Will place in observation status for CVA/TIA evaluation -Telemetry monitoring -Echo -Risk stratification with FLP, A1c; will also check TSH and UDS -ASA daily -Neurology consult -PT/OT/ST/Nutrition Consults -IR has seen the patient and reviewed the films; Dr. Estanislado Pandy is planning for a diagnostic cerebral angiogram tomorrow AM  L frontal lobe abnormality -Stable, but possibly a low-grade neoplasm -Needs ongoing f/u, but likely unrelated to presenting complaint  HTN -Allow permissive HTN for now -Treat BP only if >220/120, and then with goal of 15% reduction -Hold ACE and plan to restart in 48-72 hours   H/o PE -Holding  Xarelto for angiogram in AM   Depression -h/o SI in the past but currently she does not appear to be taking medications for this issue    DVT prophylaxis:  SCDs Code Status: Full - confirmed with patient/family Family Communication: Husband, friend present throughout evaluation Disposition Plan:  Home once clinically improved Consults called: Neurology; IR; PT/OT/ST/Nutrition  Admission status: It is my clinical opinion that referral for OBSERVATION is reasonable and necessary in this patient based on the above information provided. The aforementioned taken together are felt to place the patient at high risk for further clinical deterioration. However it is anticipated that the patient may be medically stable for discharge from the hospital within 24 to 48 hours.     Karmen Bongo MD [facility]   How to contact the [facility] Attending or Consulting provider [place] or covering provider during after hours [place], for this patient?  1. Check the care team in [facility] and look for a) attending/consulting [facility] provider listed and b) the [facility] team listed 2. Log into [email or web address] and use [place]'s universal password to access. If you do not have the password, please contact the hospital operator. 3. Locate the [facility] provider you are looking for under [facility] and page to a number that you can be directly reached. 4. If you still have difficulty reaching the provider, please page the [facility] (Director on Call) for the Hospitalists listed on amion for assistance.   11/04/2018, 4:47 PM

## 2018-11-04 NOTE — ED Triage Notes (Signed)
Pt arrives from home with complaints of right sided facial droop as noted by a friend at [place]. LKW 0700 while pt was getting ready for work. Pt presents to ED with right sided weakness, takes xarelto. A&Ox4.

## 2018-11-04 NOTE — ED Provider Notes (Signed)
[facility] EMERGENCY DEPARTMENT Provider Note   CSN: [id number] Arrival date & time: 11/04/18  0827     History   Chief Complaint Chief Complaint  Patient presents with  . Code Stroke    HPI Kimberly Monroe is a 58 y.o. female.  Prior to arrival patient with right-sided facial droop, right upper extremity weakness.  History of PE on Xarelto.  Patient denies any chest pain, shortness of breath.  No vision changes.  Symptoms appear to be improving per patient.  The history is provided by the patient.  Neurologic Problem  This is a new problem. The current episode started 1 to 2 hours ago. The problem occurs constantly. The problem has been gradually improving. Pertinent negatives include no chest pain, no abdominal pain, no headaches and no shortness of breath. Nothing aggravates the symptoms. Nothing relieves the symptoms. She has tried nothing for the symptoms. The treatment provided no relief.    Past Medical History:  Diagnosis Date  . Allergy    seasonal  . Anemia   . Anxiety   . Basal cell cancer   . Depression   . History of blood clotting disorder   . Melanoma ([place]) 2009   insitu  . Menorrhagia 02/10/2013  . Migraine    history of/none in years  . Ovarian cancer ([place]) 1995   left ovary  . Pulmonary embolism ([place])   . Squamous cell carcinoma     Patient Active Problem List   Diagnosis Date Noted  . CVA (cerebral vascular accident) ([place]) 03/29/2018  . Essential hypertension 06/20/2016  . Suicidal ideation 09/20/2013  . MDD (major depressive disorder) 09/20/2013  . Menorrhagia 02/10/2013  . Pulmonary embolism ([place]) 09/09/2012  . History of pulmonary embolism 09/06/2012  . Acute respiratory failure with hypoxia ([place]) 02/04/2012  . Bilateral pulmonary embolism ([place]) 02/04/2012  . Hypokalemia 02/04/2012  . Hyponatremia 02/04/2012  . Elevation of cardiac enzymes 02/04/2012  . Family History of hypercoagulable state 02/04/2012  . History of  squamous cell carcinoma of skin 12/17/2011  . History  of basal cell carcinoma 12/17/2011  . Depression 12/17/2011  . Iron deficiency anemia - severe 12/17/2011  . Melanoma [facility])     Past Surgical History:  Procedure Laterality Date  . ABDOMINAL HYSTERECTOMY  03/06/2013  . BREAST DUCTAL SYSTEM EXCISION  2009   L intraductal papilloma  . [place]   twins, singleton  . laparoscopy with ovarian cystectomy  1994   ovarian torsion  . 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 MOHS SURGERY  2009   Dr Link Snuffer     OB History    Gravida  2   Para  2   Term  1   Preterm  1   AB      Living  3     SAB      TAB      Ectopic      Multiple  1   Live Births               Home Medications    Prior to Admission medications   Medication Sig Start Date End Date Taking? Authorizing Provider  atorvastatin (LIPITOR) 20 MG tablet Take 1 tablet (20 mg total) by mouth daily. 01/19/18   Burchette, Alinda Sierras, MD  benzonatate (TESSALON) 100 MG capsule Take 2 capsules (200 mg total) by mouth 2 (two) times daily as needed for cough. 10/13/18   [place], Betty G,  MD  lisinopril (PRINIVIL,ZESTRIL) 10 MG tablet TAKE 1 TABLET (10 MG TOTAL) BY MOUTH DAILY. 09/10/18   Burchette, Alinda Sierras, MD  rivaroxaban (XARELTO) 20 MG TABS tablet Take 1 tablet (20 mg total) by mouth daily with supper. 06/10/18   [place], Holli Humbles, NP  Vitamin D, Ergocalciferol, (DRISDOL) 50000 units CAPS capsule TAKE ONE CAPSULE BY MOUTH WEEKLY AS ONE DOSE 04/02/18   [place], Holli Humbles, NP    Family History Family History  Problem Relation Age of Onset  . Cancer Father 56       colon cancer  . Colon cancer Father   . Breast cancer Mother 54  . Cancer Paternal Uncle        prostate    Social History Social History   Tobacco Use  . Smoking status: Never Smoker  . Smokeless tobacco: Never Used  . Tobacco comment: never used cigarettes  Substance Use Topics  . Alcohol use: Yes    Alcohol/week: 0.0  standard drinks    Comment: wine-occassionally  . Drug use: No     Allergies   Lime flavor [flavoring agent]; Amoxicillin; and Penicillins   Review of Systems Review of Systems  Constitutional: Negative for chills and fever.  HENT: Negative for ear pain and sore throat.   Eyes: Negative for pain and visual disturbance.  Respiratory: Negative for cough and shortness of breath.   Cardiovascular: Negative for chest pain and palpitations.  Gastrointestinal: Negative for abdominal pain and vomiting.  Genitourinary: Negative for dysuria and hematuria.  Musculoskeletal: Negative for arthralgias and back pain.  Skin: Negative for color change and rash.  Neurological: Positive for facial asymmetry, weakness and numbness. Negative for seizures, syncope and headaches.  All other systems reviewed and are negative.    Physical Exam Updated Vital Signs LMP 01/24/2013   Physical Exam Vitals signs and nursing note reviewed.  Constitutional:      General: She is not in acute distress.    Appearance: She is well-developed. She is not ill-appearing.  HENT:     Head: Normocephalic and atraumatic.     Nose: Nose normal.     Mouth/Throat:     Mouth: Mucous membranes are moist.  Eyes:     Extraocular Movements: Extraocular movements intact.     Conjunctiva/sclera: Conjunctivae normal.     Pupils: Pupils are equal, round, and reactive to light.  Neck:     Musculoskeletal: Neck supple.  Cardiovascular:     Rate and Rhythm: Normal rate and regular rhythm.     Pulses: Normal pulses.     Heart sounds: Normal heart sounds. No murmur.  Pulmonary:     Effort: Pulmonary effort is normal. No respiratory distress.     Breath sounds: Normal breath sounds.  Abdominal:     Palpations: Abdomen is soft.     Tenderness: There is no abdominal tenderness.  Musculoskeletal: Normal range of motion.     Right lower leg: No edema.     Left lower leg: No edema.  Skin:    General: Skin is warm and dry.    Neurological:     General: No focal deficit present.     Mental Status: She is alert and oriented to person, place, and time.     Cranial Nerves: No cranial nerve deficit.     Sensory: Sensory deficit present.     Motor: No weakness.     Coordination: Coordination normal.     Comments: 5+ out of 5 strength, questionable right-sided facial  weakness, normal sensation except for some decreased sensation to right side of face, no drift, normal finger-to-nose finger, no visual field deficits      ED Treatments / Results  Labs (all labs ordered are listed, but only abnormal results are displayed) Labs Reviewed  PROTIME-INR - Abnormal; Notable for the following components:      Result Value   Prothrombin Time 19.0 (*)    All other components within normal limits  APTT - Abnormal; Notable for the following components:   aPTT 40 (*)    All other components within normal limits  CBC - Abnormal; Notable for the following components:   Hemoglobin 15.1 (*)    All other components within normal limits  COMPREHENSIVE METABOLIC PANEL - Abnormal; Notable for the following components:   Glucose, Bld 121 (*)    All other components within normal limits  CBG MONITORING, ED - Abnormal; Notable for the following components:   Glucose-Capillary 116 (*)    All other components within normal limits  ETHANOL  DIFFERENTIAL  RAPID URINE DRUG SCREEN, HOSP PERFORMED  URINALYSIS, ROUTINE W REFLEX MICROSCOPIC  I-STAT TROPONIN, ED  I-STAT BETA HCG BLOOD, ED (MC, WL, AP ONLY)  I-STAT CREATININE, ED    EKG EKG Interpretation  Date/Time:  Thursday November 04 2018 09:09:29 EST Ventricular Rate:  72 PR Interval:    QRS Duration: 99 QT Interval:  455 QTC Calculation: 498 R Axis:   17 Text Interpretation:  Sinus rhythm Borderline prolonged QT interval Confirmed by Lennice Sites [phone]) on 11/04/2018 9:18:47 AM   Radiology Ct Head Code Stroke Wo Contrast  Result Date: 11/04/2018 CLINICAL DATA:   Code stroke. Focal neuro deficit with stroke suspected. A deficit is not provided nor noted in the chart at this time. EXAM: CT HEAD WITHOUT CONTRAST TECHNIQUE: Contiguous axial images were obtained from the base of the skull through the vertex without intravenous contrast. COMPARISON:  Brain MRI 01/11/2018 FINDINGS: Brain: No evidence of acute infarction, hemorrhage, hydrocephalus, extra-axial collection or mass lesion/mass effect. Mild white matter disease as seen previously. Left frontal low-density on prior MRI is not clearly seen by CT. Vascular: No hyperdense vessel or unexpected calcification. Skull: Normal. Negative for fracture or focal lesion. Sinuses/Orbits: No acute finding. Other: These results were communicated to Dr. Rory Percy at 8:53 amon 1/30/2020by text page via the [facility] messaging system. ASPECTS [facility] Stroke Program Early CT Score) Not scored with this history. IMPRESSION: 1. No acute finding. 2. Left frontal lesion on April 2019 brain MRI is not evident on this study, recommend follow-up brain MRI. Electronically Signed   By: Monte Fantasia M.D.   On: 11/04/2018 08:54    Procedures .Critical Care Performed by: Lennice Sites, DO Authorized by: Lennice Sites, DO   Critical care provider statement:    Critical care time (minutes):  35   Critical care time was exclusive of:  Separately billable procedures and treating other patients and teaching time   Critical care was necessary to treat or prevent imminent or life-threatening deterioration of the following conditions:  CNS failure or compromise   Critical care was time spent personally by me on the following activities:  Development of treatment plan with patient or surrogate, discussions with consultants, discussions with primary provider, obtaining history from patient or surrogate, ordering and review of radiographic studies, ordering and review of laboratory studies, ordering and performing treatments and interventions, pulse  oximetry, re-evaluation of patient's condition and review of old charts   I assumed direction of critical  care for this patient from another provider in my specialty: no     (including critical care time)  Medications Ordered in ED Medications  iopamidol (ISOVUE-370) 76 % injection 50 mL (50 mLs Intravenous Contrast Given 11/04/18 0906)  LORazepam (ATIVAN) injection 2 mg (2 mg Intravenous Given 11/04/18 0918)     Initial Impression / Assessment and Plan / ED Course  I have reviewed the triage vital signs and the nursing notes.  Pertinent labs & imaging results that were available during my care of the patient were reviewed by me and considered in my medical decision making (see chart for details).     TASHEKA HOUSEMAN is a 58 year old female history of anxiety, PE on Xarelto, migraine, depression who presents the ED with strokelike symptoms.  Patient with unremarkable vitals.  No fever.  Patient about an hour or 2 before arrival with right-sided facial weakness, right upper extremity weakness and numbness.  Patient made code stroke upon arrival.  Patient states that she feels like she is shaking all over.  No history of seizures.  Patient went directly to the CT scan after airway, breathing, circulation was intact.  Neurology evaluated the patient in the ED.  Code stroke work-up initiated.  CT scans did not show any acute findings.  There was a prior left frontal lesion found on brain MRI does not found on CT scan.  Patient is on Xarelto.  Symptoms appear to be gradually improving and due to being on Xarelto will hold off on any TPA.  Patient with lab work that showed no significant anemia, electrolyte abnormality, kidney injury.  EKG unremarkable.  Troponin within normal limit.  Patient to be admitted to hospitalist for further stroke work-up including MRI.  Hemodynamically stable throughout my care.  This chart was dictated using voice recognition software.  Despite best efforts to proofread,   errors can occur which can change the documentation meaning.    Final Clinical Impressions(s) / ED Diagnoses   Final diagnoses:  Stroke-like symptoms    ED Discharge Orders    None       Lennice Sites, DO 11/04/18 5277

## 2018-11-04 NOTE — Progress Notes (Signed)
Pt admitted to the unit from ED; pt A&O x4; MAE x4; telemetry applied and verified; pt oriented to the unit and room; fall/safety precaution and prevention education completed. Pt voices understanding and denies any questions. Pt skin clean, dry and intact, no opened wounds or pressure ulcers noted. Skin assessment completed with second RN per protocol. Pt in bed with call light within reach. Will closely monitor. Delia Heady RN   11/04/18 1207  Vitals  Temp 98 F (36.7 C)  Temp Source Oral  BP (!) 143/91  MAP (mmHg) 97  BP Location Left Arm  BP Method Automatic  Patient Position (if appropriate) Sitting  Pulse Rate 75  Pulse Rate Source Dinamap  Resp 18  Oxygen Therapy  SpO2 100 %  O2 Device Room Air  Pain Assessment  Pain Scale 0-10  Pain Score 0  MEWS Score  MEWS RR 0  MEWS Pulse 0  MEWS Systolic 0  MEWS LOC 0  MEWS Temp 0  MEWS Score 0  MEWS Score Color Nyoka Cowden

## 2018-11-04 NOTE — ED Notes (Signed)
PT CBG 116.

## 2018-11-04 NOTE — Progress Notes (Signed)
PT informed RN of pt having dizziness and diastolic BP elevated; pt orthostatics done; pt symptomatic and reported slight dizziness whiles standing. MD notified of pt VS and PT report. Pt resting in bed with call light within reach. Will closely monitor. Delia Heady RN

## 2018-11-04 NOTE — Consult Note (Addendum)
Neurology Consultation  Reason for Consult: Stroke Referring Physician: Ronnald Nian  CC: "Just did not feel right"  History is obtained from: Patient  HPI: Kimberly Monroe is a 58 y.o. female with history of migraines, melanoma, basal cell cancer, anxiety, pulmonary embolism.  Patient states that she awoke and was getting ready for work when she suddenly felt not right.  Patient was brought in by EMS as code stroke was called.  At the bridge patient was noted to have a right facial droop which has resolved.  Patient was immediately brought to CT scan.  While in CT scan patient still endorsed a decrease sensation in the right lower face along with right-handed tremor and noted to have a right leg drift.  CT scan did show a right pontine old infarct.  As below she was seen at [facility] on September 5 of 2019.  Review of test did not show any MRI, CTA of head and neck.  Patiently is on Xarelto secondary to bilateral pulmonary embolisms.  Thus she was not a TPA candidate.  Currently patient looks very anxious but able to give a good history.    ED course CT head, CTA of head and neck   Chart review from [facility] June 10, 2018-previous MRI brain with contrast did show a 1cm signal abnormality likely represented subacute to acute infarct. We would recommend continuing to follow with your hematologist regarding your anti-coagulation. Please continue to follow with your PCP regarding blood pressure optimization.   Your symptoms of dizziness may be due to the the stenosis in your neck. We will repeat this imaging in one year. Again, continue to follow with your hematologist regarding your anti-coagulation. You should also continue Lipitor.    LKW: 7:30 AM on 11/04/2018 tpa given?: no, on Xarelto Premorbid modified Rankin scale (mRS): 0 IHSS Score: 2 for 1-right drift and leg and 1-right decreased facial sensation   ROS: A 14 point ROS was performed and is negative except as noted in the HPI.   Past Medical  History:  Diagnosis Date  . Allergy    seasonal  . Anemia   . Anxiety   . Basal cell cancer   . Depression   . History of blood clotting disorder   . Melanoma ([place]) 2009   insitu  . Menorrhagia 02/10/2013  . Migraine    history of/none in years  . Ovarian cancer ([place]) 1995   left ovary  . Pulmonary embolism ([place])   . Squamous cell carcinoma     Family History  Problem Relation Age of Onset  . Cancer Father 53       colon cancer  . Colon cancer Father   . Breast cancer Mother 69  . Cancer Paternal Uncle        prostate   Social History:   reports that she has never smoked. She has never used smokeless tobacco. She reports current alcohol use. She reports that she does not use drugs.   Medications No current facility-administered medications for this encounter.   Current Outpatient Medications:  .  atorvastatin (LIPITOR) 20 MG tablet, Take 1 tablet (20 mg total) by mouth daily., Disp: 90 tablet, Rfl: 3 .  benzonatate (TESSALON) 100 MG capsule, Take 2 capsules (200 mg total) by mouth 2 (two) times daily as needed for cough., Disp: 45 capsule, Rfl: 0 .  lisinopril (PRINIVIL,ZESTRIL) 10 MG tablet, TAKE 1 TABLET (10 MG TOTAL) BY MOUTH DAILY., Disp: 90 tablet, Rfl: 1 .  rivaroxaban (XARELTO) 20  MG TABS tablet, Take 1 tablet (20 mg total) by mouth daily with supper., Disp: 30 tablet, Rfl: 6 .  Vitamin D, Ergocalciferol, (DRISDOL) 50000 units CAPS capsule, TAKE ONE CAPSULE BY MOUTH WEEKLY AS ONE DOSE, Disp: 12 capsule, Rfl: 2   Exam: Current vital signs: LMP 01/24/2013  Vital signs in last 24 hours:    Physical Exam  Constitutional: Appears well-developed and well-nourished.  Psych: Anxious Eyes: No scleral injection HENT: No OP obstrucion Head: Normocephalic.  Cardiovascular: Normal rate and regular rhythm.  Respiratory: Effort normal, non-labored breathing GI: Soft.  No distension. There is no tenderness.  Skin: WDI  Neuro: Mental Status: Patient is awake, alert,  oriented to person, place, month, year, and situation. Patient is able to give a clear and coherent history. No signs of aphasia or neglect Cranial Nerves: II: Visual Fields are full.  III,IV, VI: EOMI without ptosis or diploplia. Pupils are equal, round, and reactive to light.   V: Facial sensation is decreased on the right fine touch  VII: Facial movement is symmetric.  VIII: hearing is intact to voice X: Uvula elevates symmetrically XI: Shoulder shrug is symmetric. XII: tongue is midline without atrophy or fasciculations.  Motor: Tone is normal. Bulk is normal. 5/5 strength was present in all four extremities.  Sensory: Sensation is symmetric to light touch and temperature in the arms and legs. Deep Tendon Reflexes: 2+ and symmetric in the biceps and patellae.  Plantars: Toes are downgoing bilaterally.  Cerebellar: FNF and HKS are intact bilaterally  Labs I have reviewed labs in epic and the results pertinent to this consultation are:   CBC    Component Value Date/Time   WBC 7.8 10/08/2018 0829   WBC 6.2 06/10/2018 0905   RBC 4.73 10/08/2018 0829   HGB 14.2 10/08/2018 0829   HGB 15.2 07/23/2016 0956   HCT 44.2 10/08/2018 0829   HCT 44.5 07/23/2016 0956   PLT 284 10/08/2018 0829   PLT 277 07/23/2016 0956   MCV 93.4 10/08/2018 0829   MCV 89 07/23/2016 0956   MCH 30.0 10/08/2018 0829   MCHC 32.1 10/08/2018 0829   RDW 13.9 10/08/2018 0829   RDW 14.0 07/23/2016 0956   LYMPHSABS 2.6 10/08/2018 0829   LYMPHSABS 2.2 07/23/2016 0956   MONOABS 0.6 10/08/2018 0829   EOSABS 0.2 10/08/2018 0829   EOSABS 0.2 07/23/2016 0956   BASOSABS 0.0 10/08/2018 0829   BASOSABS 0.0 07/23/2016 0956    CMP     Component Value Date/Time   NA 142 10/08/2018 0829   NA 141 07/23/2016 0956   K 4.6 10/08/2018 0829   K 4.2 07/23/2016 0956   CL 106 10/08/2018 0829   CO2 30 10/08/2018 0829   CO2 25 07/23/2016 0956   GLUCOSE 113 (H) 10/08/2018 0829   GLUCOSE 101 07/23/2016 0956   BUN 23  (H) 10/08/2018 0829   BUN 14.6 07/23/2016 0956   CREATININE 0.70 11/04/2018 0840   CREATININE 0.88 10/08/2018 0829   CREATININE 0.7 07/23/2016 0956   CALCIUM 9.2 10/08/2018 0829   CALCIUM 9.3 07/23/2016 0956   PROT 6.7 10/08/2018 0829   PROT 7.1 07/23/2016 0956   ALBUMIN 4.0 10/08/2018 0829   ALBUMIN 3.5 07/23/2016 0956   AST 20 10/08/2018 0829   AST 25 07/23/2016 0956   ALT 39 10/08/2018 0829   ALT 27 07/23/2016 0956   ALKPHOS 94 10/08/2018 0829   ALKPHOS 97 07/23/2016 0956   BILITOT 0.4 10/08/2018 0829   BILITOT 0.30 07/23/2016 3419  GFRNONAA >60 10/08/2018 0829   GFRAA >60 10/08/2018 0829    Lipid Panel     Component Value Date/Time   CHOL 118 02/22/2018 0739   TRIG 110.0 02/22/2018 0739   HDL 35.00 (L) 02/22/2018 0739   CHOLHDL 3 02/22/2018 0739   VLDL 22.0 02/22/2018 0739   LDLCALC 61 02/22/2018 0739     Imaging I have reviewed the images obtained:  CT-scan of the brain-no acute findings.  Left frontal lesion on April 2019 brain MRI is not evident on this study. Intracranial atherosclerosis with new severe proximal left M2 stenosis and unchanged severe proximal right P2 stenosis.  Widely patent cervical carotid and vertebral arteries.  MRI examination of the brain- no Acute stroke  Etta Quill PA-C [facility] [phone]  M-F  (9:00 am- 5:00 PM)  11/04/2018, 8:54 AM   Attending addendum Patient seen and examined as an acute code stroke. Patient has a history of known right PCA stenosis but on CT angios head and neck today has a new left M2 stenosis.  Non-flow-limiting. She was seen here for abnormal generally not feeling well and right facial droop and right-sided weakness.  Also had systolic blood pressures in the 200s. On examination, minimal deficits with some right-sided sensory decrease in right leg drift. NIH stroke scale 2. She does endorse a lot of anxiety as well. Not a candidate for TPA because of being on Xarelto and last dose  taken a few hours prior to presentation. Not a candidate for endovascular thrombectomy due to no occlusion as well as no stroke on MRI and low NIH stroke scale on presentation. I have personally reviewed the images- CT of the head unremarkable.  CTA head and neck shows right P2 stenosis that is unchanged from prior and a new proximal left M2 stenosis.     Assessment:  58 year old woman past medical history of stroke with no residual deficits, melanoma, migraines, anxiety and pulmonary embolism on Xarelto, who had a generalized sensation of not feeling well along with right facial droop and some right-sided weakness along with hypertensive urgency and an MRI unrevealing for a stroke seen as a stroke consultation. Her CTA head and neck shows a right P2 stenosis that is been unchanged from before but she has intracranial atherosclerosis and new severe proximal left M2 stenosis. The left M2 stenosis could explain the transient right-sided symptoms and this episode could be a transient ischemic attack from that. Other differentials could include hypertensive urgency.  Impression: -TIA -Hypertensive urgency  Recommend # MRI of the brain without contrast #Transthoracic Echo,  #HOLD Xarelto.  Start ASA. #Spoke with Dr. Estanislado Pandy.  Will consider diagnostic cerebral angiogram to further evaluate the MCA and the PCA stenoses. #Start or continue Atorvastatin 80 mg/other high intensity statin # BP goal: systolic <412 # HBAIC and Lipid profile # Telemetry monitoring # Frequent neuro checks # NPO until passes stroke swallow screen  # please page stroke NP  Or  PA  Or MD from 8am -4 pm  as this patient from this time will be  followed by the stroke.   You can look them up on [email or web address]  Password [email or web address]  -- Amie Portland, MD [facility] Pager: [phone] If 7pm to 7am, please call on call as listed on AMION.

## 2018-11-04 NOTE — Consult Note (Signed)
Chief Complaint: Patient was seen in consultation today for diagnostic cerebral angiogram.  Referring Physician(s): Dr. Rory Percy  Supervising Physician: Luanne Bras  Patient Status: [facility], [place] - In-pt  History of Present Illness: Kimberly Monroe is a 58 y.o. female with a past medical history significant for anxiety, depression, ovarian cancer, squamous cell carcinoma, HTN, anemia, blood clotting disorder, bilateral PE currently on Xarelto and CVA who presented to [place] ED today with complaints "not feeling right" while getting ready for work. She was brought to the ED via EMS and was noted to have right sided facial droop - code stroke was called. Right sided facial droop resolved upon presentation to ED however patient reported decreased sensation to the right side of her face with right handed tremor. She was noted to have right leg drift and SBP in the 200s upon presentation to the ED.   CT head w/o contrast showed no acute finding. CTA head/neck showed no emergent large vessel occlusion, intracranial atherosclerosis with new severe proximal left M2 stenosis and unchanged severe proximal right P2 stenosis, widely patent cervical carotid and vertebral arteries. MR brain w/o contrast showed no acute intracranial abnormality, unchanged 1.1 cm T2 hyperintense lesion in the left superior frontal gyrus - remains indeterminate, however stability from 01/2018 excludes infarct as etiology, mild chronic small vessel ischemic disease. She was admitted for further evaluation and management of possible TIA vs hypertensive urgency. A consult for possible diagnostic angiogram has been requested.  Patient reports that she has previously had a stroke in March of 2019 which was thought to be vertigo initially due to her complaints of dizziness and "trouble using my feet" - she is followed by [facility] for this. She reports that she is aware of the narrowing on the right side of her head and was told that "it's  too deep in my head to do anything about." She reports previous bilateral PE for which she was on coumadin however this was stopped and she is currently taking Xarelto instead. She reports continued bilateral hand tremors since admission, however her previous right sided neurologic symptoms have resolved.   Past Medical History:  Diagnosis Date  . Allergy    seasonal  . Anemia   . Anxiety   . Basal cell cancer   . Depression   . History of blood clotting disorder   . Hx of cerebral artery stenosis   . Hypertension   . Melanoma ([place]) 2009   insitu  . Menorrhagia 02/10/2013  . Migraine    history of/none in years  . Ovarian cancer ([place]) 1995   left ovary  . Pulmonary embolism ([place])   . Squamous cell carcinoma     Past Surgical History:  Procedure Laterality Date  . ABDOMINAL HYSTERECTOMY  03/06/2013  . BREAST DUCTAL SYSTEM EXCISION  2009   L intraductal papilloma  . [place]   twins, singleton  . laparoscopy with ovarian cystectomy  1994   ovarian torsion  . 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 MOHS SURGERY  2009   Dr Link Snuffer    Allergies: Lime flavor [flavoring agent]; Amoxicillin; and Penicillins  Medications: Prior to Admission medications   Medication Sig Start Date End Date Taking? Authorizing Provider  atorvastatin (LIPITOR) 20 MG tablet Take 1 tablet (20 mg total) by mouth daily. Patient taking differently: Take 20 mg by mouth every morning.  01/19/18  Yes Burchette, Alinda Sierras, MD  lisinopril (PRINIVIL,ZESTRIL) 10 MG tablet TAKE 1  TABLET (10 MG TOTAL) BY MOUTH DAILY. Patient taking differently: Take 10 mg by mouth every morning.  09/10/18  Yes Burchette, Alinda Sierras, MD  rivaroxaban (XARELTO) 20 MG TABS tablet Take 1 tablet (20 mg total) by mouth daily with supper. Patient taking differently: Take 20 mg by mouth every morning.  06/10/18  Yes [place], Holli Humbles, NP  Vitamin D, Ergocalciferol, (DRISDOL) 50000 units CAPS capsule TAKE ONE CAPSULE BY MOUTH  WEEKLY AS ONE DOSE Patient taking differently: Take 50,000 Units by mouth every Monday.  04/02/18  Yes [place], Holli Humbles, NP     Family History  Problem Relation Age of Onset  . Cancer Father 37       colon cancer  . Colon cancer Father   . Breast cancer Mother 44  . Cancer Paternal Uncle        prostate  . Stroke Sister 57    Social History   Socioeconomic History  . Marital status: Married    Spouse name: Not on file  . Number of children: Not on file  . Years of education: Not on file  . Highest education level: Not on file  Occupational History  . Occupation: physical therapist  Social Needs  . Financial resource strain: Not on file  . Food insecurity:    Worry: Not on file    Inability: Not on file  . Transportation needs:    Medical: Not on file    Non-medical: Not on file  Tobacco Use  . Smoking status: Never Smoker  . Smokeless tobacco: Never Used  . Tobacco comment: never used cigarettes  Substance and Sexual Activity  . Alcohol use: Yes    Alcohol/week: 0.0 standard drinks    Comment: wine-occassionally  . Drug use: No  . Sexual activity: Not on file  Lifestyle  . Physical activity:    Days per week: Not on file    Minutes per session: Not on file  . Stress: Not on file  Relationships  . Social connections:    Talks on phone: Not on file    Gets together: Not on file    Attends religious service: Not on file    Active member of club or organization: Not on file    Attends meetings of clubs or organizations: Not on file    Relationship status: Not on file  Other Topics Concern  . Not on file  Social History Narrative  . Not on file     Review of Systems: A 12 point ROS discussed and pertinent positives are indicated in the HPI above.  All other systems are negative.  Review of Systems  Constitutional: Negative for chills and fever.  HENT: Negative for hearing loss, tinnitus and trouble swallowing.   Respiratory: Negative for cough and  shortness of breath.   Cardiovascular: Negative for chest pain.  Gastrointestinal: Negative for abdominal pain, diarrhea, nausea and vomiting.  Neurological: Positive for dizziness and tremors (bilateral hands). Negative for syncope, facial asymmetry, speech difficulty, light-headedness, numbness and headaches.  Psychiatric/Behavioral: Negative for confusion.    Vital Signs: BP (!) 143/91 (BP Location: Left Arm)   Pulse 75   Temp 98 F (36.7 C) (Oral)   Resp 18   Ht 5\' 5"  (1.651 m)   Wt 200 lb (90.7 kg)   LMP 01/24/2013   SpO2 100%   BMI 33.28 kg/m   Physical Exam Vitals signs and nursing note reviewed.  Constitutional:      General: She is not in  acute distress.    Comments: Laying in bed eating lunch tray. Pleasant, talkative.  HENT:     Head: Normocephalic and atraumatic.  Cardiovascular:     Rate and Rhythm: Normal rate and regular rhythm.  Pulmonary:     Effort: Pulmonary effort is normal.     Breath sounds: Normal breath sounds.  Abdominal:     General: There is no distension.     Palpations: Abdomen is soft.     Tenderness: There is no abdominal tenderness.  Skin:    General: Skin is warm and dry.  Neurological:     Mental Status: She is alert and oriented to person, place, and time.     Comments: (+) bilateral fine hand tremor  Psychiatric:        Mood and Affect: Mood normal.        Behavior: Behavior normal.        Thought Content: Thought content normal.        Judgment: Judgment normal.      MD Evaluation Airway: WNL Heart: WNL Abdomen: WNL Chest/ Lungs: WNL ASA  Classification: 2 Mallampati/Airway Score: Two   Imaging: Ct Angio Head W Or Wo Contrast  Result Date: 11/04/2018 CLINICAL DATA:  Transient right facial droop. Decreased sensation on the right face. History of pulmonary embolism on Xarelto. EXAM: CT ANGIOGRAPHY HEAD AND NECK TECHNIQUE: Multidetector CT imaging of the head and neck was performed using the standard protocol during bolus  administration of intravenous contrast. Multiplanar CT image reconstructions and MIPs were obtained to evaluate the vascular anatomy. Carotid stenosis measurements (when applicable) are obtained utilizing NASCET criteria, using the distal internal carotid diameter as the denominator. CONTRAST:  46mL ISOVUE-370 IOPAMIDOL (ISOVUE-370) INJECTION 76% COMPARISON:  Head MRA 12/29/2017 FINDINGS: CTA NECK FINDINGS Aortic arch: Standard 3 vessel aortic arch with widely patent arch vessel origins. Right carotid system: Patent without evidence of stenosis or dissection. Left carotid system: Patent without evidence of stenosis or dissection. Vertebral arteries: Patent with the left being minimally larger than the right. No evidence of stenosis or dissection. Skeleton: Mild-to-moderate cervical disc and facet degeneration. Other neck: No evidence of acute abnormality or mass. Upper chest: Clear lung apices. Review of the MIP images confirms the above findings CTA HEAD FINDINGS Anterior circulation: The internal carotid arteries are patent from skull base to carotid termini with minimal atherosclerotic plaque not resulting in significant stenosis. ACAs and MCAs are patent without evidence of significant A1 or M1 stenosis. A severe stenosis of the proximal left M2 superior division is new. Diffuse distal branch vessel irregularity and attenuation is felt to be largely artifactual given normal appearance on the prior MRA. No aneurysm is identified. Posterior circulation: The intracranial vertebral arteries are patent to the basilar with mild right V4 stenosis noted. Patent left PICA and right AICA origins are identified. The basilar artery is widely patent. Both PCAs are patent with similar appearance of a severe proximal right P2 stenosis. No aneurysm is identified. Venous sinuses: Patent. Anatomic variants: None. Review of the MIP images confirms the above findings IMPRESSION: 1. No emergent large vessel occlusion. 2.  Intracranial atherosclerosis with new severe proximal left M2 stenosis and unchanged severe proximal right P2 stenosis. 3. Widely patent cervical carotid and vertebral arteries. Electronically Signed   By: Logan Bores M.D.   On: 11/04/2018 09:26   Ct Angio Neck W Or Wo Contrast  Result Date: 11/04/2018 CLINICAL DATA:  Transient right facial droop. Decreased sensation on the right face. History  of pulmonary embolism on Xarelto. EXAM: CT ANGIOGRAPHY HEAD AND NECK TECHNIQUE: Multidetector CT imaging of the head and neck was performed using the standard protocol during bolus administration of intravenous contrast. Multiplanar CT image reconstructions and MIPs were obtained to evaluate the vascular anatomy. Carotid stenosis measurements (when applicable) are obtained utilizing NASCET criteria, using the distal internal carotid diameter as the denominator. CONTRAST:  56mL ISOVUE-370 IOPAMIDOL (ISOVUE-370) INJECTION 76% COMPARISON:  Head MRA 12/29/2017 FINDINGS: CTA NECK FINDINGS Aortic arch: Standard 3 vessel aortic arch with widely patent arch vessel origins. Right carotid system: Patent without evidence of stenosis or dissection. Left carotid system: Patent without evidence of stenosis or dissection. Vertebral arteries: Patent with the left being minimally larger than the right. No evidence of stenosis or dissection. Skeleton: Mild-to-moderate cervical disc and facet degeneration. Other neck: No evidence of acute abnormality or mass. Upper chest: Clear lung apices. Review of the MIP images confirms the above findings CTA HEAD FINDINGS Anterior circulation: The internal carotid arteries are patent from skull base to carotid termini with minimal atherosclerotic plaque not resulting in significant stenosis. ACAs and MCAs are patent without evidence of significant A1 or M1 stenosis. A severe stenosis of the proximal left M2 superior division is new. Diffuse distal branch vessel irregularity and attenuation is felt to  be largely artifactual given normal appearance on the prior MRA. No aneurysm is identified. Posterior circulation: The intracranial vertebral arteries are patent to the basilar with mild right V4 stenosis noted. Patent left PICA and right AICA origins are identified. The basilar artery is widely patent. Both PCAs are patent with similar appearance of a severe proximal right P2 stenosis. No aneurysm is identified. Venous sinuses: Patent. Anatomic variants: None. Review of the MIP images confirms the above findings IMPRESSION: 1. No emergent large vessel occlusion. 2. Intracranial atherosclerosis with new severe proximal left M2 stenosis and unchanged severe proximal right P2 stenosis. 3. Widely patent cervical carotid and vertebral arteries. Electronically Signed   By: Logan Bores M.D.   On: 11/04/2018 09:26   Mr Brain Wo Contrast  Result Date: 11/04/2018 CLINICAL DATA:  Transient right facial droop. Decreased sensation on the right face. History of pulmonary embolism on Xarelto. EXAM: MRI HEAD WITHOUT CONTRAST TECHNIQUE: Multiplanar, multiecho pulse sequences of the brain and surrounding structures were obtained without intravenous contrast. COMPARISON:  Head CT 11/04/2018 and MRI 01/11/2018 and 01/14/2018 FINDINGS: Brain: There is no evidence of acute infarct, intracranial hemorrhage, midline shift, or extra-axial fluid collection. The ventricles and sulci are within normal limits for age. A 1.1 cm focus of T2/FLAIR hyperintensity in the posterior aspect of the left superior frontal gyrus is unchanged from the prior MRI. This predominantly involves subcortical white matter though there may be some cortical involvement as well. The gyrus is subtly expanded. Smaller foci of T2 hyperintensity scattered elsewhere throughout the cerebral white matter bilaterally are unchanged and nonspecific but compatible with mild chronic small vessel ischemic disease. Vascular: Major intracranial vascular flow voids are  preserved. Skull and upper cervical spine: Unremarkable bone marrow signal. Sinuses/Orbits: Unremarkable orbits. Trace left mastoid effusion. Clear paranasal sinuses. Other: None. IMPRESSION: 1. No acute intracranial abnormality. 2. Unchanged 1.1 cm T2 hyperintense lesion in the left superior frontal gyrus. This remains indeterminate, however stability from 01/2018 excludes infarct as an etiology. A low grade neoplasm remains a possibility, and continued follow-up is warranted. 3. Mild chronic small vessel ischemic disease. Electronically Signed   By: Logan Bores M.D.   On: 11/04/2018 10:21  Ct Head Code Stroke Wo Contrast  Result Date: 11/04/2018 CLINICAL DATA:  Code stroke. Focal neuro deficit with stroke suspected. A deficit is not provided nor noted in the chart at this time. EXAM: CT HEAD WITHOUT CONTRAST TECHNIQUE: Contiguous axial images were obtained from the base of the skull through the vertex without intravenous contrast. COMPARISON:  Brain MRI 01/11/2018 FINDINGS: Brain: No evidence of acute infarction, hemorrhage, hydrocephalus, extra-axial collection or mass lesion/mass effect. Mild white matter disease as seen previously. Left frontal low-density on prior MRI is not clearly seen by CT. Vascular: No hyperdense vessel or unexpected calcification. Skull: Normal. Negative for fracture or focal lesion. Sinuses/Orbits: No acute finding. Other: These results were communicated to Dr. Rory Percy at 8:53 amon 1/30/2020by text page via the [facility] messaging system. ASPECTS [facility] Stroke Program Early CT Score) Not scored with this history. IMPRESSION: 1. No acute finding. 2. Left frontal lesion on April 2019 brain MRI is not evident on this study, recommend follow-up brain MRI. Electronically Signed   By: Monte Fantasia M.D.   On: 11/04/2018 08:54    Labs:  CBC: Recent Labs    06/10/18 0905 07/08/18 0832 10/08/18 0829 11/04/18 0841  WBC 6.2 7.5 7.8 9.6  HGB 15.8* 14.7 14.2 15.1*  HCT 47.3* 44.7  44.2 45.1  PLT 228 240 284 270    COAGS: Recent Labs    12/15/17 1355 06/10/18 0827 11/04/18 0841  INR 1.71 1.41 1.62  APTT  --   --  40*    BMP: Recent Labs    12/15/17 1355  06/10/18 0827 07/08/18 0832 10/08/18 0829 11/04/18 0840 11/04/18 0841  NA 139  --  142 140 142  --  140  K 4.0  --  4.3 4.2 4.6  --  4.2  CL 105  --  105 110* 106  --  103  CO2 26  --  26 30 30   --  28  GLUCOSE 98  --  100* 111 113*  --  121*  BUN 12  --  18 11 23*  --  9  CALCIUM 9.1  --  10.1 9.5 9.2  --  9.3  CREATININE 0.72   < > 0.81 0.80 0.88 0.70 0.72  GFRNONAA >60  --  >60  --  >60  --  >60  GFRAA >60  --  >60  --  >60  --  >60   < > = values in this interval not displayed.    LIVER FUNCTION TESTS: Recent Labs    06/10/18 0827 07/08/18 0832 10/08/18 0829 11/04/18 0841  BILITOT 0.6 0.5 0.4 0.9  AST 33 29 20 29   ALT 38 32 39 33  ALKPHOS 99 82 94 72  PROT 7.2 6.6 6.7 7.0  ALBUMIN 3.8 3.5 4.0 3.8    TUMOR MARKERS: No results for input(s): AFPTM, CEA, CA199, CHROMGRNA in the last 8760 hours.  Assessment and Plan:  Patient with history of CVA (12/2017) followed [facility] neurology, bilateral PEs currently on Xarelto with last dose this AM who presented to [facility] ED this morning with sudden onset feeling of being unwell. She was noted to have transient right sided facial droop, right hand tremor and right leg drift. Her SBP was in the 200s upon arrival. Imaging was negative for acute intracranial process however she was noted to have intracranial atherosclerosis with a new severe proximal left M2 stenosis and unchanged previously known proximal right P2 stenosis. Request was made to [facility] for possible diagnostic  angiogram for further evaluation.  Procedure was discussed with patient by myself and Dr. Estanislado Pandy at bedside. She states understanding of procedure and wishes to proceed.  Plan for diagnostic cerebral angiogram tomorrow in IR -- likely after 10 am. Patient to be NPO after  midnight, continue to hold Xarelto, will hold further heparin/Lovenox until post procedure, pre-procedure lab work performed today, IR will call for patient when ready.  Risks and benefits of diagnostic cerebral angiogram were discussed with the patient including, but not limited to bleeding, infection, vascular injury or contrast induced renal failure.  This interventional procedure involves the use of X-rays and because of the nature of the planned procedure, it is possible that we will have prolonged use of X-ray fluoroscopy. Potential radiation risks to you include (but are not limited to) the following: - A slightly elevated risk for cancer  several years later in life. This risk is typically less than 0.5% percent. This risk is low in comparison to the normal incidence of human cancer, which is 33% for women and 50% for men according to the [place]. - Radiation induced injury can include skin redness, resembling a rash, tissue breakdown / ulcers and hair loss (which can be temporary or permanent).  The likelihood of either of these occurring depends on the difficulty of the procedure and whether you are sensitive to radiation due to previous procedures, disease, or genetic conditions.  IF your procedure requires a prolonged use of radiation, you will be notified and given written instructions for further action.  It is your responsibility to monitor the irradiated area for the 2 weeks following the procedure and to notify your physician if you are concerned that you have suffered a radiation induced injury.    All of the patient's questions were answered, patient is agreeable to proceed.  Consent signed and in IR folder.  Thank you for this interesting consult.  I greatly enjoyed meeting Jolynne C Carrell and look forward to participating in their care.  A copy of this report was sent to the requesting provider on this date.  Electronically Signed: Joaquim Nam,  PA-C 11/04/2018, 12:40 PM   I spent a total of 40 Minutes in face to face in clinical consultation, greater than 50% of which was counseling/coordinating care for diagnostic cerebral angiogram.

## 2018-11-04 NOTE — ED Notes (Signed)
[facility] called to activate code stroke 

## 2018-11-04 NOTE — Evaluation (Signed)
Physical Therapy Evaluation Patient Details Name: Kimberly Monroe MRN: [id number] DOB: 01-28-1961 Today's Date: 11/04/2018   History of Present Illness  Pt admitted with R side facial droop and R side weakness, determined to be TIA. Significant PMH of CVA.  Clinical Impression  Pt received in bed, reluctant to participate in therapy because she did not sleep last night, but agreed to participate. No pain, numbness/tingling reported, but presents with right hand tremor. She was able to move from supine to sit with S for safety, HOB elevated. Prior to standing, RN recorded WNL vitals for pt. Sit to stand transfer using RW with CGA for safety. Upon standing, pt began to sway backwards, required minA intermittently to maintain balance. Checked vitals at this time: BP- 136/117 (MAP: 125), HR 110. Pt symptomatic with vision changes and not feeling well in standing, so returned to sitting on EOB. Stopped therapy at this time due to unsafe BP change and pt feeling too unwell to continue. Pt returned to supine Mod(I). RN notified of change in BP with standing. Overall, pt's medical status limiting full assessment of mobility, so follow up recommendation home health PT but TBD depending on pt's progress.    Follow Up Recommendations Home health PT(TBD- pt's medical status (high diastolic BP with standing + vision changes) limiting full mobility assessment)    Equipment Recommendations  Other (comment)(TBD- pt's medical status limiting full mobility assessment)    Recommendations for Other Services       Precautions / Restrictions Precautions Precautions: Fall Restrictions Weight Bearing Restrictions: No      Mobility  Bed Mobility Overal bed mobility: Needs Assistance Bed Mobility: Supine to Sit;Sit to Supine     Supine to sit: Supervision;HOB elevated Sit to supine: Modified independent (Device/Increase time)   General bed mobility comments: Pt able to move supine to sit with difficulty with  [facility] raised and supervision for safety. Pt used rail of bed to assist with roll.  Transfers Overall transfer level: Needs assistance Equipment used: Rolling walker (2 wheeled) Transfers: Sit to/from Stand Sit to Stand: Min guard         General transfer comment: No physical assist provided for transfer; min guard for safety.  Ambulation/Gait Ambulation/Gait assistance: (not assessed due to unsafe vitals at standing)              Stairs            Wheelchair Mobility    Modified Rankin (Stroke Patients Only)       Balance Overall balance assessment: Needs assistance Sitting-balance support: Bilateral upper extremity supported;Feet supported Sitting balance-Leahy Scale: Fair     Standing balance support: Bilateral upper extremity supported Standing balance-Leahy Scale: Poor Standing balance comment: When standing, pt swaying backwards, requiring minA intermittently to maintain balance.                             Pertinent Vitals/Pain Pain Assessment: No/denies pain    Home Living Family/patient expects to be discharged to:: Private residence Living Arrangements: Spouse/significant other Available Help at Discharge: Available PRN/intermittently;Family Type of Home: House Home Access: Stairs to enter Entrance Stairs-Rails: Can reach both Entrance Stairs-Number of Steps: 3 Home Layout: One level Home Equipment: None      Prior Function Level of Independence: Independent               Hand Dominance        Extremity/Trunk Assessment   Upper Extremity  Assessment Upper Extremity Assessment: Defer to OT evaluation    Lower Extremity Assessment Lower Extremity Assessment: Generalized weakness       Communication   Communication: No difficulties  Cognition Arousal/Alertness: Awake/alert Behavior During Therapy: WFL for tasks assessed/performed Overall Cognitive Status: Within Functional Limits for tasks assessed                                         General Comments      Exercises     Assessment/Plan    PT Assessment Patient needs continued PT services  PT Problem List Decreased balance;Decreased strength;Decreased mobility;Decreased activity tolerance       PT Treatment Interventions Functional mobility training;Balance training;Gait training;Patient/family education;Therapeutic activities;Stair training;Therapeutic exercise    PT Goals (Current goals can be found in the Care Plan section)  Acute Rehab PT Goals Patient Stated Goal: go home PT Goal Formulation: With patient Time For Goal Achievement: 11/18/18 Potential to Achieve Goals: Good    Frequency Min 3X/week   Barriers to discharge        Co-evaluation               AM-PAC PT "6 Clicks" Mobility  Outcome Measure Help needed turning from your back to your side while in a flat bed without using bedrails?: A Little Help needed moving from lying on your back to sitting on the side of a flat bed without using bedrails?: A Little Help needed moving to and from a bed to a chair (including a wheelchair)?: A Little Help needed standing up from a chair using your arms (e.g., wheelchair or bedside chair)?: A Little Help needed to walk in hospital room?: A Little Help needed climbing 3-5 steps with a railing? : A Little 6 Click Score: 18    End of Session Equipment Utilized During Treatment: Gait belt Activity Tolerance: Treatment limited secondary to medical complications (Comment)(BP change to 136/117 when standing) Patient left: in bed;with call bell/phone within reach;with bed alarm set Nurse Communication: Mobility status;Other (comment)(change in vitals with standing) PT Visit Diagnosis: Unsteadiness on feet (R26.81);Muscle weakness (generalized) (M62.81);Other symptoms and signs involving the nervous system (R29.898);Dizziness and giddiness (R42)    Time:  -      Charges:              Ronnell Guadalajara,  SPT   Ronnell Guadalajara 11/04/2018, 3:44 PM

## 2018-11-04 NOTE — Code Documentation (Signed)
58 yo female coming from work where she was noted to have right sided facial droop this morning by a [employer]ud[employer]. Pt got dressed this morning at 0700 and had no deficits. She noted her face normal in the morning. When the staff noted the facial change, pt reported some right sided weakness. Pt came POV to the ED. Code Stroke called upon arrival to ED with triage. Stroke Team met patient in CT. Initial NIHSS 2 due to right leg drift and right sensory decreased on the right. No facial droop noted upon assessment. CT Head negative for hemorrhage. CTA/CTP completed. No evidence of LVO. Brought back to ED and placed on cardiac monitor. MRI ordered and patient taken for MRI to r/o stroke. Negative for stroke. Pt to continue work-up. Handoff given to [place], Therapist, sports.

## 2018-11-05 ENCOUNTER — Observation Stay (HOSPITAL_COMMUNITY): Payer: BLUE CROSS/BLUE SHIELD

## 2018-11-05 ENCOUNTER — Observation Stay (HOSPITAL_BASED_OUTPATIENT_CLINIC_OR_DEPARTMENT_OTHER): Payer: BLUE CROSS/BLUE SHIELD

## 2018-11-05 DIAGNOSIS — I6601 Occlusion and stenosis of right middle cerebral artery: Secondary | ICD-10-CM | POA: Diagnosis not present

## 2018-11-05 DIAGNOSIS — I6602 Occlusion and stenosis of left middle cerebral artery: Secondary | ICD-10-CM | POA: Diagnosis not present

## 2018-11-05 DIAGNOSIS — E669 Obesity, unspecified: Secondary | ICD-10-CM

## 2018-11-05 DIAGNOSIS — G9389 Other specified disorders of brain: Secondary | ICD-10-CM | POA: Diagnosis not present

## 2018-11-05 DIAGNOSIS — G459 Transient cerebral ischemic attack, unspecified: Secondary | ICD-10-CM

## 2018-11-05 DIAGNOSIS — F329 Major depressive disorder, single episode, unspecified: Secondary | ICD-10-CM

## 2018-11-05 DIAGNOSIS — R2990 Unspecified symptoms and signs involving the nervous system: Secondary | ICD-10-CM | POA: Diagnosis not present

## 2018-11-05 DIAGNOSIS — F419 Anxiety disorder, unspecified: Secondary | ICD-10-CM | POA: Diagnosis not present

## 2018-11-05 DIAGNOSIS — R200 Anesthesia of skin: Secondary | ICD-10-CM | POA: Diagnosis not present

## 2018-11-05 DIAGNOSIS — I10 Essential (primary) hypertension: Secondary | ICD-10-CM | POA: Diagnosis not present

## 2018-11-05 DIAGNOSIS — Z7901 Long term (current) use of anticoagulants: Secondary | ICD-10-CM

## 2018-11-05 DIAGNOSIS — Z86711 Personal history of pulmonary embolism: Secondary | ICD-10-CM | POA: Diagnosis not present

## 2018-11-05 DIAGNOSIS — I6621 Occlusion and stenosis of right posterior cerebral artery: Secondary | ICD-10-CM | POA: Diagnosis not present

## 2018-11-05 HISTORY — PX: IR ANGIO VERTEBRAL SEL VERTEBRAL UNI R MOD SED: IMG5368

## 2018-11-05 HISTORY — PX: IR US GUIDE VASC ACCESS RIGHT: IMG2390

## 2018-11-05 HISTORY — PX: IR ANGIO INTRA EXTRACRAN SEL COM CAROTID INNOMINATE BILAT MOD SED: IMG5360

## 2018-11-05 HISTORY — PX: IR ANGIO VERTEBRAL SEL SUBCLAVIAN INNOMINATE UNI L MOD SED: IMG5364

## 2018-11-05 LAB — ECHOCARDIOGRAM COMPLETE
Height: 65 in
WEIGHTICAEL: 3200 [oz_av]

## 2018-11-05 LAB — LIPID PANEL
CHOL/HDL RATIO: 2.8 ratio
Cholesterol: 124 mg/dL (ref 0–200)
HDL: 45 mg/dL (ref >40)
LDL Cholesterol: 61 mg/dL (ref 0–99)
Triglycerides: 91 mg/dL (ref <150)
VLDL: 18 mg/dL (ref 0–40)

## 2018-11-05 LAB — HIV ANTIBODY (ROUTINE TESTING W REFLEX): HIV Screen 4th Generation wRfx: NONREACTIVE

## 2018-11-05 LAB — HEMOGLOBIN A1C
Hgb A1c MFr Bld: 6 % — ABNORMAL HIGH (ref 4.8–5.6)
Mean Plasma Glucose: 125.5 mg/dL

## 2018-11-05 LAB — T4, FREE: Free T4: 0.84 ng/dL (ref 0.82–1.77)

## 2018-11-05 MED ORDER — LIDOCAINE HCL 1 % IJ SOLN
INTRAMUSCULAR | Status: AC | PRN
  Administered 2018-11-05: 10 mL

## 2018-11-05 MED ORDER — VERAPAMIL HCL 2.5 MG/ML IV SOLN
INTRAVENOUS | Status: AC
  Filled 2018-11-05: qty 2

## 2018-11-05 MED ORDER — SODIUM CHLORIDE 0.9 % IV SOLN
INTRAVENOUS | Status: DC
  Administered 2018-11-05: 13:00:00 via INTRAVENOUS

## 2018-11-05 MED ORDER — SODIUM CHLORIDE 0.9 % IV SOLN
INTRAVENOUS | Status: AC | PRN
  Administered 2018-11-05: 10 mL/h via INTRAVENOUS

## 2018-11-05 MED ORDER — FENTANYL CITRATE (PF) 100 MCG/2ML IJ SOLN
INTRAMUSCULAR | Status: AC | PRN
  Administered 2018-11-05: 25 ug via INTRAVENOUS

## 2018-11-05 MED ORDER — ATORVASTATIN CALCIUM 10 MG PO TABS: 20.0000 mg | ORAL_TABLET | Freq: Every day | ORAL | Status: DC

## 2018-11-05 MED ORDER — VERAPAMIL HCL 2.5 MG/ML IV SOLN
INTRAVENOUS | Status: AC | PRN
  Administered 2018-11-05: 2.5 mg via INTRA_ARTERIAL

## 2018-11-05 MED ORDER — FENTANYL CITRATE (PF) 100 MCG/2ML IJ SOLN
INTRAMUSCULAR | Status: AC
  Filled 2018-11-05: qty 2

## 2018-11-05 MED ORDER — HEPARIN SODIUM (PORCINE) 1000 UNIT/ML IJ SOLN
INTRAMUSCULAR | Status: AC | PRN
  Administered 2018-11-05: 2000 [IU] via INTRA_ARTERIAL

## 2018-11-05 MED ORDER — NITROGLYCERIN 1 MG/10 ML FOR IR/CATH LAB
INTRA_ARTERIAL | Status: AC | PRN
  Administered 2018-11-05: 200 ug via INTRA_ARTERIAL

## 2018-11-05 MED ORDER — HEPARIN SODIUM (PORCINE) 1000 UNIT/ML IJ SOLN
INTRAMUSCULAR | Status: AC
  Filled 2018-11-05: qty 1

## 2018-11-05 MED ORDER — MIDAZOLAM HCL 2 MG/2ML IJ SOLN
INTRAMUSCULAR | Status: AC | PRN
  Administered 2018-11-05: 1 mg via INTRAVENOUS

## 2018-11-05 MED ORDER — ATORVASTATIN CALCIUM 10 MG PO TABS
20.0000 mg | ORAL_TABLET | Freq: Every day | ORAL | Status: DC
  Administered 2018-11-05: 20 mg via ORAL
  Filled 2018-11-05: qty 2

## 2018-11-05 MED ORDER — RIVAROXABAN 20 MG PO TABS
20.0000 mg | ORAL_TABLET | Freq: Every day | ORAL | Status: DC
  Administered 2018-11-05: 20 mg via ORAL
  Filled 2018-11-05 (×2): qty 1

## 2018-11-05 MED ORDER — MIDAZOLAM HCL 2 MG/2ML IJ SOLN
INTRAMUSCULAR | Status: AC
  Filled 2018-11-05: qty 2

## 2018-11-05 MED ORDER — LIDOCAINE HCL 1 % IJ SOLN
INTRAMUSCULAR | Status: AC
  Filled 2018-11-05: qty 20

## 2018-11-05 MED ORDER — IOHEXOL 300 MG/ML  SOLN
150.0000 mL | Freq: Once | INTRAMUSCULAR | Status: AC | PRN
  Administered 2018-11-05: 90 mL via INTRA_ARTERIAL

## 2018-11-05 MED ORDER — NITROGLYCERIN 1 MG/10 ML FOR IR/CATH LAB
INTRA_ARTERIAL | Status: AC
  Filled 2018-11-05: qty 10

## 2018-11-05 NOTE — Progress Notes (Signed)
Patient had an uneventful night. Frequently rounded on patient for safety.

## 2018-11-05 NOTE — Progress Notes (Signed)
RN attempted to deflate TR band by removing 3cc air but site noted to ooze blood; 3cc air replaced and oozing stopped with 13cc air remaining. Site remains level 0; will deflated per protocol and order in 30 mins. Pt voided x1 with RN assistance to BR. Delia Heady RN

## 2018-11-05 NOTE — Progress Notes (Signed)
Pt back to room from cath lab; per report pt has 13cc air in TR band and site remains level 0; site clean, dry and intact with no active bleeding noted. Right hand elevated on pillow; VSS: will continue to monitor and deflate per order and protocol. Francis Gaines Orelia Brandstetter RN   11/05/18 1320  Vitals  Temp 98 F (36.7 C)  Temp Source Oral  BP 137/72  MAP (mmHg) 93  BP Location Left Arm  BP Method Automatic  Patient Position (if appropriate) Lying  Pulse Rate (!) 56  Pulse Rate Source Dinamap  Resp 16  Oxygen Therapy  SpO2 100 %  O2 Device Room Air  MEWS Score  MEWS RR 0  MEWS Pulse 0  MEWS Systolic 0  MEWS LOC 0  MEWS Temp 0  MEWS Score 0  MEWS Score Color Green

## 2018-11-05 NOTE — Progress Notes (Signed)
Pt TR band deflated per order and band removed; new sterile dsg applied to site per order. VSS; right hand restriction and limitation education reinforced with pt. Will continue to closely monitor pt. Delia Heady RN

## 2018-11-05 NOTE — Progress Notes (Signed)
  Echocardiogram 2D Echocardiogram has been performed.  Kimberly Monroe 11/05/2018, 8:40 AM

## 2018-11-05 NOTE — Progress Notes (Signed)
OT Cancellation Note  Patient Details Name: Kimberly Monroe MRN: [id number] DOB: 1961/06/16   Cancelled Treatment:     PATIENT WAS OFF UNIT FOR TESTING.   Ionna Avis 11/05/2018, 1:17 PM

## 2018-11-05 NOTE — Progress Notes (Signed)
SLP Cancellation Note  Patient Details Name: Kimberly Monroe MRN: [id number] DOB: Jun 29, 1961   Cancelled treatment:       Reason Eval/Treat Not Completed: Patient at procedure or test/unavailable. Pt currently off unit for testing. Will continue efforts.  Nowell Sites B. Quentin [facility], CCC-SLP Speech Language Pathologist [phone]  Shonna Chock 11/05/2018, 11:52 AM

## 2018-11-05 NOTE — Progress Notes (Signed)
Pt discharge education and instructions completed with pt; pt voices understanding and denies any questions. Pt IV and telemetry removed; pt discharge home with family to transport her home. Pt transported off unit via wheelchair with belongings and family to the side. Delia Heady RN

## 2018-11-05 NOTE — Progress Notes (Signed)
PT Cancellation Note  Patient Details Name: Kimberly Monroe MRN: [id number] DOB: [birth year]   Cancelled Treatment:    Reason Eval/Treat Not Completed: Patient at procedure or test/unavailable made multiple attempts to work with patient, she was out of room/at procedure and unable to participate in PT. Plan to attempt to try to return if time/schedule allow.    Deniece Ree PT, DPT, CBIS  Supplemental Physical Therapist [facility]    Pager [phone] Acute Rehab Office [phone]

## 2018-11-05 NOTE — Progress Notes (Addendum)
STROKE TEAM PROGRESS NOTE   SUBJECTIVE (INTERVAL HISTORY) Her husband is at the bedside.  Overall her condition is completely resolved.  Patient stated that yesterday morning she looked at [facility] and found she had right facial droop and then she developed some right hand fine tremor and in ER examination showed right leg mild drift.  Now resolved.  Had cerebral angiogram this morning.  Husband admitted that patient has a lot of stress at work and at home.   OBJECTIVE Temp:  [97.8 F (36.6 C)-98.7 F (37.1 C)] 98 F (36.7 C) (01/31 1320) Pulse Rate:  [56-110] 63 (01/31 1330) Cardiac Rhythm: Normal sinus rhythm (01/31 1247) Resp:  [12-27] 18 (01/31 1330) BP: (102-166)/(63-117) 147/83 (01/31 1330) SpO2:  [92 %-100 %] 100 % (01/31 1330)  Recent Labs  Lab 11/04/18 0833  GLUCAP 116*   Recent Labs  Lab 11/04/18 0840 11/04/18 0841  NA  --  140  K  --  4.2  CL  --  103  CO2  --  28  GLUCOSE  --  121*  BUN  --  9  CREATININE 0.70 0.72  CALCIUM  --  9.3   Recent Labs  Lab 11/04/18 0841  AST 29  ALT 33  ALKPHOS 72  BILITOT 0.9  PROT 7.0  ALBUMIN 3.8   Recent Labs  Lab 11/04/18 0841  WBC 9.6  NEUTROABS 6.5  HGB 15.1*  HCT 45.1  MCV 91.3  PLT 270   No results for input(s): CKTOTAL, CKMB, CKMBINDEX, TROPONINI in the last 168 hours. Recent Labs    11/04/18 0841  LABPROT 19.0*  INR 1.62   Recent Labs    11/04/18 0831  COLORURINE STRAW*  LABSPEC 1.020  PHURINE 8.0  GLUCOSEU NEGATIVE  HGBUR NEGATIVE  BILIRUBINUR NEGATIVE  KETONESUR NEGATIVE  PROTEINUR NEGATIVE  NITRITE NEGATIVE  LEUKOCYTESUR NEGATIVE       Component Value Date/Time   CHOL 124 11/05/2018 0504   TRIG 91 11/05/2018 0504   HDL 45 11/05/2018 0504   CHOLHDL 2.8 11/05/2018 0504   VLDL 18 11/05/2018 0504   LDLCALC 61 11/05/2018 0504   Lab Results  Component Value Date   HGBA1C 6.0 (H) 11/05/2018      Component Value Date/Time   LABOPIA NONE DETECTED 11/04/2018 0831   COCAINSCRNUR NONE  DETECTED 11/04/2018 0831   LABBENZ NONE DETECTED 11/04/2018 0831   AMPHETMU NONE DETECTED 11/04/2018 0831   THCU NONE DETECTED 11/04/2018 0831   LABBARB NONE DETECTED 11/04/2018 0831    Recent Labs  Lab 11/04/18 0841  ETH <10    I have personally reviewed the radiological images below and agree with the radiology interpretations.  Ct Angio Head W Or Wo Contrast  Result Date: 11/04/2018 CLINICAL DATA:  Transient right facial droop. Decreased sensation on the right face. History of pulmonary embolism on Xarelto. EXAM: CT ANGIOGRAPHY HEAD AND NECK TECHNIQUE: Multidetector CT imaging of the head and neck was performed using the standard protocol during bolus administration of intravenous contrast. Multiplanar CT image reconstructions and MIPs were obtained to evaluate the vascular anatomy. Carotid stenosis measurements (when applicable) are obtained utilizing NASCET criteria, using the distal internal carotid diameter as the denominator. CONTRAST:  21mL ISOVUE-370 IOPAMIDOL (ISOVUE-370) INJECTION 76% COMPARISON:  Head MRA 12/29/2017 FINDINGS: CTA NECK FINDINGS Aortic arch: Standard 3 vessel aortic arch with widely patent arch vessel origins. Right carotid system: Patent without evidence of stenosis or dissection. Left carotid system: Patent without evidence of stenosis or dissection. Vertebral arteries: Patent  with the left being minimally larger than the right. No evidence of stenosis or dissection. Skeleton: Mild-to-moderate cervical disc and facet degeneration. Other neck: No evidence of acute abnormality or mass. Upper chest: Clear lung apices. Review of the MIP images confirms the above findings CTA HEAD FINDINGS Anterior circulation: The internal carotid arteries are patent from skull base to carotid termini with minimal atherosclerotic plaque not resulting in significant stenosis. ACAs and MCAs are patent without evidence of significant A1 or M1 stenosis. A severe stenosis of the proximal left M2  superior division is new. Diffuse distal branch vessel irregularity and attenuation is felt to be largely artifactual given normal appearance on the prior MRA. No aneurysm is identified. Posterior circulation: The intracranial vertebral arteries are patent to the basilar with mild right V4 stenosis noted. Patent left PICA and right AICA origins are identified. The basilar artery is widely patent. Both PCAs are patent with similar appearance of a severe proximal right P2 stenosis. No aneurysm is identified. Venous sinuses: Patent. Anatomic variants: None. Review of the MIP images confirms the above findings IMPRESSION: 1. No emergent large vessel occlusion. 2. Intracranial atherosclerosis with new severe proximal left M2 stenosis and unchanged severe proximal right P2 stenosis. 3. Widely patent cervical carotid and vertebral arteries. Electronically Signed   By: Logan Bores M.D.   On: 11/04/2018 09:26   Ct Angio Neck W Or Wo Contrast  Result Date: 11/04/2018 CLINICAL DATA:  Transient right facial droop. Decreased sensation on the right face. History of pulmonary embolism on Xarelto. EXAM: CT ANGIOGRAPHY HEAD AND NECK TECHNIQUE: Multidetector CT imaging of the head and neck was performed using the standard protocol during bolus administration of intravenous contrast. Multiplanar CT image reconstructions and MIPs were obtained to evaluate the vascular anatomy. Carotid stenosis measurements (when applicable) are obtained utilizing NASCET criteria, using the distal internal carotid diameter as the denominator. CONTRAST:  94mL ISOVUE-370 IOPAMIDOL (ISOVUE-370) INJECTION 76% COMPARISON:  Head MRA 12/29/2017 FINDINGS: CTA NECK FINDINGS Aortic arch: Standard 3 vessel aortic arch with widely patent arch vessel origins. Right carotid system: Patent without evidence of stenosis or dissection. Left carotid system: Patent without evidence of stenosis or dissection. Vertebral arteries: Patent with the left being minimally  larger than the right. No evidence of stenosis or dissection. Skeleton: Mild-to-moderate cervical disc and facet degeneration. Other neck: No evidence of acute abnormality or mass. Upper chest: Clear lung apices. Review of the MIP images confirms the above findings CTA HEAD FINDINGS Anterior circulation: The internal carotid arteries are patent from skull base to carotid termini with minimal atherosclerotic plaque not resulting in significant stenosis. ACAs and MCAs are patent without evidence of significant A1 or M1 stenosis. A severe stenosis of the proximal left M2 superior division is new. Diffuse distal branch vessel irregularity and attenuation is felt to be largely artifactual given normal appearance on the prior MRA. No aneurysm is identified. Posterior circulation: The intracranial vertebral arteries are patent to the basilar with mild right V4 stenosis noted. Patent left PICA and right AICA origins are identified. The basilar artery is widely patent. Both PCAs are patent with similar appearance of a severe proximal right P2 stenosis. No aneurysm is identified. Venous sinuses: Patent. Anatomic variants: None. Review of the MIP images confirms the above findings IMPRESSION: 1. No emergent large vessel occlusion. 2. Intracranial atherosclerosis with new severe proximal left M2 stenosis and unchanged severe proximal right P2 stenosis. 3. Widely patent cervical carotid and vertebral arteries. Electronically Signed   By: Zenia Resides  Jeralyn Ruths M.D.   On: 11/04/2018 09:26   Mr Brain Wo Contrast  Result Date: 11/04/2018 CLINICAL DATA:  Transient right facial droop. Decreased sensation on the right face. History of pulmonary embolism on Xarelto. EXAM: MRI HEAD WITHOUT CONTRAST TECHNIQUE: Multiplanar, multiecho pulse sequences of the brain and surrounding structures were obtained without intravenous contrast. COMPARISON:  Head CT 11/04/2018 and MRI 01/11/2018 and 01/14/2018 FINDINGS: Brain: There is no evidence of acute  infarct, intracranial hemorrhage, midline shift, or extra-axial fluid collection. The ventricles and sulci are within normal limits for age. A 1.1 cm focus of T2/FLAIR hyperintensity in the posterior aspect of the left superior frontal gyrus is unchanged from the prior MRI. This predominantly involves subcortical white matter though there may be some cortical involvement as well. The gyrus is subtly expanded. Smaller foci of T2 hyperintensity scattered elsewhere throughout the cerebral white matter bilaterally are unchanged and nonspecific but compatible with mild chronic small vessel ischemic disease. Vascular: Major intracranial vascular flow voids are preserved. Skull and upper cervical spine: Unremarkable bone marrow signal. Sinuses/Orbits: Unremarkable orbits. Trace left mastoid effusion. Clear paranasal sinuses. Other: None. IMPRESSION: 1. No acute intracranial abnormality. 2. Unchanged 1.1 cm T2 hyperintense lesion in the left superior frontal gyrus. This remains indeterminate, however stability from 01/2018 excludes infarct as an etiology. A low grade neoplasm remains a possibility, and continued follow-up is warranted. 3. Mild chronic small vessel ischemic disease. Electronically Signed   By: Logan Bores M.D.   On: 11/04/2018 10:21   Ct Head Code Stroke Wo Contrast  Result Date: 11/04/2018 CLINICAL DATA:  Code stroke. Focal neuro deficit with stroke suspected. A deficit is not provided nor noted in the chart at this time. EXAM: CT HEAD WITHOUT CONTRAST TECHNIQUE: Contiguous axial images were obtained from the base of the skull through the vertex without intravenous contrast. COMPARISON:  Brain MRI 01/11/2018 FINDINGS: Brain: No evidence of acute infarction, hemorrhage, hydrocephalus, extra-axial collection or mass lesion/mass effect. Mild white matter disease as seen previously. Left frontal low-density on prior MRI is not clearly seen by CT. Vascular: No hyperdense vessel or unexpected calcification.  Skull: Normal. Negative for fracture or focal lesion. Sinuses/Orbits: No acute finding. Other: These results were communicated to Dr. Rory Percy at 8:53 amon 1/30/2020by text page via the [facility] messaging system. ASPECTS [facility] Stroke Program Early CT Score) Not scored with this history. IMPRESSION: 1. No acute finding. 2. Left frontal lesion on April 2019 brain MRI is not evident on this study, recommend follow-up brain MRI. Electronically Signed   By: Monte Fantasia M.D.   On: 11/04/2018 08:54    PHYSICAL EXAM  Temp:  [97.8 F (36.6 C)-98.7 F (37.1 C)] 98 F (36.7 C) (01/31 1320) Pulse Rate:  [56-110] 63 (01/31 1330) Resp:  [12-27] 18 (01/31 1330) BP: (102-166)/(63-117) 147/83 (01/31 1330) SpO2:  [92 %-100 %] 100 % (01/31 1330)  General - Well nourished, well developed, in no apparent distress.  Ophthalmologic - fundi not visualized due to noncooperation.  Cardiovascular - Regular rate and rhythm.  Mental Status -  Level of arousal and orientation to time, place, and person were intact. Language including expression, naming, repetition, comprehension was assessed and found intact. Fund of Knowledge was assessed and was intact.  Cranial Nerves II - XII - II - Visual field intact OU. III, IV, VI - Extraocular movements intact. V - Facial sensation intact bilaterally. VII - Facial movement intact bilaterally. VIII - Hearing & vestibular intact bilaterally. X - Palate elevates symmetrically. XI -  Chin turning & shoulder shrug intact bilaterally. XII - Tongue protrusion intact.  Motor Strength - The patient's strength was normal in all extremities and pronator drift was absent except right foot DF and PF giveaway weakness.  Bulk was normal and fasciculations were absent.   Motor Tone - Muscle tone was assessed at the neck and appendages and was normal.  Reflexes - The patient's reflexes were symmetrical in all extremities and she had no pathological reflexes.  Sensory - Light  touch, temperature/pinprick were assessed and were symmetrical.    Coordination - The patient had normal movements in the hands and feet with no ataxia or dysmetria.  Tremor was absent.  Gait and Station - deferred.   ASSESSMENT/PLAN Ms. Kimberly Monroe is a 58 y.o. 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  No tPA given due to patient on Xarelto.    Anxiety vs. Conversion disorder, less likely stroke event - given inconsistent neuro exam, right hand fine tremor, giveaway weakness and stress at home and work  Resultant giveaway weakness at right foot  MRI  No acute infarct  CTA head and neck unchanged right P2 stenosis, questionable for new left M2 stenosis  Cerebral angiogram mild left M2 stenosis, PCAs patent  2D echo EF 60 to 65%  LDL 61  HgbA1c 6.0  Xarelto for VTE prophylaxis  Xarelto (rivaroxaban) daily prior to admission, now on Xarelto (rivaroxaban) daily.  Continue Xarelto home medication on discharge  Patient counseled to be compliant with her antithrombotic medications  Ongoing aggressive stroke risk factor management  Therapy recommendations: Pending  Disposition: Pending  Left frontal small mass lesion  MRI 01/2018 showed left frontal small mass lesion  Follow-up MRI 11/04/2018 showed unchanged small frontal mass lesion  Not likely infarct, questionable for low-grade glioma  No evidence of seizure at this time  May consider EEG as outpatient  Follow-up with neurology for MRI monitoring  History of bilateral PE  On Xarelto as home medication  Resume Xarelto  Continue Xarelto on discharge  Hypertension . Stable . Home BP including lisinopril  Long term BP goal normotensive  Hyperlipidemia  Home meds: Lipitor 20  LDL 61, goal < 70  Now on Lipitor 20  Continue statin at discharge  Other  Stroke Risk Factors  Obesity, Body mass index is 33.28 kg/m.   Migraines  Other Active Problems  Left ovarian cancer  Melanoma  Depression  Hospital day # 0  Neurology will sign off. Please call with questions. Pt will follow up with neurology at [facility] in about 4 weeks. Thanks for the consult.   Rosalin Hawking, MD PhD Stroke Neurology 11/05/2018 1:53 PM    To contact Stroke Continuity provider, please refer to [email or web address] After hours, contact General Neurology

## 2018-11-05 NOTE — Procedures (Signed)
S/P 4 vessel cerebral arteriogram RT radial approach. Findings. 1.Mild stenosis at origin of sup division of LT MCA  2.PCAs patent

## 2018-11-05 NOTE — Progress Notes (Signed)
Writer has attempted 4 times to call report and the phone rings over to the ED when not answered. Will give bedside report.

## 2018-11-07 NOTE — Discharge Summary (Signed)
Kimberly Monroe, is a 58 y.o. female  DOB Nov 18, 1960  MRN [id number].  Admission date:  11/04/2018  Admitting Physician  Karmen Bongo, MD  Discharge Date:  11/05/2018  Primary MD  Eulas Post, MD  Recommendations for primary care physician for things to follow:  -Follow-up stress/anxiety -Follow-up ensure kidney function  -Follow-up of left frontal mass  Discharge Diagnosis    Principal Problem:   TIA (transient ischemic attack) Active Problems:   Depression   History of pulmonary embolism   Essential hypertension      Past Medical History:  Diagnosis Date  . Allergy    seasonal  . Anemia   . Anxiety   . Basal cell cancer   . Depression   . History of blood clotting disorder   . Hx of cerebral artery stenosis   . Hypertension   . Melanoma ([place]) 2009   insitu  . Menorrhagia 02/10/2013  . Migraine    history of/none in years  . Ovarian cancer ([place]) 1995   left ovary  . Pulmonary embolism ([place])   . Squamous cell carcinoma   . Stroke [facility])     Past Surgical History:  Procedure Laterality Date  . ABDOMINAL HYSTERECTOMY  03/06/2013  . BREAST DUCTAL SYSTEM EXCISION  2009   L intraductal papilloma  . [place]   twins, singleton  . laparoscopy with ovarian cystectomy  1994   ovarian torsion  . 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 MOHS SURGERY  2009   Dr Link Snuffer       HPI  from the history and physical done on the day of admission:    Kimberly Monroe is a 58 y.o. female with medical history significant of ovarian CA and PE presenting with code stroke. She was feeling funny and off since yesterday.  She was having difficulty writing numbers on the board.  It was still coming on overnight.  Her smile was irregular this AM and she was gagging.  Her BP on arrival was super high.   She passed her swallow evaluation but feels like she is leaking some from her mouth.  She had some word finding difficulty and was very fatigued.  She was shaky especially on the right side.  Her gait was a bit off.   ED Course:  Stroke-like symptoms starting at 7AM - facial droop and RUE numbness and weakness.  On Xarelto - no tPA.  Symptoms rapidly improved.  MRI ordered to determine encephalopathy vs. Stroke.    Hospital Course:   1.  Stroke like symptoms: Resolved.  Patient presented with right-sided facial droop and right-sided weakness.  Husband reported patient being under a lot of stress at work and home.  MRI did not show any signs of an acute stroke.  CT angiogram of the head neck noted unchanged severe right P2 stenosis with questionable new left M2 stenosis.  Patient underwent four-vessel cerebral arteriogram on 1/31 noting mild left M2 stenosis and patent PCAs.  Creatinine at that time noted to be within normal limits at 0.72 and she had been placed on normal saline IV fluids.  Following the procedure patient had some minimal bleeding from the right wrist that resolved with the TR band.  Patient was not evaluated by PT/OT/speech as she was in the procedure.  Neurology suspects symptoms likely related with stress or conversion disorder, and less likely TIA/stroke.  2.  Left frontal lobe mass: MRI of the brain revealed a left frontal small mass lesion unchanged from  When previously seen by MRI in 01/2018.  Neurology question the possibility of a low-grade glioma.  There is no reports of seizure and it was recommended to consider EEG as an outpatient.  She is followed by [facility] neurology.  Patient was scheduled to have a follow-up appointment with [facility].  3.  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  4.  Hyperlipidemia: Stable.  Lipid panel revealed  LDL of 61 goal less than 70.  She was continued on current dose of Lipitor.  5.  Obesity: BMI 33.28 Follow UP  Follow-up Information    [facility]. Schedule an appointment as soon as possible for a visit in 4 week(s).   Specialty:  Neurology Contact information: [address] [place] [phone]           Consults obtained: Neurology  Discharge Condition: Stable  Diet and Activity recommendation: See Discharge Instructions below   Discharge Instructions    Ambulatory referral to Neurology   Complete by:  As directed    Follow up with any provider for left frontal brain mass. Thanks.   Discharge instructions   Complete by:  As directed    Follow with Primary MD Eulas Post, MD in 7 days. Please follow-up with [facility] Neurology they will call to set up an appointment.  Get CBC and BMP checked  by Primary MD or SNF MD in 5-7 days ( we routinely change or add medications that can affect your baseline labs and fluid status, therefore we recommend that you get the mentioned basic workup next visit with your PCP, your PCP may decide not to get them or add new tests based on their clinical decision)  Activity: As tolerated with fall precautions use walker/cane & assistance as needed   Special Instructions: If you have smoked or chewed Tobacco  in the last 2 yrs please stop smoking, stop any regular Alcohol  and or any Recreational drug use.  On your next visit with your primary care physician please Get Medicines reviewed and adjusted.  Please request your Eulas Post, MD to go over all Hospital Tests and Procedure/Radiological results at the follow up, please get all Hospital records sent to your Prim MD by signing hospital release before you go home.  If you experience worsening of your admission symptoms, develop shortness of breath, life threatening emergency, suicidal or homicidal thoughts you must seek  medical attention immediately by calling 911 or calling your MD immediately  if symptoms less severe.  You Must read complete instructions/literature along with all the possible adverse reactions/side effects for all the Medicines you take and that have been prescribed to you. Take any new Medicines after you have completely understood and accpet all the possible adverse reactions/side effects.   Do not drive, operate heavy machinery, perform activities at heights, swimming or participation  in water activities or provide baby sitting services if your were admitted for syncope or siezures until you have seen by Primary MD or a Neurologist and advised to do so again.  Do not drive when taking Pain medications.  Do not take more than prescribed Pain, Sleep and Anxiety Medications  Wear Seat belts while driving.   Please note  You were cared for by a hospitalist during your hospital stay. If you have any questions about your discharge medications or the care you received while you were in the hospital after you are discharged, you can call the unit and asked to speak with the hospitalist on call if the hospitalist that took care of you is not available. Once you are discharged, your primary care physician will handle any further medical issues. Please note that NO REFILLS for any discharge medications will be authorized once you are discharged, as it is imperative that you return to your primary care physician (or establish a relationship with a primary care physician if you do not have one) for your aftercare needs so that they can reassess your need for medications and monitor your lab values.   Face-to-face encounter (required for Medicare/Medicaid patients)   Complete by:  As directed    I Rondell A Tamala Julian certify that this patient is under my care and that I, or a nurse practitioner or physician's assistant working with me, had a face-to-face encounter that meets the physician face-to-face encounter  requirements with this patient on 11/05/2018. The encounter with the patient was in whole, or in part for the following medical condition(s) which is the primary reason for home health care (List medical condition): balance issues with patient being a fall risk   The encounter with the patient was in whole, or in part, for the following medical condition, which is the primary reason for home health care:  fall risk due to balance issues   I certify that, based on my findings, the following services are medically necessary home health services:  Physical therapy   Reason for Medically Necessary Home Health Services:  Therapy- Personnel officer, Public librarian   My clinical findings support the need for the above services:  Unsafe ambulation due to balance issues   Further, I certify that my clinical findings support that this patient is homebound due to:  Unable to leave home safely without assistance   Home Health   Complete by:  As directed    To provide the following care/treatments:  PT        Discharge Medications     Allergies as of 11/05/2018      Reactions   Lime Flavor [flavoring Agent] Anaphylaxis   Amoxicillin Other (See Comments)   Patient stated,"my mom told me to never take it. I don't even know what type of reaction."   Penicillins Other (See Comments)   Did it involve swelling of the face/tongue/throat, SOB, or low BP? Unknown Did it involve sudden or severe rash/hives, skin peeling, or any reaction on the inside of your mouth or nose? Unknown Did you need to seek medical attention at a hospital or doctor's office? Unknown When did it last happen? Childhood allergy If all above answers are "NO", may proceed with cephalosporin use. Patient stated,"I was told not to take it."      Medication List    TAKE these medications   atorvastatin 20 MG tablet Commonly known as:  LIPITOR Take 1 tablet (20 mg total) by mouth  daily. What changed:  when to  take this   lisinopril 10 MG tablet Commonly known as:  PRINIVIL,ZESTRIL TAKE 1 TABLET (10 MG TOTAL) BY MOUTH DAILY. What changed:  when to take this   rivaroxaban 20 MG Tabs tablet Commonly known as:  XARELTO Take 1 tablet (20 mg total) by mouth daily with supper. What changed:  when to take this   Vitamin D (Ergocalciferol) 1.25 MG (50000 UT) Caps capsule Commonly known as:  DRISDOL TAKE ONE CAPSULE BY MOUTH WEEKLY AS ONE DOSE What changed:  See the new instructions.       Major procedures and Radiology Reports - PLEASE review detailed and final reports for all details, in brief -      Ct Angio Head W Or Wo Contrast  Result Date: 11/04/2018 CLINICAL DATA:  Transient right facial droop. Decreased sensation on the right face. History of pulmonary embolism on Xarelto. EXAM: CT ANGIOGRAPHY HEAD AND NECK TECHNIQUE: Multidetector CT imaging of the head and neck was performed using the standard protocol during bolus administration of intravenous contrast. Multiplanar CT image reconstructions and MIPs were obtained to evaluate the vascular anatomy. Carotid stenosis measurements (when applicable) are obtained utilizing NASCET criteria, using the distal internal carotid diameter as the denominator. CONTRAST:  3mL ISOVUE-370 IOPAMIDOL (ISOVUE-370) INJECTION 76% COMPARISON:  Head MRA 12/29/2017 FINDINGS: CTA NECK FINDINGS Aortic arch: Standard 3 vessel aortic arch with widely patent arch vessel origins. Right carotid system: Patent without evidence of stenosis or dissection. Left carotid system: Patent without evidence of stenosis or dissection. Vertebral arteries: Patent with the left being minimally larger than the right. No evidence of stenosis or dissection. Skeleton: Mild-to-moderate cervical disc and facet degeneration. Other neck: No evidence of acute abnormality or mass. Upper chest: Clear lung apices. Review of the MIP images confirms the above findings CTA HEAD FINDINGS Anterior  circulation: The internal carotid arteries are patent from skull base to carotid termini with minimal atherosclerotic plaque not resulting in significant stenosis. ACAs and MCAs are patent without evidence of significant A1 or M1 stenosis. A severe stenosis of the proximal left M2 superior division is new. Diffuse distal branch vessel irregularity and attenuation is felt to be largely artifactual given normal appearance on the prior MRA. No aneurysm is identified. Posterior circulation: The intracranial vertebral arteries are patent to the basilar with mild right V4 stenosis noted. Patent left PICA and right AICA origins are identified. The basilar artery is widely patent. Both PCAs are patent with similar appearance of a severe proximal right P2 stenosis. No aneurysm is identified. Venous sinuses: Patent. Anatomic variants: None. Review of the MIP images confirms the above findings IMPRESSION: 1. No emergent large vessel occlusion. 2. Intracranial atherosclerosis with new severe proximal left M2 stenosis and unchanged severe proximal right P2 stenosis. 3. Widely patent cervical carotid and vertebral arteries. Electronically Signed   By: Logan Bores M.D.   On: 11/04/2018 09:26   Ct Angio Neck W Or Wo Contrast  Result Date: 11/04/2018 CLINICAL DATA:  Transient right facial droop. Decreased sensation on the right face. History of pulmonary embolism on Xarelto. EXAM: CT ANGIOGRAPHY HEAD AND NECK TECHNIQUE: Multidetector CT imaging of the head and neck was performed using the standard protocol during bolus administration of intravenous contrast. Multiplanar CT image reconstructions and MIPs were obtained to evaluate the vascular anatomy. Carotid stenosis measurements (when applicable) are obtained utilizing NASCET criteria, using the distal internal carotid diameter as the denominator. CONTRAST:  77mL ISOVUE-370 IOPAMIDOL (ISOVUE-370) INJECTION 76%  COMPARISON:  Head MRA 12/29/2017 FINDINGS: CTA NECK FINDINGS Aortic  arch: Standard 3 vessel aortic arch with widely patent arch vessel origins. Right carotid system: Patent without evidence of stenosis or dissection. Left carotid system: Patent without evidence of stenosis or dissection. Vertebral arteries: Patent with the left being minimally larger than the right. No evidence of stenosis or dissection. Skeleton: Mild-to-moderate cervical disc and facet degeneration. Other neck: No evidence of acute abnormality or mass. Upper chest: Clear lung apices. Review of the MIP images confirms the above findings CTA HEAD FINDINGS Anterior circulation: The internal carotid arteries are patent from skull base to carotid termini with minimal atherosclerotic plaque not resulting in significant stenosis. ACAs and MCAs are patent without evidence of significant A1 or M1 stenosis. A severe stenosis of the proximal left M2 superior division is new. Diffuse distal branch vessel irregularity and attenuation is felt to be largely artifactual given normal appearance on the prior MRA. No aneurysm is identified. Posterior circulation: The intracranial vertebral arteries are patent to the basilar with mild right V4 stenosis noted. Patent left PICA and right AICA origins are identified. The basilar artery is widely patent. Both PCAs are patent with similar appearance of a severe proximal right P2 stenosis. No aneurysm is identified. Venous sinuses: Patent. Anatomic variants: None. Review of the MIP images confirms the above findings IMPRESSION: 1. No emergent large vessel occlusion. 2. Intracranial atherosclerosis with new severe proximal left M2 stenosis and unchanged severe proximal right P2 stenosis. 3. Widely patent cervical carotid and vertebral arteries. Electronically Signed   By: Logan Bores M.D.   On: 11/04/2018 09:26   Mr Brain Wo Contrast  Result Date: 11/04/2018 CLINICAL DATA:  Transient right facial droop. Decreased sensation on the right face. History of pulmonary embolism on Xarelto.  EXAM: MRI HEAD WITHOUT CONTRAST TECHNIQUE: Multiplanar, multiecho pulse sequences of the brain and surrounding structures were obtained without intravenous contrast. COMPARISON:  Head CT 11/04/2018 and MRI 01/11/2018 and 01/14/2018 FINDINGS: Brain: There is no evidence of acute infarct, intracranial hemorrhage, midline shift, or extra-axial fluid collection. The ventricles and sulci are within normal limits for age. A 1.1 cm focus of T2/FLAIR hyperintensity in the posterior aspect of the left superior frontal gyrus is unchanged from the prior MRI. This predominantly involves subcortical white matter though there may be some cortical involvement as well. The gyrus is subtly expanded. Smaller foci of T2 hyperintensity scattered elsewhere throughout the cerebral white matter bilaterally are unchanged and nonspecific but compatible with mild chronic small vessel ischemic disease. Vascular: Major intracranial vascular flow voids are preserved. Skull and upper cervical spine: Unremarkable bone marrow signal. Sinuses/Orbits: Unremarkable orbits. Trace left mastoid effusion. Clear paranasal sinuses. Other: None. IMPRESSION: 1. No acute intracranial abnormality. 2. Unchanged 1.1 cm T2 hyperintense lesion in the left superior frontal gyrus. This remains indeterminate, however stability from 01/2018 excludes infarct as an etiology. A low grade neoplasm remains a possibility, and continued follow-up is warranted. 3. Mild chronic small vessel ischemic disease. Electronically Signed   By: Logan Bores M.D.   On: 11/04/2018 10:21   Ct Head Code Stroke Wo Contrast  Result Date: 11/04/2018 CLINICAL DATA:  Code stroke. Focal neuro deficit with stroke suspected. A deficit is not provided nor noted in the chart at this time. EXAM: CT HEAD WITHOUT CONTRAST TECHNIQUE: Contiguous axial images were obtained from the base of the skull through the vertex without intravenous contrast. COMPARISON:  Brain MRI 01/11/2018 FINDINGS: Brain: No  evidence of acute infarction, hemorrhage,  hydrocephalus, extra-axial collection or mass lesion/mass effect. Mild white matter disease as seen previously. Left frontal low-density on prior MRI is not clearly seen by CT. Vascular: No hyperdense vessel or unexpected calcification. Skull: Normal. Negative for fracture or focal lesion. Sinuses/Orbits: No acute finding. Other: These results were communicated to Dr. Rory Percy at 8:53 amon 1/30/2020by text page via the [facility] messaging system. ASPECTS [facility] Stroke Program Early CT Score) Not scored with this history. IMPRESSION: 1. No acute finding. 2. Left frontal lesion on April 2019 brain MRI is not evident on this study, recommend follow-up brain MRI. Electronically Signed   By: Monte Fantasia M.D.   On: 11/04/2018 08:54    Micro Results    No results found for this or any previous visit (from the past 240 hour(s)).     Today   Subjective    Kimberly Monroe today states that symptoms have resolved.    Objective   Blood pressure 128/65, pulse 78, temperature 98.6 F (37 C), temperature source Oral, resp. rate 18, height 5\' 5"  (1.651 m), weight 90.7 kg, last menstrual period 01/24/2013, SpO2 96 %.    Exam  Constitutional: Obese female who appears to be in NAD and able to follow commands Eyes: PERRL, lids and conjunctivae normal ENMT: Mucous membranes are moist. Posterior pharynx clear of any exudate or lesions.   Neck: normal, supple, no masses, no thyromegaly Respiratory: clear to auscultation bilaterally, no wheezing, no crackles. Normal respiratory effort. No accessory muscle use.  Cardiovascular: Regular rate and rhythm, no murmurs / rubs / gallops. No extremity edema. 2+ pedal pulses. No carotid bruits.  Abdomen: no tenderness, no masses palpated. No hepatosplenomegaly. Bowel sounds positive.  Musculoskeletal: no clubbing / cyanosis. No joint deformity upper and lower extremities. Good ROM, no contractures. Normal muscle tone.  Skin:  no rashes, lesions, ulcers. No induration Neurologic: CN 2-12 grossly intact.  Strength 5/5 in all 4.  Psychiatric: Normal judgment and insight. Alert and oriented x 3.  Anxious mood.    Data Review   CBC w Diff:  Lab Results  Component Value Date   WBC 9.6 11/04/2018   HGB 15.1 (H) 11/04/2018   HGB 14.2 10/08/2018   HGB 15.2 07/23/2016   HCT 45.1 11/04/2018   HCT 44.5 07/23/2016   PLT 270 11/04/2018   PLT 284 10/08/2018   PLT 277 07/23/2016   LYMPHOPCT 21 11/04/2018   LYMPHOPCT 31.5 07/23/2016   MONOPCT 8 11/04/2018   MONOPCT 5.9 07/23/2016   EOSPCT 2 11/04/2018   EOSPCT 2.2 07/23/2016   BASOPCT 1 11/04/2018   BASOPCT 0.4 07/23/2016    CMP:  Lab Results  Component Value Date   NA 140 11/04/2018   NA 141 07/23/2016   K 4.2 11/04/2018   K 4.2 07/23/2016   CL 103 11/04/2018   CO2 28 11/04/2018   CO2 25 07/23/2016   BUN 9 11/04/2018   BUN 14.6 07/23/2016   CREATININE 0.72 11/04/2018   CREATININE 0.88 10/08/2018   CREATININE 0.7 07/23/2016   PROT 7.0 11/04/2018   PROT 7.1 07/23/2016   ALBUMIN 3.8 11/04/2018   ALBUMIN 3.5 07/23/2016   BILITOT 0.9 11/04/2018   BILITOT 0.4 10/08/2018   BILITOT 0.30 07/23/2016   ALKPHOS 72 11/04/2018   ALKPHOS 97 07/23/2016   AST 29 11/04/2018   AST 20 10/08/2018   AST 25 07/23/2016   ALT 33 11/04/2018   ALT 39 10/08/2018   ALT 27 07/23/2016  .   Total Time in preparing paper  work, Microbiologist and todays exam - 79 minutes  Norval Morton M.D on 11/07/2018 at 10:54 AM  [facility]   Office  [phone]

## 2018-11-08 ENCOUNTER — Telehealth: Payer: Self-pay

## 2018-11-08 ENCOUNTER — Encounter (HOSPITAL_COMMUNITY): Payer: Self-pay | Admitting: Interventional Radiology

## 2018-11-08 ENCOUNTER — Other Ambulatory Visit: Payer: Self-pay

## 2018-11-08 ENCOUNTER — Ambulatory Visit: Payer: BLUE CROSS/BLUE SHIELD | Admitting: Family Medicine

## 2018-11-08 VITALS — BP 126/78 | HR 80 | Temp 98.3°F | Ht 64.0 in | Wt 206.6 lb

## 2018-11-08 DIAGNOSIS — I10 Essential (primary) hypertension: Secondary | ICD-10-CM

## 2018-11-08 DIAGNOSIS — G459 Transient cerebral ischemic attack, unspecified: Secondary | ICD-10-CM | POA: Diagnosis not present

## 2018-11-08 DIAGNOSIS — R7989 Other specified abnormal findings of blood chemistry: Secondary | ICD-10-CM | POA: Diagnosis not present

## 2018-11-08 NOTE — Telephone Encounter (Signed)
Pt called again and per PEC agent is very anxious about her medications.  I reviewed DC summary and it states to discontinue any medications NOT on the list - Lisinopril IS on the list. Based on this pt has been advised to take her medication today and all meds and dosings would be reviewed at OV this afternoon.  Megan - FYI no need to contact pt, she is scheduled for OV this afternoon.

## 2018-11-08 NOTE — Progress Notes (Signed)
Subjective:     Patient ID: Kimberly Monroe, female   DOB: Feb 08, 1961, 58 y.o.   MRN: [id number]  HPI  Patient is seen for hospital follow-up.  She has history of hypertension, past history of CVA, history of bilateral pulmonary emboli, remote history of melanoma, and history of depression.  She had apparently noted difficulty writing numbers on a board the day before admission and on the morning of admission she noted that she had some right facial droop and questionable difficulty swallowing.  Possible difficulty with word finding.  She felt very fatigued and somewhat "shaky "on the right side.  Her gait was off a bit.  She has a friend accompanying her today and she noticed some right facial droop along with altered gait and she complained of some right upper extremity numbness and weakness.  Symptoms rapidly improved after she got to the hospital.  Initial concern was for stroke versus TIA.  MRI did not show any acute stroke.  CT angiogram head and neck basically unchanged from prior studies.  She underwent four-vessel cerebral arteriogram noting mild left M2 stenosis and patent PCAs.  She was seen by speech therapy and bedside evaluation revealed no concerns for aspiration.  She was seen in consultation by neurology.  Recommendation was for outpatient follow-up.  MRI did show small left frontal mass unchanged from prior MRI 01/2018.  No history of seizure.  Patient is followed by [facility] neurology.  She has history of bilateral pulmonary emboli is on chronic Xarelto.  She takes Lipitor for hyperlipidemia with LDL of 61.  She had hemoglobin A1c 6.0%.  Patient had extremely high blood pressure this morning.  She is under increased job stress but states she had systolic reading 408.  Her lisinopril been held following her arteriogram.  She is hydrating well.  She started herself back on lisinopril this morning.  Blood pressure is much improved at this time.  She states her neurologic symptoms of facial  droop, difficulty word finding, right-sided weakness have all 100% resolved.  She does complain of some increased thirst.  No polyuria.  She had mildly elevated TSH over 5 with free T4 during hospitalization.  Past Medical History:  Diagnosis Date  . Allergy    seasonal  . Anemia   . Anxiety   . Basal cell cancer   . Depression   . History of blood clotting disorder   . Hx of cerebral artery stenosis   . Hypertension   . Melanoma ([place]) 2009   insitu  . Menorrhagia 02/10/2013  . Migraine    history of/none in years  . Ovarian cancer ([place]) 1995   left ovary  . Pulmonary embolism ([place])   . Squamous cell carcinoma   . Stroke [facility])    Past Surgical History:  Procedure Laterality Date  . ABDOMINAL HYSTERECTOMY  03/06/2013  . BREAST DUCTAL SYSTEM EXCISION  2009   L intraductal papilloma  . CEREBRAL ANGIOGRAM  2020  . [place]   twins, singleton  . IR ANGIO INTRA EXTRACRAN SEL COM CAROTID INNOMINATE BILAT MOD SED  11/05/2018  . IR ANGIO VERTEBRAL SEL SUBCLAVIAN INNOMINATE UNI L MOD SED  11/05/2018  . IR ANGIO VERTEBRAL SEL VERTEBRAL UNI R MOD SED  11/05/2018  . IR US GUIDE VASC ACCESS RIGHT  11/05/2018  . laparoscopy with ovarian cystectomy  1994   ovarian torsion  . 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 MOHS SURGERY  2009   Dr  Link Snuffer    reports that she has never smoked. She has never used smokeless tobacco. She reports current alcohol use. She reports that she does not use drugs. family history includes Breast cancer (age of onset: 79) in her mother; Cancer in her paternal uncle; Cancer (age of onset: 15) in her father; Colon cancer in her father; Stroke (age of onset: 34) in her sister. Allergies  Allergen Reactions  . Lime Flavor [Flavoring Agent] Anaphylaxis  . Amoxicillin Other (See Comments)    Patient stated,"my mom told me to never take it. I don't even know what type of reaction."  . Penicillins Other (See Comments)    Did it involve swelling of the  face/tongue/throat, SOB, or low BP? Unknown Did it involve sudden or severe rash/hives, skin peeling, or any reaction on the inside of your mouth or nose? Unknown Did you need to seek medical attention at a hospital or doctor's office? Unknown When did it last happen? Childhood allergy If all above answers are "NO", may proceed with cephalosporin use.   Patient stated,"I was told not to take it."     Review of Systems  Constitutional: Negative for unexpected weight change.  HENT: Negative for trouble swallowing.   Eyes: Negative for visual disturbance.  Respiratory: Negative for cough, chest tightness, shortness of breath and wheezing.   Cardiovascular: Negative for chest pain, palpitations and leg swelling.  Gastrointestinal: Negative for abdominal pain.  Genitourinary: Negative for dysuria.  Neurological: Negative for dizziness, seizures, syncope, weakness, light-headedness and headaches.  Psychiatric/Behavioral: Negative for confusion.       Objective:   Physical Exam Constitutional:      Appearance: She is well-developed.  Eyes:     Pupils: Pupils are equal, round, and reactive to light.  Neck:     Musculoskeletal: Neck supple.     Thyroid: No thyromegaly.     Vascular: No JVD.  Cardiovascular:     Rate and Rhythm: Normal rate and regular rhythm.     Heart sounds: No gallop.   Pulmonary:     Effort: Pulmonary effort is normal. No respiratory distress.     Breath sounds: Normal breath sounds. No wheezing or rales.  Musculoskeletal:     Right lower leg: No edema.     Left lower leg: No edema.  Neurological:     General: No focal deficit present.     Mental Status: She is alert and oriented to person, place, and time.     Cranial Nerves: No cranial nerve deficit.     Motor: No weakness.     Coordination: Coordination normal.  Psychiatric:        Mood and Affect: Mood normal.        Behavior: Behavior normal.        Assessment:     #1 recent reported  transient facial droop and question of right-sided weakness.  Question was TIA.  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  Pending neurology follow-up  #3 hypertension with severely elevated reading this morning but much improved today after starting back lisinopril.  #4 history of bilateral pulmonary emboli on chronic Xarelto  #5 hyperlipidemia treated with Lipitor with recent LDL of 61  #6 probable prediabetes range blood sugars with A1c 6.0%  #7 mildly elevated TSH with normal free T4.  Questionable significance    Plan:     -Recheck basic metabolic panel -Continue current medications -Continue to  monitor blood pressure closely and be in touch of consistent systolic readings over 067. -Recommend follow-up within 3 months and recheck A1c and TSH then -Patient has pending follow-up with neurology already scheduled  Eulas Post MD [facility] at [facility]

## 2018-11-08 NOTE — Telephone Encounter (Signed)
Copied from [place] [id number]. Topic: Referral - Status >> Nov 08, 2018  8:06 AM Scherrie Gerlach wrote: Reason for CRM: pt was dc'd 7 pm Friday night.  According to her paperwork, she states she is homebound, and that home health PT should be coming in to see her. But there are no orders. Pt is alone and should not drive or leave the house without assistance. Pt needs a call ASAP to help figure out the next step.  Also AVS tells her not to take her bp med. When pt went to the ED her bp was very elevated. Pt needs to know asap if she should take her bp med. I have made pt appt for this afternoon. 4:20 pm.( If pt needs to be seen this soon)

## 2018-11-08 NOTE — Patient Instructions (Signed)
Transient Ischemic Attack  A transient ischemic attack (TIA) is a "warning stroke" that causes stroke-like symptoms that then go away quickly. The symptoms of a TIA come on suddenly, and they last less than 24 hours. Unlike a stroke, a TIA does not cause permanent damage to the brain. It is important to know the symptoms of a TIA and what to do. Seek medical care right away, even if your symptoms go away.  Having a TIA is a sign that you are at higher risk for a permanent stroke. Lifestyle changes and medical treatments can help prevent a stroke.  What are the causes?  This condition is caused by a temporary blockage in an artery in the head or neck. The blockage does not allow the brain to get the blood supply it needs and can cause various symptoms. The blockage can be caused by:   Fatty buildup in an artery in the head or neck (atherosclerosis).   A blood clot.   Tearing of an artery (dissection).   Inflammation of an artery (vasculitis).  Sometimes the cause is not known.  What increases the risk?  Certain factors may make you more likely to develop this condition. Some of these factors are things that you can change, such as:   Obesity.   Using products that contain nicotine or tobacco, such as cigarettes and e-cigarettes.   Taking oral birth control, especially if you also use tobacco.   Lack of physical inactivity.   Excessive use of alcohol.   Use of drugs, especially cocaine and methamphetamine.  Other risk factors include:   High blood pressure (hypertension).   High cholesterol.   Diabetes mellitus.   Heart disease (coronary artery disease).   Atrial fibrillation.   Being African American or Hispanic.   Being over the age of 60.   Being female.   Family history of stroke.   Previous history of blood clots, stroke, TIA, or heart attack.   Sickle cell disease.   Being a woman with a history of preeclampsia.   Migraine headache.   Sleep apnea.   Chronic inflammatory diseases, such as  rheumatoid arthritis or lupus.   Blood clotting disorders (hypercoagulable state).  What are the signs or symptoms?  Symptoms of this condition are the same as those of a stroke, but they are temporary. The symptoms develop suddenly, and they go away quickly, usually within minutes to hours. Symptoms may include sudden:   Weakness or numbness in your face, arm, or leg, especially on one side of your body.   Trouble walking or difficulty moving your arms or legs.   Trouble speaking, understanding speech, or both (aphasia).   Vision changes in one or both eyes. These include double vision, blurred vision, or loss of vision.   Dizziness.   Confusion.   Loss of balance or coordination.   Nausea and vomiting.   Severe headache with no known cause.  If possible, make note of the exact time that you last felt like your normal self and what time your symptoms started. Tell your health care provider.  How is this diagnosed?  This condition may be diagnosed based on:   Your symptoms and medical history.   A physical exam.   Imaging tests, usually a CT or MRI scan of the brain.   Blood tests.  You may also have other tests, including:   Electrocardiogram (ECG).   Echocardiogram.   Carotid ultrasound.   A scan of the brain circulation (CT angiogram   or MRI angiogram).   Continuous heart monitoring.  How is this treated?  The goal of treatment is to reduce the risk for a subsequent stroke. Treatment may include stroke prevention therapies such as:   Changes to diet or lifestyle to decrease your risk. Lifestyle changes may include exercising and stopping smoking.   Medicines to thin the blood (antiplatelets or anticoagulants).   Blood pressure medicines.   Medicines to reduce cholesterol.   Treating other health conditions, such as diabetes or atrial fibrillation.  If testing shows that you have narrowing in the arteries to your brain, your health care provider may recommend a procedure, such as:   Carotid  endarterectomy. This is a surgery to remove the blockage from your artery.   Carotid angioplasty and stenting. This is a procedure to open or widen an artery in the neck using a metal mesh tube (stent). The stent helps keep the artery open by supporting the artery walls.  Follow these instructions at home:  Medicines     Take over-the-counter and prescription medicines only as told by your health care provider.   If you were told to take a medicine to thin your blood, such as aspirin or an anticoagulant, take it exactly as told by your health care provider.  ? Taking too much blood-thinning medicine can cause bleeding.  ? If you do not take enough blood-thinning medicine, you will not have the protection that you need against a stroke and other problems.  Eating and drinking     Eat 5 or more servings of fruits and vegetables each day.   Follow instructions from your health care provider about diet. You may need to follow a certain nutrition plan to help manage risk factors for stroke, such as high blood pressure, high cholesterol, diabetes, or obesity. This may include:  ? Eating a low-fat, low-salt diet.  ? Including a lot of fiber in your diet.  ? Limiting the amount of carbohydrates and sugar in your diet.   Limit alcohol intake to no more than 1 drink a day for nonpregnant women and 2 drinks a day for men. One drink equals 12 oz of beer, 5 oz of wine, or 1 oz of hard liquor.  General instructions   Maintain a healthy weight.   Stay physically active. Try to get at least 30 minutes of exercise on most or all days.   Find out if you have sleep apnea, and seek treatment if needed.   Do not use any products that contain nicotine or tobacco, such as cigarettes and e-cigarettes. If you need help quitting, ask your health care provider.   Do not abuse drugs.   Keep all follow-up visits as told by your health care provider. This is important.  Where to find more information   American Stroke Association:  www.strokeassociation.org   National Stroke Association: www.stroke.org  Get help right away if:   You have chest pain or an irregular heartbeat.   You have any symptoms of stroke. The acronym BEFAST is an easy way to remember the main warning signs of stroke.  ? B - Balance problems. Signs include dizziness, sudden trouble walking, or loss of balance.  ? E - Eye problems. This includes trouble seeing or a sudden change in vision.  ? F - Face changes. This includes sudden weakness or numbness of the face, or the face or eyelid drooping to one side.  ? A - Arm weakness or numbness. This happens suddenly and usually   on one side of the body.  ? S - Speech problems. This includes trouble speaking or trouble understanding speech.  ? T - Time. Time to call 911 or seek emergency care. Do not wait to see if symptoms will go away. Make note of the time your symptoms started.   Other signs of stroke may include:  ? A sudden, severe headache with no known cause.  ? Nausea or vomiting.  ? Seizure.  These symptoms may represent a serious problem that is an emergency. Do not wait to see if the symptoms will go away. Get medical help right away. Call your local emergency services (911 in the [place] Do not drive yourself to the hospital.  Summary   A TIA happens when an artery in the head or neck is blocked, leading to stroke-like symptoms that then go away quickly. The blockage clears before there is any permanent brain damage. A TIA is a medical emergency and requires immediate medical attention.   Symptoms of this condition are the same as those of a stroke, but they are temporary. The symptoms usually develop suddenly, and they go away quickly, usually within minutes to hours.   Having a TIA means that you are at high risk of a stroke in the near future.   Treatment may include medicines to thin the blood as well as medicines, diet changes, and lifestyle changes to manage conditions that increase the risk of another TIA  or a stroke.  This information is not intended to replace advice given to you by your health care provider. Make sure you discuss any questions you have with your health care provider.  Document Released: 07/02/2005 Document Revised: 03/30/2018 Document Reviewed: 11/04/2016  Elsevier Interactive Patient Education  2019 Elsevier Inc.

## 2018-11-08 NOTE — Telephone Encounter (Signed)
Please see messages.

## 2018-11-09 NOTE — Addendum Note (Signed)
Addended by: Elmer Picker on: 11/09/2018 04:17 PM   Modules accepted: Orders

## 2018-11-10 ENCOUNTER — Encounter: Payer: Self-pay | Admitting: Family Medicine

## 2018-11-10 LAB — BASIC METABOLIC PANEL
BUN: 19 mg/dL (ref 6–23)
CO2: 24 meq/L (ref 19–32)
Calcium: 9.5 mg/dL (ref 8.4–10.5)
Chloride: 105 mEq/L (ref 96–112)
Creatinine, Ser: 0.74 mg/dL (ref 0.40–1.20)
GFR: 80.75 mL/min (ref >60.00)
GLUCOSE: 83 mg/dL (ref 70–99)
Potassium: 4.3 mEq/L (ref 3.5–5.1)
Sodium: 140 mEq/L (ref 135–145)

## 2018-11-12 ENCOUNTER — Other Ambulatory Visit: Payer: Self-pay

## 2018-11-12 ENCOUNTER — Encounter: Payer: Self-pay | Admitting: Family Medicine

## 2018-11-12 ENCOUNTER — Ambulatory Visit: Payer: BLUE CROSS/BLUE SHIELD | Admitting: Family Medicine

## 2018-11-12 VITALS — BP 138/88 | HR 75 | Temp 97.7°F | Ht 64.0 in | Wt 205.1 lb

## 2018-11-12 DIAGNOSIS — I10 Essential (primary) hypertension: Secondary | ICD-10-CM

## 2018-11-12 DIAGNOSIS — I639 Cerebral infarction, unspecified: Secondary | ICD-10-CM

## 2018-11-12 DIAGNOSIS — R4189 Other symptoms and signs involving cognitive functions and awareness: Secondary | ICD-10-CM | POA: Diagnosis not present

## 2018-11-12 MED ORDER — AMLODIPINE BESYLATE 2.5 MG PO TABS: 2.5000 mg | ORAL_TABLET | Freq: Every day | ORAL | 3 refills | Status: DC

## 2018-11-12 NOTE — Patient Instructions (Signed)
We will set up neuropsychological assessment.  We are setting up FMLA paperwork.

## 2018-11-12 NOTE — Progress Notes (Signed)
Subjective:     Patient ID: Kimberly Monroe, female   DOB: [birth year], 58 y.o.   MRN: [id number]  HPI Patient is here to discuss some recent cognitive changes.  She is accompanied by her daughter.  Refer to note from 11/08/2018 for details of her recent hospitalization:  Patient is seen for hospital follow-up.  She has history of hypertension, past history of CVA, history of bilateral pulmonary emboli, remote history of melanoma, and history of depression.  She had apparently noted difficulty writing numbers on a board the day before admission and on the morning of admission she noted that she had some right facial droop and questionable difficulty swallowing.  Possible difficulty with word finding.  She felt very fatigued and somewhat "shaky "on the right side.  Her gait was off a bit.  She has a friend accompanying her today and she noticed some right facial droop along with altered gait and she complained of some right upper extremity numbness and weakness.  Symptoms rapidly improved after she got to the hospital.  Initial concern was for stroke versus TIA.  MRI did not show any acute stroke.  CT angiogram head and neck basically unchanged from prior studies.  She underwent four-vessel cerebral arteriogram noting mild left M2 stenosis and patent PCAs.  She was seen by speech therapy and bedside evaluation revealed no concerns for aspiration.  She was seen in consultation by neurology.  Recommendation was for outpatient follow-up.  MRI did show small left frontal mass unchanged from prior MRI 01/2018.  No history of seizure.  Patient is followed by [facility] neurology.  She has history of bilateral pulmonary emboli is on chronic Xarelto.  She takes Lipitor for hyperlipidemia with LDL of 61.  She had hemoglobin A1c 6.0%.  Patient had extremely high blood pressure this morning.  She is under increased job stress but states she had systolic reading 488.  Her lisinopril been held following her arteriogram.   She is hydrating well.  She started herself back on lisinopril this morning.  Blood pressure is much improved at this time.  She states her neurologic symptoms of facial droop, difficulty word finding, right-sided weakness have all 100% resolved.  She does complain of some increased thirst.  No polyuria.  She had mildly elevated TSH over 5 with free T4 during hospitalization.  She is currently in process of trying to get back into see her neurologist at [facility].  She also is trying to get into see a neurologist at [facility] neurologic.  She states that she is having difficulty with processing.  For example, she states over the past several days she has had issues where she was reading a sign and cannot process what this I was saying.  Last night she was standing at her dishwasher and seemed to have difficulty knowing what to do next.  She is very concerned about her inability to work at this point.  She has incredible stress levels.  Also relates her blood pressure last night shot up to 220/100.  She felt flushed this time.  Recent blood pressure in office a few days ago was 126/78.  She is up somewhat today.  She takes lisinopril 10 mg daily.  Her other medications include Lipitor and Xarelto.  Recent MRI did not show any evidence for recent acute stroke  She does have history of depression but feels more anxious at this point then depressed  Past Medical History:  Diagnosis Date  . Allergy  seasonal  . Anemia   . Anxiety   . Basal cell cancer   . Depression   . History of blood clotting disorder   . Hx of cerebral artery stenosis   . Hypertension   . Melanoma ([place]) 2009   insitu  . Menorrhagia 02/10/2013  . Migraine    history of/none in years  . Ovarian cancer ([place]) 1995   left ovary  . Pulmonary embolism ([place])   . Squamous cell carcinoma   . Stroke [facility])    Past Surgical History:  Procedure Laterality Date  . ABDOMINAL HYSTERECTOMY  03/06/2013  . BREAST DUCTAL SYSTEM  EXCISION  2009   L intraductal papilloma  . CEREBRAL ANGIOGRAM  2020  . [place]   twins, singleton  . IR ANGIO INTRA EXTRACRAN SEL COM CAROTID INNOMINATE BILAT MOD SED  11/05/2018  . IR ANGIO VERTEBRAL SEL SUBCLAVIAN INNOMINATE UNI L MOD SED  11/05/2018  . IR ANGIO VERTEBRAL SEL VERTEBRAL UNI R MOD SED  11/05/2018  . IR US GUIDE VASC ACCESS RIGHT  11/05/2018  . laparoscopy with ovarian cystectomy  1994   ovarian torsion  . 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 MOHS SURGERY  2009   Dr Link Snuffer    reports that she has never smoked. She has never used smokeless tobacco. She reports current alcohol use. She reports that she does not use drugs. family history includes Breast cancer (age of onset: 78) in her mother; Cancer in her paternal uncle; Cancer (age of onset: 4) in her father; Colon cancer in her father; Stroke (age of onset: 84) in her sister. Allergies  Allergen Reactions  . Lime Flavor [Flavoring Agent] Anaphylaxis  . Amoxicillin Other (See Comments)    Patient stated,"my mom told me to never take it. I don't even know what type of reaction."  . Penicillins Other (See Comments)    Did it involve swelling of the face/tongue/throat, SOB, or low BP? Unknown Did it involve sudden or severe rash/hives, skin peeling, or any reaction on the inside of your mouth or nose? Unknown Did you need to seek medical attention at a hospital or doctor's office? Unknown When did it last happen? Childhood allergy If all above answers are "NO", may proceed with cephalosporin use.   Patient stated,"I was told not to take it."        Review of Systems  Constitutional: Positive for fatigue.  Respiratory: Negative for cough.   Cardiovascular: Negative for chest pain.  Gastrointestinal: Negative for abdominal pain.  Neurological: Positive for dizziness, syncope, speech difficulty and light-headedness.  Psychiatric/Behavioral: Positive for decreased concentration.  Negative for agitation and hallucinations. The patient is nervous/anxious.        Objective:   Physical Exam Constitutional:      Comments: Patient is alert and cooperative but somewhat anxious in appearance  Neck:     Musculoskeletal: Neck supple.  Cardiovascular:     Rate and Rhythm: Normal rate and regular rhythm.  Pulmonary:     Effort: Pulmonary effort is normal.     Breath sounds: Normal breath sounds.  Musculoskeletal:     Right lower leg: No edema.     Left lower leg: No edema.  Neurological:     General: No focal deficit present.     Mental Status: She is oriented to person, place, and time.     Cranial Nerves: No cranial nerve deficit.        Assessment:     #  1 Patient has past history of CVA and possible recent TIA.  She presents with recent progressive concern for cognitive changes.  She clearly has some stress and anxiety issues which may be compounding.  She feels totally incapable of work and even driving at this point.  #2 hypertension.  Elevated reading today of 140/80 and she is had some extremely elevated readings at home putting reading yesterday 220/100  #3 increased stress and anxiety    Plan:     -We recommend she be taken out of work with [employer] and will plan a couple of months until she can be further assessed  -We recommend consider neuropsychological assessment  -We strongly advise close follow-up with neurology-she has in process of getting appointment  -Blood pressures up today and we recommended addition of low-dose amlodipine 2.5 mg and monitor blood pressure closely and reassess here in 2 weeks  -We made a look at further management of her anxiety  -Follow-up immediately for any new cognitive or neurologic deficits or changes  Eulas Post MD [facility] at [facility]   -

## 2018-11-15 ENCOUNTER — Ambulatory Visit: Payer: Self-pay | Admitting: Family Medicine

## 2018-11-15 NOTE — Telephone Encounter (Signed)
Not unless advised per neurology (since she is on the Xarelto)

## 2018-11-15 NOTE — Telephone Encounter (Signed)
Please advise 

## 2018-11-15 NOTE — Telephone Encounter (Signed)
Pt. And daughter asking if pt. Needs to be on aspirin. Looked on medication list and last OV. No aspirin listed. Is still on Xarelto.

## 2018-11-15 NOTE — Telephone Encounter (Signed)
Called patient and LMOVM to return call  [place] for [facility] to Discuss results / PCP / recommendations / Schedule patient  Per Dr. Elease Hashimoto: Not unless advised per neurology (since she is on the [place])  CRM Created.

## 2018-11-16 NOTE — Telephone Encounter (Signed)
Pt. Given Dr. Erick Blinks message. Would like to know if her FMLA and Disability paperwork is ready.

## 2018-11-16 NOTE — Telephone Encounter (Signed)
Called patient and let her know that her forms are ready for pick up at the front desk. Patient verbalized an understanding.

## 2018-11-17 ENCOUNTER — Telehealth: Payer: Self-pay | Admitting: *Deleted

## 2018-11-17 ENCOUNTER — Other Ambulatory Visit: Payer: Self-pay

## 2018-11-17 DIAGNOSIS — R4189 Other symptoms and signs involving cognitive functions and awareness: Secondary | ICD-10-CM

## 2018-11-17 NOTE — Telephone Encounter (Signed)
Please advise 

## 2018-11-17 NOTE — Telephone Encounter (Signed)
Called patient and gave her message from Dr. Elease Hashimoto. Patient verbalized an understanding.

## 2018-11-17 NOTE — Telephone Encounter (Signed)
done

## 2018-11-17 NOTE — Telephone Encounter (Signed)
Called patient and let her know that her completed FMLA paperwork is ready up at the front desk and ready for pick up. Patient verbalized an understanding.

## 2018-11-17 NOTE — Telephone Encounter (Signed)
Pt is calling to follow up on completion of the forms.  Please advise.   Kimberly Monroe (Patient) Kimberly Monroe (Patient) General - Other  Reason for CRM: pt/daughter Is calling in to check the status of FMLA, paperwork. Provided her the response per phone note dated 11/16/18. Daughter says that they came in to pick up but a form still wasn't completed.     [phone] Brawn

## 2018-11-17 NOTE — Telephone Encounter (Signed)
Copied from [place] [id number]. Topic: Referral - Status >> Nov 17, 2018 11:34 AM Kimberly Monroe wrote: Reason for CRM: pt and daughter is calling in to provide a location to where pt would like to be seen at for neuro.   Pt would like to see Dr Janet Berlin with [facility]  Daughter would like a call once referral has been sent to this location so that they will know.

## 2018-11-17 NOTE — Telephone Encounter (Signed)
Go ahead with referral to Dr Sima Matas- he is a clinical psychologist.   She will still need to follow up with her neurologist as well.

## 2018-11-17 NOTE — Telephone Encounter (Signed)
Please return paperwork to me when complete. There were 2 more sheets to be filled out. Thank you.

## 2018-11-22 ENCOUNTER — Other Ambulatory Visit: Payer: Self-pay | Admitting: Family

## 2018-11-22 ENCOUNTER — Other Ambulatory Visit: Payer: Self-pay | Admitting: Family Medicine

## 2018-11-22 DIAGNOSIS — Z86711 Personal history of pulmonary embolism: Secondary | ICD-10-CM

## 2018-11-22 DIAGNOSIS — I639 Cerebral infarction, unspecified: Secondary | ICD-10-CM

## 2018-11-26 ENCOUNTER — Ambulatory Visit: Payer: BLUE CROSS/BLUE SHIELD | Admitting: Family Medicine

## 2018-11-26 ENCOUNTER — Other Ambulatory Visit: Payer: Self-pay

## 2018-11-26 ENCOUNTER — Encounter: Payer: Self-pay | Admitting: Family Medicine

## 2018-11-26 VITALS — BP 122/78 | HR 58 | Temp 98.3°F | Ht 64.0 in | Wt 210.3 lb

## 2018-11-26 DIAGNOSIS — I10 Essential (primary) hypertension: Secondary | ICD-10-CM

## 2018-11-26 DIAGNOSIS — L819 Disorder of pigmentation, unspecified: Secondary | ICD-10-CM | POA: Diagnosis not present

## 2018-11-26 DIAGNOSIS — R4189 Other symptoms and signs involving cognitive functions and awareness: Secondary | ICD-10-CM

## 2018-11-26 NOTE — Progress Notes (Signed)
Subjective:     Patient ID: Kimberly Monroe, female   DOB: 03-Aug-1961, 58 y.o.   MRN: [id number]  HPI Patient is here for follow-up regarding hypertension.  Refer to previous note.  She has pending follow-up with neurologist March 3 and she will see neuropsychologist for concern for recent cognitive changes but will not see them until May.  Her blood pressure was not extremely high here in the office couple weeks ago but she had had some home readings occasionally over 734 systolic and around 287 diastolic.  She was on lisinopril 10 mg daily.  We added amlodipine 2.5 mg daily and her blood pressures are greatly improved.  She still has occasional readings around 681L systolic.  No headaches.  No dizziness.  She has noted some recent increased pigmentation left earlobe.  She does have history both of melanoma and squamous cell cancer and has seen skin surgery Center in the past  Past Medical History:  Diagnosis Date  . Allergy    seasonal  . Anemia   . Anxiety   . Basal cell cancer   . Depression   . History of blood clotting disorder   . Hx of cerebral artery stenosis   . Hypertension   . Melanoma ([place]) 2009   insitu  . Menorrhagia 02/10/2013  . Migraine    history of/none in years  . Ovarian cancer ([place]) 1995   left ovary  . Pulmonary embolism ([place])   . Squamous cell carcinoma   . Stroke [facility])    Past Surgical History:  Procedure Laterality Date  . ABDOMINAL HYSTERECTOMY  03/06/2013  . BREAST DUCTAL SYSTEM EXCISION  2009   L intraductal papilloma  . CEREBRAL ANGIOGRAM  2020  . [place]   twins, singleton  . IR ANGIO INTRA EXTRACRAN SEL COM CAROTID INNOMINATE BILAT MOD SED  11/05/2018  . IR ANGIO VERTEBRAL SEL SUBCLAVIAN INNOMINATE UNI L MOD SED  11/05/2018  . IR ANGIO VERTEBRAL SEL VERTEBRAL UNI R MOD SED  11/05/2018  . IR US GUIDE VASC ACCESS RIGHT  11/05/2018  . laparoscopy with ovarian cystectomy  1994   ovarian torsion  . LAPAROTOMY  1995   ovarian  thecoma  . MOHS SURGERY  2009   Dr Link Snuffer    reports that she has never smoked. She has never used smokeless tobacco. She reports current alcohol use. She reports that she does not use drugs. family history includes Breast cancer (age of onset: 55) in her mother; Cancer in her paternal uncle; Cancer (age of onset: 10) in her father; Colon cancer in her father; Stroke (age of onset: 28) in her sister. Allergies  Allergen Reactions  . Lime Flavor [Flavoring Agent] Anaphylaxis  . Amoxicillin Other (See Comments)    Patient stated,"my mom told me to never take it. I don't even know what type of reaction."  . Penicillins Other (See Comments)    Did it involve swelling of the face/tongue/throat, SOB, or low BP? Unknown Did it involve sudden or severe rash/hives, skin peeling, or any reaction on the inside of your mouth or nose? Unknown Did you need to seek medical attention at a hospital or doctor's office? Unknown When did it last happen? Childhood allergy If all above answers are "NO", may proceed with cephalosporin use.   Patient stated,"I was told not to take it."      Review of Systems  Constitutional: Negative for fatigue.  Eyes: Negative for visual disturbance.  Respiratory: Negative for  cough, chest tightness, shortness of breath and wheezing.   Cardiovascular: Negative for chest pain, palpitations and leg swelling.  Neurological: Negative for dizziness, seizures, syncope, weakness, light-headedness and headaches.       Objective:   Physical Exam Constitutional:      Appearance: She is well-developed.  Eyes:     Pupils: Pupils are equal, round, and reactive to light.  Neck:     Musculoskeletal: Neck supple.     Thyroid: No thyromegaly.     Vascular: No JVD.  Cardiovascular:     Rate and Rhythm: Normal rate and regular rhythm.     Heart sounds: No gallop.   Pulmonary:     Effort: Pulmonary effort is normal. No respiratory distress.     Breath sounds: Normal  breath sounds. No wheezing or rales.  Skin:    Comments:  she does have slightly hyperpigmented area of her left earlobe.  No nodular changes.  Neurological:     Mental Status: She is alert.        Assessment:     #1 hypertension-improved on current combination therapy with lisinopril and amlodipine  #2 history of CVA  #3 history of possible recent cognitive changes.  She has pending follow-up with neuropsychologist.  PHQ 9 score of 12-though she denies feeling significantly depressed at this time  #4 abnormal pigmented lesion left earlobe    Plan:     -Strongly advised to set up follow-up with dermatology and she will do so.  She has already established care with [facility] current medications -Continue close follow-up with neurology  Eulas Post MD [facility]

## 2018-11-26 NOTE — Patient Instructions (Signed)
Monitor BP and be in touch if consistently > 130/85.   Set up dermatology referral to have left earlobe checked

## 2018-11-29 NOTE — Telephone Encounter (Signed)
Patient called stating that she has just gotten off the phone with Dr. Ferne Coe office and they state that they have not received referral from office. Patient inquired if it can be resent. Please advise.

## 2018-11-30 ENCOUNTER — Telehealth: Payer: Self-pay | Admitting: Family Medicine

## 2018-11-30 NOTE — Telephone Encounter (Signed)
Faxed to the office of  Edgardo Roys, PSYD Psychologist in [place], [place] Address: [place], [place] [address] Phone: [phone] Fax [phone]

## 2018-11-30 NOTE — Telephone Encounter (Signed)
Copied from [place] [id number]. Topic: General - Other >> Nov 30, 2018 10:13 AM Keene Breath wrote: Reason for CRM: [place] with [facility] call regarding a referral - Ref# [id number].  She needs some clarification on this referral.  Please call back at [phone]

## 2018-12-03 ENCOUNTER — Encounter: Payer: BLUE CROSS/BLUE SHIELD | Attending: Psychology | Admitting: Psychology

## 2018-12-03 ENCOUNTER — Encounter: Payer: Self-pay | Admitting: Psychology

## 2018-12-03 DIAGNOSIS — R413 Other amnesia: Secondary | ICD-10-CM | POA: Diagnosis not present

## 2018-12-03 DIAGNOSIS — R4189 Other symptoms and signs involving cognitive functions and awareness: Secondary | ICD-10-CM | POA: Diagnosis not present

## 2018-12-03 DIAGNOSIS — R29818 Other symptoms and signs involving the nervous system: Secondary | ICD-10-CM | POA: Diagnosis not present

## 2018-12-03 NOTE — Progress Notes (Addendum)
Neuropsychological Consultation   Patient:   Kimberly Monroe   DOB:   30-Aug-1961  MR Number:  [id number]  Location:  [facility] [place], [place] [id number] [place] [address] Dept: [phone]           Date of Service:   12/03/2018  Start Time:   8 AM End Time:   9 AM  Provider/Observer:  Kimberly Monroe, Psy.D.       Clinical Neuropsychologist       Billing Code/Service: Neurobehavioral status exam  Reason for Service:  Kimberly Monroe is a 58 year old female referred by Dr. Elease Monroe for neuropsychological evaluation and consultation.  The patient had a significant event last year while at work.  This occurred in March 2019.  The patient reports that she has been dealing with very high blood pressure and began having acute symptoms of vision loss and confusion.  The patient was taken from her office to the emergency department at [facility].  She was initially diagnosed with vertigo.  Follow-up 1 month later at [facility] neurology they diagnosed her with stroke.  She had abnormal MRI with indications of chronic small vessel disease as well as a 1 cm area of signal abnormality in the left superior frontal gyrus that was at the time felt to be subacute to chronic infarction.  Follow-up MRI conducted 11/04/2018 after a syncopal event with dizziness and other persistent cognitive difficulties showed unchanged 1.1 cm T2 hyperintensity lesion in the left superior frontal gyrus.  While it was felt to be indeterminate as to specific etiological factors the stability from the MRI on 01/2018 was felt to exclude infarct as an etiology.  One consideration was a low-grade neoplasm as a possibility and follow-up was warranted.  It was also continued to be indicative of mild chronic small vessel ischemic disease.  The patient has a history of melanoma, squamous cell carcinoma of skin,  basal cell carcinoma, pulmonary embolism, CVA/TIA, as well as depression and anxiety.  During the interview today, the patient reports that last year she had a stroke and occurred in March 2019.  However, we are not sure about whether there is been any type of acute subacute infarction.  The patient reports that she has had significant elevated blood pressure.  The patient was taken by ambulance from her place of work to the emergency department in [facility] and was initially diagnosed with vertigo.  Follow-up with [facility] neurology initially felt that her MRI was indicative of it stroke.  This MRI was 1 month after the event in March.  More recent MRI has called into question the potential etiological factors related to the 1.1 cm lesion identified in the left superior frontal gyrus.  However, there is clear indications of mild chronic small vessel ischemic disease and the patient does have a history of pulmonary and cardiovascular issues.  The patient has had significant elevated blood pressure readings that are being addressed cardiologically.  The patient reports that she has had significant cognitive and memory symptoms that she and her daughter both report really started developing in March 2019.  The patient reports that she has had difficulty with information processing speed as well as just general executive functioning and processing of information.  The patient describes memory difficulties and short-term memory difficulties which by the descriptions of the patient appear to be more retrieval deficits then then an inability to store  or organize information.  The patient describes balance issues and comprehension issues.  The patient also acknowledges significant anxiety, frustration and depressive responses both to these current symptoms and difficulties as well as having anxiety and depressive symptoms in the past.  The patient has had a number of cardiovascular/pulmonary/other medical issues to cope  with.  The patient is clearly distressed by her current cognitive and memory difficulties and the patient has not returned to driving or return to work.  The patient is quite anxious and worried that she will make significant errors at work and worried that something may happen if she were to try to drive.  The patient had a recent MRI of her brain done without contrast on 11/04/2018.  The impressions from this MRI showed no acute intracranial abnormality.  There was an unchanged 1.1 cm T2 hyperintense of lesion in the left superior frontal gyrus.  This finding was seen as remaining intermediate and stable from 01/2018.  There was also mild chronic small vessel ischemic disease noted.   Current Status:  The patient describes ongoing issues with information processing and executive functioning, short-term memory issues that appear primarily related to retrieval, balance issues as well as problems with comprehension and understanding.  Reliability of Information: The information is derived from 1 hour face-to-face clinical interview with the patient as well as review of available medical records.  Behavioral Observation: Kimberly Monroe  presents as a 58 y.o.-year-old Right Caucasian Female who appeared her stated age. her dress was Appropriate and she was Well Groomed and her manners were Appropriate to the situation.  her participation was indicative of Appropriate and Attentive behaviors.  There were not any physical disabilities noted.  she displayed an appropriate level of cooperation and motivation.     Interactions:    Active Appropriate  Attention:   within normal limits and attention span and concentration were age appropriate  Memory:   abnormal; remote memory intact, recent memory impaired  Visuo-spatial:  not examined  Speech (Volume):  normal  Speech:   normal; normal  Thought Process:  Coherent and Relevant  Though Content:  WNL; not suicidal and not  homicidal  Orientation:   person, place, time/date and situation  Judgment:   Good  Planning:   Good  Affect:    Appropriate  Mood:    Anxious  Insight:   Good  Intelligence:   normal  Marital Status/Living: The patient is currently living with her spouse and daughter.  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  Substance Use:  No concerns of substance abuse are reported.    Education:   HS Graduate  Medical History:   Past Medical History:  Diagnosis Date  . Allergy    seasonal  . Anemia   . Anxiety   . Basal cell cancer   . Cognitive changes   . Depression   . History of blood clotting disorder   . Hx of cerebral artery stenosis   . Hypertension   . Melanoma ([place]) 2009   insitu  . Menorrhagia 02/10/2013  . Migraine    history of/none in years  . Ovarian cancer ([place]) 1995   left ovary  . Pulmonary embolism ([place])   . Squamous cell carcinoma   . Stroke [facility])         Psychiatric History:  The patient does have a past history of depression anxiety but denies that these are producing significant symptoms at the current  time.  Family Med/Psych History:  Family History  Problem Relation Age of Onset  . Cancer Father 67       colon cancer  . Colon cancer Father   . Breast cancer Mother 14  . Cancer Paternal Uncle        prostate  . Stroke Sister 32    Risk of Suicide/Violence: low the patient denies any suicidal or homicidal ideation.  Impression/DX:  Kimberly Monroe is a 58 year old female referred by Dr. Elease Monroe for neuropsychological evaluation and consultation.  The patient had a significant event last year while at work.  This occurred in March 2019.  The patient reports that she has been dealing with very high blood pressure and began having acute symptoms of vision loss and confusion.  The patient was taken from her office to the emergency department at [facility].  She was initially  diagnosed with vertigo.  Follow-up 1 month later at [facility] neurology they diagnosed her with stroke.  She had abnormal MRI with indications of chronic small vessel disease as well as a 1 cm area of signal abnormality in the left superior frontal gyrus that was at the time felt to be subacute to chronic infarction.  Follow-up MRI conducted 11/04/2018 after a syncopal event with dizziness and other persistent cognitive difficulties showed unchanged 1.1 cm T2 hyperintensity lesion in the left superior frontal gyrus.  While it was felt to be indeterminate as to specific etiological factors the stability from the MRI on 01/2018 was felt to exclude infarct as an etiology.  One consideration was a low-grade neoplasm as a possibility and follow-up was warranted.  It was also continued to be indicative of mild chronic small vessel ischemic disease.  The patient has a history of melanoma, squamous cell carcinoma of skin, basal cell carcinoma, pulmonary embolism, CVA/TIA, as well as depression and anxiety with past events including suicidal ideation.  During the interview today, the patient reports that last year she had a stroke and occurred in March 2019.  However, we are not sure about whether there is been any type of acute subacute infarction.  The patient reports that she has had significant elevated blood pressure.  The patient was taken by ambulance from her place of work to the emergency department in [facility] and was initially diagnosed with vertigo.  Follow-up with [facility] neurology initially felt that her MRI was indicative of it stroke.  This MRI was 1 month after the event in March.  More recent MRI has called into question the potential etiological factors related to the 1.1 cm lesion identified in the left superior frontal gyrus.  However, there is clear indications of mild chronic small vessel ischemic disease and the patient does have a history of pulmonary and cardiovascular issues.  The patient has had  significant elevated blood pressure readings that are being addressed cardiologically.  The patient reports that she has had significant cognitive and memory symptoms that she and her daughter both report really started developing in March 2019.  The patient reports that she has had difficulty with information processing speed as well as just general executive functioning and processing of information.  The patient describes memory difficulties and short-term memory difficulties which by the descriptions of the patient appear to be more retrieval deficits then then an inability to store or organize information.  The patient describes balance issues and comprehension issues.  The patient also acknowledges significant anxiety, frustration and depressive responses both to these current  symptoms and difficulties as well as having anxiety and depressive symptoms in the past.  The patient has had a number of cardiovascular/pulmonary/other medical issues to cope with.  The patient is clearly distressed by her current cognitive and memory difficulties and the patient has not returned to driving or return to work.  The patient is quite anxious and worried that she will make significant errors at work and worried that something may happen if she were to try to drive.  The patient had a recent MRI of her brain done without contrast on 11/04/2018.  The impressions from this MRI showed no acute intracranial abnormality.  There was an unchanged 1.1 cm T2 hyperintense of lesion in the left superior frontal gyrus.  This finding was seen as remaining intermediate and stable from 01/2018.  There was also mild chronic small vessel ischemic disease noted.   The patient describes ongoing issues with information processing and executive functioning, short-term memory issues that appear primarily related to retrieval, balance issues as well as problems with comprehension and understanding.  Disposition/Plan:  We have set the  patient up for formal neuropsychological testing.  The patient will complete the Wechsler Adult Intelligence Scale-IV as well as the Wechsler Memory Scale-IV.  Once these are completed we would also include finger tapping test and grip strength test to determine any lateralization to her dexterity issues.  Diagnosis:    Neurocognitive deficits  Memory loss         Electronically Signed   _______________________ Kimberly Monroe, Psy.D.

## 2018-12-05 ENCOUNTER — Other Ambulatory Visit: Payer: Self-pay | Admitting: Family

## 2018-12-07 ENCOUNTER — Encounter: Payer: Self-pay | Admitting: Neurology

## 2018-12-07 ENCOUNTER — Ambulatory Visit (INDEPENDENT_AMBULATORY_CARE_PROVIDER_SITE_OTHER): Payer: BLUE CROSS/BLUE SHIELD | Admitting: Neurology

## 2018-12-07 VITALS — BP 139/89 | HR 62 | Ht 64.0 in | Wt 210.5 lb

## 2018-12-07 DIAGNOSIS — R410 Disorientation, unspecified: Secondary | ICD-10-CM | POA: Insufficient documentation

## 2018-12-07 DIAGNOSIS — R9089 Other abnormal findings on diagnostic imaging of central nervous system: Secondary | ICD-10-CM | POA: Insufficient documentation

## 2018-12-07 MED ORDER — LAMOTRIGINE 100 MG PO TABS: 100.0000 mg | ORAL_TABLET | Freq: Two times a day (BID) | ORAL | 6 refills | Status: DC

## 2018-12-07 MED ORDER — LAMOTRIGINE 25 MG PO TABS: ORAL_TABLET | ORAL | 0 refills | Status: DC

## 2018-12-07 NOTE — Progress Notes (Addendum)
PATIENT: Kimberly Monroe DOB: 18-Feb-1961  Chief Complaint  Patient presents with  . Brain Mass    MMSE 29/30 - 9 animals.  She is here with her daughter, Kimberly Monroe, to follow up from ED visit.  She would like to discuss her test results further.  She is concerned about her declining cognitive function and slow recall. She has history of stroke.    Marland Kitchen PCP    Burchette, Alinda Sierras, MD (referred from hospital)     [place] is of 58 year old female, seen in request by her primary care physician Eulas Post, for evaluation of memory loss, abnormal MRI scans, initial evaluation was on December 07, 2018.  I have reviewed and summarized the referring note from the referring physician.  I was able to review hospital discharge on November 05, 2018.  Patient was admitted to hospital for evaluation of right facial droop, right-sided weakness, per patient husband she was under a lot of stress at work and home.  On November 04, 2018, it was her routine to talk with her friend on the phone while she was talking on the phone, she was noted by her friend that she did not make sense, does not sounds right, will have her husband check on her, she was noted to be mildly confused, right facial droop, the spell last about 40 minutes, last time she went to work was on January 29.   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  CT  angiogram of head, later four-vessel angiogram of head and neck showed mild stenosis at the proximal of the superior division of left MCA, 50 to 70% stenosis of right PCA,  Patient has a history of PE, already taking Xarelto, also has vascular risk factor of hyperlipidemia, hypertension,  REVIEW OF SYSTEMS: Full 14 system review of systems performed and notable only for excessive thirst, flushing, memory loss, dizziness, headaches, numbness facial droopy, agitation, fusion, decreased concentration, anxiety, All other review of systems were negative.  ALLERGIES: Allergies  Allergen Reactions  . Lime Flavor [Flavoring Agent] Anaphylaxis  . Amoxicillin Other (See Comments)    Patient stated,"my mom told me to never take it. I don't even know what type of reaction."  . Penicillins Other (See Comments)    Did it involve swelling of the face/tongue/throat, SOB, or low BP? Unknown Did it involve sudden or severe rash/hives, skin peeling, or any reaction on the inside of your mouth or nose? Unknown Did you need to seek medical attention at a hospital or doctor's office? Unknown When did it last happen? Childhood allergy If all above answers are "NO", may proceed with cephalosporin use.   Patient stated,"I was told not to take it."     HOME MEDICATIONS: Current Outpatient Medications  Medication Sig Dispense Refill  . amLODipine (NORVASC) 2.5 MG tablet Take 1 tablet (2.5 mg total) by mouth daily. 30 tablet 3  . atorvastatin (LIPITOR) 20 MG tablet TAKE 1 TABLET BY MOUTH EVERY  DAY 90 tablet 3  . lisinopril (PRINIVIL,ZESTRIL) 10 MG tablet TAKE 1 TABLET (10 MG TOTAL) BY MOUTH DAILY. (Patient taking differently: Take 10 mg by mouth every morning. ) 90 tablet 1  . Vitamin D, Ergocalciferol, (DRISDOL) 1.25 MG (50000 UT) CAPS capsule TAKE ONE CAPSULE BY MOUTH WEEKLY AS ONE DOSE 12 capsule 2  . XARELTO 20 MG TABS tablet TAKE 1 TABLET (20 MG TOTAL) BY MOUTH DAILY WITH SUPPER. 90 tablet 2   No  current facility-administered medications for this visit.     PAST MEDICAL HISTORY: Past Medical History:  Diagnosis Date  . Allergy    seasonal  . Anemia   . Anxiety   . Basal cell cancer   . Cognitive changes   . Depression   . History of blood clotting disorder   . Hx of cerebral artery stenosis   . Hypertension   . Melanoma ([place]) 2009   insitu  . Menorrhagia 02/10/2013  . Migraine    history of/none in years  . Ovarian cancer ([place]) 1995   left ovary  . Pulmonary embolism ([place])   . Squamous cell carcinoma   . Stroke [facility])     PAST SURGICAL HISTORY: Past Surgical History:  Procedure Laterality Date  . ABDOMINAL HYSTERECTOMY  03/06/2013  . BREAST DUCTAL SYSTEM EXCISION  2009   L intraductal papilloma  . CEREBRAL ANGIOGRAM  2020  . [place]   twins, singleton  . IR ANGIO INTRA EXTRACRAN SEL COM CAROTID INNOMINATE BILAT MOD SED  11/05/2018  . IR ANGIO VERTEBRAL SEL SUBCLAVIAN INNOMINATE UNI L MOD SED  11/05/2018  . IR ANGIO VERTEBRAL SEL VERTEBRAL UNI R MOD SED  11/05/2018  . IR US GUIDE VASC ACCESS RIGHT  11/05/2018  . laparoscopy with ovarian cystectomy  1994   ovarian torsion  . 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 MOHS SURGERY  2009   Dr Link Snuffer    FAMILY HISTORY: Family History  Problem Relation Age of Onset  . Cancer Father 62       colon cancer  . Colon cancer Father   . Breast cancer Mother 46  . Cancer Paternal Uncle        prostate  . Stroke Sister 19    SOCIAL HISTORY: Social History   Socioeconomic History  . Marital status: Married    Spouse name: Not on file  . Number of children: 3  . Years of education: college  . Highest education level: Not on file  Occupational History  . Occupation: physical therapist  Social Needs  . Financial resource strain: Not on file  . Food insecurity:    Worry: Not on file    Inability: Not on file  . Transportation needs:    Medical: Not on file    Non-medical: Not on file    Tobacco Use  . Smoking status: Never Smoker  . Smokeless tobacco: Never Used  . Tobacco comment: never used cigarettes  Substance and Sexual Activity  . Alcohol use: Yes    Alcohol/week: 0.0 standard drinks    Comment: wine-occassionally  . Drug use: No  . Sexual activity: Not on file  Lifestyle  . Physical activity:    Days per week: Not on file    Minutes per session: Not on file  . Stress: Not on file  Relationships  . Social connections:    Talks on phone: Not on file    Gets together: Not on file  Attends religious service: Not on file    Active member of club or organization: Not on file    Attends meetings of clubs or organizations: Not on file    Relationship status: Not on file  . Intimate partner violence:    Fear of current or ex partner: Not on file    Emotionally abused: Not on file    Physically abused: Not on file    Forced sexual activity: Not on file  Other Topics Concern  . Not on file  Social History Narrative   Lives at home with husband and family.   Right-handed.   Caffeine use:  2-3 cups some days.     PHYSICAL EXAM   Vitals:   12/07/18 1310  BP: 139/89  Pulse: 62  Weight: 210 lb 8 oz (95.5 kg)  Height: 5\' 4"  (1.626 m)    Not recorded      Body mass index is 36.13 kg/m.  PHYSICAL EXAMNIATION:  Gen: NAD, conversant, well nourised, obese, well groomed                     Cardiovascular: Regular rate rhythm, no peripheral edema, warm, nontender. Eyes: Conjunctivae clear without exudates or hemorrhage Neck: Supple, no carotid bruits. Pulmonary: Clear to auscultation bilaterally   NEUROLOGICAL EXAM: MMSE - Mini Mental State Exam 12/07/2018  Orientation to time 4  Orientation to Place 5  Registration 3  Attention/ Calculation 5  Recall 3  Language- name 2 objects 2  Language- repeat 1  Language- follow 3 step command 3  Language- read & follow direction 1  Write a sentence 1  Copy design 1  Total score 29  animal naming  9.   CRANIAL NERVES: CN II: Visual fields are full to confrontation.  Pupils are round equal and briskly reactive to light. CN III, IV, VI: extraocular movement are normal. No ptosis. CN V: Facial sensation is intact to pinprick in all 3 divisions bilaterally. Corneal responses are intact.  CN VII: Face is symmetric with normal eye closure and smile. CN VIII: Hearing is normal to rubbing fingers CN IX, X: Palate elevates symmetrically. Phonation is normal. CN XI: Head turning and shoulder shrug are intact CN XII: Tongue is midline with normal movements and no atrophy.  MOTOR: There is no pronator drift of out-stretched arms. Muscle bulk and tone are normal. Muscle strength is normal.  REFLEXES: Reflexes are 2+ and symmetric at the biceps, triceps, knees, and ankles. Plantar responses are flexor.  SENSORY: Intact to light touch, pinprick, positional sensation and vibratory sensation are intact in fingers and toes.  COORDINATION: Rapid alternating movements and fine finger movements are intact. There is no dysmetria on finger-to-nose and heel-knee-shin.    GAIT/STANCE: Posture is normal. Gait is steady with normal steps, base, arm swing, and turning. Heel and toe walking are normal. Tandem gait is normal.  Romberg is absent.   DIAGNOSTIC DATA (LABS, IMAGING, TESTING) - I reviewed patient records, labs, notes, testing and imaging myself where available.   ASSESSMENT AND PLAN  Amanpreet Delmont Stene is a 58 y.o. female   Confusion Spells.  Differentiation diagnosis including mood disorder related, lack of focus, medication side effect,   Imaging study, angiogram showed no significant abnormalities to explain her recurrent spells, stable left frontal T2 hyperintensity lesions, a repeat MRI of the brain with without contrast 6 months.  Remote possibility of seizure, proceed with EEG, add on lamotrigine, titrating to 100 mg twice a day document  all event  Laboratory  evaluations,     Marcial Pacas, M.D. Ph.D.  [facility] [address], [place], [place] [address] Ph: [phone] Fax: [phone]  CC: Eulas Post, MD

## 2018-12-08 LAB — ANA W/REFLEX: Anti Nuclear Antibody(ANA): NEGATIVE

## 2018-12-08 LAB — RPR: RPR Ser Ql: NONREACTIVE

## 2018-12-08 LAB — VITAMIN B12: Vitamin B-12: 331 pg/mL (ref 232–1245)

## 2018-12-08 LAB — C-REACTIVE PROTEIN: CRP: <1 mg/L (ref 0–10)

## 2018-12-08 LAB — THYROID PEROXIDASE ANTIBODY: Thyroperoxidase Ab SerPl-aCnc: 6 IU/mL (ref 0–34)

## 2018-12-08 LAB — SEDIMENTATION RATE: Sed Rate: 3 mm/hr (ref 0–40)

## 2018-12-08 NOTE — Telephone Encounter (Signed)
I called [place] with [facility] PMR  At [phone] she checked her records and she stated that there was nothing  She needed the pt came in on her first visit and will be returing  on may 26

## 2018-12-09 ENCOUNTER — Other Ambulatory Visit: Payer: BLUE CROSS/BLUE SHIELD

## 2018-12-24 DIAGNOSIS — D485 Neoplasm of uncertain behavior of skin: Secondary | ICD-10-CM | POA: Diagnosis not present

## 2018-12-24 DIAGNOSIS — D225 Melanocytic nevi of trunk: Secondary | ICD-10-CM | POA: Diagnosis not present

## 2018-12-24 DIAGNOSIS — L989 Disorder of the skin and subcutaneous tissue, unspecified: Secondary | ICD-10-CM | POA: Diagnosis not present

## 2018-12-24 DIAGNOSIS — L814 Other melanin hyperpigmentation: Secondary | ICD-10-CM | POA: Diagnosis not present

## 2018-12-24 DIAGNOSIS — Z85820 Personal history of malignant melanoma of skin: Secondary | ICD-10-CM | POA: Diagnosis not present

## 2018-12-28 ENCOUNTER — Other Ambulatory Visit: Payer: Self-pay

## 2018-12-28 ENCOUNTER — Encounter: Payer: BLUE CROSS/BLUE SHIELD | Attending: Psychology | Admitting: Psychology

## 2018-12-28 ENCOUNTER — Encounter: Payer: Self-pay | Admitting: Psychology

## 2018-12-28 DIAGNOSIS — R29818 Other symptoms and signs involving the nervous system: Secondary | ICD-10-CM | POA: Diagnosis not present

## 2018-12-28 DIAGNOSIS — R4189 Other symptoms and signs involving cognitive functions and awareness: Secondary | ICD-10-CM | POA: Insufficient documentation

## 2018-12-28 DIAGNOSIS — R413 Other amnesia: Secondary | ICD-10-CM | POA: Diagnosis not present

## 2018-12-28 NOTE — Progress Notes (Signed)
Mental Status Examination & Behavior Observations: The patient arrived on time to her 8:00am testing appointment. She was administered the Wechsler Adult Intelligence Scale, 4th Edition, Wechsler Memory Scale, 4th Edition (Adult Battery), Finger Tapping Test, and Grip Strength. The evaluation lasted 240 minutes. She was alert and mostly oriented (e.g. reported date as 03/22/202 instead of 12/28/2018). She was casually dressed and appeared to have good hygiene. She ambulated independently without difficulty. Her spontaneous speech was mostly clear (e.g. stutter when anxious) and prosodic, with normal rate and tone but low volume. Mild to moderate word finding difficulties and imprecise were noted during conversation and some verbal measures (e.g. WAIS-IV Similarities). Mood was mild to moderately anxious and depressed with appropriate affect. Thought processes were slightly disorganized and scattered at times but mostly logical and goal directed. Insight and Judgement appeared slightly poor and fair, respectively.    She was cooperative with all assigned tasks and appeared to give good effort. She did not have any difficulty hearing or understanding test instructions and did not require additional prompting. She exhibited poor frustration tolerance on questions she did not know or tasks that were more difficult. She required several breaks due to agitation and fatigue. She made several self-defeating remarks about her abilities throughout the evaluation and appeared to be having difficulty coping with her cognitive, emotional, and physical symptoms.   Results of the WAIS-IV:   Composite Score Summary  Scale Sum of Scaled Scores Composite Score Percentile Rank 95% Conf. Interval Qualitative Description  Verbal Comprehension 36 VCI 110 75 104-115 High Average  Perceptual Reasoning 29 PRI 98 45 92-104 Average  Working Memory 17 WMI 92 30 86-99 Average  Processing Speed 12 PSI 79 8 73-89 Borderline  Full  Scale 94 FSIQ 96 39 92-100 Average  General Ability 65 GAI 104 61 99-109 Average   Index Level Discrepancy Comparisons  Comparison Score 1 Score 2 Difference Critical Value .05 Significant Difference Y/N Base Rate by Overall Sample  VCI - PRI 110 98 12 8.31 Y 19.3  VCI - WMI 110 92 18 8.82 Y 8.2  VCI - PSI 110 79 31 10.19 Y 2.9  PRI - WMI 98 92 6 9.74 N 32.5  PRI - PSI 98 79 19 11.00 Y 10.7  WMI - PSI 92 79 13 11.38 Y 19.1  FSIQ - GAI 96 104 -8 3.51 Y 5.7  Working Memory Process Score Summary  Process Score Raw Score Scaled Score Percentile Rank Base Rate SEM  Digit Span Forward 10 10 50 -- 1.44  Digit Span Backward 9 11 63 -- 1.27  Digit Span Sequencing 6 7 16  -- 1.37   Results of the WMS-IV (Adult Battery):  Brief Cognitive Status Exam Classification  Age Years of Education Raw Score Classification Level Base Rate  57 years 7 months 16 50 Low Average 22.1   Index Score Summary  Index Sum of Scaled Scores Index Score Percentile Rank 95% Confidence Interval Qualitative Descriptor  Auditory Memory (AMI) 48 112 79 105-118 High Average  Visual Memory (VMI) 45 107 68 101-112 Average  Visual Working Memory (VWMI) 16 88 21 82-96 Low Average  Immediate Memory (IMI) 48 113 81 106-119 High Average  Delayed Memory (DMI) 45 108 70 101-114 Average   Primary Subtest Scaled Score Summary  Subtest Domain Raw Score Scaled Score Percentile Rank  Logical Memory I AM 27 11 63  Logical Memory II AM 22 10 50  Verbal Paired Associates I AM 45 14 91  Verbal Paired  Associates II AM 12 13 84  Designs I VM 81 13 84  Designs II VM 66 13 84  Visual Reproduction I VM 34 10 50  Visual Reproduction II VM 20 9 37  Spatial Addition VWM 9 8 25   Symbol Span VWM 17 8 25    Auditory Memory Process Score Summary  Process Score Raw Score Scaled Score Percentile Rank Cumulative Percentage (Base Rate)  LM II Recognition 27 - - >75%  VPA II Recognition 38 - - 26-50%   Visual Memory Process Score  Summary  Process Score Raw Score Scaled Score Percentile Rank Cumulative Percentage (Base Rate)  DE I Content 43 15 95 -  DE I Spatial 14 8 25  -  DE II Content 42 16 98 -  DE II Spatial 10 9 37 -  DE II Recognition 21 - - >75%  VR II Recognition 7 - - >75%   Results of Finger Tapping Test:  Dominant Hand (Rt.): 43.2  Non-Dominant Hand: 51.4   Non-Dominant fine motor coordination (finger tapping) significantly greater than dominant side   Results of Grip Strength:  Dominant Hand (Rt.): 17 kg (approx. Z= 0.0, 50th percentile)  Non-Dominant Hand: 26 kg (approx. Z>1.0, >84th percentile)   Non-Dominant hand (grip strength) significantly greater than dominant side

## 2018-12-29 ENCOUNTER — Telehealth: Payer: Self-pay

## 2018-12-29 ENCOUNTER — Ambulatory Visit (INDEPENDENT_AMBULATORY_CARE_PROVIDER_SITE_OTHER): Payer: BLUE CROSS/BLUE SHIELD | Admitting: Family Medicine

## 2018-12-29 DIAGNOSIS — Z8673 Personal history of transient ischemic attack (TIA), and cerebral infarction without residual deficits: Secondary | ICD-10-CM

## 2018-12-29 DIAGNOSIS — I10 Essential (primary) hypertension: Secondary | ICD-10-CM

## 2018-12-29 DIAGNOSIS — R410 Disorientation, unspecified: Secondary | ICD-10-CM

## 2018-12-29 DIAGNOSIS — C4321 Malignant melanoma of right ear and external auricular canal: Secondary | ICD-10-CM | POA: Diagnosis not present

## 2018-12-29 NOTE — Progress Notes (Signed)
Patient ID: Kimberly Monroe, female   DOB: [birth year], 58 y.o.   MRN: [id number]  Virtual Visit via Video Note  I connected with Urbano Heir on 12/29/18 at  2:30 PM EDT by a video enabled telemedicine application and verified that I am speaking with the correct person using two identifiers.  Location patient: home Location provider:work or home office Persons participating in the virtual visit: patient, provider  I discussed the limitations of evaluation and management by telemedicine and the availability of in person appointments. The patient expressed understanding and agreed to proceed.  We attempted WebEx visit but we had technical difficulties   HPI: Patient has history of hypertension, bilateral pulmonary emboli, history of melanoma, depression, CVA.  She had recently presented back in early February with concern for some cognitive deficits.  We referred her to a neuropsychologist for further testing and she had extensive testing just yesterday.  Final assessment is not back but she seemed to have some definite difficulties with processing.  We have not gotten recommendations from them yet regarding work.  She is currently on FMLA and out of work until April 6.  Patient also had some recent noted abnormal pigmentation left ear.  She [facility] and had biopsy which showed melanoma.  She would be getting Mohs surgery for that.  She saw neurologist recently and there is concern of possible seizure activity.  She was scheduled for EEG and also recommendation to start Lamictal which patient declined.  There was suggestion for 11-month follow-up MRI scan of the brain to reassess small left frontal brain mass which was unchanged from April 2019.  Patient plans to follow-up with [facility] neurology  Generally feels well at this time.  She feels like her mood is stabilized somewhat.   ROS: See pertinent positives and negatives per HPI.  Past Medical History:  Diagnosis Date  .  Allergy    seasonal  . Anemia   . Anxiety   . Basal cell cancer   . Cognitive changes   . Depression   . History of blood clotting disorder   . Hx of cerebral artery stenosis   . Hypertension   . Melanoma ([facility] [place]) 2009   insitu  . Menorrhagia 02/10/2013  . Migraine    history of/none in years  . Ovarian cancer ([place]) 1995   left ovary  . Pulmonary embolism ([place])   . Squamous cell carcinoma   . Stroke [facility])     Past Surgical History:  Procedure Laterality Date  . ABDOMINAL HYSTERECTOMY  03/06/2013  . BREAST DUCTAL SYSTEM EXCISION  2009   L intraductal papilloma  . CEREBRAL ANGIOGRAM  2020  . [place]   twins, singleton  . IR ANGIO INTRA EXTRACRAN SEL COM CAROTID INNOMINATE BILAT MOD SED  11/05/2018  . IR ANGIO VERTEBRAL SEL SUBCLAVIAN INNOMINATE UNI L MOD SED  11/05/2018  . IR ANGIO VERTEBRAL SEL VERTEBRAL UNI R MOD SED  11/05/2018  . IR US GUIDE VASC ACCESS RIGHT  11/05/2018  . laparoscopy with ovarian cystectomy  1994   ovarian torsion  . 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 MOHS SURGERY  2009   Dr Link Snuffer    Family History  Problem Relation Age of Onset  . Cancer Father 31       colon cancer  . Colon cancer Father   . Breast cancer Mother 26  . Cancer Paternal Uncle        prostate  .  Stroke Sister 76    SOCIAL HX: Non-smoker.  No regular alcohol.   Current Outpatient Medications:  .  amLODipine (NORVASC) 2.5 MG tablet, Take 1 tablet (2.5 mg total) by mouth daily., Disp: 30 tablet, Rfl: 3 .  atorvastatin (LIPITOR) 20 MG tablet, TAKE 1 TABLET BY MOUTH EVERY DAY, Disp: 90 tablet, Rfl: 3 .  lamoTRIgine (LAMICTAL) 100 MG tablet, Take 1 tablet (100 mg total) by mouth 2 (two) times daily., Disp: 60 tablet, Rfl: 6 .  lamoTRIgine (LAMICTAL) 25 MG tablet, 1 tablet twice a day for the first week 2 tablets twice a day for the second week 3 tablets twice a day for the third week 4 tablets twice a day for the fourth week  For total of 140 tablets  After  finish titration with small dose of lamotrigine 25 mg, change to lamotrigine 100 mg twice a day, Disp: 140 tablet, Rfl: 0 .  lisinopril (PRINIVIL,ZESTRIL) 10 MG tablet, TAKE 1 TABLET (10 MG TOTAL) BY MOUTH DAILY. (Patient taking differently: Take 10 mg by mouth every morning. ), Disp: 90 tablet, Rfl: 1 .  Vitamin D, Ergocalciferol, (DRISDOL) 1.25 MG (50000 UT) CAPS capsule, TAKE ONE CAPSULE BY MOUTH WEEKLY AS ONE DOSE, Disp: 12 capsule, Rfl: 2 .  XARELTO 20 MG TABS tablet, TAKE 1 TABLET (20 MG TOTAL) BY MOUTH DAILY WITH SUPPER., Disp: 90 tablet, Rfl: 2  EXAM:  VITALS per patient if applicable:  GENERAL: alert, oriented, appears well and in no acute distress  HEENT: atraumatic, conjunttiva clear, no obvious abnormalities on inspection of external nose and ears  NECK: normal movements of the head and neck  LUNGS: on inspection no signs of respiratory distress, breathing rate appears normal, no obvious gross SOB, gasping or wheezing  CV: no obvious cyanosis  MS: moves all visible extremities without noticeable abnormality  PSYCH/NEURO: pleasant and cooperative, no obvious depression or anxiety, speech and thought processing grossly intact  ASSESSMENT AND PLAN:  Discussed the following assessment and plan:  #1 mild cognitive deficit.  Patient currently involved in work-up with neuropsychology.  We reviewed recent testing from yesterday.  She has some processing deficits predominantly -Continue out of work until April 6.  We may be looking extending this somewhat depending on feedback from neuropsychology  #2 history of hypertension  #3 history of CVA  #4 history of transient confusion.  Question of seizure activity.  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  She will be getting Mohs surgery soon  -Patient plans to follow-up with [facility] neurology.  She has not yet started Lamictal.  She knows  not to start back driving until cleared by neurology       I discussed the assessment and treatment plan with the patient. The patient was provided an opportunity to ask questions and all were answered. The patient agreed with the plan and demonstrated an understanding of the instructions.   The patient was advised to call back or seek an in-person evaluation if the symptoms worsen or if the condition fails to improve as anticipated.  I provided 20 minutes of non-face-to-face time during this encounter.   Carolann Littler, MD

## 2018-12-29 NOTE — Telephone Encounter (Signed)
Patient called stating she was seen yesterday and about her disability, Patient saw Rodman Key yesterday. I called patient back and she states that her PCP Dr. Elease Hashimoto referred her over to Dr. Sima Matas. Dr. Elease Hashimoto wrote her FMLA papers through April 6 th and he will need to know the recommendations before then to know if she needs to be out of work or return to work full duty or with restrictions.

## 2018-12-31 ENCOUNTER — Ambulatory Visit: Payer: Self-pay | Admitting: Psychology

## 2019-01-05 ENCOUNTER — Telehealth: Payer: Self-pay | Admitting: Neurology

## 2019-01-05 NOTE — Telephone Encounter (Signed)
Called Patient but she does not no what she should do because test results are not back for neuro Psy . Please call and advise she scheduled with Dr. Krista Blue on the 15 th of April I tried to move her apt up.

## 2019-01-05 NOTE — Telephone Encounter (Signed)
Noted I will send to Joellen Jersey she is handling the link part thanks [place].

## 2019-01-05 NOTE — Telephone Encounter (Signed)
Called pt to see if she wanted to do a virtual visit and her phone was having some connectivity issues and she will call back.

## 2019-01-10 ENCOUNTER — Other Ambulatory Visit: Payer: Self-pay

## 2019-01-10 ENCOUNTER — Encounter: Payer: BLUE CROSS/BLUE SHIELD | Attending: Psychology | Admitting: Psychology

## 2019-01-10 DIAGNOSIS — R4189 Other symptoms and signs involving cognitive functions and awareness: Secondary | ICD-10-CM | POA: Diagnosis not present

## 2019-01-10 DIAGNOSIS — R2990 Unspecified symptoms and signs involving the nervous system: Secondary | ICD-10-CM | POA: Diagnosis not present

## 2019-01-10 DIAGNOSIS — R29818 Other symptoms and signs involving the nervous system: Secondary | ICD-10-CM | POA: Insufficient documentation

## 2019-01-10 DIAGNOSIS — R404 Transient alteration of awareness: Secondary | ICD-10-CM | POA: Diagnosis not present

## 2019-01-10 NOTE — Progress Notes (Addendum)
Neuropsychological Evaluation   Patient:  Kimberly Monroe   DOB: 07-26-1961  MR Number: [id number]  Location: [facility] PHYSICAL MEDICINE AND REHABILITATION [place], [place] [id number] [place] [id number] Dept: [phone]  Start: 4 PM End: 5 PM    Provider/Observer:     Edgardo Roys PsyD  Chief Complaint:      Chief Complaint  Patient presents with   Anxiety   Depression   Memory Loss    Reason For Service:     Kimberly Monroe is a 58 year old female referred by Dr. Elease Hashimoto for neuropsychological evaluation and consultation.  The patient had a significant event last year while at work.  This occurred in March 2019.  The patient reports that she has been dealing with very high blood pressure and began having acute symptoms of vision loss and confusion.  The patient was taken from her office to the emergency department at [facility].  She was initially diagnosed with vertigo.  Follow-up 1 month later at [facility] neurology they diagnosed her with stroke.  She had abnormal MRI with indications of chronic small vessel disease as well as a 1 cm area of signal abnormality in the left superior frontal gyrus that was at the time felt to be subacute to chronic infarction.  Follow-up MRI conducted 11/04/2018 after a syncopal event with dizziness and other persistent cognitive difficulties showed unchanged 1.1 cm T2 hyperintensity lesion in the left superior frontal gyrus.  While it was felt to be indeterminate as to specific etiological factors, the stability from the MRI on 01/2018 was felt to exclude infarct as an etiology.  One consideration was a low-grade neoplasm as a possibility and follow-up was warranted.  It was also continued to be indicative of mild chronic small vessel ischemic disease.  The patient has a history of melanoma, squamous cell carcinoma of skin, basal cell carcinoma, pulmonary  embolism, CVA/TIA, as well as depression and anxiety.   During the interview today, the patient reports that last year she had a stroke and occurred in March 2019.  However, we are not sure about whether there is been any type of acute subacute infarction.  The patient reports that she has had significant elevated blood pressure.  The patient was taken by ambulance from her place of work to the emergency department in [facility] and was initially diagnosed with vertigo.  Follow-up with [facility] neurology initially felt that her MRI was indicative of it stroke.  This MRI was 1 month after the event in March.  More recent MRI has called into question the potential etiological factors related to the 1.1 cm lesion identified in the left superior frontal gyrus.  However, there is clear indications of mild chronic small vessel ischemic disease and the patient does have a history of pulmonary and cardiovascular issues.  The patient has had significant elevated blood pressure readings that are being addressed cardiologically.  The patient reports that she has had significant cognitive and memory symptoms that she and her daughter both report really started developing in March 2019.  The patient reports that she has had difficulty with information processing speed as well as just general executive functioning and processing of information.  The patient describes memory difficulties and short-term memory difficulties which by the descriptions of the patient appear to be more retrieval deficits then then an inability to store or organize information.  The patient describes balance issues and  comprehension issues.  The patient also acknowledges significant anxiety, frustration and depressive responses both to these current symptoms and difficulties as well as having anxiety and depressive symptoms in the past.  The patient has had a number of cardiovascular/pulmonary/other medical issues to cope with.  The patient is clearly  distressed by her current cognitive and memory difficulties and the patient has not returned to driving or return to work.  The patient is quite anxious and worried that she will make significant errors at work and worried that something may happen if she were to try to drive.  The patient had a recent MRI of her brain done without contrast on 11/04/2018.  The impressions from this MRI showed no acute intracranial abnormality.  There was an unchanged 1.1 cm T2 hyperintense of lesion in the left superior frontal gyrus.  This finding was seen as remaining intermediate and stable from 01/2018.  There was also mild chronic small vessel ischemic disease noted.  Testing Administered:  The patient was administered the Wechsler Adult Intelligence Scale-IV as well as the Wechsler Memory Scale-IV.  The patient was also administered the finger tapping test and the hand dynamometer test as well to assess for potential lateralization to her symptoms.  Participation Level:   Active  Participation Quality:  Redirectable      Behavioral Observation:  Well Groomed, Alert, and Along with poor frustration tolerance at times..   Test Results:   Initially, the patient was administered the Wechsler Adult Intelligence Scale-IV.  Below you will find the composite scores for each other major domains assessed by this battery.  Composite Score Summary  Scale Sum of Scaled Scores Composite Score Percentile Rank 95% Conf. Interval Qualitative Description  Verbal Comprehension 36 VCI 110 75 104-115 High Average  Perceptual Reasoning 29 PRI 98 45 92-104 Average  Working Memory 17 WMI 92 30 86-99 Average  Processing Speed 12 PSI 79 8 73-89 Borderline  Full Scale 94 FSIQ 96 39 92-100 Average  General Ability 65 GAI 104 61 99-109 Average   The patient produced a full scale IQ score of 96 which is consistent with her slightly below achievement areas based on her educational and occupational history.  To adjust for some of  the variables that can be acutely impacted such as working memory and information processing speed we also looked at the patient's global abilities index score which was 1 of 4 falling in the average range at the 61st percentile.  Overall, the patient showed consistent performances with regard to her visual-spatial abilities, her working memory, and even better performance with regard to her verbal comprehension and social judgment abilities.  The patient showed deficits with regard to her processing speed index score.    Verbal Comprehension Subtests Summary  Subtest Raw Score Scaled Score Percentile Rank Reference Group Scaled Score SEM  Similarities 28 11 63 11 1.08  Vocabulary 50 13 84 15 0.73  Information 19 12 75 14 0.67  (Comprehension) 31 14 91 14 1.08   The patient produced a verbal comprehension index score of 110 which falls at the 75th percentile and is in the high average range of intellectual functioning.  The patient produced high average scores with regard to her vocabulary abilities as well as her social judgment and comprehension.  Upper end of the average range were seen for verbal reasoning and problem-solving as well as her general fund of information.  There were no individual subtest of this measure found to be impaired.  Perceptual Reasoning  Subtests Summary  Subtest Raw Score Scaled Score Percentile Rank Reference Group Scaled Score SEM  Block Design 28 8 25 6  1.04  Matrix Reasoning 15 10 50 8 0.95  Visual Puzzles 14 11 63 9 0.99  (Figure Weights) 17 13 84 11 0.99  (Picture Completion) 11 9 37 8 1.12   The patient produced a perceptual reasoning index score of 98 which falls at the 45th percentile and is in the average range.  The patient's performances on these measures showed some variability.  The patient's visual analysis and organizational abilities are somewhat down but overall her visual reasoning and problem-solving as well as abilities to identify anomalies  within a gestalt were all generally in the average range high average range.  Working Doctor, general practice Raw Score Scaled Score Percentile Rank Reference Group Scaled Score SEM  Digit Span 25 9 37 8 0.85  Arithmetic 12 8 25 9  1.04  (Letter-Number Seq.) 21 11 63 10 1.08   The patient produced a working memory index score of 92 which falls in the average range and at the 30th percentile.  Individual subtest making up this measure show that she produced scores in the average range with regard to her auditory encoding abilities without significant variability within subtest.  Processing Speed Subtests Summary  Subtest Raw Score Scaled Score Percentile Rank Reference Group Scaled Score SEM  Symbol Search 19 6 9 5  1.31  Coding 36 6 9 4  0.99  (Cancellation) 20 5 5 4  1.34    The patient produced a processing speed index score of 79 which falls at the 8th percentile and is in the borderline range of functioning.  This is a significantly impaired score with all subtest within this index falling in the impaired range.  The patient had difficulty with visual scanning and visual searching as well as overall speed of mental operations.  The patient had difficulty visually scanning both horizontal and vertical challenges.  Clearly, the patient is showing deficits with regard to her focus execute abilities and visual scanning and overall speed of mental operations.   ABILITY-MEMORY ANALYSIS  Ability Score:  GAI: 104 Date of Testing:  WAIS-IV; WMS-IV 2018/12/28  Predicted Difference Method   Index Predicted WMS-IV Index Score Actual WMS-IV Index Score Difference Critical Value  Significant Difference Y/N Base Rate  Auditory Memory 102 112 -10 8.95 Y 20-25%  Visual Memory 102 107 -5 8.82 N   Visual Working Memory 103 88 15 11.24 Y 10%  Immediate Memory 103 113 -10 10.35 N   Delayed Memory 102 108 -6 10.08 N   Statistical significance (critical value) at the .01 level.    The  patient was then administered the Wechsler Memory Scale-IV.  For comparison purposes we utilize the patient's general abilities index score from the Wechsler Adult Intelligence Scale to produce an estimation of memory function and to factor out issues related to her overall information processing speed deficits that were identified on the Wechsler Adult Intelligence Scale.  This produced predicted scores on these measures for comparison sake.  The patient produced a visual working memory score of 88 which falls at the 21st percentile and is in the low average range.  This performance is 15 points below predicted levels and indicates deficits with regard to visual working memory and visual encoding.  The patient's immediate memory combining both auditory and visual information produced an actual score of 113 which falls at the 81st percentile and is in the high average  range.  This immediate memory ability is significantly above predicted levels and there are no indications of any immediate deficits.  The patient's auditory versus visual memory scores also produced scores that were generally consistent with each other without indication of significant impairment in these areas.  The patient's delayed memory index score combining both auditory and visual was also consistent with each other without suggestion of deficits with regard to delayed memory.  Overall the patient's performance on this wide range of various memory and working memory measures do suggest that the only clear deficit had to do with visual working memory deficits with initial visual and auditory memory within normal limits as well as delayed memory functioning within normal limits.    Results of Finger Tapping Test:  Dominant Hand (Rt.): 43.2  Non-Dominant Hand: 51.4   Non-Dominant fine motor coordination (finger tapping) significantly greater than dominant side   The patient was administered the finger tapping test which looks at  fine motor functioning for both the right versus left hand.  There was a significant difference between dominant versus nondominant relative to normative comparison groups.  The patient clearly had slowed fine motor control for her dominant hand (right hand) which is mediated by left frontal regions.  Results of Grip Strength:  Dominant Hand (Rt.): 17 kg (approx. Z= 0.0, 50th percentile)  Non-Dominant Hand: 26 kg (approx. Z>1.0, >84th percentile)   Non-Dominant hand (grip strength) significantly greater than dominant side   The patient was then administered the grip strength test/hand dynamometer measure to assess for motor strength for right versus left hand.  The patient's dominant hand (right hand) was significantly below her performance for her nondominant left hand.  Not only was this difference significant from a normative comparison group but her nondominant hand was significantly stronger than her dominant hand suggesting again laterality to her deficits with left hemispheric motor functioning showing deficits relative to her right hemispheric motor functioning.  Summary of Results:   Overall, the results of the current neuropsychological evaluation are consistent with 2 variables.  The patient is showing some deficits with regard to left frontal brain regions particularly with motor strength and fine motor functioning of the left frontal regions.  The patient is also showing some weaknesses and deficits relative to working memory which is also considered to be mediated by the left superior frontal gyrus.  The patient also showed deficits with regard to overall information processing speed and speed of mental operations.  This was identified for visual scanning and visual visual searching abilities which are going to be heavily influenced by the patient's visual working memory.  However, overall mental processing speed are also affected by fasciculus white matter tracts which are identified as  having some degree of small vessel disease found on the MRI.  Other areas of cognitive functioning were all generally within the average to high average range with no other deficits identified beyond visual working memory and to a lesser degree auditory working memory and overall information processing speed.  The patient did quite well with regard to verbal comprehension including verbal reasoning and problem-solving, social judgment, vocabulary abilities as well as visual spatial and visual analysis abilities.  The patient showed no intermediate or long-term memory deficits for either visual or auditory information.  While this brain region that is affected can also have an impact on intrusive thinking and obsessive-compulsive types of issues associated with anxiety, patient also reports an increase in these types of symptoms.  It is likely that  the executive functioning issues are heavily influenced by her deficits with regard to working memory and ability to maintain focus and attention when trying to use her reasoning and problem-solving abilities.  Impression/Diagnosis:   Overall, the results of the current neuropsychological evaluation do show strengths and weaknesses that would be very consistent with the MRI showing left superior frontal gyrus.  The pattern of strengths and weaknesses have a heavy loading on the left frontal brain region including the patient's reported changes in expressive language/fluency, problems with working memory, executive functioning deficits and changes in fine motor control and muscle strength consistent with left frontal changes.  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  However, the patient and her daughter both report that these have been relatively acute changes and is still to be determined whether the patient suffered a stroke in March 2019 versus suggestions of a low-grade  neoplasm in this left superior frontal gyrus.  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  I will provide feedback to the patient and her daughter regarding the results of the current neuropsychological evaluation and will set out specific recommendations for her to try to adjust to the current cognitive deficits as well as dealing with the patient's other medical issues more broadly.  I do think with the adjustments the patient has been able to make and recommendations for accommodations to the deficits identified that the patient is able to return to work.  I would recommend that the patient initially return at 4 hours work schedule each day for the first 5 working days to keep stress down and allow for adjustment to the work environment.  Once the 5 half days are completed and go well, the patient will be able to go back to a regular schedule.    Diagnosis:    Axis I: Neurocognitive deficits   Ilean Skill, Psy.D. Neuropsychologist    WAIS-IV Wechsler Adult Intelligence Scale-Fourth Edition Score Report   Examinee Name Kimberly Monroe  Date of Report 2019/01/10  Justice Rocher [id number]  Years of Education   Date of Birth 17-Dec-1960  Primary Language   Gender Female  Handedness   Race/Ethnicity   Examiner Name Ilean Skill  Date of Testing 2018/12/28  Age at Testing 39 years 7 months  Retest? No   Comments: Composite Score Summary  Scale Sum of Scaled Scores Composite Score Percentile Rank 95% Conf. Interval Qualitative Description  Verbal Comprehension 36 VCI 110 75 104-115 High Average  Perceptual Reasoning 29 PRI 98 45 92-104 Average  Working Memory 17 WMI 92 30 86-99 Average  Processing Speed 12 PSI 79 8 73-89 Borderline  Full Scale 94 FSIQ 96 39 92-100 Average  General Ability 65 GAI 104 61 99-109  Average  Confidence Intervals are based on the Overall Average SEMs. The [facility] is an optional composite summary score that is less sensitive to the influence of working memory and processing speed. Because working memory and processing speed are vital to a comprehensive evaluation of cognitive ability, it should be noted that the [facility] does not have the breadth of construct coverage as the FSIQ.     Note. The vertical bars represent the standard error of measurement (SEM). SEM values are based on the examinee's age.   ANALYSIS   Index Level Discrepancy  Comparisons  Comparison Score 1 Score 2 Difference Critical Value .05 Significant Difference Y/N Base Rate by Overall Sample  VCI - PRI 110 98 12 8.31 Y 19.3  VCI - WMI 110 92 18 8.82 Y 8.2  VCI - PSI 110 79 31 10.19 Y 2.9  PRI - WMI 98 92 6 9.74 N 32.5  PRI - PSI 98 79 19 11.00 Y 10.7  WMI - PSI 92 79 13 11.38 Y 19.1  FSIQ - GAI 96 104 -8 3.51 Y 5.7  Base Rate by Overall Sample. Statistical significance (critical value) at the .05 level.  Verbal Comprehension Subtests Summary  Subtest Raw Score Scaled Score Percentile Rank Reference Group Scaled Score SEM  Similarities 28 11 63 11 1.08  Vocabulary 50 13 84 15 0.73  Information 19 12 75 14 0.67  (Comprehension) 31 14 91 14 1.08   Perceptual Reasoning Subtests Summary  Subtest Raw Score Scaled Score Percentile Rank Reference Group Scaled Score SEM  Block Design 28 8 25 6  1.04  Matrix Reasoning 15 10 50 8 0.95  Visual Puzzles 14 11 63 9 0.99  (Figure Weights) 17 13 84 11 0.99  (Picture Completion) 11 9 37 8 1.12   Working Doctor, general practice Raw Score Scaled Score Percentile Rank Reference Group Scaled Score SEM  Digit Span 25 9 37 8 0.85  Arithmetic 12 8 25 9  1.04  (Letter-Number Seq.) 21 11 63 10 1.08   Processing Speed Subtests Summary  Subtest Raw Score Scaled Score Percentile Rank Reference Group Scaled Score SEM  Symbol Search 19 6 9 5  1.31  Coding 36 6 9  4  0.99  (Cancellation) 20 5 5 4  1.34   Subtest Level Discrepancy Comparisons  Subtest Comparison Score 1 Score 2 Difference Critical Value .05 Significant Difference Y/N Base Rate  Digit Span - Arithmetic 9 8 1  2.57 N 42.80  Symbol Search - Coding 6 6 0 3.41 N   Statistical significance (critical value) at the .05 level.       DETERMINING STRENGTHS AND WEAKNESSES   Differences Between Subtest and Overall Mean of Subtest Scores  Subtest Subtest Scaled Score Mean Scaled Score Difference Critical Value .05 Strength or Weakness Base Rate  Block Design 8 9.40 -1.40 2.85  >25%  Similarities 11 9.40 1.60 2.82  >25%  Digit Span 9 9.40 -0.40 2.22  >25%  Matrix Reasoning 10 9.40 0.60 2.54  >25%  Vocabulary 13 9.40 3.60 2.03 S 5-10%  Arithmetic 8 9.40 -1.40 2.73  >25%  Symbol Search 6 9.40 -3.40 3.42  10-15%  Visual Puzzles 11 9.40 1.60 2.71  >25%  Information 12 9.40 2.60 2.19 S 15-25%  Coding 6 9.40 -3.40 2.97 W 10-15%  Overall: Mean = 9.40, Scatter =  7, Base rate =  48.9 Base Rate for Intersubtest Scatter is reported for 10 Subtests. Statistical significance (critical value) at the .05 level.   PROCESS ANALYSIS   Perceptual Reasoning Process Score Summary  Process Score Raw Score Scaled Score Percentile Rank SEM  Block Design No Time Bonus 28 8 25  1.12    Working Memory Process Score Summary  Process Score Raw Score Scaled Score Percentile Rank Base Rate SEM  Digit Span Forward 10 10 50 -- 1.44  Digit Span Backward 9 11 63 -- 1.27  Digit Span Sequencing 6 7 16  -- 1.37  Longest Digit Span Forward 6 -- -- 75.5 --  Longest Digit Span Backward 5 -- -- 45.0 --  Longest  Digit Span Sequence 4 -- -- 96.5 --  Longest Letter-Number Sequence 5 -- -- 74.5 --    Process Level Discrepancy Comparisons  Process Comparison Score 1 Score 2 Difference Critical Value .05 Significant Difference Y/N Base Rate  Block Design - Block Design No Time Bonus 8 8 0 3.08 N   Digit Span  Forward - Digit Span Backward 10 11 -1 3.65 N 46.8  Digit Span Forward - Digit Span Sequencing 10 7 3  3.60 N 21.1  Digit Span Backward - Digit Span Sequencing 11 7 4  3.56 Y 12.5  Longest Digit Span Forward - Longest Digit Span Backward 6 5 1  -- -- 83.5  Longest Digit Span Forward - Longest Digit Span Sequence 6 4 2  -- -- 36.0  Longest Digit Span Backward - Longest Digit Span Sequence 5 4 1  -- -- 13.5  Statistical significance (critical value) at the .05 level.   Raw Scores  Subtest Score Range Raw Score  Process Score Range Raw Score  Block Design 0-66 28  Block Design No Time Bonus 0-48 28  Similarities 0-36 28  Digit Span Forward 0-16 10  Digit Span 0-48 25  Digit Span Backward 0-16 9  Matrix Reasoning 0-26 15  Digit Span Sequencing 0-16 6  Vocabulary 0-57 50  Longest Digit Span Forward 0, 2-9 6  Arithmetic 0-22 12  Longest Digit Span Backward 0, 2-8 5  Symbol Search 0-60 19  Longest Digit Span Sequence 0, 2-9 4  Visual Puzzles 0-26 14  Longest Letter-Number Seq. 0, 2-8 5  Information 0-26 19      Coding 0-135 36      Letter-Number Seq. 0-30 21      Figure Weights 0-27 17      Comprehension 0-36 31      Cancellation 0-72 20      Picture Completion 0-24 11        End of Report  WMS-IV Wechsler Memory Scale-Fourth Edition Score Report   Examinee Name Kimberly Monroe  Date of Report 2019/01/10  Kingsley Spittle ID [id number]  Years of Education 69  Date of Birth Feb 18, 1961  Home Language   Gender Female  Handedness Not Specified  Race/Ethnicity   Examiner Name Ilean Skill  Date of Testing 2018/12/28  Age at Testing 15 years 7 months  Retest? No   Comments: Brief Cognitive Status Exam Classification  Age Years of Education Raw Score Classification Level Base Rate  57 years 7 months 12 50 Low Average 22.1    Index Score Summary  Index Sum of Scaled Scores Index Score Percentile Rank 95% Confidence Interval Qualitative Descriptor  Auditory Memory (AMI) 48 112 79  105-118 High Average  Visual Memory (VMI) 45 107 68 101-112 Average  Visual Working Memory (VWMI) 16 88 21 82-96 Low Average  Immediate Memory (IMI) 48 113 81 106-119 High Average  Delayed Memory (DMI) 45 108 70 101-114 Average    Index Score Profile  The vertical bars represent the standard error of measurement (SEM).   Primary Subtest Scaled Score Summary  Subtest Domain Raw Score Scaled Score Percentile Rank  Logical Memory I AM 27 11 63  Logical Memory II AM 22 10 50  Verbal Paired Associates I AM 45 14 91  Verbal Paired Associates II AM 12 13 84  Designs I VM 81 13 84  Designs II VM 66 13 84  Visual Reproduction I VM 34 10 50  Visual Reproduction II VM 20 9 37  Spatial Addition VWM 9  8 25  Symbol Span VWM 17 8 25    Primary Subtest Scaled Score Profile   PROCESS SCORE CONVERSIONS  Auditory Memory Process Score Summary  Process Score Raw Score Scaled Score Percentile Rank Cumulative Percentage (Base Rate)  LM II Recognition 27 - - >75%  VPA II Recognition 38 - - 26-50%   Visual Memory Process Score Summary  Process Score Raw Score Scaled Score Percentile Rank Cumulative Percentage (Base Rate)  DE I Content 43 15 95 -  DE I Spatial 14 8 25  -  DE II Content 42 16 98 -  DE II Spatial 10 9 37 -  DE II Recognition 21 - - >75%  VR II Recognition 7 - - >75%    SUBTEST-LEVEL DIFFERENCES WITHIN INDEXES  Auditory Memory Index  Subtest Scaled Score AMI Mean Score Difference from Mean Critical Value Base Rate  Logical Memory I 11 12.00 -1.00 2.64 >25%  Logical Memory II 10 12.00 -2.00 2.48 25%  Verbal [facility] I 14 12.00 2.00 1.90 25%  Verbal [facility] II 13 12.00 1.00 2.48 >25%  Statistical significance (critical value) at the .05 level.   Visual Memory Index  Subtest Scaled Score VMI Mean Score Difference from Mean Critical Value Base Rate  Designs I 13 11.25 1.75 2.38 >25%  Designs II 13 11.25 1.75 2.38 >25%  Visual Reproduction I 10 11.25  -1.25 1.86 >25%  Visual Reproduction II 9 11.25 -2.25 1.48 25%  Statistical significance (critical value) at the .05 level.   Immediate Memory Index  Subtest Scaled Score IMI Mean Score Difference from Mean Critical Value Base Rate  Logical Memory I 11 12.00 -1.00 2.59 >25%  Verbal [facility] I 14 12.00 2.00 1.82 >25%  Designs I 13 12.00 1.00 2.42 >25%  Visual Reproduction I 10 12.00 -2.00 1.91 >25%  Statistical significance (critical value) at the .05 level.   Delayed Memory Index  Subtest Scaled Score DMI Mean Score Difference from Mean Critical Value Base Rate  Logical Memory II 10 11.25 -1.25 2.44 >25%  Verbal [facility] II 13 11.25 1.75 2.44 >25%  Designs II 13 11.25 1.75 2.44 >25%  Visual Reproduction II 9 11.25 -2.25 1.57 25%  Statistical significance (critical value) at the .05 level.    SUBTEST DISCREPANCY COMPARISON  Comparison Score 1 Score 2 Difference Critical Value Base Rate  Spatial Addition - Symbol Span 8 8 0 2.74 -  Statistical significance (critical value) at the .05 level.    SUBTEST-LEVEL CONTRAST SCALED SCORES  Logical Memory  Score Score 1 Score 2 Contrast Scaled Score  LM II Recognition vs. Delayed Recall >75% 10 8  LM Immediate Recall vs. Delayed Recall 11 10 9    Verbal [facility] Associates  Score Score 1 Score 2 Contrast Scaled Score  VPA II Recognition vs. Delayed Recall 26-50% 13 16  VPA Immediate Recall vs. Delayed Recall 14 13 10    Designs  Score Score 1 Score 2 Contrast Scaled Score  DE I Spatial vs. Content 8 15 18   DE II Spatial vs. Content 9 16 17   DE II Recognition vs. Delayed Recall >75% 13 11  DE Immediate Recall vs. Delayed Recall 13 13 10    Visual Reproduction  Score Score 1 Score 2 Contrast Scaled Score  VR II Recognition vs. Delayed Recall >75% 9 7  VR Immediate Recall vs. Delayed Recall 10 9 8     INDEX-LEVEL CONTRAST SCALED SCORES  WMS-IV Indexes  Score Score 1 Score 2 Contrast Scaled Score  Auditory  Memory Index vs. Visual Memory Index 112 107 11  Visual Working Memory Index vs. Visual Memory Index 88 107 14  Immediate Memory Index vs. Delayed Memory Index 113 108 9    RAW SCORES  Subtest Score Range Adult Score Range Older Adult Raw Score  Brief Cognitive Status Exam 0-58 0-58 50  Logical Memory I 0-50 0-53 27  Logical Memory II 0-50 0-39 22  Verbal Paired Associates I 0-56 0-40 45  Verbal [facility]ired Associates II 0-14 0-10 12  CVLT-II Trials 1-5 5-95 5-95   CVLT-II Long-Delay -5-5 -5-5   Designs I 0-120  81  Designs II 0-120  66  Visual Reproduction I 0-43 0-43 34  Visual Reproduction II 0-43 0-43 20  Spatial Addition 0-24  9  Symbol Span 0-50 0-50 17  Process Score Range Adult Score Range Older Adult Raw Score  LM II Recognition 0-30 0-23 27  VPA II Recognition 0-40 0-30 38  VPA II Word Recall 0-28 0-20   DE I Content 0-48  43  DE I Spatial 0-24  14  DE II Content 0-48  42  DE II Spatial 0-24  10  DE II Recognition 0-24  21  VR II Recognition 0-7 0-7 7  VR II Copy 0-43 0-43     ABILITY-MEMORY ANALYSIS  Ability Score:  GAI: 104 Date of Testing:  WAIS-IV; WMS-IV 2018/12/28  Predicted Difference Method   Index Predicted WMS-IV Index Score Actual WMS-IV Index Score Difference Critical Value  Significant Difference Y/N Base Rate  Auditory Memory 102 112 -10 8.95 Y 20-25%  Visual Memory 102 107 -5 8.82 N   Visual Working Memory 103 88 15 11.24 Y 10%  Immediate Memory 103 113 -10 10.35 N   Delayed Memory 102 108 -6 10.08 N   Statistical significance (critical value) at the .01 level.   Contrast Scaled Scores  Score Score 1 Score 2 Contrast Scaled Score  General Ability Index vs. Auditory Memory Index 104 112 13  General Ability Index vs. Visual Memory Index 104 107 11  General Ability Index vs. Visual Working Memory Index 104 88 7  General Ability Index vs. Immediate Memory Index 104 113 13  General Ability Index vs. Delayed Memory Index 104 108 12    Verbal Comprehension Index vs. Auditory Memory Index 110 112 12  Perceptual Reasoning Index vs. Visual Memory Index 98 107 12  Perceptual Reasoning Index vs. Visual Working Memory Index 98 88 7  Working Memory Index vs. Auditory Memory Index 92 112 14  Working Memory Index vs. Visual Working Memory Index 92 88 8

## 2019-01-11 ENCOUNTER — Encounter: Payer: BLUE CROSS/BLUE SHIELD | Attending: Psychology | Admitting: Psychology

## 2019-01-11 ENCOUNTER — Telehealth: Payer: Self-pay

## 2019-01-11 ENCOUNTER — Encounter: Payer: Self-pay | Admitting: Psychology

## 2019-01-11 ENCOUNTER — Other Ambulatory Visit: Payer: Self-pay

## 2019-01-11 DIAGNOSIS — R4189 Other symptoms and signs involving cognitive functions and awareness: Secondary | ICD-10-CM | POA: Insufficient documentation

## 2019-01-11 DIAGNOSIS — R29818 Other symptoms and signs involving the nervous system: Secondary | ICD-10-CM | POA: Diagnosis not present

## 2019-01-11 DIAGNOSIS — R413 Other amnesia: Secondary | ICD-10-CM | POA: Insufficient documentation

## 2019-01-11 NOTE — Telephone Encounter (Signed)
I reviewed those.  Let's give her a return to work note.  They recommended that she work 4 hours per day for the first 5 days back and if that goes well can then return to regular work hours.

## 2019-01-11 NOTE — Telephone Encounter (Signed)
Please see message. Please advise.  Copied from [place] [id number]. Topic: General - Other >> Jan 10, 2019 12:27 PM Burchel, Abbi R wrote: Pt states she may need to extend her FMLA due to her pending results from Neurology.  Please call pt to discuss.   Pt asked to please note she has a virtual appt with Neuro today at 1pm and will be unavailable from 1pm-2pm.  Please call at a different time.   [phone] >> Jan 11, 2019 10:29 AM Ahmed Prima L wrote: Patient states she just had tele visit with her neuro doctor and he recommends she can go back to work tomorrow. He is releasing the results of her evaluation to mychart and would like Dr Elease Hashimoto to view those. She can be reached at [phone]

## 2019-01-11 NOTE — Progress Notes (Signed)
Today I provided feedback to the patient over WebEx utilizing video and audio services.  The appointment lasted for 1-1/2 hours.  We fully reviewed the results of the current neuropsychological evaluation with recommendations and have made this evaluation available to the patient to be able to see the report through my chart.  We have set a strategy for the patient to return to work with initial 5 working days to be done at 4 hours each day and once that is completed and goes well the patient should be able to return to her full-time regular schedule.  Below you will find the summary and interpretations from the full report that you can find in his complete state for the visit dated 01/10/2019.   Summary of Results:                        Overall, the results of the current neuropsychological evaluation are consistent with 2 variables.  The patient is showing some deficits with regard to left frontal brain regions particularly with motor strength and fine motor functioning of the left frontal regions.  The patient is also showing some weaknesses and deficits relative to working memory which is also considered to be mediated by the left superior frontal gyrus.  The patient also showed deficits with regard to overall information processing speed and speed of mental operations.  This was identified for visual scanning and visual visual searching abilities which are going to be heavily influenced by the patient's visual working memory.  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  Other areas of cognitive functioning were all generally within the average to high average range with no other deficits identified beyond visual working memory and to a lesser degree auditory working memory and overall information processing speed.  The patient did quite well with regard to verbal comprehension  including verbal reasoning and problem-solving, social judgment, vocabulary abilities as well as visual spatial and visual analysis abilities.  The patient showed no intermediate or long-term memory deficits for either visual or auditory information.  While this brain region that is affected can also have an impact on intrusive thinking and obsessive-compulsive types of issues associated with anxiety, patient also reports an increase in these types of symptoms.  It is likely that the executive functioning issues are heavily influenced by her deficits with regard to working memory and ability to maintain focus and attention when trying to use her reasoning and problem-solving abilities.  Impression/Diagnosis:                     Overall, the results of the current neuropsychological evaluation do show strengths and weaknesses that would be very consistent with the MRI showing left superior frontal gyrus.  The pattern of strengths and weaknesses have a heavy loading on the left frontal brain region including the patient's reported changes in expressive language/fluency, problems with working memory, executive functioning deficits and changes in fine motor control and muscle strength consistent with left frontal changes.  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  However, the patient and her daughter both report that these have been relatively acute changes and is still to be determined whether the patient suffered a stroke in March 2019 versus suggestions of a low-grade neoplasm in  this left superior frontal gyrus.  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  I will provide feedback to the patient and her daughter regarding the results of the current  neuropsychological evaluation and will set out specific recommendations for her to try to adjust to the current cognitive deficits as well as dealing with the patient's other medical issues more broadly.  I do think with the adjustments the patient has been able to make and recommendations for accommodations to the deficits identified that the patient is able to return to work.  I would recommend that the patient initially return at 4 hours work schedule each day for the first 5 working days to keep stress down and allow for adjustment to the work environment.  Once the 5 half days are completed and go well, the patient will be able to go back to a regular schedule.    Diagnosis:                               Axis I: Neurocognitive deficits   Ilean Skill, Psy.D. Neuropsychologist

## 2019-01-11 NOTE — Telephone Encounter (Signed)
Called patient and let her know that I have sent a work note for her through her MyChart and she will attach the updated FMLA forms to a new MyChart message so we can receive or she will bring them to [place].

## 2019-01-19 ENCOUNTER — Ambulatory Visit: Payer: BLUE CROSS/BLUE SHIELD | Admitting: Neurology

## 2019-01-27 DIAGNOSIS — D0322 Melanoma in situ of left ear and external auricular canal: Secondary | ICD-10-CM | POA: Diagnosis not present

## 2019-01-27 DIAGNOSIS — L989 Disorder of the skin and subcutaneous tissue, unspecified: Secondary | ICD-10-CM | POA: Diagnosis not present

## 2019-01-27 DIAGNOSIS — L905 Scar conditions and fibrosis of skin: Secondary | ICD-10-CM | POA: Diagnosis not present

## 2019-01-29 ENCOUNTER — Other Ambulatory Visit: Payer: Self-pay | Admitting: Family Medicine

## 2019-02-04 ENCOUNTER — Inpatient Hospital Stay: Payer: BLUE CROSS/BLUE SHIELD | Attending: Hematology & Oncology | Admitting: Hematology & Oncology

## 2019-02-04 ENCOUNTER — Encounter: Payer: Self-pay | Admitting: Hematology & Oncology

## 2019-02-04 ENCOUNTER — Inpatient Hospital Stay: Payer: BLUE CROSS/BLUE SHIELD

## 2019-02-04 ENCOUNTER — Other Ambulatory Visit: Payer: Self-pay

## 2019-02-04 VITALS — BP 126/65 | HR 66 | Temp 98.6°F | Resp 20 | Wt 220.1 lb

## 2019-02-04 DIAGNOSIS — Z7901 Long term (current) use of anticoagulants: Secondary | ICD-10-CM | POA: Diagnosis not present

## 2019-02-04 DIAGNOSIS — C4321 Malignant melanoma of right ear and external auricular canal: Secondary | ICD-10-CM | POA: Diagnosis not present

## 2019-02-04 DIAGNOSIS — I639 Cerebral infarction, unspecified: Secondary | ICD-10-CM

## 2019-02-04 DIAGNOSIS — Z79899 Other long term (current) drug therapy: Secondary | ICD-10-CM

## 2019-02-04 DIAGNOSIS — D509 Iron deficiency anemia, unspecified: Secondary | ICD-10-CM | POA: Diagnosis not present

## 2019-02-04 DIAGNOSIS — Z86711 Personal history of pulmonary embolism: Secondary | ICD-10-CM

## 2019-02-04 DIAGNOSIS — C4322 Malignant melanoma of left ear and external auricular canal: Secondary | ICD-10-CM | POA: Diagnosis not present

## 2019-02-04 DIAGNOSIS — D508 Other iron deficiency anemias: Secondary | ICD-10-CM

## 2019-02-04 DIAGNOSIS — Z8673 Personal history of transient ischemic attack (TIA), and cerebral infarction without residual deficits: Secondary | ICD-10-CM

## 2019-02-04 LAB — IRON AND TIBC
Iron: 77 ug/dL (ref 41–142)
Saturation Ratios: 27 % (ref 21–57)
TIBC: 280 ug/dL (ref 236–444)
UIBC: 203 ug/dL (ref 120–384)

## 2019-02-04 LAB — CMP (CANCER CENTER ONLY)
ALT: 67 U/L — ABNORMAL HIGH (ref 0–44)
AST: 37 U/L (ref 15–41)
Albumin: 3.9 g/dL (ref 3.5–5.0)
Alkaline Phosphatase: 103 U/L (ref 38–126)
Anion gap: 8 (ref 5–15)
BUN: 14 mg/dL (ref 6–20)
CO2: 27 mmol/L (ref 22–32)
Calcium: 9.3 mg/dL (ref 8.9–10.3)
Chloride: 106 mmol/L (ref 98–111)
Creatinine: 0.76 mg/dL (ref 0.44–1.00)
GFR, Est AFR Am: >60 mL/min (ref >60)
GFR, Estimated: >60 mL/min (ref >60)
Glucose, Bld: 114 mg/dL — ABNORMAL HIGH (ref 70–99)
Potassium: 4.1 mmol/L (ref 3.5–5.1)
Sodium: 141 mmol/L (ref 135–145)
Total Bilirubin: 0.4 mg/dL (ref 0.3–1.2)
Total Protein: 6.5 g/dL (ref 6.5–8.1)

## 2019-02-04 LAB — CBC WITH DIFFERENTIAL (CANCER CENTER ONLY)
Abs Immature Granulocytes: 0.01 10*3/uL (ref 0.00–0.07)
Basophils Absolute: 0.1 10*3/uL (ref 0.0–0.1)
Basophils Relative: 1 %
Eosinophils Absolute: 0.2 10*3/uL (ref 0.0–0.5)
Eosinophils Relative: 2 %
HCT: 42.1 % (ref 36.0–46.0)
Hemoglobin: 14.3 g/dL (ref 12.0–15.0)
Immature Granulocytes: 0 %
Lymphocytes Relative: 36 %
Lymphs Abs: 2.5 10*3/uL (ref 0.7–4.0)
MCH: 30.9 pg (ref 26.0–34.0)
MCHC: 34 g/dL (ref 30.0–36.0)
MCV: 90.9 fL (ref 80.0–100.0)
Monocytes Absolute: 0.6 10*3/uL (ref 0.1–1.0)
Monocytes Relative: 8 %
Neutro Abs: 3.7 10*3/uL (ref 1.7–7.7)
Neutrophils Relative %: 53 %
Platelet Count: 280 10*3/uL (ref 150–400)
RBC: 4.63 MIL/uL (ref 3.87–5.11)
RDW: 13.5 % (ref 11.5–15.5)
WBC Count: 7 10*3/uL (ref 4.0–10.5)
nRBC: 0 % (ref 0.0–0.2)

## 2019-02-04 LAB — FERRITIN: Ferritin: 82 ng/mL (ref 11–307)

## 2019-02-04 NOTE — Progress Notes (Signed)
Hematology and Oncology Follow Up Visit  Kimberly Monroe [id number] 1961/05/12 58 y.o. 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  Surprisingly enough, she had surgery on her left earlobe about 9 days ago.  She had a melanoma that was removed.  We are trying to get the pathology report to see if this was a invasive melanoma or if this was an in situ melanoma.  She had a skin graft placed.  The skin came from the left upper chest wall.  Otherwise she is doing well.  She does have family up in [place].  She is worried about them with the coronavirus.  She is doing well on the Xarelto.  She has had no problems with recurrent thromboembolic disease.  There is been no bleeding.  She has had no fever.  She has had no cough.  There is been no nausea or vomiting.  Overall, her performance status is ECOG 1.   Medications:  Allergies as of 02/04/2019      Reactions   Lime Flavor [flavoring Agent] Anaphylaxis   Amoxicillin Other (See Comments)   Patient stated,"my mom told me to never take it. I don't even know what type of reaction."   Penicillins Other (See Comments)   Did it involve swelling of the face/tongue/throat, SOB, or low BP? Unknown Did it involve sudden or severe rash/hives, skin peeling, or any reaction on the inside of your mouth or nose? Unknown Did you need to seek medical attention at a hospital or doctor's office? Unknown When did it last happen? Childhood allergy If all above answers are "NO", may proceed with cephalosporin use. Patient stated,"I was told not to take it."      Medication List       Accurate as of Feb 04, 2019  8:11 AM. Always use your most recent med list.        amLODipine 2.5 MG tablet Commonly known as:  NORVASC TAKE 1  TABLET BY MOUTH EVERY DAY   atorvastatin 20 MG tablet Commonly known as:  LIPITOR TAKE 1 TABLET BY MOUTH EVERY DAY   doxycycline 100 MG tablet Commonly known as:  VIBRA-TABS 100 mg.   lamoTRIgine 25 MG tablet Commonly known as:  LAMICTAL 1 tablet twice a day for the first week 2 tablets twice a day for the second week 3 tablets twice a day for the third week 4 tablets twice a day for the fourth week  For total of 140 tablets  After finish titration with small dose of lamotrigine 25 mg, change to lamotrigine 100 mg twice a day   lamoTRIgine 100 MG tablet Commonly known as:  LAMICTAL Take 1 tablet (100 mg total) by mouth 2 (two) times daily.   lisinopril 10 MG tablet Commonly known as:  ZESTRIL TAKE 1 TABLET (10 MG TOTAL) BY MOUTH DAILY.   Vitamin D (Ergocalciferol) 1.25 MG (50000 UT) Caps capsule Commonly known as:  DRISDOL TAKE ONE CAPSULE BY MOUTH WEEKLY AS ONE DOSE   Xarelto 20 MG Tabs tablet Generic drug:  rivaroxaban TAKE 1 TABLET (20 MG TOTAL) BY MOUTH DAILY WITH SUPPER.       Allergies:  Allergies  Allergen Reactions  . Lime Flavor [Flavoring Agent] Anaphylaxis  . Amoxicillin Other (See Comments)  Patient stated,"my mom told me to never take it. I don't even know what type of reaction."  . Penicillins Other (See Comments)    Did it involve swelling of the face/tongue/throat, SOB, or low BP? Unknown Did it involve sudden or severe rash/hives, skin peeling, or any reaction on the inside of your mouth or nose? Unknown Did you need to seek medical attention at a hospital or doctor's office? Unknown When did it last happen? Childhood allergy If all above answers are "NO", may proceed with cephalosporin use.   Patient stated,"I was told not to take it."     Past Medical History, Surgical history, Social history, and Family History were reviewed and updated.  Review of Systems: Review of Systems  Constitutional: Negative.   Eyes: Negative.    Respiratory: Negative.   Cardiovascular: Negative.   Gastrointestinal: Negative.   Genitourinary: Negative.   Musculoskeletal: Negative.   Skin: Negative.   Neurological: Negative.   Endo/Heme/Allergies: Negative.   Psychiatric/Behavioral: Negative.      Physical Exam:  weight is 220 lb 1.9 oz (99.8 kg). Her oral temperature is 98.6 F (37 C). Her blood pressure is 126/65 and her pulse is 66. Her respiration is 20 and oxygen saturation is 99%.   Wt Readings from Last 3 Encounters:  02/04/19 220 lb 1.9 oz (99.8 kg)  12/07/18 210 lb 8 oz (95.5 kg)  11/26/18 210 lb 4.8 oz (95.4 kg)    Physical Exam Vitals signs reviewed.  HENT:     Head: Normocephalic and atraumatic.     Ears:     Comments: She has a dressing on the left earlobe.  There is no adenopathy in the neck. Eyes:     Pupils: Pupils are equal, round, and reactive to light.  Neck:     Musculoskeletal: Normal range of motion.     Comments: She has a dressing in the left supraclavicular region where the skin graft was taken. Cardiovascular:     Rate and Rhythm: Normal rate and regular rhythm.     Heart sounds: Normal heart sounds.  Pulmonary:     Effort: Pulmonary effort is normal.     Breath sounds: Normal breath sounds.  Abdominal:     General: Bowel sounds are normal.     Palpations: Abdomen is soft.  Musculoskeletal: Normal range of motion.        General: No tenderness or deformity.  Lymphadenopathy:     Cervical: No cervical adenopathy.  Skin:    General: Skin is warm and dry.     Findings: No erythema or rash.  Neurological:     Mental Status: She is alert and oriented to person, place, and time.  Psychiatric:        Behavior: Behavior normal.        Thought Content: Thought content normal.        Judgment: Judgment normal.      Lab Results  Component Value Date   WBC 7.0 02/04/2019   HGB 14.3 02/04/2019   HCT 42.1 02/04/2019   MCV 90.9 02/04/2019   PLT 280 02/04/2019   Lab Results   Component Value Date   FERRITIN 94 06/10/2018   IRON 101 06/10/2018   TIBC 299 06/10/2018   UIBC 198 06/10/2018   IRONPCTSAT 34 06/10/2018   Lab Results  Component Value Date   RETICCTPCT 1.5 09/06/2012   RBC 4.63 02/04/2019   RETICCTABS 67.7 09/06/2012   No results found for: KPAFRELGTCHN, LAMBDASER, KAPLAMBRATIO No results found  for: Kandis Cocking, IGMSERUM No results found for: Odetta Pink, SPEI   Chemistry      Component Value Date/Time   NA 140 11/09/2018 1617   NA 141 07/23/2016 0956   K 4.3 11/09/2018 1617   K 4.2 07/23/2016 0956   CL 105 11/09/2018 1617   CO2 24 11/09/2018 1617   CO2 25 07/23/2016 0956   BUN 19 11/09/2018 1617   BUN 14.6 07/23/2016 0956   CREATININE 0.74 11/09/2018 1617   CREATININE 0.88 10/08/2018 0829   CREATININE 0.7 07/23/2016 0956      Component Value Date/Time   CALCIUM 9.5 11/09/2018 1617   CALCIUM 9.3 07/23/2016 0956   ALKPHOS 72 11/04/2018 0841   ALKPHOS 97 07/23/2016 0956   AST 29 11/04/2018 0841   AST 20 10/08/2018 0829   AST 25 07/23/2016 0956   ALT 33 11/04/2018 0841   ALT 39 10/08/2018 0829   ALT 27 07/23/2016 0956   BILITOT 0.9 11/04/2018 0841   BILITOT 0.4 10/08/2018 0829   BILITOT 0.30 07/23/2016 0956       Impression and Plan: Kimberly Monroe is a very pleasant 58 yo caucasian female with history of idiopathic PE which has since resolved.   She then had a CVA in April 2019.  Again, we really have to try to get the pathology report on the melanoma.  If this is an invasive melanoma, we had to find out what the [place] depth is.  She might need some additional testing.  She is doing well on the Xarelto.  I am not worried about recurrent thromboembolic disease.  We will plan to get her back in another 6 months unless we find some new fax regarding the melanoma.    Volanda Napoleon, MD 5/1/20208:11 AM

## 2019-02-12 ENCOUNTER — Other Ambulatory Visit: Payer: Self-pay | Admitting: Family Medicine

## 2019-02-14 NOTE — Telephone Encounter (Signed)
I only see Lisinopril 10 mg in her medication list, not 20 mg.  Please advise.

## 2019-02-15 ENCOUNTER — Other Ambulatory Visit: Payer: Self-pay | Admitting: Family Medicine

## 2019-02-15 NOTE — Telephone Encounter (Signed)
She should be on 10 mg- but confirm with patient.  OK to refill for one year.

## 2019-03-01 ENCOUNTER — Ambulatory Visit: Payer: Self-pay | Admitting: Psychology

## 2019-03-03 ENCOUNTER — Ambulatory Visit: Payer: Self-pay | Admitting: Psychology

## 2019-03-11 DIAGNOSIS — R404 Transient alteration of awareness: Secondary | ICD-10-CM | POA: Diagnosis not present

## 2019-03-11 DIAGNOSIS — F329 Major depressive disorder, single episode, unspecified: Secondary | ICD-10-CM | POA: Diagnosis not present

## 2019-03-11 DIAGNOSIS — F419 Anxiety disorder, unspecified: Secondary | ICD-10-CM | POA: Diagnosis not present

## 2019-03-11 DIAGNOSIS — R569 Unspecified convulsions: Secondary | ICD-10-CM | POA: Diagnosis not present

## 2019-03-11 DIAGNOSIS — R42 Dizziness and giddiness: Secondary | ICD-10-CM | POA: Diagnosis not present

## 2019-04-11 ENCOUNTER — Encounter: Payer: Self-pay | Admitting: Family Medicine

## 2019-04-11 DIAGNOSIS — R2990 Unspecified symptoms and signs involving the nervous system: Secondary | ICD-10-CM | POA: Diagnosis not present

## 2019-04-11 DIAGNOSIS — R404 Transient alteration of awareness: Secondary | ICD-10-CM | POA: Diagnosis not present

## 2019-04-13 ENCOUNTER — Other Ambulatory Visit: Payer: Self-pay

## 2019-04-13 DIAGNOSIS — R5383 Other fatigue: Secondary | ICD-10-CM

## 2019-04-15 ENCOUNTER — Other Ambulatory Visit: Payer: Self-pay

## 2019-04-15 ENCOUNTER — Other Ambulatory Visit (INDEPENDENT_AMBULATORY_CARE_PROVIDER_SITE_OTHER): Payer: BC Managed Care – PPO

## 2019-04-15 DIAGNOSIS — R5383 Other fatigue: Secondary | ICD-10-CM

## 2019-04-15 LAB — TSH: TSH: 2.28 u[IU]/mL (ref 0.35–4.50)

## 2019-04-15 LAB — VITAMIN B12: Vitamin B-12: 214 pg/mL (ref 211–911)

## 2019-04-15 LAB — T4, FREE: Free T4: 0.71 ng/dL (ref 0.60–1.60)

## 2019-04-28 DIAGNOSIS — R404 Transient alteration of awareness: Secondary | ICD-10-CM | POA: Diagnosis not present

## 2019-04-28 DIAGNOSIS — R9401 Abnormal electroencephalogram [EEG]: Secondary | ICD-10-CM | POA: Diagnosis not present

## 2019-05-30 ENCOUNTER — Other Ambulatory Visit: Payer: Self-pay | Admitting: Surgery

## 2019-05-30 DIAGNOSIS — R404 Transient alteration of awareness: Secondary | ICD-10-CM

## 2019-06-02 ENCOUNTER — Encounter: Payer: Self-pay | Admitting: Family Medicine

## 2019-06-03 MED ORDER — LORAZEPAM 1 MG PO TABS: ORAL_TABLET | ORAL | 1 refills | Status: DC

## 2019-06-03 NOTE — Telephone Encounter (Signed)
I sent in to pharmacy Lorazepam to take one hour prior to procedure.  Get any FMLA papers to [place] and will be happy to try to fill out.  We may need to do Doxy/virtual follow up to get further details for FMLA update.

## 2019-06-08 ENCOUNTER — Other Ambulatory Visit: Payer: Self-pay

## 2019-06-08 ENCOUNTER — Ambulatory Visit
Admission: RE | Admit: 2019-06-08 | Discharge: 2019-06-08 | Disposition: A | Discharge status: Home / Self Care | Payer: BC Managed Care – PPO | Source: Ambulatory Visit | Attending: Surgery | Admitting: Surgery

## 2019-06-08 ENCOUNTER — Encounter: Payer: Self-pay | Admitting: Family Medicine

## 2019-06-08 DIAGNOSIS — R4789 Other speech disturbances: Secondary | ICD-10-CM | POA: Diagnosis not present

## 2019-06-08 DIAGNOSIS — R404 Transient alteration of awareness: Secondary | ICD-10-CM

## 2019-06-08 DIAGNOSIS — R51 Headache: Secondary | ICD-10-CM | POA: Diagnosis not present

## 2019-06-08 MED ORDER — GADOBENATE DIMEGLUMINE 529 MG/ML IV SOLN
20.0000 mL | Freq: Once | INTRAVENOUS | Status: AC | PRN
  Administered 2019-06-08: 20 mL via INTRAVENOUS

## 2019-06-23 DIAGNOSIS — Z7901 Long term (current) use of anticoagulants: Secondary | ICD-10-CM | POA: Diagnosis not present

## 2019-06-23 DIAGNOSIS — R2990 Unspecified symptoms and signs involving the nervous system: Secondary | ICD-10-CM | POA: Diagnosis not present

## 2019-06-23 DIAGNOSIS — R404 Transient alteration of awareness: Secondary | ICD-10-CM | POA: Diagnosis not present

## 2019-06-23 DIAGNOSIS — Z79899 Other long term (current) drug therapy: Secondary | ICD-10-CM | POA: Diagnosis not present

## 2019-06-24 ENCOUNTER — Telehealth: Payer: Self-pay | Admitting: Family Medicine

## 2019-06-24 NOTE — Telephone Encounter (Signed)
Pt came in and dropped off FMLA forms with a note attached to be completed by the provider.  Upon completion pt would like to have a call at [phone] so that she can pick it up.  Form placed in the providers folder for completion.

## 2019-06-27 ENCOUNTER — Encounter: Payer: Self-pay | Admitting: Family Medicine

## 2019-06-27 NOTE — Telephone Encounter (Signed)
Paperwork placed in red folder. Please return when completed. Thank you!

## 2019-07-05 NOTE — Telephone Encounter (Signed)
Will send to Dr Burchette as FYI.  

## 2019-07-06 NOTE — Telephone Encounter (Signed)
Patient picked up forms  No form fee

## 2019-07-18 ENCOUNTER — Other Ambulatory Visit: Payer: Self-pay | Admitting: *Deleted

## 2019-07-18 DIAGNOSIS — E559 Vitamin D deficiency, unspecified: Secondary | ICD-10-CM

## 2019-07-18 MED ORDER — VITAMIN D (ERGOCALCIFEROL) 1.25 MG (50000 UNIT) PO CAPS: ORAL_CAPSULE | ORAL | 2 refills | Status: DC

## 2019-07-21 DIAGNOSIS — D1801 Hemangioma of skin and subcutaneous tissue: Secondary | ICD-10-CM | POA: Diagnosis not present

## 2019-07-21 DIAGNOSIS — L821 Other seborrheic keratosis: Secondary | ICD-10-CM | POA: Diagnosis not present

## 2019-07-21 DIAGNOSIS — L304 Erythema intertrigo: Secondary | ICD-10-CM | POA: Diagnosis not present

## 2019-07-21 DIAGNOSIS — D2271 Melanocytic nevi of right lower limb, including hip: Secondary | ICD-10-CM | POA: Diagnosis not present

## 2019-07-21 DIAGNOSIS — D225 Melanocytic nevi of trunk: Secondary | ICD-10-CM | POA: Diagnosis not present

## 2019-07-21 DIAGNOSIS — D485 Neoplasm of uncertain behavior of skin: Secondary | ICD-10-CM | POA: Diagnosis not present

## 2019-07-21 DIAGNOSIS — L570 Actinic keratosis: Secondary | ICD-10-CM | POA: Diagnosis not present

## 2019-07-21 DIAGNOSIS — L820 Inflamed seborrheic keratosis: Secondary | ICD-10-CM | POA: Diagnosis not present

## 2019-07-22 ENCOUNTER — Other Ambulatory Visit: Payer: Self-pay | Admitting: Family Medicine

## 2019-07-27 DIAGNOSIS — Z20828 Contact with and (suspected) exposure to other viral communicable diseases: Secondary | ICD-10-CM | POA: Diagnosis not present

## 2019-07-27 DIAGNOSIS — Z01812 Encounter for preprocedural laboratory examination: Secondary | ICD-10-CM | POA: Diagnosis not present

## 2019-07-29 DIAGNOSIS — F419 Anxiety disorder, unspecified: Secondary | ICD-10-CM | POA: Diagnosis not present

## 2019-07-29 DIAGNOSIS — F329 Major depressive disorder, single episode, unspecified: Secondary | ICD-10-CM | POA: Diagnosis not present

## 2019-07-29 DIAGNOSIS — R9401 Abnormal electroencephalogram [EEG]: Secondary | ICD-10-CM | POA: Diagnosis not present

## 2019-07-29 DIAGNOSIS — Z880 Allergy status to penicillin: Secondary | ICD-10-CM | POA: Diagnosis not present

## 2019-07-29 DIAGNOSIS — R404 Transient alteration of awareness: Secondary | ICD-10-CM | POA: Diagnosis not present

## 2019-07-29 DIAGNOSIS — Z86000 Personal history of in-situ neoplasm of breast: Secondary | ICD-10-CM | POA: Diagnosis not present

## 2019-07-29 DIAGNOSIS — R448 Other symptoms and signs involving general sensations and perceptions: Secondary | ICD-10-CM | POA: Diagnosis not present

## 2019-07-29 DIAGNOSIS — Z85828 Personal history of other malignant neoplasm of skin: Secondary | ICD-10-CM | POA: Diagnosis not present

## 2019-07-29 DIAGNOSIS — Z8673 Personal history of transient ischemic attack (TIA), and cerebral infarction without residual deficits: Secondary | ICD-10-CM | POA: Diagnosis not present

## 2019-07-29 DIAGNOSIS — J302 Other seasonal allergic rhinitis: Secondary | ICD-10-CM | POA: Diagnosis not present

## 2019-07-29 DIAGNOSIS — D6861 Antiphospholipid syndrome: Secondary | ICD-10-CM | POA: Diagnosis not present

## 2019-07-29 DIAGNOSIS — Z7901 Long term (current) use of anticoagulants: Secondary | ICD-10-CM | POA: Diagnosis not present

## 2019-07-29 DIAGNOSIS — R569 Unspecified convulsions: Secondary | ICD-10-CM | POA: Diagnosis not present

## 2019-07-29 DIAGNOSIS — R760 Raised antibody titer: Secondary | ICD-10-CM | POA: Diagnosis not present

## 2019-07-29 DIAGNOSIS — Z86711 Personal history of pulmonary embolism: Secondary | ICD-10-CM | POA: Diagnosis not present

## 2019-08-04 MED ORDER — ATORVASTATIN CALCIUM 20 MG PO TABS
20.00 | ORAL_TABLET | ORAL | Status: DC
Start: 2019-08-04 — End: 2019-08-04

## 2019-08-04 MED ORDER — ONDANSETRON 4 MG PO TBDP
4.00 | ORAL_TABLET | ORAL | Status: DC
Start: ? — End: 2019-08-04

## 2019-08-04 MED ORDER — LORAZEPAM 2 MG/ML IJ SOLN
1.00 | INTRAMUSCULAR | Status: DC
Start: ? — End: 2019-08-04

## 2019-08-04 MED ORDER — LISINOPRIL 10 MG PO TABS
10.00 | ORAL_TABLET | ORAL | Status: DC
Start: 2019-08-04 — End: 2019-08-04

## 2019-08-04 MED ORDER — RIVAROXABAN 20 MG PO TABS
20.00 | ORAL_TABLET | ORAL | Status: DC
Start: 2019-08-04 — End: 2019-08-04

## 2019-08-04 MED ORDER — ACETAMINOPHEN 325 MG PO TABS
650.00 | ORAL_TABLET | ORAL | Status: DC
Start: ? — End: 2019-08-04

## 2019-08-08 ENCOUNTER — Inpatient Hospital Stay: Payer: BC Managed Care – PPO | Attending: Hematology & Oncology | Admitting: Hematology & Oncology

## 2019-08-08 ENCOUNTER — Inpatient Hospital Stay: Payer: BC Managed Care – PPO

## 2019-08-18 ENCOUNTER — Other Ambulatory Visit: Payer: Self-pay | Admitting: Family Medicine

## 2019-08-18 ENCOUNTER — Telehealth: Payer: Self-pay

## 2019-08-18 NOTE — Telephone Encounter (Signed)
Patient left voicemail that was transferred to me - asks that you give her a call please.

## 2019-08-23 DIAGNOSIS — R4189 Other symptoms and signs involving cognitive functions and awareness: Secondary | ICD-10-CM | POA: Diagnosis not present

## 2019-08-23 DIAGNOSIS — R2990 Unspecified symptoms and signs involving the nervous system: Secondary | ICD-10-CM | POA: Diagnosis not present

## 2019-08-31 ENCOUNTER — Encounter: Payer: Self-pay | Admitting: Family Medicine

## 2019-08-31 MED ORDER — LORAZEPAM 1 MG PO TABS: ORAL_TABLET | ORAL | 0 refills | Status: DC

## 2019-08-31 NOTE — Telephone Encounter (Signed)
We will send in lorazepam 1 mg take 1 hour prior to procedure.  Let her know I sent prescription in to [place]

## 2019-09-05 DIAGNOSIS — R9089 Other abnormal findings on diagnostic imaging of central nervous system: Secondary | ICD-10-CM | POA: Diagnosis not present

## 2019-09-09 ENCOUNTER — Telehealth: Payer: Self-pay

## 2019-09-09 NOTE — Telephone Encounter (Signed)
Patient called and extremely upset because [facility] sent her a MyChart message that stated that she needed to see neurosurgery for brain surgery and she was really upset because no one called her and she is not able to speak to anyone and not sure what is going on. I let her know that I will check our faxes as I do not see the message from [facility] and I will send to Dr. Elease Hashimoto to see if he is aware of the MRI/CT results.

## 2019-09-09 NOTE — Telephone Encounter (Signed)
Patient called again and is still very upset about receiving the message from earlier. I advised for patient to call [facility] and ask to speak to someone in management if she can to discuss this matter. I asked her to keep [place] informed and have them fax [place] the results. Patient verbalized an understanding.

## 2019-09-11 NOTE — Telephone Encounter (Signed)
I tried to call her on Friday but got no answer   Hopefully she has been able to get in touch with her providers in [place].

## 2019-09-14 NOTE — Telephone Encounter (Signed)
Called patient and she was much calmer today. Patient stated that she did the brain scan and there is something there and the [facility] team has been sending her messages through her MyChart and she has not received a call to have someone go over the results. Patient did state that she has a virtual appointment with a neurosurgeon tomorrow. I advised for patient to write down her questions so she can get a better explanation from the doctor. Patient verbalized an understanding.

## 2019-09-21 ENCOUNTER — Encounter: Payer: Self-pay | Admitting: *Deleted

## 2019-09-21 NOTE — Progress Notes (Signed)
I faxed paperwork to [facility] at [phone]. Patient is scheduled for a left frontal craniotomy for tumor resection on 10/04/2019. Per Dr. Marin Olp, HOLD Xarelto for five days.

## 2019-09-23 ENCOUNTER — Other Ambulatory Visit: Payer: Self-pay

## 2019-09-23 ENCOUNTER — Encounter: Payer: Self-pay | Admitting: Family Medicine

## 2019-09-23 ENCOUNTER — Encounter: Payer: BC Managed Care – PPO | Attending: Psychology | Admitting: Psychology

## 2019-09-23 ENCOUNTER — Encounter

## 2019-09-23 ENCOUNTER — Encounter: Payer: Self-pay | Admitting: Psychology

## 2019-09-23 DIAGNOSIS — R9089 Other abnormal findings on diagnostic imaging of central nervous system: Secondary | ICD-10-CM | POA: Insufficient documentation

## 2019-09-23 DIAGNOSIS — R413 Other amnesia: Secondary | ICD-10-CM | POA: Diagnosis not present

## 2019-09-23 DIAGNOSIS — F331 Major depressive disorder, recurrent, moderate: Secondary | ICD-10-CM | POA: Diagnosis not present

## 2019-09-23 DIAGNOSIS — R29818 Other symptoms and signs involving the nervous system: Secondary | ICD-10-CM | POA: Diagnosis not present

## 2019-09-23 DIAGNOSIS — R4189 Other symptoms and signs involving cognitive functions and awareness: Secondary | ICD-10-CM | POA: Insufficient documentation

## 2019-09-23 NOTE — Progress Notes (Signed)
Neuropsychological Consultation   Patient:   Kimberly Monroe   DOB:   1960/10/29  MR Number:  [id number]  Location:  [facility] [place], [place] [id number] [facility] [address] [place] [address] Dept: [phone]           Date of Service:   09/23/2019  Start Time:   9 AM End Time:   10 AM  Today's visit was an in person visit.  It is a follow-up reassessment visit from our neuropsychological evaluation that was done in the spring.  There have been some significant changes with the patient going forward and she has now been scheduled for craniotomy on 10/04/2019.  Provider/Observer:  Ilean Skill, Psy.D.       Clinical Neuropsychologist       Billing Code/Service: Neurobehavioral status exam  Reason for Service:  Kimberly Monroe is a 58 year old female initially referred by Dr. Elease Hashimoto for neuropsychological evaluation and consultation.  The patient had a significant event prior while at work.  This occurred in March 2019.  The patient reports that she has been dealing with very high blood pressure and began having acute symptoms of vision loss and confusion.  The patient was taken from her office to the emergency department at [facility].  She was initially diagnosed with vertigo.  Follow-up 1 month later at [facility] neurology they diagnosed her with stroke.  She had abnormal MRI with indications of chronic small vessel disease as well as a 1 cm area of signal abnormality in the left superior frontal gyrus that was at the time felt to be subacute to chronic infarction.  Follow-up MRI conducted 11/04/2018 after a syncopal event with dizziness and other persistent cognitive difficulties showed unchanged 1.1 cm T2 hyperintensity lesion in the left superior frontal gyrus.  While it was felt to be indeterminate as to specific etiological factors the stability from the MRI on 01/2018 was felt  to exclude infarct as an etiology.  One consideration was a low-grade neoplasm as a possibility and follow-up was warranted.  It was also continued to be indicative of mild chronic small vessel ischemic disease.  The patient has a history of melanoma, squamous cell carcinoma of skin, basal cell carcinoma, pulmonary embolism, CVA/TIA, as well as depression and anxiety.  During the interview today, the patient reports that last year she had a stroke and occurred in March 2019.  However, we are not sure about whether there is been any type of acute subacute infarction.  The patient reports that she has had significant elevated blood pressure.  The patient was taken by ambulance from her place of work to the emergency department in [facility] and was initially diagnosed with vertigo.  Follow-up with [facility] neurology initially felt that her MRI was indicative of it stroke.  This MRI was 1 month after the event in March.  More recent MRI has called into question the potential etiological factors related to the 1.1 cm lesion identified in the left superior frontal gyrus.  However, there is clear indications of mild chronic small vessel ischemic disease and the patient does have a history of pulmonary and cardiovascular issues.  The patient has had significant elevated blood pressure readings that are being addressed cardiologically.  The patient reports that she has had significant cognitive and memory symptoms that she and her daughter both report really started developing in March 2019.  The patient reports that she  has had difficulty with information processing speed as well as just general executive functioning and processing of information.  The patient describes memory difficulties and short-term memory difficulties which by the descriptions of the patient appear to be more retrieval deficits then then an inability to store or organize information.  The patient describes balance issues and comprehension issues.   The patient also acknowledges significant anxiety, frustration and depressive responses both to these current symptoms and difficulties as well as having anxiety and depressive symptoms in the past.  The patient has had a number of cardiovascular/pulmonary/other medical issues to cope with.  The patient is clearly distressed by her current cognitive and memory difficulties and the patient has not returned to driving or return to work.  The patient is quite anxious and worried that she will make significant errors at work and worried that something may happen if she were to try to drive.  The patient had a recent MRI of her brain done without contrast on 11/04/2018.  The impressions from this MRI showed no acute intracranial abnormality.  There was an unchanged 1.1 cm T2 hyperintense of lesion in the left superior frontal gyrus.  This finding was seen as remaining intermediate and stable from 01/2018.  There was also mild chronic small vessel ischemic disease noted.  There are other imaging reports that are available in her medical chart that have been recently conducted through [facility].  They have consistently identified issues in the upper left frontal regions that may be having impacts on surrounding left frontal regions impacting speech, motor function, attention, and mood changes.  We completed a neuropsychological evaluation on 01/11/2019 with the overall results consistent with MRI showing abnormalities in the left superior frontal gyrus.  The pattern of neuropsychological strengths and weaknesses were consistent with focal loading on symptoms primarily mediated by left frontal brain regions.  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  The patient has been given options of having continued MRI monitoring versus going in and having a biopsy versus going ahead and having a craniotomy and  removing the entire apparent tumor.  The patient is chosen to have a single surgery with it removed and then have pathology/histology make a determination of his actual diagnosis.  The patient denies any seizures but multiple EEGs have shown slowing brainwave activity in left frontal regions.  The patient is continuing to have expressive language issues and significant attention and concentration issues with severe distractibility.  Motor changes on the right hand are also noted.   Current Status:  The patient reports that she is continuing to have retrieval and memory issues, motor changes, and most notably expressive language difficulties for hesitancy in speech and word finding issues/verbal fluency issues.  The patient reports that she is managing her anxiety and stress and coping with the potential diagnosis of a glioma.  The patient reports that there is a significant family history for varying brain tumors including glioblastoma and a sister and other family members with different brain tumors/cancers..  Reliability of Information: The information is derived from 1 hour face-to-face clinical interview with the patient as well as review of available medical records.  Behavioral Observation: JHANIA TRENCHARD  presents as a 58 y.o.-year-old Right Caucasian Female who appeared her stated age. her dress was Appropriate and she was Well Groomed and her manners were Appropriate to the situation.  her  participation was indicative of Appropriate and Redirectable behaviors.  There were any physical disabilities noted.  she displayed an appropriate level of cooperation and motivation.     Interactions:    Active Appropriate  Attention:   abnormal   Memory:   abnormal; remote memory intact, recent memory impaired  Visuo-spatial:  not examined  Speech (Volume):  normal  Speech:   normal; normal  Thought Process:  Coherent and Relevant  Though Content:  WNL; not suicidal and not  homicidal  Orientation:   person, place, time/date and situation  Judgment:   Good  Planning:   Good  Affect:    Anxious  Mood:    Anxious  Insight:   Good  Intelligence:   normal  Marital Status/Living: The patient is currently living with her spouse and daughter.  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  Substance Use:  No concerns of substance abuse are reported.    Education:   HS Graduate  Medical History:   Past Medical History:  Diagnosis Date  . Allergy    seasonal  . Anemia   . Anxiety   . Basal cell cancer   . Cognitive changes   . Depression   . History of blood clotting disorder   . Hx of cerebral artery stenosis   . Hypertension   . Melanoma ([place]) 2009   insitu  . Menorrhagia 02/10/2013  . Migraine    history of/none in years  . Ovarian cancer ([place]) 1995   left ovary  . Pulmonary embolism ([place])   . Squamous cell carcinoma   . Stroke [facility])         Psychiatric History:  The patient does have a past history of depression anxiety but denies that these are producing significant symptoms at the current time.  Family Med/Psych History:  Family History  Problem Relation Age of Onset  . Cancer Father 34       colon cancer  . Colon cancer Father   . Breast cancer Mother 7  . Cancer Paternal Uncle        prostate  . Stroke Sister 28    Risk of Suicide/Violence: low the patient denies any suicidal or homicidal ideation.  Impression/DX:  Amariss C. Jasa is a 58 year old female referred by Dr. Elease Hashimoto for neuropsychological evaluation and consultation.  The patient had a significant event last year while at work.  This occurred in March 2019.  The patient reports that she has been dealing with very high blood pressure and began having acute symptoms of vision loss and confusion.  The patient was taken from her office to the emergency department at [facility].  She was initially diagnosed  with vertigo.  Follow-up 1 month later at [facility] neurology they diagnosed her with stroke.  She had abnormal MRI with indications of chronic small vessel disease as well as a 1 cm area of signal abnormality in the left superior frontal gyrus that was at the time felt to be subacute to chronic infarction.  Follow-up MRI conducted 11/04/2018 after a syncopal event with dizziness and other persistent cognitive difficulties showed unchanged 1.1 cm T2 hyperintensity lesion in the left superior frontal gyrus.  While it was felt to be indeterminate as to specific etiological factors the stability from the MRI on 01/2018 was felt to exclude infarct as an etiology.  One consideration was a low-grade neoplasm as a possibility and follow-up was warranted.  It was  also continued to be indicative of mild chronic small vessel ischemic disease.  The patient has a history of melanoma, squamous cell carcinoma of skin, basal cell carcinoma, pulmonary embolism, CVA/TIA, as well as depression and anxiety with past events including suicidal ideation.  During the interview today, the patient reports that last year she had a stroke and occurred in March 2019.  However, we are not sure about whether there is been any type of acute subacute infarction.  The patient reports that she has had significant elevated blood pressure.  The patient was taken by ambulance from her place of work to the emergency department in [facility] and was initially diagnosed with vertigo.  Follow-up with [facility] neurology initially felt that her MRI was indicative of it stroke.  This MRI was 1 month after the event in March.  More recent MRI has called into question the potential etiological factors related to the 1.1 cm lesion identified in the left superior frontal gyrus.  However, there is clear indications of mild chronic small vessel ischemic disease and the patient does have a history of pulmonary and cardiovascular issues.  The patient has had significant  elevated blood pressure readings that are being addressed cardiologically.  The patient reports that she has had significant cognitive and memory symptoms that she and her daughter both report really started developing in March 2019.  The patient reports that she has had difficulty with information processing speed as well as just general executive functioning and processing of information.  The patient describes memory difficulties and short-term memory difficulties which by the descriptions of the patient appear to be more retrieval deficits then then an inability to store or organize information.  The patient describes balance issues and comprehension issues.  The patient also acknowledges significant anxiety, frustration and depressive responses both to these current symptoms and difficulties as well as having anxiety and depressive symptoms in the past.  The patient has had a number of cardiovascular/pulmonary/other medical issues to cope with.  The patient is clearly distressed by her current cognitive and memory difficulties and the patient has not returned to driving or return to work.  The patient is quite anxious and worried that she will make significant errors at work and worried that something may happen if she were to try to drive.  The patient had a recent MRI of her brain done without contrast on 11/04/2018.  The impressions from this MRI showed no acute intracranial abnormality.  There was an unchanged 1.1 cm T2 hyperintense of lesion in the left superior frontal gyrus.  This finding was seen as remaining intermediate and stable from 01/2018.  There was also mild chronic small vessel ischemic disease noted.   The patient describes ongoing issues with information processing and executive functioning, short-term memory issues that appear primarily related to retrieval, balance issues as well as problems with comprehension and understanding.  We completed a neuropsychological evaluation on  01/11/2019 with the overall results consistent with MRI showing abnormalities in the left superior frontal gyrus.  The pattern of neuropsychological strengths and weaknesses were consistent with focal loading on symptoms primarily mediated by left frontal brain regions.  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  The patient has been given options of having continued MRI monitoring versus going in and having a biopsy versus going ahead and having a craniotomy and removing the entire apparent  tumor.  The patient is chosen to have a single surgery with it removed and then have pathology/histology make a determination of his actual diagnosis.  The patient denies any seizures but multiple EEGs have shown slowing brainwave activity in left frontal regions.  The patient is continuing to have expressive language issues and significant attention and concentration issues with severe distractibility.  Motor changes on the right hand are also noted.  The patient reports that she is continuing to have retrieval and memory issues, motor changes, and most notably expressive language difficulties for hesitancy in speech and word finding issues/verbal fluency issues.  The patient reports that she is managing her anxiety and stress and coping with the potential diagnosis of a glioma.  The patient reports that there is a significant family history for varying brain tumors including glioblastoma and a sister and other family members with different brain tumors/cancers..   Disposition/Plan:  The patient is scheduled for craniotomy and brain resection on 10/04/2019.  We have set the patient up for follow-up sometime in March or April but the states likely will be adjusted determined by her recovery post surgery and types and changes post surgery as far as cognitive functioning.  The patient would also like to be  followed for rehab in [place] and we will make sure we are ready to set up for her outpatient rehab program when she completes what ever inpatient rehab she will do following her surgery at [facility].  Diagnosis:    Neurocognitive deficits  Memory loss  Abnormal brain MRI  Moderate episode of recurrent major depressive disorder ([place])         Electronically Signed   _______________________ Ilean Skill, Psy.D.

## 2019-09-26 ENCOUNTER — Other Ambulatory Visit: Payer: Self-pay | Admitting: *Deleted

## 2019-09-26 ENCOUNTER — Encounter: Payer: Self-pay | Admitting: Hematology & Oncology

## 2019-09-26 DIAGNOSIS — Z86711 Personal history of pulmonary embolism: Secondary | ICD-10-CM

## 2019-09-26 DIAGNOSIS — I639 Cerebral infarction, unspecified: Secondary | ICD-10-CM

## 2019-09-26 MED ORDER — RIVAROXABAN 20 MG PO TABS: ORAL_TABLET | ORAL | 1 refills | Status: DC

## 2019-10-01 DIAGNOSIS — Z419 Encounter for procedure for purposes other than remedying health state, unspecified: Secondary | ICD-10-CM | POA: Diagnosis not present

## 2019-10-01 DIAGNOSIS — Z20828 Contact with and (suspected) exposure to other viral communicable diseases: Secondary | ICD-10-CM | POA: Diagnosis not present

## 2019-10-01 DIAGNOSIS — Z01812 Encounter for preprocedural laboratory examination: Secondary | ICD-10-CM | POA: Diagnosis not present

## 2019-10-03 DIAGNOSIS — G939 Disorder of brain, unspecified: Secondary | ICD-10-CM | POA: Diagnosis not present

## 2019-10-03 DIAGNOSIS — G459 Transient cerebral ischemic attack, unspecified: Secondary | ICD-10-CM | POA: Diagnosis not present

## 2019-10-03 DIAGNOSIS — R760 Raised antibody titer: Secondary | ICD-10-CM | POA: Diagnosis not present

## 2019-10-03 DIAGNOSIS — I10 Essential (primary) hypertension: Secondary | ICD-10-CM | POA: Diagnosis not present

## 2019-10-06 ENCOUNTER — Telehealth: Payer: Self-pay

## 2019-10-06 MED ORDER — GENERIC EXTERNAL MEDICATION
240.00 | Status: DC
Start: ? — End: 2019-10-06

## 2019-10-06 MED ORDER — OXYCODONE HCL 5 MG PO TABS
5.00 | ORAL_TABLET | ORAL | Status: DC
Start: ? — End: 2019-10-06

## 2019-10-06 MED ORDER — FAMOTIDINE 20 MG PO TABS
20.00 | ORAL_TABLET | ORAL | Status: DC
Start: 2019-10-06 — End: 2019-10-06

## 2019-10-06 MED ORDER — DEXTROSE 50 % IV SOLN
12.50 | INTRAVENOUS | Status: DC
Start: ? — End: 2019-10-06

## 2019-10-06 MED ORDER — BISACODYL 10 MG RE SUPP
10.00 | RECTAL | Status: DC
Start: ? — End: 2019-10-06

## 2019-10-06 MED ORDER — LEVETIRACETAM 500 MG PO TABS
500.00 | ORAL_TABLET | ORAL | Status: DC
Start: 2019-10-06 — End: 2019-10-06

## 2019-10-06 MED ORDER — ACETAMINOPHEN 325 MG PO TABS
650.00 | ORAL_TABLET | ORAL | Status: DC
Start: ? — End: 2019-10-06

## 2019-10-06 MED ORDER — HYDRALAZINE HCL 20 MG/ML IJ SOLN
10.00 | INTRAMUSCULAR | Status: DC
Start: ? — End: 2019-10-06

## 2019-10-06 MED ORDER — MORPHINE SULFATE 2 MG/ML IJ SOLN
2.00 | INTRAMUSCULAR | Status: DC
Start: ? — End: 2019-10-06

## 2019-10-06 MED ORDER — LISINOPRIL 10 MG PO TABS
10.00 | ORAL_TABLET | ORAL | Status: DC
Start: 2019-10-07 — End: 2019-10-06

## 2019-10-06 MED ORDER — ATORVASTATIN CALCIUM 20 MG PO TABS
20.00 | ORAL_TABLET | ORAL | Status: DC
Start: 2019-10-06 — End: 2019-10-06

## 2019-10-06 MED ORDER — INSULIN LISPRO 100 UNIT/ML ~~LOC~~ SOLN
0.00 | SUBCUTANEOUS | Status: DC
Start: 2019-10-06 — End: 2019-10-06

## 2019-10-06 MED ORDER — SENNOSIDES 8.6 MG PO TABS
2.00 | ORAL_TABLET | ORAL | Status: DC
Start: 2019-10-07 — End: 2019-10-06

## 2019-10-06 MED ORDER — LABETALOL HCL 5 MG/ML IV SOLN
10.00 | INTRAVENOUS | Status: DC
Start: ? — End: 2019-10-06

## 2019-10-06 MED ORDER — DEXAMETHASONE 4 MG PO TABS
4.00 | ORAL_TABLET | ORAL | Status: DC
Start: 2019-10-06 — End: 2019-10-06

## 2019-10-06 MED ORDER — POLYETHYLENE GLYCOL 3350 17 GM/SCOOP PO POWD
17.00 | ORAL | Status: DC
Start: 2019-10-07 — End: 2019-10-06

## 2019-10-06 MED ORDER — AMLODIPINE BESYLATE 2.5 MG PO TABS
2.50 | ORAL_TABLET | ORAL | Status: DC
Start: 2019-10-07 — End: 2019-10-06

## 2019-10-06 NOTE — Telephone Encounter (Signed)
Received call from pt questioning when she should restart her Xarelto. Pt had surgery at [facility] on 10/03/2019. Per Dr Marin Olp, restart Xarelto today. Pt verbalizes understanding using teach back method. Pt expecting to be discharged today and will take when she returns home. 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  dph

## 2019-10-12 ENCOUNTER — Encounter: Payer: Self-pay | Admitting: Family Medicine

## 2019-10-12 ENCOUNTER — Encounter: Payer: Self-pay | Admitting: Hematology & Oncology

## 2019-10-17 ENCOUNTER — Telehealth: Payer: Self-pay | Admitting: *Deleted

## 2019-10-17 NOTE — Telephone Encounter (Signed)
Copied from [place] [id number]. Topic: General - Other >> Oct 17, 2019  1:24 PM Kimberly Monroe wrote: Reason for CRM: Pt submitted paperwork through mychart for FMLA from her Craniotomy / Please give Pt a call to let her know it was received

## 2019-10-17 NOTE — Telephone Encounter (Signed)
I have printed the paperwork for you and placed in red folder. Please return when completed.

## 2019-10-21 ENCOUNTER — Encounter: Payer: Self-pay | Admitting: Family Medicine

## 2019-10-31 ENCOUNTER — Encounter: Payer: Self-pay | Admitting: Hematology & Oncology

## 2019-11-01 ENCOUNTER — Telehealth: Payer: Self-pay | Admitting: Hematology & Oncology

## 2019-11-01 NOTE — Telephone Encounter (Signed)
Called and spoke with patient regarding appointments added per 1/25 sch msg

## 2019-11-02 ENCOUNTER — Inpatient Hospital Stay (HOSPITAL_BASED_OUTPATIENT_CLINIC_OR_DEPARTMENT_OTHER): Payer: Managed Care, Other (non HMO) | Admitting: Hematology & Oncology

## 2019-11-02 ENCOUNTER — Encounter: Payer: Self-pay | Admitting: Hematology & Oncology

## 2019-11-02 ENCOUNTER — Other Ambulatory Visit: Payer: Self-pay

## 2019-11-02 ENCOUNTER — Inpatient Hospital Stay: Payer: Managed Care, Other (non HMO) | Attending: Hematology & Oncology

## 2019-11-02 VITALS — BP 150/85 | HR 64 | Temp 97.1°F | Resp 20 | Wt 237.8 lb

## 2019-11-02 DIAGNOSIS — Z85820 Personal history of malignant melanoma of skin: Secondary | ICD-10-CM | POA: Diagnosis not present

## 2019-11-02 DIAGNOSIS — Z79899 Other long term (current) drug therapy: Secondary | ICD-10-CM | POA: Diagnosis not present

## 2019-11-02 DIAGNOSIS — Z7901 Long term (current) use of anticoagulants: Secondary | ICD-10-CM | POA: Insufficient documentation

## 2019-11-02 DIAGNOSIS — Z86711 Personal history of pulmonary embolism: Secondary | ICD-10-CM | POA: Diagnosis not present

## 2019-11-02 DIAGNOSIS — C713 Malignant neoplasm of parietal lobe: Secondary | ICD-10-CM | POA: Diagnosis not present

## 2019-11-02 DIAGNOSIS — Z8673 Personal history of transient ischemic attack (TIA), and cerebral infarction without residual deficits: Secondary | ICD-10-CM | POA: Diagnosis not present

## 2019-11-02 DIAGNOSIS — C711 Malignant neoplasm of frontal lobe: Secondary | ICD-10-CM

## 2019-11-02 DIAGNOSIS — Z7189 Other specified counseling: Secondary | ICD-10-CM

## 2019-11-02 DIAGNOSIS — C4321 Malignant melanoma of right ear and external auricular canal: Secondary | ICD-10-CM

## 2019-11-02 HISTORY — DX: Malignant neoplasm of frontal lobe: C71.1

## 2019-11-02 HISTORY — DX: Other specified counseling: Z71.89

## 2019-11-02 LAB — CBC WITH DIFFERENTIAL (CANCER CENTER ONLY)
Abs Immature Granulocytes: 0.01 10*3/uL (ref 0.00–0.07)
Basophils Absolute: 0.1 10*3/uL (ref 0.0–0.1)
Basophils Relative: 1 %
Eosinophils Absolute: 0.1 10*3/uL (ref 0.0–0.5)
Eosinophils Relative: 2 %
HCT: 42.4 % (ref 36.0–46.0)
Hemoglobin: 14.1 g/dL (ref 12.0–15.0)
Immature Granulocytes: 0 %
Lymphocytes Relative: 31 %
Lymphs Abs: 2.4 10*3/uL (ref 0.7–4.0)
MCH: 30.1 pg (ref 26.0–34.0)
MCHC: 33.3 g/dL (ref 30.0–36.0)
MCV: 90.6 fL (ref 80.0–100.0)
Monocytes Absolute: 0.5 10*3/uL (ref 0.1–1.0)
Monocytes Relative: 7 %
Neutro Abs: 4.5 10*3/uL (ref 1.7–7.7)
Neutrophils Relative %: 59 %
Platelet Count: 308 10*3/uL (ref 150–400)
RBC: 4.68 MIL/uL (ref 3.87–5.11)
RDW: 13.1 % (ref 11.5–15.5)
WBC Count: 7.5 10*3/uL (ref 4.0–10.5)
nRBC: 0 % (ref 0.0–0.2)

## 2019-11-02 LAB — CMP (CANCER CENTER ONLY)
ALT: 38 U/L (ref 0–44)
AST: 25 U/L (ref 15–41)
Albumin: 4.1 g/dL (ref 3.5–5.0)
Alkaline Phosphatase: 108 U/L (ref 38–126)
Anion gap: 6 (ref 5–15)
BUN: 14 mg/dL (ref 6–20)
CO2: 30 mmol/L (ref 22–32)
Calcium: 9.7 mg/dL (ref 8.9–10.3)
Chloride: 104 mmol/L (ref 98–111)
Creatinine: 0.82 mg/dL (ref 0.44–1.00)
GFR, Est AFR Am: >60 mL/min (ref >60)
GFR, Estimated: >60 mL/min (ref >60)
Glucose, Bld: 92 mg/dL (ref 70–99)
Potassium: 4.3 mmol/L (ref 3.5–5.1)
Sodium: 140 mmol/L (ref 135–145)
Total Bilirubin: 0.4 mg/dL (ref 0.3–1.2)
Total Protein: 6.6 g/dL (ref 6.5–8.1)

## 2019-11-02 NOTE — Progress Notes (Signed)
Hematology and Oncology Follow Up Visit  [name] [id number] 02-02-1961 59 y.o. 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  Surprisingly enough, she had surgery for a low-grade astrocytoma.  It was amazing the story that she told me as to how this was found.  She had surgery at [facility].  She was incredibly impressed with the care that she got there.  Surgery was done on December 29.  The pathology report shows a low-grade astrocytoma of the left parietal lobe.  She has seen a Child psychotherapist at [facility].  He wants to start her on radiation and Temodar.  He is worried about her having the history of thromboembolic disease.  I really do not think this is going to be a problem.  Through our extensive evaluation of her, that we cannot find any obvious thrombophilic state that cause her pulmonary embolism.  She had no problems with surgery.  She is back on her Xarelto.  She has had no problems with seizures.  She has had no problems with visual changes.  There is no difficulty in talking or word finding.  She is not sure as to when she can start the radiation therapy.  Apparently, she says that the tumor was sent off for some genetic testing.  Overall, I would say her performance status is ECOG 1.  On her left earlobe about 9 days ago.  She had a melanoma that was removed.  We are trying to get the pathology report to see if this was a invasive melanoma or if this was an in situ melanoma.  She had a skin graft placed.  The skin came from the left upper chest wall.  Otherwise she is doing well.  She does have family up in [place].  She is worried about them with the coronavirus.  She is doing well on the  Xarelto.  She has had no problems with recurrent thromboembolic disease.  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  Overall, her performance status is ECOG 1.   Medications:  Allergies as of 11/02/2019      Reactions   Lime Flavor [flavoring Agent] Anaphylaxis   Amoxicillin Other (See Comments)   Patient stated,"my mom told me to never take it. I don't even know what type of reaction."   Penicillins Other (See Comments)   Did it involve swelling of the face/tongue/throat, SOB, or low BP? Unknown Did it involve sudden or severe rash/hives, skin peeling, or any reaction on the inside of your mouth or nose? Unknown Did you need to seek medical attention at a hospital or doctor's office? Unknown When did it last happen? Childhood allergy If all above answers are "NO", may proceed with cephalosporin use. Patient stated,"I was told not to take it."      Medication List       Accurate as of November 02, 2019  5:04 PM. If you have any questions, ask your nurse or doctor.        STOP taking these medications   doxycycline 100 MG tablet Commonly known as: VIBRA-TABS Stopped by: Volanda Napoleon, MD   lamoTRIgine 100 MG tablet Commonly known  as: LAMICTAL Stopped by: Volanda Napoleon, MD   lamoTRIgine 25 MG tablet Commonly known as: LAMICTAL Stopped by: Volanda Napoleon, MD   LORazepam 1 MG tablet Commonly known as: ATIVAN Stopped by: Volanda Napoleon, MD     TAKE these medications   amLODipine 2.5 MG tablet Commonly known as: NORVASC TAKE 1 TABLET BY MOUTH EVERY DAY   atorvastatin 20 MG tablet Commonly known as: LIPITOR TAKE 1 TABLET BY MOUTH EVERY DAY   cyanocobalamin 1000 MCG tablet Take by mouth.   lisinopril 10 MG tablet Commonly known as: ZESTRIL TAKE 1 TABLET BY MOUTH EVERY DAY   rivaroxaban 20 MG Tabs tablet Commonly known as: Xarelto TAKE 1 TABLET (20 MG TOTAL) BY MOUTH DAILY WITH SUPPER.   Vitamin D  (Ergocalciferol) 1.25 MG (50000 UNIT) Caps capsule Commonly known as: DRISDOL Take one capsule by mouth weekly.       Allergies:  Allergies  Allergen Reactions  . Lime Flavor [Flavoring Agent] Anaphylaxis  . Amoxicillin Other (See Comments)    Patient stated,"my mom told me to never take it. I don't even know what type of reaction."  . Penicillins Other (See Comments)    Did it involve swelling of the face/tongue/throat, SOB, or low BP? Unknown Did it involve sudden or severe rash/hives, skin peeling, or any reaction on the inside of your mouth or nose? Unknown Did you need to seek medical attention at a hospital or doctor's office? Unknown When did it last happen? Childhood allergy If all above answers are "NO", may proceed with cephalosporin use.   Patient stated,"I was told not to take it."     Past Medical History, Surgical history, Social history, and Family History were reviewed and updated.  Review of Systems: Review of Systems  Constitutional: Negative.   Eyes: Negative.   Respiratory: Negative.   Cardiovascular: Negative.   Gastrointestinal: Negative.   Genitourinary: Negative.   Musculoskeletal: Negative.   Skin: Negative.   Neurological: Negative.   Endo/Heme/Allergies: Negative.   Psychiatric/Behavioral: Negative.      Physical Exam:  weight is 237 lb 12.8 oz (107.9 kg). Her temporal temperature is 97.1 F (36.2 C) (abnormal). Her blood pressure is 150/85 (abnormal) and her pulse is 64. Her respiration is 20 and oxygen saturation is 99%.   Wt Readings from Last 3 Encounters:  11/02/19 237 lb 12.8 oz (107.9 kg)  02/04/19 220 lb 1.9 oz (99.8 kg)  12/07/18 210 lb 8 oz (95.5 kg)    Physical Exam Vitals reviewed.  HENT:     Head: Normocephalic and atraumatic.     Comments: On examination of her cranium, she has a healing craniectomy scar in the left parietal area.  This is healing nicely.    Ears:     Comments: She has a dressing on the left  earlobe.  There is no adenopathy in the neck. Eyes:     Pupils: Pupils are equal, round, and reactive to light.  Neck:     Comments: She has a dressing in the left supraclavicular region where the skin graft was taken. Cardiovascular:     Rate and Rhythm: Normal rate and regular rhythm.     Heart sounds: Normal heart sounds.  Pulmonary:     Effort: Pulmonary effort is normal.     Breath sounds: Normal breath sounds.  Abdominal:     General: Bowel sounds are normal.     Palpations: Abdomen is soft.  Musculoskeletal:  General: No tenderness or deformity. Normal range of motion.     Cervical back: Normal range of motion.  Lymphadenopathy:     Cervical: No cervical adenopathy.  Skin:    General: Skin is warm and dry.     Findings: No erythema or rash.  Neurological:     Mental Status: She is alert and oriented to person, place, and time.  Psychiatric:        Behavior: Behavior normal.        Thought Content: Thought content normal.        Judgment: Judgment normal.      Lab Results  Component Value Date   WBC 7.5 11/02/2019   HGB 14.1 11/02/2019   HCT 42.4 11/02/2019   MCV 90.6 11/02/2019   PLT 308 11/02/2019   Lab Results  Component Value Date   FERRITIN 82 02/04/2019   IRON 77 02/04/2019   TIBC 280 02/04/2019   UIBC 203 02/04/2019   IRONPCTSAT 27 02/04/2019   Lab Results  Component Value Date   RETICCTPCT 1.5 09/06/2012   RBC 4.68 11/02/2019   RETICCTABS 67.7 09/06/2012   No results found for: KPAFRELGTCHN, LAMBDASER, KAPLAMBRATIO No results found for: IGGSERUM, IGA, IGMSERUM No results found for: Odetta Pink, SPEI   Chemistry      Component Value Date/Time   NA 140 11/02/2019 1435   NA 141 07/23/2016 0956   K 4.3 11/02/2019 1435   K 4.2 07/23/2016 0956   CL 104 11/02/2019 1435   CO2 30 11/02/2019 1435   CO2 25 07/23/2016 0956   BUN 14 11/02/2019 1435   BUN 14.6 07/23/2016 0956   CREATININE  0.82 11/02/2019 1435   CREATININE 0.7 07/23/2016 0956      Component Value Date/Time   CALCIUM 9.7 11/02/2019 1435   CALCIUM 9.3 07/23/2016 0956   ALKPHOS 108 11/02/2019 1435   ALKPHOS 97 07/23/2016 0956   AST 25 11/02/2019 1435   AST 25 07/23/2016 0956   ALT 38 11/02/2019 1435   ALT 27 07/23/2016 0956   BILITOT 0.4 11/02/2019 1435   BILITOT 0.30 07/23/2016 0956       Impression and Plan: Kimberly Monroe is a very pleasant 59 yo caucasian female with history of idiopathic PE which has since resolved.   Again, I do not see any problems with her having this thromboembolic issue.  She has a idiopathic etiology for the pulmonary embolism.  She will be on anticoagulation for life.  I think as long she is on anticoagulation, she really should have no issues with radiation or Temodar.  I cannot imagine that the Temodar is going to cause significant pancytopenia that would cause [place] to have problems with the Xarelto.  We can certainly help out up here in [place].  If she needs IV fluids or any type of supportive care, we could do it up instead of her having to travel to [facility].  She has a very good relationship with her oncologist down at [facility].  She has no problems being treated down there with radiation.  I spent about 45 minutes with her today.  This was really a new problem that we are dealing with.  I would like to see her back in 2 months.    Volanda Napoleon, MD 1/27/20215:04 PM

## 2019-11-03 ENCOUNTER — Telehealth: Payer: Self-pay | Admitting: Hematology & Oncology

## 2019-11-03 LAB — LACTATE DEHYDROGENASE: LDH: 176 U/L (ref 98–192)

## 2019-11-03 NOTE — Telephone Encounter (Signed)
Called and advised patient appointments added per 1/27 los

## 2019-11-04 ENCOUNTER — Ambulatory Visit: Payer: Managed Care, Other (non HMO) | Admitting: Physical Therapy

## 2019-11-08 ENCOUNTER — Encounter: Payer: Self-pay | Admitting: Family Medicine

## 2019-11-09 ENCOUNTER — Telehealth: Payer: Self-pay | Admitting: Family Medicine

## 2019-11-09 NOTE — Telephone Encounter (Signed)
Pt stated not to fill out the FMLA papers because she is going to see her Oncologist and going to speak to them regarding the form. It is important that he does not fax those to her work.  Pt would like a message back on mychart stating that [facility] has received those forms and that he will not fill them out or fax them back.

## 2019-11-09 NOTE — Telephone Encounter (Signed)
noted 

## 2019-11-09 NOTE — Telephone Encounter (Signed)
Please see message. °

## 2019-11-15 ENCOUNTER — Other Ambulatory Visit: Payer: Self-pay | Admitting: Family Medicine

## 2019-11-21 ENCOUNTER — Telehealth: Payer: Self-pay

## 2019-11-21 ENCOUNTER — Telehealth: Payer: Self-pay | Admitting: Psychology

## 2019-11-21 NOTE — Telephone Encounter (Signed)
Patient would like to speak with Dr. Sima Matas, has brain cancer and is being treated at [facility].

## 2019-11-21 NOTE — Telephone Encounter (Signed)
Patient had a crainiotomy since your last visit - wants to know if you can follow up with a call for a follow up plan.[phone] to Dr Sima Matas

## 2019-11-23 ENCOUNTER — Encounter: Payer: Self-pay | Admitting: *Deleted

## 2019-12-05 ENCOUNTER — Encounter: Payer: Self-pay | Admitting: Family Medicine

## 2019-12-09 ENCOUNTER — Other Ambulatory Visit: Payer: Self-pay | Admitting: Family Medicine

## 2020-01-03 ENCOUNTER — Inpatient Hospital Stay: Payer: Managed Care, Other (non HMO) | Admitting: Hematology & Oncology

## 2020-01-03 ENCOUNTER — Other Ambulatory Visit: Payer: Self-pay | Admitting: Family Medicine

## 2020-01-03 ENCOUNTER — Inpatient Hospital Stay: Payer: Managed Care, Other (non HMO) | Attending: Hematology & Oncology

## 2020-01-11 ENCOUNTER — Ambulatory Visit (INDEPENDENT_AMBULATORY_CARE_PROVIDER_SITE_OTHER): Payer: Managed Care, Other (non HMO) | Admitting: Family Medicine

## 2020-01-11 ENCOUNTER — Encounter: Payer: Self-pay | Admitting: Family Medicine

## 2020-01-11 ENCOUNTER — Encounter: Payer: Managed Care, Other (non HMO) | Attending: Psychology | Admitting: Psychology

## 2020-01-11 ENCOUNTER — Telehealth: Payer: Self-pay | Admitting: Hematology & Oncology

## 2020-01-11 ENCOUNTER — Other Ambulatory Visit: Payer: Self-pay

## 2020-01-11 VITALS — BP 136/78 | HR 70 | Temp 98.1°F | Wt 231.3 lb

## 2020-01-11 DIAGNOSIS — Z86711 Personal history of pulmonary embolism: Secondary | ICD-10-CM

## 2020-01-11 DIAGNOSIS — I10 Essential (primary) hypertension: Secondary | ICD-10-CM

## 2020-01-11 DIAGNOSIS — C711 Malignant neoplasm of frontal lobe: Secondary | ICD-10-CM

## 2020-01-11 DIAGNOSIS — E785 Hyperlipidemia, unspecified: Secondary | ICD-10-CM

## 2020-01-11 LAB — COMPREHENSIVE METABOLIC PANEL
ALT: 42 U/L — ABNORMAL HIGH (ref 0–35)
AST: 29 U/L (ref 0–37)
Albumin: 4.1 g/dL (ref 3.5–5.2)
Alkaline Phosphatase: 109 U/L (ref 39–117)
BUN: 13 mg/dL (ref 6–23)
CO2: 28 mEq/L (ref 19–32)
Calcium: 9.8 mg/dL (ref 8.4–10.5)
Chloride: 103 mEq/L (ref 96–112)
Creatinine, Ser: 0.81 mg/dL (ref 0.40–1.20)
GFR: 72.46 mL/min (ref >60.00)
Glucose, Bld: 93 mg/dL (ref 70–99)
Potassium: 4.4 mEq/L (ref 3.5–5.1)
Sodium: 138 mEq/L (ref 135–145)
Total Bilirubin: 0.8 mg/dL (ref 0.2–1.2)
Total Protein: 6.4 g/dL (ref 6.0–8.3)

## 2020-01-11 LAB — CBC WITH DIFFERENTIAL/PLATELET
Basophils Absolute: 0.1 10*3/uL (ref 0.0–0.1)
Basophils Relative: 1.1 % (ref 0.0–3.0)
Eosinophils Absolute: 0.1 10*3/uL (ref 0.0–0.7)
Eosinophils Relative: 1.4 % (ref 0.0–5.0)
HCT: 44.5 % (ref 36.0–46.0)
Hemoglobin: 15.1 g/dL — ABNORMAL HIGH (ref 12.0–15.0)
Lymphocytes Relative: 34.7 % (ref 12.0–46.0)
Lymphs Abs: 2.1 10*3/uL (ref 0.7–4.0)
MCHC: 34 g/dL (ref 30.0–36.0)
MCV: 90 fl (ref 78.0–100.0)
Monocytes Absolute: 0.6 10*3/uL (ref 0.1–1.0)
Monocytes Relative: 9.9 % (ref 3.0–12.0)
Neutro Abs: 3.2 10*3/uL (ref 1.4–7.7)
Neutrophils Relative %: 52.9 % (ref 43.0–77.0)
Platelets: 231 10*3/uL (ref 150.0–400.0)
RBC: 4.95 Mil/uL (ref 3.87–5.11)
RDW: 13.7 % (ref 11.5–15.5)
WBC: 6.1 10*3/uL (ref 4.0–10.5)

## 2020-01-11 LAB — LIPID PANEL
Cholesterol: 138 mg/dL (ref 0–200)
HDL: 45.1 mg/dL (ref >39.00)
LDL Cholesterol: 72 mg/dL (ref 0–99)
NonHDL: 92.45
Total CHOL/HDL Ratio: 3
Triglycerides: 102 mg/dL (ref 0.0–149.0)
VLDL: 20.4 mg/dL (ref 0.0–40.0)

## 2020-01-11 MED ORDER — ATORVASTATIN CALCIUM 20 MG PO TABS: 20.0000 mg | ORAL_TABLET | Freq: Every day | ORAL | 3 refills | Status: DC

## 2020-01-11 MED ORDER — LISINOPRIL 10 MG PO TABS: 10.0000 mg | ORAL_TABLET | Freq: Every day | ORAL | 3 refills | Status: DC

## 2020-01-11 NOTE — Progress Notes (Signed)
Subjective:     Patient ID: Kimberly Monroe, female   DOB: [birth year], 59 y.o.   MRN: [id number]  HPI   Kimberly Monroe is seen for medical follow-up.  She had astrocytoma excision [facility] December 29.  She just recently completed radiation and will be getting repeat MRI soon with determination at that point whether she will be getting chemotherapy.  She had some fatigue from the radiation but overall tolerating well.  No headaches.  Generally doing well with no major neurologic complaints  She has history of pulmonary emboli and is on chronic anticoagulation.  She is followed by hematology.  She is scheduled to see them soon.  They had recommended labs including CBC, comprehensive metabolic panel, and lactate dehydrogenase level  She has not yet received Covid vaccine but other family members have.  She has hypertension treated with lisinopril 10 mg daily.  She had been on low-dose amlodipine but apparently not taking the amlodipine.  Her blood pressures have been stable.  She has hyperlipidemia treated with Lipitor and is overdue for follow-up labs.  No myalgias.  Compliant with therapy.  Past Medical History:  Diagnosis Date  . Allergy    seasonal  . Anemia   . Anxiety   . Astrocytoma of frontal lobe ([place]) 11/02/2019  . Basal cell cancer   . Cognitive changes   . Depression   . Goals of care, counseling/discussion 11/02/2019  . History of blood clotting disorder   . Hx of cerebral artery stenosis   . Hypertension   . Melanoma ([place]) 2009   insitu  . Menorrhagia 02/10/2013  . Migraine    history of/none in years  . Ovarian cancer ([place]) 1995   left ovary  . Pulmonary embolism ([place])   . Squamous cell carcinoma   . Stroke [facility])    Past Surgical History:  Procedure Laterality Date  . ABDOMINAL HYSTERECTOMY  03/06/2013  . BREAST DUCTAL SYSTEM EXCISION  2009   L intraductal papilloma  . CEREBRAL ANGIOGRAM  2020  . [place]   twins, singleton  . IR ANGIO INTRA  EXTRACRAN SEL COM CAROTID INNOMINATE BILAT MOD SED  11/05/2018  . IR ANGIO VERTEBRAL SEL SUBCLAVIAN INNOMINATE UNI L MOD SED  11/05/2018  . IR ANGIO VERTEBRAL SEL VERTEBRAL UNI R MOD SED  11/05/2018  . IR US GUIDE VASC ACCESS RIGHT  11/05/2018  . laparoscopy with ovarian cystectomy  1994   ovarian torsion  . 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 MOHS SURGERY  2009   Dr Link Snuffer    reports that she has never smoked. She has never used smokeless tobacco. She reports current alcohol use. She reports that she does not use drugs. family history includes Breast cancer (age of onset: 53) in her mother; Cancer in her paternal uncle; Cancer (age of onset: 57) in her father; Colon cancer in her father; Stroke (age of onset: 50) in her sister. Allergies  Allergen Reactions  . Lime Flavor [Flavoring Agent] Anaphylaxis  . Amoxicillin Other (See Comments)    Patient stated,"my mom told me to never take it. I don't even know what type of reaction."  . Penicillins Other (See Comments)    Did it involve swelling of the face/tongue/throat, SOB, or low BP? Unknown Did it involve sudden or severe rash/hives, skin peeling, or any reaction on the inside of your mouth or nose? Unknown Did you need to seek medical attention at a hospital or doctor's office?  Unknown When did it last happen? Childhood allergy If all above answers are "NO", may proceed with cephalosporin use.   Patient stated,"I was told not to take it."      Review of Systems  Constitutional: Negative for chills, fatigue and fever.  Eyes: Negative for visual disturbance.  Respiratory: Negative for cough, chest tightness, shortness of breath and wheezing.   Cardiovascular: Negative for chest pain, palpitations and leg swelling.  Genitourinary: Negative for dysuria.  Neurological: Negative for dizziness, seizures, syncope, weakness, light-headedness and headaches.       Objective:   Physical Exam Vitals reviewed.  Constitutional:       Appearance: Normal appearance.  Cardiovascular:     Rate and Rhythm: Normal rate and regular rhythm.  Pulmonary:     Effort: Pulmonary effort is normal.     Breath sounds: Normal breath sounds.  Musculoskeletal:     Right lower leg: No edema.     Left lower leg: No edema.  Neurological:     General: No focal deficit present.     Mental Status: She is alert.     Cranial Nerves: No cranial nerve deficit.  Psychiatric:        Mood and Affect: Mood normal.        Thought Content: Thought content normal.        Assessment:     #1 hypertension well controlled.  Repeat reading left arm at rest 122/78  #2 dyslipidemia treated with Lipitor-due for follow-up labs  #3 history of astrocytoma excision of brain recently and status post radiation therapy  #4 history of pulmonary emboli- on chronic anti-coagulation.  #5 hx of melanoma- getting regular follow up per dermatology.      Plan:     -Check labs today with lipid panel, CBC, comprehensive metabolic panel, lactate dehydrogenase  -Refill lisinopril and Lipitor for 1 year  Eulas Post MD [facility] at [facility]

## 2020-01-11 NOTE — Telephone Encounter (Signed)
Called and spoke with patient about r/s her 12/2019 appts due to being at [facility] for radiation.  She is ok with new date/time per 4/7 phone call

## 2020-01-12 LAB — LACTATE DEHYDROGENASE: LDH: 136 U/L (ref 120–250)

## 2020-01-16 ENCOUNTER — Encounter: Payer: Self-pay | Admitting: Hematology & Oncology

## 2020-01-19 ENCOUNTER — Other Ambulatory Visit: Payer: Self-pay

## 2020-01-19 ENCOUNTER — Encounter: Payer: Self-pay | Admitting: Hematology & Oncology

## 2020-01-19 ENCOUNTER — Inpatient Hospital Stay: Payer: Managed Care, Other (non HMO) | Attending: Hematology & Oncology | Admitting: Hematology & Oncology

## 2020-01-19 ENCOUNTER — Other Ambulatory Visit: Payer: Managed Care, Other (non HMO)

## 2020-01-19 VITALS — BP 138/88 | HR 68 | Temp 96.9°F | Resp 20 | Wt 231.0 lb

## 2020-01-19 DIAGNOSIS — Z8673 Personal history of transient ischemic attack (TIA), and cerebral infarction without residual deficits: Secondary | ICD-10-CM | POA: Insufficient documentation

## 2020-01-19 DIAGNOSIS — Z923 Personal history of irradiation: Secondary | ICD-10-CM | POA: Insufficient documentation

## 2020-01-19 DIAGNOSIS — Z7901 Long term (current) use of anticoagulants: Secondary | ICD-10-CM | POA: Insufficient documentation

## 2020-01-19 DIAGNOSIS — Z86711 Personal history of pulmonary embolism: Secondary | ICD-10-CM | POA: Diagnosis not present

## 2020-01-19 DIAGNOSIS — Z86718 Personal history of other venous thrombosis and embolism: Secondary | ICD-10-CM | POA: Insufficient documentation

## 2020-01-19 DIAGNOSIS — C4322 Malignant melanoma of left ear and external auricular canal: Secondary | ICD-10-CM | POA: Diagnosis not present

## 2020-01-19 DIAGNOSIS — C711 Malignant neoplasm of frontal lobe: Secondary | ICD-10-CM

## 2020-01-19 DIAGNOSIS — Z79899 Other long term (current) drug therapy: Secondary | ICD-10-CM | POA: Insufficient documentation

## 2020-01-19 DIAGNOSIS — C713 Malignant neoplasm of parietal lobe: Secondary | ICD-10-CM | POA: Diagnosis not present

## 2020-01-19 DIAGNOSIS — D509 Iron deficiency anemia, unspecified: Secondary | ICD-10-CM | POA: Diagnosis not present

## 2020-01-19 NOTE — Progress Notes (Signed)
Hematology and Oncology Follow Up Visit  Kimberly Monroe [id number] Aug 22, 1961 59 y.o. 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  She actually looks quite good.  She completed her radiation to the brain for the resected low-grade astrocytoma on April 1.  They did not use Temodar with the radiation.  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  She is now having problems with her insurance paying for the Xarelto.  She has been on Xarelto for many years.  I am not sure what the insurance issue is right now.  She has to be on Xarelto.  She has had a past history of DVT.  She has had no problems with the Xarelto.  She has had no problems with cough or shortness of breath.  There is been no change in bowel or bladder habits.  She has had no visual issues.  She has had no headache.  Overall, her performance status is ECOG 1.  Medications:  Allergies as of 01/19/2020      Reactions   Lime Flavor [flavoring Agent] Anaphylaxis   Amoxicillin Other (See Comments)   Patient stated,"my mom told me to never take it. I don't even know what type of reaction."   Penicillins Other (See Comments)   Did it involve swelling of the face/tongue/throat, SOB, or low BP? Unknown Did it involve sudden or severe rash/hives, skin peeling, or any reaction on the inside of your mouth or nose? Unknown Did you need to seek medical attention at a hospital or doctor's office? Unknown When did it last happen? Childhood allergy If all above answers are "NO", may proceed with cephalosporin use. Patient stated,"I was told not to take it."      Medication List       Accurate as of  January 19, 2020 12:30 PM. If you have any questions, ask your nurse or doctor.        STOP taking these medications   amLODipine 2.5 MG tablet Commonly known as: NORVASC Stopped by: Volanda Napoleon, MD     TAKE these medications   atorvastatin 20 MG tablet Commonly known as: LIPITOR Take 1 tablet (20 mg total) by mouth daily.   cyanocobalamin 1000 MCG tablet Take by mouth.   lisinopril 10 MG tablet Commonly known as: ZESTRIL Take 1 tablet (10 mg total) by mouth daily. Please schedule follow up for refills.   memantine 5 MG tablet Commonly known as: NAMENDA 1.) Take 1 tablet (5 mg) each morning for 7 days; 2.) Take 1 tablet (5 mg) each morning and each evening for 7 days; 3.) Take 1 tablet (5 mg) each morning and 2 tablets (10 mg) each evening for 7 days; 4.) Take 2 tablets (10 mg) each morning and each evening thereafter   ondansetron 4 MG disintegrating tablet Commonly known as: ZOFRAN-ODT Take 4 mg by mouth every 8 (eight) hours as needed.   rivaroxaban 20 MG Tabs tablet Commonly known as: Xarelto TAKE 1 TABLET (20 MG TOTAL) BY MOUTH DAILY WITH SUPPER.   Vitamin D (Ergocalciferol) 1.25 MG (50000 UNIT) Caps capsule Commonly known as: DRISDOL Take one capsule by mouth weekly.  Allergies:  Allergies  Allergen Reactions  . Lime Flavor [Flavoring Agent] Anaphylaxis  . Amoxicillin Other (See Comments)    Patient stated,"my mom told me to never take it. I don't even know what type of reaction."  . Penicillins Other (See Comments)    Did it involve swelling of the face/tongue/throat, SOB, or low BP? Unknown Did it involve sudden or severe rash/hives, skin peeling, or any reaction on the inside of your mouth or nose? Unknown Did you need to seek medical attention at a hospital or doctor's office? Unknown When did it last happen? Childhood allergy If all above answers are "NO", may proceed with cephalosporin use.   Patient stated,"I was told not to take it."      Past Medical History, Surgical history, Social history, and Family History were reviewed and updated.  Review of Systems: Review of Systems  Constitutional: Negative.   Eyes: Negative.   Respiratory: Negative.   Cardiovascular: Negative.   Gastrointestinal: Negative.   Genitourinary: Negative.   Musculoskeletal: Negative.   Skin: Negative.   Neurological: Negative.   Endo/Heme/Allergies: Negative.   Psychiatric/Behavioral: Negative.      Physical Exam:  weight is 231 lb (104.8 kg). Her temporal temperature is 96.9 F (36.1 C) (abnormal). Her blood pressure is 138/88 and her pulse is 68. Her respiration is 20 and oxygen saturation is 98%.   Wt Readings from Last 3 Encounters:  01/19/20 231 lb (104.8 kg)  01/11/20 231 lb 4.8 oz (104.9 kg)  11/02/19 237 lb 12.8 oz (107.9 kg)    Physical Exam Vitals reviewed.  HENT:     Head: Normocephalic and atraumatic.     Comments: On examination of her cranium, she has a healing craniectomy scar in the left parietal area.  This is healing nicely.    Ears:     Comments: She has a dressing on the left earlobe.  There is no adenopathy in the neck. Eyes:     Pupils: Pupils are equal, round, and reactive to light.  Neck:     Comments: She has a dressing in the left supraclavicular region where the skin graft was taken. Cardiovascular:     Rate and Rhythm: Normal rate and regular rhythm.     Heart sounds: Normal heart sounds.  Pulmonary:     Effort: Pulmonary effort is normal.     Breath sounds: Normal breath sounds.  Abdominal:     General: Bowel sounds are normal.     Palpations: Abdomen is soft.  Musculoskeletal:        General: No tenderness or deformity. Normal range of motion.     Cervical back: Normal range of motion.  Lymphadenopathy:     Cervical: No cervical adenopathy.  Skin:    General: Skin is warm and dry.     Findings: No erythema or rash.  Neurological:     Mental Status: She is alert and oriented to person,  place, and time.  Psychiatric:        Behavior: Behavior normal.        Thought Content: Thought content normal.        Judgment: Judgment normal.      Lab Results  Component Value Date   WBC 6.1 01/11/2020   HGB 15.1 (H) 01/11/2020   HCT 44.5 01/11/2020   MCV 90.0 01/11/2020   PLT 231.0 01/11/2020   Lab Results  Component Value Date   FERRITIN 82 02/04/2019   IRON 77 02/04/2019   TIBC 280 02/04/2019  UIBC 203 02/04/2019   IRONPCTSAT 27 02/04/2019   Lab Results  Component Value Date   RETICCTPCT 1.5 09/06/2012   RBC 4.95 01/11/2020   RETICCTABS 67.7 09/06/2012   No results found for: KPAFRELGTCHN, LAMBDASER, KAPLAMBRATIO No results found for: Kandis Cocking, IGMSERUM No results found for: Odetta Pink, SPEI   Chemistry      Component Value Date/Time   NA 138 01/11/2020 1404   NA 141 07/23/2016 0956   K 4.4 01/11/2020 1404   K 4.2 07/23/2016 0956   CL 103 01/11/2020 1404   CO2 28 01/11/2020 1404   CO2 25 07/23/2016 0956   BUN 13 01/11/2020 1404   BUN 14.6 07/23/2016 0956   CREATININE 0.81 01/11/2020 1404   CREATININE 0.82 11/02/2019 1435   CREATININE 0.7 07/23/2016 0956      Component Value Date/Time   CALCIUM 9.8 01/11/2020 1404   CALCIUM 9.3 07/23/2016 0956   ALKPHOS 109 01/11/2020 1404   ALKPHOS 97 07/23/2016 0956   AST 29 01/11/2020 1404   AST 25 11/02/2019 1435   AST 25 07/23/2016 0956   ALT 42 (H) 01/11/2020 1404   ALT 38 11/02/2019 1435   ALT 27 07/23/2016 0956   BILITOT 0.8 01/11/2020 1404   BILITOT 0.4 11/02/2019 1435   BILITOT 0.30 07/23/2016 0956       Impression and Plan: Ms. Nachbar is a very pleasant 59 yo caucasian female with history of idiopathic PE which has since resolved.   The real problem now is try to make sure that insurance will cover the Xarelto.  I think it is ridiculous that they are not going to cover this now.  I am not sure what the issue is.  However, I am positive  that our staff will clearly help out.  I would like to see her back in about 6 weeks.  It sounds like from the notes I have read that she will be on Temodar as a single agent.  I am just glad that she has gone through the radiation.  She says she goes for MRI of the brain on April 26.   Volanda Napoleon, MD 4/15/202112:30 PM

## 2020-01-24 ENCOUNTER — Telehealth: Payer: Self-pay | Admitting: *Deleted

## 2020-01-24 NOTE — Telephone Encounter (Signed)
Call received from patient stating that [facility] at [facility] has informed her that Dr. Antonieta Pert office has not done PA on Xarelto.  Call placed to Merrilee Seashore at [place] and phone number given to this RN to call [employer] to initiate PA.  Call placed to [phone] to initiate PA for [facility].  Pt informed.

## 2020-02-07 ENCOUNTER — Other Ambulatory Visit: Payer: Self-pay | Admitting: Family Medicine

## 2020-02-08 ENCOUNTER — Other Ambulatory Visit: Payer: Self-pay

## 2020-02-08 ENCOUNTER — Encounter: Payer: Self-pay | Admitting: Psychology

## 2020-02-08 ENCOUNTER — Encounter: Payer: Managed Care, Other (non HMO) | Attending: Psychology | Admitting: Psychology

## 2020-02-08 DIAGNOSIS — Z8673 Personal history of transient ischemic attack (TIA), and cerebral infarction without residual deficits: Secondary | ICD-10-CM | POA: Insufficient documentation

## 2020-02-08 DIAGNOSIS — Z923 Personal history of irradiation: Secondary | ICD-10-CM | POA: Diagnosis not present

## 2020-02-08 DIAGNOSIS — Z800 Family history of malignant neoplasm of digestive organs: Secondary | ICD-10-CM | POA: Insufficient documentation

## 2020-02-08 DIAGNOSIS — R29818 Other symptoms and signs involving the nervous system: Secondary | ICD-10-CM | POA: Diagnosis not present

## 2020-02-08 DIAGNOSIS — Z86711 Personal history of pulmonary embolism: Secondary | ICD-10-CM | POA: Diagnosis not present

## 2020-02-08 DIAGNOSIS — C711 Malignant neoplasm of frontal lobe: Secondary | ICD-10-CM

## 2020-02-08 DIAGNOSIS — I10 Essential (primary) hypertension: Secondary | ICD-10-CM | POA: Diagnosis not present

## 2020-02-08 DIAGNOSIS — Z8543 Personal history of malignant neoplasm of ovary: Secondary | ICD-10-CM | POA: Insufficient documentation

## 2020-02-08 DIAGNOSIS — R4189 Other symptoms and signs involving cognitive functions and awareness: Secondary | ICD-10-CM | POA: Diagnosis not present

## 2020-02-08 DIAGNOSIS — F419 Anxiety disorder, unspecified: Secondary | ICD-10-CM | POA: Diagnosis not present

## 2020-02-08 DIAGNOSIS — R413 Other amnesia: Secondary | ICD-10-CM

## 2020-02-08 DIAGNOSIS — Z85820 Personal history of malignant melanoma of skin: Secondary | ICD-10-CM | POA: Diagnosis not present

## 2020-02-08 DIAGNOSIS — Z803 Family history of malignant neoplasm of breast: Secondary | ICD-10-CM | POA: Insufficient documentation

## 2020-02-08 NOTE — Progress Notes (Signed)
Neuropsychological Consultation   Patient:   Kimberly Monroe   DOB:   [birth year]  MR Number:  [id number]  Location:  [facility] [place], [place] [id number] [place] [address] Dept: [phone]           Date of Service:   02/08/2020  Start Time:   8 AM End Time:   9 AM  Today's visit is an in person visit that was conducted in my outpatient clinic office that lasted for 1 hour and the patient myself were present.  She is now had her brain surgery for brain cancer and has completed radiation and is getting ready to start chemotherapy.  Provider/Observer:  Ilean Skill, Psy.D.       Clinical Neuropsychologist       Billing Code/Service: 96158/96159  Reason for Service:  Lus C. Alicea is a 59 year old female initially referred by Dr. Elease Hashimoto for neuropsychological evaluation and consultation.  The patient had a significant event prior while at work.  This occurred in March 2019.  The patient reports that she has been dealing with very high blood pressure and began having acute symptoms of vision loss and confusion.  The patient was taken from her office to the emergency department at [facility].  She was initially diagnosed with vertigo.  Follow-up 1 month later at [facility] neurology they diagnosed her with stroke.  She had abnormal MRI with indications of chronic small vessel disease as well as a 1 cm area of signal abnormality in the left superior frontal gyrus that was at the time felt to be subacute to chronic infarction.  Follow-up MRI conducted 11/04/2018 after a syncopal event with dizziness and other persistent cognitive difficulties showed unchanged 1.1 cm T2 hyperintensity lesion in the left superior frontal gyrus.  While it was felt to be indeterminate as to specific etiological factors the stability from the MRI on 01/2018 was felt to exclude infarct as an  etiology.  One consideration was a low-grade neoplasm as a possibility and follow-up was warranted.  It was also continued to be indicative of mild chronic small vessel ischemic disease.  The patient has a history of melanoma, squamous cell carcinoma of skin, basal cell carcinoma, pulmonary embolism, CVA/TIA, as well as depression and anxiety.  During the interview today, the patient reports that last year she had a stroke and occurred in March 2019.  However, we are not sure about whether there is been any type of acute subacute infarction.  The patient reports that she has had significant elevated blood pressure.  The patient was taken by ambulance from her place of work to the emergency department in [facility] and was initially diagnosed with vertigo.  Follow-up with [facility] neurology initially felt that her MRI was indicative of it stroke.  This MRI was 1 month after the event in March.  More recent MRI has called into question the potential etiological factors related to the 1.1 cm lesion identified in the left superior frontal gyrus.  However, there is clear indications of mild chronic small vessel ischemic disease and the patient does have a history of pulmonary and cardiovascular issues.  The patient has had significant elevated blood pressure readings that are being addressed cardiologically.  The patient reports that she has had significant cognitive and memory symptoms that she and her daughter both report really started developing in March 2019.  The patient reports that she  has had difficulty with information processing speed as well as just general executive functioning and processing of information.  The patient describes memory difficulties and short-term memory difficulties which by the descriptions of the patient appear to be more retrieval deficits then then an inability to store or organize information.  The patient describes balance issues and comprehension issues.  The patient also  acknowledges significant anxiety, frustration and depressive responses both to these current symptoms and difficulties as well as having anxiety and depressive symptoms in the past.  The patient has had a number of cardiovascular/pulmonary/other medical issues to cope with.  The patient is clearly distressed by her current cognitive and memory difficulties and the patient has not returned to driving or return to work.  The patient is quite anxious and worried that she will make significant errors at work and worried that something may happen if she were to try to drive.  The patient had a recent MRI of her brain done without contrast on 11/04/2018.  The impressions from this MRI showed no acute intracranial abnormality.  There was an unchanged 1.1 cm T2 hyperintense of lesion in the left superior frontal gyrus.  This finding was seen as remaining intermediate and stable from 01/2018.  There was also mild chronic small vessel ischemic disease noted.  There are other imaging reports that are available in her medical chart that have been recently conducted through [facility].  They have consistently identified issues in the upper left frontal regions that may be having impacts on surrounding left frontal regions impacting speech, motor function, attention, and mood changes.  We completed a neuropsychological evaluation on 01/11/2019 with the overall results consistent with MRI showing abnormalities in the left superior frontal gyrus.  The pattern of neuropsychological strengths and weaknesses were consistent with focal loading on symptoms primarily mediated by left frontal brain regions.  More recently, the patient has been followed at [facility] and most recent neuro imaging have strongly suggested that her abnormal MRI is related to a glioma.  The patient has been given options of having continued MRI monitoring versus going in and having a biopsy versus going ahead and having a craniotomy and removing the  entire apparent tumor.  The patient is chosen to have a single surgery with it removed and then have pathology/histology make a determination of his actual diagnosis.  The patient denies any seizures but multiple EEGs have shown slowing brainwave activity in left frontal regions.  The patient is continuing to have expressive language issues and significant attention and concentration issues with severe distractibility.  Motor changes on the right hand are also noted.  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   Current Status:  The patient has had a very difficult time dealing with the complexities of her medical status and oncology interventions in combination of her frontal lobe deficits keeping up with schedules, medications etc. this is been a very stressful time for her which is exacerbated some of her anxiety and coping mechanisms.  Reliability of Information: The information is derived from 1 hour face-to-face clinical interview with the patient as well as review of available medical records.  Behavioral Observation: ANNLOUISE COUCH  presents as a 59 y.o.-year-old Right Caucasian Female who appeared her stated age. her dress was Appropriate and she was Well Groomed and her manners were Appropriate to the  situation.  her participation was indicative of Appropriate and Redirectable behaviors.  There were any physical disabilities noted.  she displayed an appropriate level of cooperation and motivation.     Interactions:    Active Appropriate  Attention:   abnormal   Memory:   abnormal; remote memory intact, recent memory impaired  Visuo-spatial:  not examined  Speech (Volume):  normal  Speech:   normal; normal  Thought Process:  Coherent and Relevant  Though Content:  WNL; not suicidal and not homicidal  Orientation:   person, place,  time/date and situation  Judgment:   Good  Planning:   Good  Affect:    Anxious  Mood:    Anxious  Insight:   Good  Intelligence:   normal  Marital Status/Living: The patient is currently living with her spouse and daughter.  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  Substance Use:  No concerns of substance abuse are reported.    Education:   HS Graduate  Medical History:   Past Medical History:  Diagnosis Date  . Allergy    seasonal  . Anemia   . Anxiety   . 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 Basal cell cancer   . Cognitive changes   . Depression   . Goals of care, counseling/discussion 11/02/2019  . History of blood clotting disorder   . Hx of cerebral artery stenosis   . Hypertension   . Melanoma ([place]) 2009   insitu  . Menorrhagia 02/10/2013  . Migraine    history of/none in years  . Ovarian cancer ([place]) 1995   left ovary  . Pulmonary embolism ([place])   . Squamous cell carcinoma   . Stroke [facility])         Psychiatric History:  The patient does have a past history of depression anxiety but denies that these are producing significant symptoms at the current time.  Family Med/Psych History:  Family History  Problem Relation Age of Onset  . Cancer Father 38       colon cancer  . Colon cancer Father   . Breast cancer Mother 63  . Cancer Paternal Uncle        prostate  . Stroke Sister 30    Risk of Suicide/Violence: low the patient denies any suicidal or homicidal ideation.  Impression/DX:  Annahi C. Rickey is a 59 year old female referred by Dr. Elease Hashimoto for neuropsychological evaluation and consultation.  The patient had a significant event last year while at work.  This occurred in March 2019.  The patient reports that she has been dealing with very high blood pressure and began having acute symptoms of vision loss and confusion.  The patient was taken from her office to the emergency department  at [facility].  She was initially diagnosed with vertigo.  Follow-up 1 month later at [facility] neurology they diagnosed her with stroke.  She had abnormal MRI with indications of chronic small vessel disease as well as a 1 cm area of signal abnormality in the left superior frontal gyrus that was at the time felt to be subacute to chronic infarction.  Follow-up MRI conducted 11/04/2018 after a syncopal event with dizziness and other persistent cognitive difficulties showed unchanged 1.1 cm T2 hyperintensity lesion in the left superior frontal gyrus.  While it was felt to be indeterminate as to specific etiological factors the stability from the MRI on 01/2018 was felt to exclude infarct as an  etiology.  One consideration was a low-grade neoplasm as a possibility and follow-up was warranted.  It was also continued to be indicative of mild chronic small vessel ischemic disease.  The patient has a history of melanoma, squamous cell carcinoma of skin, basal cell carcinoma, pulmonary embolism, CVA/TIA, as well as depression and anxiety with past events including suicidal ideation.  During the interview today, the patient reports that last year she had a stroke and occurred in March 2019.  However, we are not sure about whether there is been any type of acute subacute infarction.  The patient reports that she has had significant elevated blood pressure.  The patient was taken by ambulance from her place of work to the emergency department in [facility] and was initially diagnosed with vertigo.  Follow-up with [facility] neurology initially felt that her MRI was indicative of it stroke.  This MRI was 1 month after the event in March.  More recent MRI has called into question the potential etiological factors related to the 1.1 cm lesion identified in the left superior frontal gyrus.  However, there is clear indications of mild chronic small vessel ischemic disease and the patient does have a history of pulmonary and  cardiovascular issues.  The patient has had significant elevated blood pressure readings that are being addressed cardiologically.  The patient reports that she has had significant cognitive and memory symptoms that she and her daughter both report really started developing in March 2019.  The patient reports that she has had difficulty with information processing speed as well as just general executive functioning and processing of information.  The patient describes memory difficulties and short-term memory difficulties which by the descriptions of the patient appear to be more retrieval deficits then then an inability to store or organize information.  The patient describes balance issues and comprehension issues.  The patient also acknowledges significant anxiety, frustration and depressive responses both to these current symptoms and difficulties as well as having anxiety and depressive symptoms in the past.  The patient has had a number of cardiovascular/pulmonary/other medical issues to cope with.  The patient is clearly distressed by her current cognitive and memory difficulties and the patient has not returned to driving or return to work.  The patient is quite anxious and worried that she will make significant errors at work and worried that something may happen if she were to try to drive.  The patient had a recent MRI of her brain done without contrast on 11/04/2018.  The impressions from this MRI showed no acute intracranial abnormality.  There was an unchanged 1.1 cm T2 hyperintense of lesion in the left superior frontal gyrus.  This finding was seen as remaining intermediate and stable from 01/2018.  There was also mild chronic small vessel ischemic disease noted.   The patient describes ongoing issues with information processing and executive functioning, short-term memory issues that appear primarily related to retrieval, balance issues as well as problems with comprehension and  understanding.  We completed a neuropsychological evaluation on 01/11/2019 with the overall results consistent with MRI showing abnormalities in the left superior frontal gyrus.  The pattern of neuropsychological strengths and weaknesses were consistent with focal loading on symptoms primarily mediated by left frontal brain regions.  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  The patient has been given options of having continued MRI monitoring versus  going in and having a biopsy versus going ahead and having a craniotomy and removing the entire apparent tumor.  The patient is chosen to have a single surgery with it removed and then have pathology/histology make a determination of his actual diagnosis.  The patient denies any seizures but multiple EEGs have shown slowing brainwave activity in left frontal regions.  The patient is continuing to have expressive language issues and significant attention and concentration issues with severe distractibility.  Motor changes on the right hand are also noted.  The patient reports that she is continuing to have retrieval and memory issues, motor changes, and most notably expressive language difficulties for hesitancy in speech and word finding issues/verbal fluency issues.  The patient reports that she is managing her anxiety and stress and coping with the potential diagnosis of a glioma.  The patient reports that there is a significant family history for varying brain tumors including glioblastoma and a sister and other family members with different brain tumors/cancers.  .We completed a neuropsychological evaluation on 01/11/2019 with the overall results consistent with MRI showing abnormalities in the left superior frontal gyrus.  The pattern of neuropsychological strengths and weaknesses were consistent with focal loading on symptoms primarily  mediated by left frontal brain regions.  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  The patient has been given options of having continued MRI monitoring versus going in and having a biopsy versus going ahead and having a craniotomy and removing the entire apparent tumor.  The patient is chosen to have a single surgery with it removed and then have pathology/histology make a determination of his actual diagnosis.  The patient denies any seizures but multiple EEGs have shown slowing brainwave activity in left frontal regions.  The patient is continuing to have expressive language issues and significant attention and concentration issues with severe distractibility.  Motor changes on the right hand are also noted.  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   The patient has had a very difficult time dealing with the complexities of her medical status and oncology interventions in combination of her frontal lobe deficits keeping up with schedules, medications etc. this is been a very stressful time for her which is exacerbated some of her anxiety and coping mechanisms.    Disposition/Plan:  The patient has had a craniotomy and brain resection on 10/04/2019.  We have set the patient up for follow-up neuropsychological evaluation in July for repeat testing and assess degree of ongoing cognitive deficits.  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

## 2020-02-09 NOTE — Telephone Encounter (Signed)
Opened in error

## 2020-02-15 ENCOUNTER — Encounter: Payer: Self-pay | Admitting: Hematology & Oncology

## 2020-03-01 ENCOUNTER — Other Ambulatory Visit: Payer: Self-pay

## 2020-03-01 ENCOUNTER — Inpatient Hospital Stay: Payer: Managed Care, Other (non HMO) | Attending: Hematology & Oncology

## 2020-03-01 ENCOUNTER — Inpatient Hospital Stay (HOSPITAL_BASED_OUTPATIENT_CLINIC_OR_DEPARTMENT_OTHER): Payer: Managed Care, Other (non HMO) | Admitting: Hematology & Oncology

## 2020-03-01 ENCOUNTER — Encounter: Payer: Self-pay | Admitting: Hematology & Oncology

## 2020-03-01 VITALS — BP 153/90 | HR 59 | Temp 97.3°F | Resp 20

## 2020-03-01 DIAGNOSIS — Z86711 Personal history of pulmonary embolism: Secondary | ICD-10-CM | POA: Diagnosis not present

## 2020-03-01 DIAGNOSIS — C4322 Malignant melanoma of left ear and external auricular canal: Secondary | ICD-10-CM | POA: Insufficient documentation

## 2020-03-01 DIAGNOSIS — C711 Malignant neoplasm of frontal lobe: Secondary | ICD-10-CM

## 2020-03-01 DIAGNOSIS — Z8673 Personal history of transient ischemic attack (TIA), and cerebral infarction without residual deficits: Secondary | ICD-10-CM | POA: Diagnosis not present

## 2020-03-01 DIAGNOSIS — Z923 Personal history of irradiation: Secondary | ICD-10-CM | POA: Diagnosis not present

## 2020-03-01 DIAGNOSIS — C713 Malignant neoplasm of parietal lobe: Secondary | ICD-10-CM | POA: Insufficient documentation

## 2020-03-01 DIAGNOSIS — D509 Iron deficiency anemia, unspecified: Secondary | ICD-10-CM | POA: Diagnosis not present

## 2020-03-01 DIAGNOSIS — Z79899 Other long term (current) drug therapy: Secondary | ICD-10-CM | POA: Insufficient documentation

## 2020-03-01 DIAGNOSIS — Z7901 Long term (current) use of anticoagulants: Secondary | ICD-10-CM | POA: Insufficient documentation

## 2020-03-01 LAB — CBC WITH DIFFERENTIAL (CANCER CENTER ONLY)
Abs Immature Granulocytes: 0.01 10*3/uL (ref 0.00–0.07)
Basophils Absolute: 0 10*3/uL (ref 0.0–0.1)
Basophils Relative: 1 %
Eosinophils Absolute: 0.1 10*3/uL (ref 0.0–0.5)
Eosinophils Relative: 2 %
HCT: 46 % (ref 36.0–46.0)
Hemoglobin: 15.3 g/dL — ABNORMAL HIGH (ref 12.0–15.0)
Immature Granulocytes: 0 %
Lymphocytes Relative: 34 %
Lymphs Abs: 2.1 10*3/uL (ref 0.7–4.0)
MCH: 29.9 pg (ref 26.0–34.0)
MCHC: 33.3 g/dL (ref 30.0–36.0)
MCV: 89.8 fL (ref 80.0–100.0)
Monocytes Absolute: 0.5 10*3/uL (ref 0.1–1.0)
Monocytes Relative: 8 %
Neutro Abs: 3.4 10*3/uL (ref 1.7–7.7)
Neutrophils Relative %: 55 %
Platelet Count: 305 10*3/uL (ref 150–400)
RBC: 5.12 MIL/uL — ABNORMAL HIGH (ref 3.87–5.11)
RDW: 13.7 % (ref 11.5–15.5)
WBC Count: 6.1 10*3/uL (ref 4.0–10.5)
nRBC: 0 % (ref 0.0–0.2)

## 2020-03-01 LAB — CMP (CANCER CENTER ONLY)
ALT: 47 U/L — ABNORMAL HIGH (ref 0–44)
AST: 30 U/L (ref 15–41)
Albumin: 4.2 g/dL (ref 3.5–5.0)
Alkaline Phosphatase: 112 U/L (ref 38–126)
Anion gap: 7 (ref 5–15)
BUN: 15 mg/dL (ref 6–20)
CO2: 27 mmol/L (ref 22–32)
Calcium: 10 mg/dL (ref 8.9–10.3)
Chloride: 104 mmol/L (ref 98–111)
Creatinine: 0.76 mg/dL (ref 0.44–1.00)
GFR, Est AFR Am: >60 mL/min (ref >60)
GFR, Estimated: >60 mL/min (ref >60)
Glucose, Bld: 106 mg/dL — ABNORMAL HIGH (ref 70–99)
Potassium: 4.2 mmol/L (ref 3.5–5.1)
Sodium: 138 mmol/L (ref 135–145)
Total Bilirubin: 0.5 mg/dL (ref 0.3–1.2)
Total Protein: 7.1 g/dL (ref 6.5–8.1)

## 2020-03-01 NOTE — Progress Notes (Signed)
Hematology and Oncology Follow Up Visit  Kimberly Monroe [id number] [birth year] 59 y.o. 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 Kimberly Monroe is here today for follow-up.  Overall, she seems to be doing pretty well.  She had her first cycle of full dose Temodar.  Actually, it sounds like she had the first cycle at a dosage reduction.  The oncologist at [facility] all over her the increased dose to full dose.  I told her that that probably would make sense to me.  I think that the studies that had looked at Temodar were based on the standard dose.  She still has a lot of concern.  She has not had the coronavirus vaccine yet.  She is doing okay on the Xarelto.  She has had no bleeding.  She did fall and fell on her left side.  Thankfully, she did not break anything.  She says she has some numbness in the fourth and fifth digits.  I suspect she probably has injured the ulnar nerve.  She is supposed to go to a wedding this weekend.  She is hesitant to do this because she has not had the vaccine.  I tend to agree with her on this.  She has had no leg swelling.  She has had no problems with bowels or bladder.  There is been no cough or shortness of breath.  Overall, her performance status is ECOG 1.   Medications:  Allergies as of 03/01/2020      Reactions   Lime Flavor [flavoring Agent] Anaphylaxis   Amoxicillin Other (See Comments)   Patient stated,"my mom told me to never take it. I don't even know what type of reaction."   Penicillins Other (See Comments)   Did it involve swelling of the face/tongue/throat, SOB, or low BP? Unknown Did it involve sudden or severe rash/hives, skin peeling, or any  reaction on the inside of your mouth or nose? Unknown Did you need to seek medical attention at a hospital or doctor's office? Unknown When did it last happen? Childhood allergy If all above answers are "NO", may proceed with cephalosporin use. Patient stated,"I was told not to take it."      Medication List       Accurate as of Mar 01, 2020  2:16 PM. If you have any questions, ask your nurse or doctor.        amLODipine 2.5 MG tablet Commonly known as: NORVASC TAKE 1 TABLET (2.5 MG TOTAL) BY MOUTH DAILY. PLEASE SCHEDULE FOLLOW UP FOR REFILLS.   atorvastatin 20 MG tablet Commonly known as: LIPITOR Take 1 tablet (20 mg total) by mouth daily.   cyanocobalamin 1000 MCG tablet Take by mouth.   DSS 100 MG Caps Take by mouth.   ketoconazole 2 % cream Commonly known as: NIZORAL   lisinopril 10 MG tablet Commonly known as: ZESTRIL Take 1 tablet (10 mg total) by mouth daily. Please schedule follow up for refills.   LORazepam 1 MG tablet Commonly known as: ATIVAN Take 1 mg by mouth daily as needed.   memantine 10 MG tablet Commonly known as: NAMENDA Take 10 mg by mouth 2 (two) times daily.  ondansetron 4 MG disintegrating tablet Commonly known as: ZOFRAN-ODT Take 4 mg by mouth every 8 (eight) hours as needed.   ondansetron 8 MG disintegrating tablet Commonly known as: ZOFRAN-ODT Take 8 mg by mouth 3 (three) times daily.   rivaroxaban 20 MG Tabs tablet Commonly known as: Xarelto TAKE 1 TABLET (20 MG TOTAL) BY MOUTH DAILY WITH SUPPER.   temozolomide 140 MG capsule Commonly known as: TEMODAR   temozolomide 180 MG capsule Commonly known as: TEMODAR   Vitamin D (Ergocalciferol) 1.25 MG (50000 UNIT) Caps capsule Commonly known as: DRISDOL Take one capsule by mouth weekly.       Allergies:  Allergies  Allergen Reactions  . Lime Flavor [Flavoring Agent] Anaphylaxis  . Amoxicillin Other (See Comments)    Patient stated,"my mom told me to never take it. I  don't even know what type of reaction."  . Penicillins Other (See Comments)    Did it involve swelling of the face/tongue/throat, SOB, or low BP? Unknown Did it involve sudden or severe rash/hives, skin peeling, or any reaction on the inside of your mouth or nose? Unknown Did you need to seek medical attention at a hospital or doctor's office? Unknown When did it last happen? Childhood allergy If all above answers are "NO", may proceed with cephalosporin use.   Patient stated,"I was told not to take it."     Past Medical History, Surgical history, Social history, and Family History were reviewed and updated.  Review of Systems: Review of Systems  Constitutional: Negative.   Eyes: Negative.   Respiratory: Negative.   Cardiovascular: Negative.   Gastrointestinal: Negative.   Genitourinary: Negative.   Musculoskeletal: Negative.   Skin: Negative.   Neurological: Negative.   Endo/Heme/Allergies: Negative.   Psychiatric/Behavioral: Negative.      Physical Exam:  temporal temperature is 97.3 F (36.3 C) (abnormal). Her blood pressure is 153/90 (abnormal) and her pulse is 59 (abnormal). Her respiration is 20 and oxygen saturation is 98%.   Wt Readings from Last 3 Encounters:  01/19/20 231 lb (104.8 kg)  01/11/20 231 lb 4.8 oz (104.9 kg)  11/02/19 237 lb 12.8 oz (107.9 kg)    Physical Exam Vitals reviewed.  HENT:     Head: Normocephalic and atraumatic.     Comments: On examination of her cranium, she has a healing craniectomy scar in the left parietal area.  This is healing nicely.    Ears:     Comments: She has a dressing on the left earlobe.  There is no adenopathy in the neck. Eyes:     Pupils: Pupils are equal, round, and reactive to light.  Neck:     Comments: She has a dressing in the left supraclavicular region where the skin graft was taken. Cardiovascular:     Rate and Rhythm: Normal rate and regular rhythm.     Heart sounds: Normal heart sounds.    Pulmonary:     Effort: Pulmonary effort is normal.     Breath sounds: Normal breath sounds.  Abdominal:     General: Bowel sounds are normal.     Palpations: Abdomen is soft.  Musculoskeletal:        General: No tenderness or deformity. Normal range of motion.     Cervical back: Normal range of motion.  Lymphadenopathy:     Cervical: No cervical adenopathy.  Skin:    General: Skin is warm and dry.     Findings: No erythema or rash.  Neurological:     Mental  Status: She is alert and oriented to person, place, and time.  Psychiatric:        Behavior: Behavior normal.        Thought Content: Thought content normal.        Judgment: Judgment normal.      Lab Results  Component Value Date   WBC 6.1 01/11/2020   HGB 15.1 (H) 01/11/2020   HCT 44.5 01/11/2020   MCV 90.0 01/11/2020   PLT 231.0 01/11/2020   Lab Results  Component Value Date   FERRITIN 82 02/04/2019   IRON 77 02/04/2019   TIBC 280 02/04/2019   UIBC 203 02/04/2019   IRONPCTSAT 27 02/04/2019   Lab Results  Component Value Date   RETICCTPCT 1.5 09/06/2012   RBC 4.95 01/11/2020   RETICCTABS 67.7 09/06/2012   No results found for: KPAFRELGTCHN, LAMBDASER, KAPLAMBRATIO No results found for: Kandis Cocking, IGMSERUM No results found for: Odetta Pink, SPEI   Chemistry      Component Value Date/Time   NA 138 01/11/2020 1404   NA 141 07/23/2016 0956   K 4.4 01/11/2020 1404   K 4.2 07/23/2016 0956   CL 103 01/11/2020 1404   CO2 28 01/11/2020 1404   CO2 25 07/23/2016 0956   BUN 13 01/11/2020 1404   BUN 14.6 07/23/2016 0956   CREATININE 0.81 01/11/2020 1404   CREATININE 0.82 11/02/2019 1435   CREATININE 0.7 07/23/2016 0956      Component Value Date/Time   CALCIUM 9.8 01/11/2020 1404   CALCIUM 9.3 07/23/2016 0956   ALKPHOS 109 01/11/2020 1404   ALKPHOS 97 07/23/2016 0956   AST 29 01/11/2020 1404   AST 25 11/02/2019 1435   AST 25 07/23/2016 0956    ALT 42 (H) 01/11/2020 1404   ALT 38 11/02/2019 1435   ALT 27 07/23/2016 0956   BILITOT 0.8 01/11/2020 1404   BILITOT 0.4 11/02/2019 1435   BILITOT 0.30 07/23/2016 0956       Impression and Plan: Ms. Cozens is a very pleasant 59 yo caucasian female with history of idiopathic PE which has since resolved.   She is followed at [facility] for the astrocytoma.  They are managing her Temodar.  We will see what her labs are.  We will plan to get her back to see [place] in about 2 months.  I think this would be reasonable to do.  Hopefully, the full dose Temodar is not going to cause her any problems.     Volanda Napoleon, MD 5/27/20212:16 PM

## 2020-03-02 LAB — LACTATE DEHYDROGENASE: LDH: 164 U/L (ref 98–192)

## 2020-03-03 ENCOUNTER — Other Ambulatory Visit: Payer: Self-pay | Admitting: Hematology & Oncology

## 2020-03-03 DIAGNOSIS — E559 Vitamin D deficiency, unspecified: Secondary | ICD-10-CM

## 2020-03-12 ENCOUNTER — Telehealth: Payer: Self-pay

## 2020-03-12 NOTE — Telephone Encounter (Signed)
Patient called and said Kimberly Monroe advised he would waive no show fee for missed appt.  She had radiation/chemo and could not get her and my chart would not let her cancel.  I emailed [employer] asking if possible,

## 2020-03-26 ENCOUNTER — Encounter: Payer: Self-pay | Admitting: Hematology & Oncology

## 2020-03-30 ENCOUNTER — Other Ambulatory Visit: Payer: Self-pay

## 2020-03-30 ENCOUNTER — Inpatient Hospital Stay: Payer: Managed Care, Other (non HMO) | Attending: Hematology & Oncology

## 2020-03-30 DIAGNOSIS — C713 Malignant neoplasm of parietal lobe: Secondary | ICD-10-CM | POA: Insufficient documentation

## 2020-03-30 DIAGNOSIS — C711 Malignant neoplasm of frontal lobe: Secondary | ICD-10-CM

## 2020-03-30 LAB — CMP (CANCER CENTER ONLY)
ALT: 52 U/L — ABNORMAL HIGH (ref 0–44)
AST: 33 U/L (ref 15–41)
Albumin: 3.9 g/dL (ref 3.5–5.0)
Alkaline Phosphatase: 111 U/L (ref 38–126)
Anion gap: 5 (ref 5–15)
BUN: 13 mg/dL (ref 6–20)
CO2: 28 mmol/L (ref 22–32)
Calcium: 9.6 mg/dL (ref 8.9–10.3)
Chloride: 105 mmol/L (ref 98–111)
Creatinine: 0.82 mg/dL (ref 0.44–1.00)
GFR, Est AFR Am: >60 mL/min (ref >60)
GFR, Estimated: >60 mL/min (ref >60)
Glucose, Bld: 111 mg/dL — ABNORMAL HIGH (ref 70–99)
Potassium: 3.9 mmol/L (ref 3.5–5.1)
Sodium: 138 mmol/L (ref 135–145)
Total Bilirubin: 0.5 mg/dL (ref 0.3–1.2)
Total Protein: 6.5 g/dL (ref 6.5–8.1)

## 2020-03-30 LAB — CBC WITH DIFFERENTIAL (CANCER CENTER ONLY)
Abs Immature Granulocytes: 0.02 10*3/uL (ref 0.00–0.07)
Basophils Absolute: 0 10*3/uL (ref 0.0–0.1)
Basophils Relative: 0 %
Eosinophils Absolute: 0.3 10*3/uL (ref 0.0–0.5)
Eosinophils Relative: 4 %
HCT: 42.9 % (ref 36.0–46.0)
Hemoglobin: 14.5 g/dL (ref 12.0–15.0)
Immature Granulocytes: 0 %
Lymphocytes Relative: 40 %
Lymphs Abs: 2.6 10*3/uL (ref 0.7–4.0)
MCH: 30.5 pg (ref 26.0–34.0)
MCHC: 33.8 g/dL (ref 30.0–36.0)
MCV: 90.1 fL (ref 80.0–100.0)
Monocytes Absolute: 0.6 10*3/uL (ref 0.1–1.0)
Monocytes Relative: 9 %
Neutro Abs: 3.1 10*3/uL (ref 1.7–7.7)
Neutrophils Relative %: 47 %
Platelet Count: 240 10*3/uL (ref 150–400)
RBC: 4.76 MIL/uL (ref 3.87–5.11)
RDW: 14.4 % (ref 11.5–15.5)
WBC Count: 6.5 10*3/uL (ref 4.0–10.5)
nRBC: 0 % (ref 0.0–0.2)

## 2020-03-30 LAB — LACTATE DEHYDROGENASE: LDH: 135 U/L (ref 98–192)

## 2020-04-05 ENCOUNTER — Other Ambulatory Visit: Payer: Self-pay | Admitting: *Deleted

## 2020-04-05 DIAGNOSIS — C4321 Malignant melanoma of right ear and external auricular canal: Secondary | ICD-10-CM

## 2020-04-05 DIAGNOSIS — C711 Malignant neoplasm of frontal lobe: Secondary | ICD-10-CM

## 2020-04-06 ENCOUNTER — Other Ambulatory Visit: Payer: Self-pay

## 2020-04-06 ENCOUNTER — Inpatient Hospital Stay: Payer: Managed Care, Other (non HMO) | Attending: Hematology & Oncology

## 2020-04-06 ENCOUNTER — Other Ambulatory Visit: Payer: Self-pay | Admitting: Family

## 2020-04-06 DIAGNOSIS — Z85820 Personal history of malignant melanoma of skin: Secondary | ICD-10-CM | POA: Insufficient documentation

## 2020-04-06 DIAGNOSIS — Z79899 Other long term (current) drug therapy: Secondary | ICD-10-CM | POA: Diagnosis not present

## 2020-04-06 DIAGNOSIS — C4321 Malignant melanoma of right ear and external auricular canal: Secondary | ICD-10-CM

## 2020-04-06 DIAGNOSIS — Z7901 Long term (current) use of anticoagulants: Secondary | ICD-10-CM | POA: Diagnosis not present

## 2020-04-06 DIAGNOSIS — Z8673 Personal history of transient ischemic attack (TIA), and cerebral infarction without residual deficits: Secondary | ICD-10-CM | POA: Diagnosis not present

## 2020-04-06 DIAGNOSIS — C713 Malignant neoplasm of parietal lobe: Secondary | ICD-10-CM | POA: Insufficient documentation

## 2020-04-06 DIAGNOSIS — Z923 Personal history of irradiation: Secondary | ICD-10-CM | POA: Insufficient documentation

## 2020-04-06 DIAGNOSIS — Z86711 Personal history of pulmonary embolism: Secondary | ICD-10-CM | POA: Insufficient documentation

## 2020-04-06 DIAGNOSIS — I639 Cerebral infarction, unspecified: Secondary | ICD-10-CM

## 2020-04-06 DIAGNOSIS — C711 Malignant neoplasm of frontal lobe: Secondary | ICD-10-CM

## 2020-04-06 DIAGNOSIS — D509 Iron deficiency anemia, unspecified: Secondary | ICD-10-CM | POA: Insufficient documentation

## 2020-04-06 LAB — CMP (CANCER CENTER ONLY)
ALT: 45 U/L — ABNORMAL HIGH (ref 0–44)
AST: 27 U/L (ref 15–41)
Albumin: 4.1 g/dL (ref 3.5–5.0)
Alkaline Phosphatase: 113 U/L (ref 38–126)
Anion gap: 6 (ref 5–15)
BUN: 8 mg/dL (ref 6–20)
CO2: 29 mmol/L (ref 22–32)
Calcium: 9.4 mg/dL (ref 8.9–10.3)
Chloride: 105 mmol/L (ref 98–111)
Creatinine: 0.79 mg/dL (ref 0.44–1.00)
GFR, Est AFR Am: >60 mL/min (ref >60)
GFR, Estimated: >60 mL/min (ref >60)
Glucose, Bld: 104 mg/dL — ABNORMAL HIGH (ref 70–99)
Potassium: 3.9 mmol/L (ref 3.5–5.1)
Sodium: 140 mmol/L (ref 135–145)
Total Bilirubin: 0.5 mg/dL (ref 0.3–1.2)
Total Protein: 6.9 g/dL (ref 6.5–8.1)

## 2020-04-06 LAB — CBC WITH DIFFERENTIAL (CANCER CENTER ONLY)
Abs Immature Granulocytes: 0.01 10*3/uL (ref 0.00–0.07)
Basophils Absolute: 0 10*3/uL (ref 0.0–0.1)
Basophils Relative: 0 %
Eosinophils Absolute: 0.2 10*3/uL (ref 0.0–0.5)
Eosinophils Relative: 3 %
HCT: 44.3 % (ref 36.0–46.0)
Hemoglobin: 14.6 g/dL (ref 12.0–15.0)
Immature Granulocytes: 0 %
Lymphocytes Relative: 41 %
Lymphs Abs: 2.6 10*3/uL (ref 0.7–4.0)
MCH: 30.5 pg (ref 26.0–34.0)
MCHC: 33 g/dL (ref 30.0–36.0)
MCV: 92.5 fL (ref 80.0–100.0)
Monocytes Absolute: 0.5 10*3/uL (ref 0.1–1.0)
Monocytes Relative: 8 %
Neutro Abs: 3.1 10*3/uL (ref 1.7–7.7)
Neutrophils Relative %: 48 %
Platelet Count: 129 10*3/uL — ABNORMAL LOW (ref 150–400)
RBC: 4.79 MIL/uL (ref 3.87–5.11)
RDW: 14.2 % (ref 11.5–15.5)
WBC Count: 6.4 10*3/uL (ref 4.0–10.5)
nRBC: 0 % (ref 0.0–0.2)

## 2020-04-10 ENCOUNTER — Encounter: Payer: Managed Care, Other (non HMO) | Attending: Psychology | Admitting: Psychology

## 2020-04-10 ENCOUNTER — Encounter: Payer: Self-pay | Admitting: Hematology & Oncology

## 2020-04-10 ENCOUNTER — Other Ambulatory Visit: Payer: Self-pay

## 2020-04-10 DIAGNOSIS — R29818 Other symptoms and signs involving the nervous system: Secondary | ICD-10-CM

## 2020-04-10 DIAGNOSIS — Z923 Personal history of irradiation: Secondary | ICD-10-CM | POA: Diagnosis not present

## 2020-04-10 DIAGNOSIS — R413 Other amnesia: Secondary | ICD-10-CM | POA: Insufficient documentation

## 2020-04-10 DIAGNOSIS — R4189 Other symptoms and signs involving cognitive functions and awareness: Secondary | ICD-10-CM | POA: Insufficient documentation

## 2020-04-10 DIAGNOSIS — C711 Malignant neoplasm of frontal lobe: Secondary | ICD-10-CM | POA: Diagnosis not present

## 2020-04-10 DIAGNOSIS — Z86711 Personal history of pulmonary embolism: Secondary | ICD-10-CM | POA: Insufficient documentation

## 2020-04-10 DIAGNOSIS — Z800 Family history of malignant neoplasm of digestive organs: Secondary | ICD-10-CM | POA: Insufficient documentation

## 2020-04-10 DIAGNOSIS — Z803 Family history of malignant neoplasm of breast: Secondary | ICD-10-CM | POA: Diagnosis not present

## 2020-04-10 DIAGNOSIS — Z85820 Personal history of malignant melanoma of skin: Secondary | ICD-10-CM | POA: Diagnosis not present

## 2020-04-10 DIAGNOSIS — I10 Essential (primary) hypertension: Secondary | ICD-10-CM | POA: Insufficient documentation

## 2020-04-10 DIAGNOSIS — F419 Anxiety disorder, unspecified: Secondary | ICD-10-CM | POA: Insufficient documentation

## 2020-04-10 DIAGNOSIS — Z8543 Personal history of malignant neoplasm of ovary: Secondary | ICD-10-CM | POA: Insufficient documentation

## 2020-04-10 DIAGNOSIS — Z8673 Personal history of transient ischemic attack (TIA), and cerebral infarction without residual deficits: Secondary | ICD-10-CM | POA: Diagnosis not present

## 2020-04-10 DIAGNOSIS — I639 Cerebral infarction, unspecified: Secondary | ICD-10-CM

## 2020-04-11 ENCOUNTER — Telehealth: Payer: Self-pay | Admitting: Hematology & Oncology

## 2020-04-11 ENCOUNTER — Other Ambulatory Visit: Payer: Self-pay | Admitting: *Deleted

## 2020-04-11 DIAGNOSIS — C4321 Malignant melanoma of right ear and external auricular canal: Secondary | ICD-10-CM

## 2020-04-11 DIAGNOSIS — C711 Malignant neoplasm of frontal lobe: Secondary | ICD-10-CM

## 2020-04-11 NOTE — Telephone Encounter (Signed)
Called patient to clarify labwork needed, patient states she believes [facility] put in standing orders for our office.    Unable to see standing orders from [facility] will call for them to fax orders.[phone]. Spoke with Charna Elizabeth  who will fax them over again today.    Patient confirmed lab appt for 04/13/2020 at 8am.

## 2020-04-11 NOTE — Telephone Encounter (Signed)
Appointment scheduled per 7/7 sch msg.  Patient notified thru My Chart

## 2020-04-13 ENCOUNTER — Telehealth: Payer: Self-pay | Admitting: *Deleted

## 2020-04-13 ENCOUNTER — Other Ambulatory Visit: Payer: Self-pay

## 2020-04-13 ENCOUNTER — Inpatient Hospital Stay: Payer: Managed Care, Other (non HMO)

## 2020-04-13 DIAGNOSIS — C711 Malignant neoplasm of frontal lobe: Secondary | ICD-10-CM

## 2020-04-13 DIAGNOSIS — C713 Malignant neoplasm of parietal lobe: Secondary | ICD-10-CM | POA: Diagnosis not present

## 2020-04-13 DIAGNOSIS — C4321 Malignant melanoma of right ear and external auricular canal: Secondary | ICD-10-CM

## 2020-04-13 LAB — CMP (CANCER CENTER ONLY)
ALT: 41 U/L (ref 0–44)
AST: 24 U/L (ref 15–41)
Albumin: 4 g/dL (ref 3.5–5.0)
Alkaline Phosphatase: 101 U/L (ref 38–126)
Anion gap: 7 (ref 5–15)
BUN: 12 mg/dL (ref 6–20)
CO2: 28 mmol/L (ref 22–32)
Calcium: 9.4 mg/dL (ref 8.9–10.3)
Chloride: 108 mmol/L (ref 98–111)
Creatinine: 0.8 mg/dL (ref 0.44–1.00)
GFR, Est AFR Am: >60 mL/min (ref >60)
GFR, Estimated: >60 mL/min (ref >60)
Glucose, Bld: 99 mg/dL (ref 70–99)
Potassium: 4 mmol/L (ref 3.5–5.1)
Sodium: 143 mmol/L (ref 135–145)
Total Bilirubin: 0.4 mg/dL (ref 0.3–1.2)
Total Protein: 6.3 g/dL — ABNORMAL LOW (ref 6.5–8.1)

## 2020-04-13 LAB — CBC WITH DIFFERENTIAL (CANCER CENTER ONLY)
Abs Immature Granulocytes: 0.01 10*3/uL (ref 0.00–0.07)
Basophils Absolute: 0 10*3/uL (ref 0.0–0.1)
Basophils Relative: 1 %
Eosinophils Absolute: 0.1 10*3/uL (ref 0.0–0.5)
Eosinophils Relative: 2 %
HCT: 43.2 % (ref 36.0–46.0)
Hemoglobin: 14.7 g/dL (ref 12.0–15.0)
Immature Granulocytes: 0 %
Lymphocytes Relative: 43 %
Lymphs Abs: 2.6 10*3/uL (ref 0.7–4.0)
MCH: 31 pg (ref 26.0–34.0)
MCHC: 34 g/dL (ref 30.0–36.0)
MCV: 91.1 fL (ref 80.0–100.0)
Monocytes Absolute: 0.4 10*3/uL (ref 0.1–1.0)
Monocytes Relative: 7 %
Neutro Abs: 3 10*3/uL (ref 1.7–7.7)
Neutrophils Relative %: 47 %
Platelet Count: 230 10*3/uL (ref 150–400)
RBC: 4.74 MIL/uL (ref 3.87–5.11)
RDW: 14.8 % (ref 11.5–15.5)
WBC Count: 6.2 10*3/uL (ref 4.0–10.5)
nRBC: 0 % (ref 0.0–0.2)

## 2020-04-13 NOTE — Telephone Encounter (Signed)
Faxed today's lab results to [facility]/ Dr.Khagi. Fax # is [phone].

## 2020-04-13 NOTE — Progress Notes (Signed)
Neuropsychological Consultation   Patient:   Kimberly Monroe   DOB:   [birth year]  MR Number:  [id number]  Location:  [facility] [employer], [place] [id number] [address] [place] [address] Dept: [phone]           Date of Service:   04/10/2020  Start Time:   9 AM End Time:   10 AM  Today's visit is an in person visit that was conducted in my outpatient clinic office that lasted for 1 hour with the patient, her daughter, and myself present. 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 This is a follow-up reassessment visit from our neuropsychological evaluation that was done in the spring of 2020.    Provider/Observer:  Kimberly Monroe, Psy.D.       Clinical Neuropsychologist      Billing Code/Service: Neurobehavioral Status Exam [phone])   Reason for Service:  Kimberly Monroe is a 59 year old female initially referred by Dr. Elease Monroe for neuropsychological evaluation and consultation.  The patient had a significant event prior while at work.  This occurred in March 2019.  The patient reports that she has been dealing with very high blood pressure and began having acute symptoms of vision loss and confusion.  The patient was taken from her office to the emergency department at [facility].  She was initially diagnosed with vertigo.  Follow-up 1 month later at [facility] neurology they diagnosed her with stroke.  She had abnormal MRI with indications of chronic small vessel disease as well as a 1 cm area of signal abnormality in the left superior frontal gyrus that was at the time felt to be subacute to chronic infarction.  Follow-up MRI conducted 11/04/2018 after a syncopal event with dizziness and other persistent cognitive difficulties showed unchanged 1.1 cm  T2 hyperintensity lesion in the left superior frontal gyrus.  While it was felt to be indeterminate as to specific etiological factors the stability from the MRI on 01/2018 was felt to exclude infarct as an etiology.  One consideration was a low-grade neoplasm as a possibility and follow-up was warranted.  It was also continued to be indicative of mild chronic small vessel ischemic disease.  Background Information  History of Presenting Problem:   The following was copied from medical record note written by Kimberly Monroe on 02/08/2020.    Kimberly Monroe is a 59 year old female initially referred by Dr. Elease Monroe for neuropsychological evaluation and consultation.  The patient had a significant event prior while at work.  This occurred in March 2019.  The patient reports that she has been dealing with very high blood pressure and began having acute symptoms of vision loss and confusion.  The patient was taken from her office to the emergency department at [facility].  She was initially diagnosed with vertigo.  Follow-up 1 month later at [facility] neurology they diagnosed her with stroke.  She had abnormal MRI with indications of chronic small vessel disease as well as a 1 cm area of signal abnormality in the left superior frontal gyrus that was at the time felt to be subacute to chronic infarction.  Follow-up MRI conducted 11/04/2018 after a syncopal event with dizziness and other persistent cognitive difficulties showed unchanged 1.1 cm T2 hyperintensity lesion in the left superior frontal  gyrus.  While it was felt to be indeterminate as to specific etiological factors the stability from the MRI on 01/2018 was felt to exclude infarct as an etiology.  One consideration was a low-grade neoplasm as a possibility and follow-up was warranted.  It was also continued to be indicative of mild chronic small vessel ischemic disease.  The patient has a history of melanoma, squamous cell carcinoma of skin, basal  cell carcinoma, pulmonary embolism, CVA/TIA, as well as depression and anxiety.  During the interview today, the patient reports that last year she had a stroke and occurred in March 2019.  However, we are not sure about whether there is been any type of acute subacute infarction.  The patient reports that she has had significant elevated blood pressure.  The patient was taken by ambulance from her place of work to the emergency department in [facility] and was initially diagnosed with vertigo.  Follow-up with [facility] neurology initially felt that her MRI was indicative of it stroke.  This MRI was 1 month after the event in March.  More recent MRI has called into question the potential etiological factors related to the 1.1 cm lesion identified in the left superior frontal gyrus.  However, there is clear indications of mild chronic small vessel ischemic disease and the patient does have a history of pulmonary and cardiovascular issues.  The patient has had significant elevated blood pressure readings that are being addressed cardiologically.  The patient reports that she has had significant cognitive and memory symptoms that she and her daughter both report really started developing in March 2019.  The patient reports that she has had difficulty with information processing speed as well as just general executive functioning and processing of information.  The patient describes memory difficulties and short-term memory difficulties which by the descriptions of the patient appear to be more retrieval deficits then then an inability to store or organize information.  The patient describes balance issues and comprehension issues.  The patient also acknowledges significant anxiety, frustration and depressive responses both to these current symptoms and difficulties as well as having anxiety and depressive symptoms in the past.  The patient has had a number of cardiovascular/pulmonary/other medical issues to cope with.   The patient is clearly distressed by her current cognitive and memory difficulties and the patient has not returned to driving or return to work.  The patient is quite anxious and worried that she will make significant errors at work and worried that something may happen if she were to try to drive.  The patient had a recent MRI of her brain done without contrast on 11/04/2018.  The impressions from this MRI showed no acute intracranial abnormality.  There was an unchanged 1.1 cm T2 hyperintense of lesion in the left superior frontal gyrus.  This finding was seen as remaining intermediate and stable from 01/2018.  There was also mild chronic small vessel ischemic disease noted.  There are other imaging reports that are available in her medical chart that have been recently conducted through [facility].  They have consistently identified issues in the upper left frontal regions that may be having impacts on surrounding left frontal regions impacting speech, motor function, attention, and mood changes.  We completed a neuropsychological evaluation on 01/11/2019 with the overall results consistent with MRI showing abnormalities in the left superior frontal gyrus.  The pattern of neuropsychological strengths and weaknesses were consistent with focal loading on symptoms primarily mediated by left frontal brain regions.  More recently, the patient has been followed at [facility] and most recent neuro imaging have strongly suggested that her abnormal MRI is related to a glioma.  The patient has been given options of having continued MRI monitoring versus going in and having a biopsy versus going ahead and having a craniotomy and removing the entire apparent tumor.  The patient is chosen to have a single surgery with it removed and then have pathology/histology make a determination of his actual diagnosis.  The patient denies any seizures but multiple EEGs have shown slowing brainwave activity in left frontal  regions.  The patient is continuing to have expressive language issues and significant attention and concentration issues with severe distractibility.  Motor changes on the right hand are also noted.  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  Medical History:  Past Medical History:  Diagnosis Date  . Allergy    seasonal  . Anemia   . Anxiety   . 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 Basal cell cancer   . Cognitive changes   . Depression   . Goals of care, counseling/discussion 11/02/2019  . History of blood clotting disorder   . Hx of cerebral artery stenosis   . Hypertension   . Melanoma ([place]) 2009   insitu  . Menorrhagia 02/10/2013  . Migraine    history of/none in years  . Ovarian cancer ([place]) 1995   left ovary  . Pulmonary embolism ([place])   . Squamous cell carcinoma   . Stroke [facility])     Current medications:  Outpatient Encounter Medications as of 04/10/2020  Medication Sig  . amLODipine (NORVASC) 2.5 MG tablet TAKE 1 TABLET (2.5 MG TOTAL) BY MOUTH DAILY. PLEASE SCHEDULE FOLLOW UP FOR REFILLS.  Marland Kitchen atorvastatin (LIPITOR) 20 MG tablet Take 1 tablet (20 mg total) by mouth daily.  . cyanocobalamin 1000 MCG tablet Take by mouth.  Mariane Baumgarten Sodium (DSS) 100 MG CAPS Take by mouth.  Marland Kitchen ketoconazole (NIZORAL) 2 % cream   . lisinopril (ZESTRIL) 10 MG tablet Take 1 tablet (10 mg total) by mouth daily. Please schedule follow up for refills.  Marland Kitchen LORazepam (ATIVAN) 1 MG tablet Take 1 mg by mouth daily as needed.  . memantine (NAMENDA) 10 MG tablet Take 10 mg by mouth 2 (two) times daily.  . ondansetron (ZOFRAN-ODT) 4 MG disintegrating tablet Take 4 mg by mouth every 8 (eight) hours as needed.  . ondansetron (ZOFRAN-ODT) 8 MG disintegrating tablet Take 8 mg by mouth 3 (three) times daily.  Marland Kitchen temozolomide (TEMODAR) 140 MG capsule   .  temozolomide (TEMODAR) 180 MG capsule   . Vitamin D, Ergocalciferol, (DRISDOL) 1.25 MG (50000 UNIT) CAPS capsule TAKE 1 CAPSULE BY MOUTH ONE TIME PER WEEK  . XARELTO 20 MG TABS tablet TAKE 1 TABLET (20 MG TOTAL) BY MOUTH DAILY WITH SUPPER.   No facility-administered encounter medications on file as of 04/10/2020.   Current Status:  The patient has had a very difficult time dealing with the complexities of her medical status and oncology interventions in combination of her frontal lobe deficits keeping up with schedules, medications etc. this is been a very stressful time for her which is exacerbated some of her anxiety and coping mechanisms.  Reliability of Information: The information is derived from 1 hour face-to-face clinical interview with the patient as well as  review of available medical records.  Behavioral Observation: PETRITA BLUNCK  presents as a 59 y.o.-year-old Right Caucasian Female who appeared her stated age. her dress was Appropriate and she was Well Groomed and her manners were Appropriate to the situation.  her participation was indicative of Appropriate and Redirectable behaviors.  There were any physical disabilities noted.  she displayed an appropriate level of cooperation and motivation.     Interactions:    Active Appropriate  Attention:   abnormal   Memory:   abnormal; remote memory intact, recent memory impaired  Visuo-spatial:   not examined  Speech (Volume):  normal  Speech:   normal; normal  Thought Process:  Coherent and Relevant  Though Content:  WNL; not suicidal and not homicidal  Orientation:   person, place, time/date and situation  Judgment:   Good  Planning:   Good  Affect:    Anxious  Mood:    Anxious  Insight:   Good  Intelligence:   normal  Marital Status/Living: The patient is currently living with her spouse and daughter.  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  Substance  Use:  No concerns of substance abuse are reported.    Education:   HS Graduate  Medical History:   Past Medical History:  Diagnosis Date  . Allergy    seasonal  . Anemia   . Anxiety   . 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 Basal cell cancer   . Cognitive changes   . Depression   . Goals of care, counseling/discussion 11/02/2019  . History of blood clotting disorder   . Hx of cerebral artery stenosis   . Hypertension   . Melanoma ([place]) 2009   insitu  . Menorrhagia 02/10/2013  . Migraine    history of/none in years  . Ovarian cancer ([place]) 1995   left ovary  . Pulmonary embolism ([place])   . Squamous cell carcinoma   . Stroke [facility])         Psychiatric History:  The patient does have a past history of depression anxiety but denies that these are producing significant symptoms at the current time.  Family Med/Psych History:  Family History  Problem Relation Age of Onset  . Cancer Father 57       colon cancer  . Colon cancer Father   . Breast cancer Mother 60  . Cancer Paternal Uncle        prostate  . Stroke Sister 21    Risk of Suicide/Violence: low the patient denies any suicidal or homicidal ideation.  Impression/DX: Dharma C. Guillermo is a 59 year old female referred by Dr. Elease Monroe for neuropsychological evaluation and consultation.  The patient had a significant event last year while at work.  This occurred in March 2019.  The patient reports that she has been dealing with very high blood pressure and began having acute symptoms of vision loss and confusion.  The patient was taken from her office to the emergency department at [facility].  She was initially diagnosed with vertigo.  Follow-up 1 month later at [facility] neurology they diagnosed her with stroke.  She had abnormal MRI with indications of chronic small vessel disease as well as a 1 cm area of signal abnormality in the left superior frontal gyrus that was at the time felt to be subacute to chronic  infarction.  Follow-up MRI conducted 11/04/2018 after a syncopal event with dizziness and other persistent cognitive  difficulties showed unchanged 1.1 cm T2 hyperintensity lesion in the left superior frontal gyrus.  While it was felt to be indeterminate as to specific etiological factors the stability from the MRI on 01/2018 was felt to exclude infarct as an etiology.  One consideration was a low-grade neoplasm as a possibility and follow-up was warranted.  It was also continued to be indicative of mild chronic small vessel ischemic disease. The patient has a history of melanoma, squamous cell carcinoma of skin, basal cell carcinoma, pulmonary embolism, CVA/TIA, as well as depression and anxiety with past events including suicidal ideation.  The patient is clearly distressed by her current cognitive and memory difficulties and the patient has not returned to driving or return to work.  The patient is quite anxious and worried that she will make significant errors at work and worried that something may happen if she were to try to drive. She describes ongoing issues with information processing and executive functioning, short-term memory issues that appear primarily related to retrieval, balance issues as well as problems with comprehension and understanding.  We completed a neuropsychological evaluation on 01/11/2019 with the overall results consistent with MRI showing abnormalities in the left superior frontal gyrus.  The pattern of neuropsychological strengths and weaknesses were consistent with focal loading on symptoms primarily mediated by left frontal brain regions.  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  The patient has been given options of having continued MRI monitoring versus going in and having a biopsy versus going ahead and having a craniotomy and removing the entire apparent  tumor.  The patient is chosen to have a single surgery with it removed and then have pathology/histology make a determination of his actual diagnosis.  The patient denies any seizures but multiple EEGs have shown slowing brainwave activity in left frontal regions.  The patient is continuing to have expressive language issues and significant attention and concentration issues with severe distractibility.  Motor changes on the right hand are also noted.  The patient reports that she is continuing to have retrieval and memory issues, motor changes, and most notably expressive language difficulties for hesitancy in speech and word finding issues/verbal fluency issues.  The patient reports that she is managing her anxiety and stress and coping with the potential diagnosis of a glioma.  The patient reports that there is a significant family history for varying brain tumors including glioblastoma and a sister and other family members with different brain tumors/cancers.  .We completed a neuropsychological evaluation on 01/11/2019 with the overall results consistent with MRI showing abnormalities in the left superior frontal gyrus.  The pattern of neuropsychological strengths and weaknesses were consistent with focal loading on symptoms primarily mediated by left frontal brain regions.  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  The patient has been given options of having continued MRI monitoring versus going in and having a biopsy versus going ahead and having a craniotomy and removing the entire apparent tumor.  The patient is chosen to have a single surgery with it removed and then have pathology/histology make a determination of his actual diagnosis.  The patient denies any seizures but multiple EEGs have shown slowing brainwave activity in left frontal regions.  The patient is continuing to have  expressive language issues  and significant attention and concentration issues with severe distractibility.  Motor changes on the right hand are also noted.  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  The patient has had a very difficult time dealing with the complexities of her medical status and oncology interventions in combination of her frontal lobe deficits keeping up with schedules, medications etc. this is been a very stressful time for her which is exacerbated some of her anxiety and coping mechanisms.  Testing:   There is medical necessity to proceed with repeat neuropsychological assessment as the results will be compared to her previous performance in 2020 and used to aid in differential diagnosis and clinical decision-making and to inform specific treatment recommendations.  Clinical Decision Making: We have set the patient up for repeat testing to assess degree of ongoing cognitive deficits post craniotomy (December 2020)   In considering the patient's current level of functioning, level of presumed impairment, nature of symptoms, emotional and behavioral responses during the interview, level of literacy, observed level of motivation, and previous testing, a battery of tests was selected and will be administered by the patient.     Plan:    The patient will return to complete the above referenced full battery of neuropsychological testing with this provider. Education regarding testing procedures was provided to the patient. Subsequently, the patient will see this provider for a follow-up session at which time her test performances and my impressions and treatment recommendations will be reviewed in detail.    Diagnosis:   Neurocognitive deficits [ICD-10 Code: R29.818, R41.89]    Evaluation ongoing; full report to follow.        ______________________ Kimberly Monroe, Psy.D. Clinical Neuropsychologist

## 2020-04-16 ENCOUNTER — Encounter: Payer: Managed Care, Other (non HMO) | Admitting: Psychology

## 2020-04-17 ENCOUNTER — Ambulatory Visit: Payer: Managed Care, Other (non HMO) | Admitting: Psychology

## 2020-04-18 ENCOUNTER — Ambulatory Visit: Payer: Managed Care, Other (non HMO) | Admitting: Psychology

## 2020-04-19 ENCOUNTER — Ambulatory Visit: Payer: Managed Care, Other (non HMO) | Admitting: Psychology

## 2020-04-25 ENCOUNTER — Encounter: Payer: Self-pay | Admitting: Hematology & Oncology

## 2020-04-26 ENCOUNTER — Inpatient Hospital Stay (HOSPITAL_BASED_OUTPATIENT_CLINIC_OR_DEPARTMENT_OTHER): Payer: Managed Care, Other (non HMO) | Admitting: Hematology & Oncology

## 2020-04-26 ENCOUNTER — Inpatient Hospital Stay: Payer: Managed Care, Other (non HMO)

## 2020-04-26 ENCOUNTER — Other Ambulatory Visit: Payer: Self-pay

## 2020-04-26 ENCOUNTER — Encounter: Payer: Self-pay | Admitting: Hematology & Oncology

## 2020-04-26 VITALS — BP 133/92 | HR 89 | Temp 98.9°F | Resp 20 | Wt 230.0 lb

## 2020-04-26 DIAGNOSIS — C711 Malignant neoplasm of frontal lobe: Secondary | ICD-10-CM

## 2020-04-26 DIAGNOSIS — C713 Malignant neoplasm of parietal lobe: Secondary | ICD-10-CM | POA: Diagnosis not present

## 2020-04-26 LAB — CBC WITH DIFFERENTIAL (CANCER CENTER ONLY)
Abs Immature Granulocytes: 0.01 10*3/uL (ref 0.00–0.07)
Basophils Absolute: 0 10*3/uL (ref 0.0–0.1)
Basophils Relative: 0 %
Eosinophils Absolute: 0.1 10*3/uL (ref 0.0–0.5)
Eosinophils Relative: 2 %
HCT: 42 % (ref 36.0–46.0)
Hemoglobin: 14.5 g/dL (ref 12.0–15.0)
Immature Granulocytes: 0 %
Lymphocytes Relative: 31 %
Lymphs Abs: 1.9 10*3/uL (ref 0.7–4.0)
MCH: 31.3 pg (ref 26.0–34.0)
MCHC: 34.5 g/dL (ref 30.0–36.0)
MCV: 90.7 fL (ref 80.0–100.0)
Monocytes Absolute: 0.5 10*3/uL (ref 0.1–1.0)
Monocytes Relative: 8 %
Neutro Abs: 3.6 10*3/uL (ref 1.7–7.7)
Neutrophils Relative %: 59 %
Platelet Count: 298 10*3/uL (ref 150–400)
RBC: 4.63 MIL/uL (ref 3.87–5.11)
RDW: 15.6 % — ABNORMAL HIGH (ref 11.5–15.5)
WBC Count: 6.1 10*3/uL (ref 4.0–10.5)
nRBC: 0 % (ref 0.0–0.2)

## 2020-04-26 LAB — CMP (CANCER CENTER ONLY)
ALT: 61 U/L — ABNORMAL HIGH (ref 0–44)
AST: 41 U/L (ref 15–41)
Albumin: 3.6 g/dL (ref 3.5–5.0)
Alkaline Phosphatase: 125 U/L (ref 38–126)
Anion gap: 10 (ref 5–15)
BUN: 13 mg/dL (ref 6–20)
CO2: 24 mmol/L (ref 22–32)
Calcium: 8.8 mg/dL — ABNORMAL LOW (ref 8.9–10.3)
Chloride: 103 mmol/L (ref 98–111)
Creatinine: 0.66 mg/dL (ref 0.44–1.00)
GFR, Est AFR Am: >60 mL/min (ref >60)
GFR, Estimated: >60 mL/min (ref >60)
Glucose, Bld: 126 mg/dL — ABNORMAL HIGH (ref 70–99)
Potassium: 3.8 mmol/L (ref 3.5–5.1)
Sodium: 137 mmol/L (ref 135–145)
Total Bilirubin: 0.3 mg/dL (ref 0.3–1.2)
Total Protein: 6.8 g/dL (ref 6.5–8.1)

## 2020-04-26 NOTE — Progress Notes (Signed)
Hematology and Oncology Follow Up Visit  Kimberly Monroe [id number] [birth year] 59 y.o. 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  She is a little bit tired.  Typically, she comes in during the morning for her appointments with [place].  She is doing okay with the full dose Temodar.  She recently had an MRI at [facility].  This was done on 04/10/2020.  The MRI did not show any evidence of recurrent astrocytoma.  She is doing some cognitive therapy.  This seems to be helping.  She is doing okay with the Xarelto.  She has not had any problems with bleeding.  There has been no fever.  She has had no cough or shortness of breath.  She has had no change in bowel or bladder habits.  She has had no mouth sores.  Overall, her performance status is ECOG 1.    Medications:  Allergies as of 04/26/2020      Reactions   Lime Flavor [flavoring Agent] Anaphylaxis   Amoxicillin Other (See Comments)   Patient stated,"my mom told me to never take it. I don't even know what type of reaction."   Penicillins Other (See Comments)   Did it involve swelling of the face/tongue/throat, SOB, or low BP? Unknown Did it involve sudden or severe rash/hives, skin peeling, or any reaction on the inside of your mouth or nose? Unknown Did you need to seek medical attention at a hospital or doctor's office? Unknown When did it last happen? Childhood allergy If all above answers are "NO", may proceed with cephalosporin use. Patient stated,"I was told not to take it."      Medication List       Accurate as of April 26, 2020  3:36 PM. If you have any questions, ask your nurse or  doctor.        STOP taking these medications   ketoconazole 2 % cream Commonly known as: NIZORAL Stopped by: Volanda Napoleon, MD   LORazepam 1 MG tablet Commonly known as: ATIVAN Stopped by: Volanda Napoleon, MD     TAKE these medications   amLODipine 2.5 MG tablet Commonly known as: NORVASC TAKE 1 TABLET (2.5 MG TOTAL) BY MOUTH DAILY. PLEASE SCHEDULE FOLLOW UP FOR REFILLS.   atorvastatin 20 MG tablet Commonly known as: LIPITOR Take 1 tablet (20 mg total) by mouth daily.   cyanocobalamin 1000 MCG tablet Take by mouth daily.   DSS 100 MG Caps Take by mouth daily.   lisinopril 10 MG tablet Commonly known as: ZESTRIL Take 1 tablet (10 mg total) by mouth daily. Please schedule follow up for refills.   memantine 10 MG tablet Commonly known as: NAMENDA Take 10 mg by mouth 2 (two) times daily.   ondansetron 4 MG disintegrating tablet Commonly known as: ZOFRAN-ODT Take 4 mg by mouth every 8 (eight) hours as needed.   ondansetron 8 MG disintegrating tablet Commonly known as: ZOFRAN-ODT Take 8 mg by mouth 3 (three) times daily.   temozolomide 180 MG capsule Commonly known as: TEMODAR 180 mg 5 days per month. What changed: Another medication with the same name was removed. Continue taking  this medication, and follow the directions you see here. Changed by: Volanda Napoleon, MD   temozolomide 250 MG capsule Commonly known as: TEMODAR 1 cap + 1 cap x 180 mg (total dose 430 mg) PO 30 min after zofran @ [facility] for 5 consecutive days every 28 days What changed: Another medication with the same name was removed. Continue taking this medication, and follow the directions you see here. Changed by: Volanda Napoleon, MD   Vitamin D (Ergocalciferol) 1.25 MG (50000 UNIT) Caps capsule Commonly known as: DRISDOL TAKE 1 CAPSULE BY MOUTH ONE TIME PER WEEK   Xarelto 20 MG Tabs tablet Generic drug: rivaroxaban TAKE 1 TABLET (20 MG TOTAL) BY MOUTH DAILY WITH SUPPER.       Allergies:    Allergies  Allergen Reactions  . Lime Flavor [Flavoring Agent] Anaphylaxis  . Amoxicillin Other (See Comments)    Patient stated,"my mom told me to never take it. I don't even know what type of reaction."  . Penicillins Other (See Comments)    Did it involve swelling of the face/tongue/throat, SOB, or low BP? Unknown Did it involve sudden or severe rash/hives, skin peeling, or any reaction on the inside of your mouth or nose? Unknown Did you need to seek medical attention at a hospital or doctor's office? Unknown When did it last happen? Childhood allergy If all above answers are "NO", may proceed with cephalosporin use.   Patient stated,"I was told not to take it."     Past Medical History, Surgical history, Social history, and Family History were reviewed and updated.  Review of Systems: Review of Systems  Constitutional: Negative.   Eyes: Negative.   Respiratory: Negative.   Cardiovascular: Negative.   Gastrointestinal: Negative.   Genitourinary: Negative.   Musculoskeletal: Negative.   Skin: Negative.   Neurological: Negative.   Endo/Heme/Allergies: Negative.   Psychiatric/Behavioral: Negative.      Physical Exam:  weight is 230 lb (104.3 kg) (abnormal). Her oral temperature is 98.9 F (37.2 C). Her blood pressure is 133/92 (abnormal) and her pulse is 89. Her respiration is 20 and oxygen saturation is 98%.   Wt Readings from Last 3 Encounters:  04/26/20 (!) 230 lb (104.3 kg)  01/19/20 231 lb (104.8 kg)  01/11/20 231 lb 4.8 oz (104.9 kg)    Physical Exam Vitals reviewed.  HENT:     Head: Normocephalic and atraumatic.     Comments: On examination of her cranium, she has a healing craniectomy scar in the left parietal area.  This is healing nicely.    Ears:     Comments: She has a dressing on the left earlobe.  There is no adenopathy in the neck. Eyes:     Pupils: Pupils are equal, round, and reactive to light.  Neck:     Comments: She has a dressing in  the left supraclavicular region where the skin graft was taken. Cardiovascular:     Rate and Rhythm: Normal rate and regular rhythm.     Heart sounds: Normal heart sounds.  Pulmonary:     Effort: Pulmonary effort is normal.     Breath sounds: Normal breath sounds.  Abdominal:     General: Bowel sounds are normal.     Palpations: Abdomen is soft.  Musculoskeletal:        General: No tenderness or deformity. Normal range of motion.     Cervical back: Normal range of motion.  Lymphadenopathy:     Cervical: No cervical adenopathy.  Skin:  General: Skin is warm and dry.     Findings: No erythema or rash.  Neurological:     Mental Status: She is alert and oriented to person, place, and time.  Psychiatric:        Behavior: Behavior normal.        Thought Content: Thought content normal.        Judgment: Judgment normal.      Lab Results  Component Value Date   WBC 6.1 04/26/2020   HGB 14.5 04/26/2020   HCT 42.0 04/26/2020   MCV 90.7 04/26/2020   PLT 298 04/26/2020   Lab Results  Component Value Date   FERRITIN 82 02/04/2019   IRON 77 02/04/2019   TIBC 280 02/04/2019   UIBC 203 02/04/2019   IRONPCTSAT 27 02/04/2019   Lab Results  Component Value Date   RETICCTPCT 1.5 09/06/2012   RBC 4.63 04/26/2020   RETICCTABS 67.7 09/06/2012   No results found for: KPAFRELGTCHN, LAMBDASER, KAPLAMBRATIO No results found for: Kandis Cocking, IGMSERUM No results found for: Odetta Pink, SPEI   Chemistry      Component Value Date/Time   NA 137 04/26/2020 1430   NA 141 07/23/2016 0956   K 3.8 04/26/2020 1430   K 4.2 07/23/2016 0956   CL 103 04/26/2020 1430   CO2 24 04/26/2020 1430   CO2 25 07/23/2016 0956   BUN 13 04/26/2020 1430   BUN 14.6 07/23/2016 0956   CREATININE 0.66 04/26/2020 1430   CREATININE 0.7 07/23/2016 0956      Component Value Date/Time   CALCIUM 8.8 (L) 04/26/2020 1430   CALCIUM 9.3 07/23/2016 0956    ALKPHOS 125 04/26/2020 1430   ALKPHOS 97 07/23/2016 0956   AST 41 04/26/2020 1430   AST 25 07/23/2016 0956   ALT 61 (H) 04/26/2020 1430   ALT 27 07/23/2016 0956   BILITOT 0.3 04/26/2020 1430   BILITOT 0.30 07/23/2016 0956       Impression and Plan: Ms. Brenner is a very pleasant 59 yo caucasian female with history of idiopathic PE which has since resolved.   She is followed at [facility] for the astrocytoma.  They are managing her Temodar.  We will plan to get her back to see [place] in about 2 months.  I think this would be reasonable to do.  Volanda Napoleon, MD 7/22/20213:35 PM

## 2020-04-27 ENCOUNTER — Ambulatory Visit: Payer: Managed Care, Other (non HMO) | Admitting: Psychology

## 2020-04-27 LAB — LACTATE DEHYDROGENASE: LDH: 166 U/L (ref 98–192)

## 2020-04-30 ENCOUNTER — Other Ambulatory Visit: Payer: Self-pay

## 2020-04-30 ENCOUNTER — Encounter: Payer: Self-pay | Admitting: Psychology

## 2020-04-30 ENCOUNTER — Encounter (HOSPITAL_BASED_OUTPATIENT_CLINIC_OR_DEPARTMENT_OTHER): Payer: Managed Care, Other (non HMO) | Admitting: Psychology

## 2020-04-30 DIAGNOSIS — R29818 Other symptoms and signs involving the nervous system: Secondary | ICD-10-CM

## 2020-04-30 DIAGNOSIS — R4189 Other symptoms and signs involving cognitive functions and awareness: Secondary | ICD-10-CM | POA: Diagnosis not present

## 2020-04-30 DIAGNOSIS — R413 Other amnesia: Secondary | ICD-10-CM

## 2020-04-30 DIAGNOSIS — C711 Malignant neoplasm of frontal lobe: Secondary | ICD-10-CM | POA: Diagnosis not present

## 2020-04-30 DIAGNOSIS — F331 Major depressive disorder, recurrent, moderate: Secondary | ICD-10-CM

## 2020-04-30 NOTE — Progress Notes (Signed)
   Neuropsychology Note  Clinical Decision Making: In considering the patient's current level of functioning, level of presumed impairment, nature of symptoms, emotional and behavioral responses during the interview, level of literacy, and observed level of motivation/effort, a battery of tests was selected and completed by patient.   Kimberly Monroe completed 120 minutes of neuropsychological testing with this provider. The patient appeared distressed at the onset of testing and expressed significant frustration about a number of prominent stressors. She complained about several aspects of her medical care and current cancer treatment and mentioned having problems paying a recent bill; she reportedly has many outstanding medical fees that she is confused about.    Kimberly Monroe arrived on time to her scheduled testing appointment. She was appropriately attired and adequately groomed.  She wore reading glasses and did not appear to have trouble seeing.  Hearing was adequate for testing purposes.  She did not display any gross motor or movement abnormalities; Her gait was mostly steady and no tremors were observed.     The patient was alert and fully oriented.  Her mood was depressed. Affect was blunted and congruent with mood. She made good eye contact. Speech was clear with normal rate, tone, and volume. Thought processes were tangential, ruminative, and slightly paranoid. Insight and judgment were fair. She exhibited poor frustration tolerance on questions she did not know or tasks that were more difficult. She required breaks due to agitation and fatigue. She made several self-defeating remarks about her abilities throughout the evaluation and appeared to be having difficulty coping with her cognitive, emotional, and physical symptoms.   Kimberly Monroe is scheduled for another 2 hour testing appointment on 05/07/20. Her feedback appointment is scheduled for 05/09/20 at which time her results, clinical  impressions, and recommendations will be reviewed in detail.   Full report to follow.

## 2020-05-03 ENCOUNTER — Other Ambulatory Visit: Payer: Self-pay | Admitting: *Deleted

## 2020-05-03 ENCOUNTER — Telehealth: Payer: Self-pay | Admitting: Hematology & Oncology

## 2020-05-03 ENCOUNTER — Encounter: Payer: Self-pay | Admitting: Hematology & Oncology

## 2020-05-03 DIAGNOSIS — C4321 Malignant melanoma of right ear and external auricular canal: Secondary | ICD-10-CM

## 2020-05-03 DIAGNOSIS — C711 Malignant neoplasm of frontal lobe: Secondary | ICD-10-CM

## 2020-05-03 NOTE — Telephone Encounter (Signed)
Returned call to patient to schedule her Lab Only appt for 8/4 as she requested. Per 7/29 My chart message

## 2020-05-04 ENCOUNTER — Encounter: Payer: Managed Care, Other (non HMO) | Admitting: Psychology

## 2020-05-07 ENCOUNTER — Encounter: Payer: Managed Care, Other (non HMO) | Admitting: Psychology

## 2020-05-08 ENCOUNTER — Encounter: Payer: Self-pay | Admitting: Hematology & Oncology

## 2020-05-08 ENCOUNTER — Encounter: Payer: Managed Care, Other (non HMO) | Admitting: Psychology

## 2020-05-09 ENCOUNTER — Inpatient Hospital Stay (HOSPITAL_BASED_OUTPATIENT_CLINIC_OR_DEPARTMENT_OTHER): Payer: Managed Care, Other (non HMO) | Admitting: Hematology & Oncology

## 2020-05-09 ENCOUNTER — Inpatient Hospital Stay: Payer: Managed Care, Other (non HMO) | Attending: Hematology & Oncology

## 2020-05-09 ENCOUNTER — Encounter: Payer: Self-pay | Admitting: Hematology & Oncology

## 2020-05-09 ENCOUNTER — Other Ambulatory Visit: Payer: Self-pay

## 2020-05-09 ENCOUNTER — Encounter: Payer: Managed Care, Other (non HMO) | Admitting: Psychology

## 2020-05-09 VITALS — BP 133/94 | HR 71 | Temp 98.6°F | Resp 18

## 2020-05-09 DIAGNOSIS — C4322 Malignant melanoma of left ear and external auricular canal: Secondary | ICD-10-CM | POA: Diagnosis not present

## 2020-05-09 DIAGNOSIS — C711 Malignant neoplasm of frontal lobe: Secondary | ICD-10-CM | POA: Diagnosis not present

## 2020-05-09 DIAGNOSIS — Z86711 Personal history of pulmonary embolism: Secondary | ICD-10-CM | POA: Insufficient documentation

## 2020-05-09 DIAGNOSIS — Z8673 Personal history of transient ischemic attack (TIA), and cerebral infarction without residual deficits: Secondary | ICD-10-CM | POA: Insufficient documentation

## 2020-05-09 DIAGNOSIS — C4321 Malignant melanoma of right ear and external auricular canal: Secondary | ICD-10-CM

## 2020-05-09 DIAGNOSIS — C713 Malignant neoplasm of parietal lobe: Secondary | ICD-10-CM | POA: Insufficient documentation

## 2020-05-09 DIAGNOSIS — Z923 Personal history of irradiation: Secondary | ICD-10-CM | POA: Insufficient documentation

## 2020-05-09 DIAGNOSIS — D509 Iron deficiency anemia, unspecified: Secondary | ICD-10-CM | POA: Insufficient documentation

## 2020-05-09 DIAGNOSIS — Z7901 Long term (current) use of anticoagulants: Secondary | ICD-10-CM | POA: Insufficient documentation

## 2020-05-09 LAB — CBC WITH DIFFERENTIAL (CANCER CENTER ONLY)
Abs Immature Granulocytes: 0.01 10*3/uL (ref 0.00–0.07)
Basophils Absolute: 0 10*3/uL (ref 0.0–0.1)
Basophils Relative: 0 %
Eosinophils Absolute: 0.2 10*3/uL (ref 0.0–0.5)
Eosinophils Relative: 3 %
HCT: 41.1 % (ref 36.0–46.0)
Hemoglobin: 14 g/dL (ref 12.0–15.0)
Immature Granulocytes: 0 %
Lymphocytes Relative: 32 %
Lymphs Abs: 1.7 10*3/uL (ref 0.7–4.0)
MCH: 31.3 pg (ref 26.0–34.0)
MCHC: 34.1 g/dL (ref 30.0–36.0)
MCV: 91.9 fL (ref 80.0–100.0)
Monocytes Absolute: 0.5 10*3/uL (ref 0.1–1.0)
Monocytes Relative: 9 %
Neutro Abs: 2.9 10*3/uL (ref 1.7–7.7)
Neutrophils Relative %: 56 %
Platelet Count: 122 10*3/uL — ABNORMAL LOW (ref 150–400)
RBC: 4.47 MIL/uL (ref 3.87–5.11)
RDW: 14.8 % (ref 11.5–15.5)
WBC Count: 5.3 10*3/uL (ref 4.0–10.5)
nRBC: 0 % (ref 0.0–0.2)

## 2020-05-09 LAB — CMP (CANCER CENTER ONLY)
ALT: 51 U/L — ABNORMAL HIGH (ref 0–44)
AST: 28 U/L (ref 15–41)
Albumin: 3.9 g/dL (ref 3.5–5.0)
Alkaline Phosphatase: 113 U/L (ref 38–126)
Anion gap: 6 (ref 5–15)
BUN: 15 mg/dL (ref 6–20)
CO2: 27 mmol/L (ref 22–32)
Calcium: 9.2 mg/dL (ref 8.9–10.3)
Chloride: 107 mmol/L (ref 98–111)
Creatinine: 0.79 mg/dL (ref 0.44–1.00)
GFR, Est AFR Am: >60 mL/min (ref >60)
GFR, Estimated: >60 mL/min (ref >60)
Glucose, Bld: 115 mg/dL — ABNORMAL HIGH (ref 70–99)
Potassium: 3.7 mmol/L (ref 3.5–5.1)
Sodium: 140 mmol/L (ref 135–145)
Total Bilirubin: 0.4 mg/dL (ref 0.3–1.2)
Total Protein: 6.3 g/dL — ABNORMAL LOW (ref 6.5–8.1)

## 2020-05-09 NOTE — Progress Notes (Signed)
Hematology and Oncology Follow Up Visit  Kimberly Monroe [id number] May 26, 1961 60 y.o. 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  She called this morning.  She was quite upset.  Apparently, she is losing confidence in the care that she is getting that [facility].  She went over the litany of problems that she has had there.  I am sure that the doctors over at [facility] are doing a good job in trying to provide her with above standard care.  Kimberly Monroe is not sure she wants to change doctors and come back locally to see [place].  I told her that we could certainly see her here.  We can easily prescribe the Temodar.  We can easily do MRIs of her brain.  All of this is not that hard to do.  She is just worried about scheduling and not having her treatments on time.  She is worried that there are too many people who are involved with trying to get her scheduled at [facility] and something is getting lost.  She is supposed to start her I think fourth cycle of treatment with Temodar this Friday.  Again, I told her that we could certainly prescribe this for her.  She is worried about switching doctors in the middle of treatment.  I understand this.  I just want to let her know that we will be here to help her out any way that we can.  Again, I am sure that the staff down at [facility] are doing a good job with her.  Apparently the one issue that she really has that she is wanting her doctor wants in 8 months.  She is just not happy with that.  She has no problems with the nurse practitioners but she really feels that she needs to see her doctor a little  bit more often.  Otherwise, she is doing okay.  She has had no problems with headache.  She has had no cough or shortness of breath.  There is no bleeding.  She is on Xarelto and doing well with Xarelto.  She has had no change in bowel or bladder habits.  She has had no diarrhea.  Her stools might be loose on occasion.  There are no rashes.    Overall, her performance status is ECOG 1.    Medications:  Allergies as of 05/09/2020      Reactions   Lime Flavor [flavoring Agent] Anaphylaxis   Amoxicillin Other (See Comments)   Patient stated,"my mom told me to never take it. I don't even know what type of reaction."   Penicillins Other (See Comments)   Did it involve swelling of the face/tongue/throat, SOB, or low BP? Unknown Did it involve sudden or severe rash/hives, skin peeling, or any reaction on the inside of your mouth or nose? Unknown Did you need to seek medical attention at a hospital or doctor's office? Unknown When did it last happen? Childhood allergy If all above answers are "NO", may proceed with cephalosporin use. Patient stated,"I was told not to take it."      Medication List  Accurate as of May 09, 2020 10:55 AM. If you have any questions, ask your nurse or doctor.        amLODipine 2.5 MG tablet Commonly known as: NORVASC TAKE 1 TABLET (2.5 MG TOTAL) BY MOUTH DAILY. PLEASE SCHEDULE FOLLOW UP FOR REFILLS.   atorvastatin 20 MG tablet Commonly known as: LIPITOR Take 1 tablet (20 mg total) by mouth daily.   cyanocobalamin 1000 MCG tablet Take by mouth daily.   DSS 100 MG Caps Take by mouth daily.   lisinopril 10 MG tablet Commonly known as: ZESTRIL Take 1 tablet (10 mg total) by mouth daily. Please schedule follow up for refills.   memantine 10 MG tablet Commonly known as: NAMENDA Take 10 mg by mouth 2 (two) times daily.   ondansetron 4 MG disintegrating tablet Commonly known as: ZOFRAN-ODT Take 4 mg by mouth every 8 (eight) hours as  needed.   ondansetron 8 MG disintegrating tablet Commonly known as: ZOFRAN-ODT Take 8 mg by mouth 3 (three) times daily.   temozolomide 180 MG capsule Commonly known as: TEMODAR 180 mg 5 days per month.   temozolomide 250 MG capsule Commonly known as: TEMODAR 1 cap + 1 cap x 180 mg (total dose 430 mg) PO 30 min after zofran @ [facility] for 5 consecutive days every 28 days   Vitamin D (Ergocalciferol) 1.25 MG (50000 UNIT) Caps capsule Commonly known as: DRISDOL TAKE 1 CAPSULE BY MOUTH ONE TIME PER WEEK   Xarelto 20 MG Tabs tablet Generic drug: rivaroxaban TAKE 1 TABLET (20 MG TOTAL) BY MOUTH DAILY WITH SUPPER.       Allergies:  Allergies  Allergen Reactions  . Lime Flavor [Flavoring Agent] Anaphylaxis  . Amoxicillin Other (See Comments)    Patient stated,"my mom told me to never take it. I don't even know what type of reaction."  . Penicillins Other (See Comments)    Did it involve swelling of the face/tongue/throat, SOB, or low BP? Unknown Did it involve sudden or severe rash/hives, skin peeling, or any reaction on the inside of your mouth or nose? Unknown Did you need to seek medical attention at a hospital or doctor's office? Unknown When did it last happen? Childhood allergy If all above answers are "NO", may proceed with cephalosporin use.   Patient stated,"I was told not to take it."     Past Medical History, Surgical history, Social history, and Family History were reviewed and updated.  Review of Systems: Review of Systems  Constitutional: Negative.   Eyes: Negative.   Respiratory: Negative.   Cardiovascular: Negative.   Gastrointestinal: Negative.   Genitourinary: Negative.   Musculoskeletal: Negative.   Skin: Negative.   Neurological: Negative.   Endo/Heme/Allergies: Negative.   Psychiatric/Behavioral: Negative.      Physical Exam:  oral temperature is 98.6 F (37 C). Her blood pressure is 133/94 (abnormal) and her pulse is 71. Her respiration  is 18 and oxygen saturation is 97%.   Wt Readings from Last 3 Encounters:  04/26/20 (!) 230 lb (104.3 kg)  01/19/20 231 lb (104.8 kg)  01/11/20 231 lb 4.8 oz (104.9 kg)    Physical Exam Vitals reviewed.  HENT:     Head: Normocephalic and atraumatic.     Comments: On examination of her cranium, she has a healing craniectomy scar in the left parietal area.  This is healing nicely.    Ears:     Comments: She has a dressing on the left earlobe.  There is no adenopathy in the  neck. Eyes:     Pupils: Pupils are equal, round, and reactive to light.  Neck:     Comments: She has a dressing in the left supraclavicular region where the skin graft was taken. Cardiovascular:     Rate and Rhythm: Normal rate and regular rhythm.     Heart sounds: Normal heart sounds.  Pulmonary:     Effort: Pulmonary effort is normal.     Breath sounds: Normal breath sounds.  Abdominal:     General: Bowel sounds are normal.     Palpations: Abdomen is soft.  Musculoskeletal:        General: No tenderness or deformity. Normal range of motion.     Cervical back: Normal range of motion.  Lymphadenopathy:     Cervical: No cervical adenopathy.  Skin:    General: Skin is warm and dry.     Findings: No erythema or rash.  Neurological:     Mental Status: She is alert and oriented to person, place, and time.  Psychiatric:        Behavior: Behavior normal.        Thought Content: Thought content normal.        Judgment: Judgment normal.      Lab Results  Component Value Date   WBC 5.3 05/09/2020   HGB 14.0 05/09/2020   HCT 41.1 05/09/2020   MCV 91.9 05/09/2020   PLT 122 (L) 05/09/2020   Lab Results  Component Value Date   FERRITIN 82 02/04/2019   IRON 77 02/04/2019   TIBC 280 02/04/2019   UIBC 203 02/04/2019   IRONPCTSAT 27 02/04/2019   Lab Results  Component Value Date   RETICCTPCT 1.5 09/06/2012   RBC 4.47 05/09/2020   RETICCTABS 67.7 09/06/2012   No results found for: KPAFRELGTCHN,  LAMBDASER, KAPLAMBRATIO No results found for: Kandis Cocking, IGMSERUM No results found for: Odetta Pink, SPEI   Chemistry      Component Value Date/Time   NA 140 05/09/2020 0856   NA 141 07/23/2016 0956   K 3.7 05/09/2020 0856   K 4.2 07/23/2016 0956   CL 107 05/09/2020 0856   CO2 27 05/09/2020 0856   CO2 25 07/23/2016 0956   BUN 15 05/09/2020 0856   BUN 14.6 07/23/2016 0956   CREATININE 0.79 05/09/2020 0856   CREATININE 0.7 07/23/2016 0956      Component Value Date/Time   CALCIUM 9.2 05/09/2020 0856   CALCIUM 9.3 07/23/2016 0956   ALKPHOS 113 05/09/2020 0856   ALKPHOS 97 07/23/2016 0956   AST 28 05/09/2020 0856   AST 25 07/23/2016 0956   ALT 51 (H) 05/09/2020 0856   ALT 27 07/23/2016 0956   BILITOT 0.4 05/09/2020 0856   BILITOT 0.30 07/23/2016 0956       Impression and Plan: Ms. Paulhus is a very pleasant 59 yo caucasian female with history of idiopathic PE which has since resolved.   She is followed at [facility] for the astrocytoma.  They are managing her Temodar.  Again, she will decide as to what she wants to do.  I reassured her that we can certainly do what is needed to care for her.  She will talk to someone her family and friends and will let us know if we will be taking over her care.  If not, hopefully she will feel more confident with her care down at [facility].  Otherwise, we will keep her appointment as scheduled.  I  think we see her back in September.  Volanda Napoleon, MD 8/4/202110:55 AM

## 2020-05-18 ENCOUNTER — Encounter: Payer: Self-pay | Admitting: Hematology & Oncology

## 2020-06-05 ENCOUNTER — Other Ambulatory Visit: Payer: Self-pay | Admitting: *Deleted

## 2020-06-05 DIAGNOSIS — C4321 Malignant melanoma of right ear and external auricular canal: Secondary | ICD-10-CM

## 2020-06-05 DIAGNOSIS — C711 Malignant neoplasm of frontal lobe: Secondary | ICD-10-CM

## 2020-06-06 ENCOUNTER — Other Ambulatory Visit: Payer: Self-pay

## 2020-06-06 ENCOUNTER — Inpatient Hospital Stay: Payer: Managed Care, Other (non HMO) | Attending: Hematology & Oncology

## 2020-06-06 DIAGNOSIS — Z923 Personal history of irradiation: Secondary | ICD-10-CM | POA: Insufficient documentation

## 2020-06-06 DIAGNOSIS — C713 Malignant neoplasm of parietal lobe: Secondary | ICD-10-CM | POA: Diagnosis present

## 2020-06-06 DIAGNOSIS — Z79899 Other long term (current) drug therapy: Secondary | ICD-10-CM | POA: Insufficient documentation

## 2020-06-06 DIAGNOSIS — Z85820 Personal history of malignant melanoma of skin: Secondary | ICD-10-CM | POA: Diagnosis not present

## 2020-06-06 DIAGNOSIS — Z86718 Personal history of other venous thrombosis and embolism: Secondary | ICD-10-CM | POA: Insufficient documentation

## 2020-06-06 DIAGNOSIS — Z86711 Personal history of pulmonary embolism: Secondary | ICD-10-CM | POA: Diagnosis not present

## 2020-06-06 DIAGNOSIS — D509 Iron deficiency anemia, unspecified: Secondary | ICD-10-CM | POA: Diagnosis not present

## 2020-06-06 DIAGNOSIS — C4321 Malignant melanoma of right ear and external auricular canal: Secondary | ICD-10-CM

## 2020-06-06 DIAGNOSIS — C711 Malignant neoplasm of frontal lobe: Secondary | ICD-10-CM

## 2020-06-06 LAB — CMP (CANCER CENTER ONLY)
ALT: 42 U/L (ref 0–44)
AST: 26 U/L (ref 15–41)
Albumin: 3.9 g/dL (ref 3.5–5.0)
Alkaline Phosphatase: 115 U/L (ref 38–126)
Anion gap: 7 (ref 5–15)
BUN: 15 mg/dL (ref 6–20)
CO2: 28 mmol/L (ref 22–32)
Calcium: 9.1 mg/dL (ref 8.9–10.3)
Chloride: 105 mmol/L (ref 98–111)
Creatinine: 0.77 mg/dL (ref 0.44–1.00)
GFR, Est AFR Am: >60 mL/min (ref >60)
GFR, Estimated: >60 mL/min (ref >60)
Glucose, Bld: 105 mg/dL — ABNORMAL HIGH (ref 70–99)
Potassium: 4 mmol/L (ref 3.5–5.1)
Sodium: 140 mmol/L (ref 135–145)
Total Bilirubin: 0.3 mg/dL (ref 0.3–1.2)
Total Protein: 6.5 g/dL (ref 6.5–8.1)

## 2020-06-06 LAB — CBC WITH DIFFERENTIAL (CANCER CENTER ONLY)
Abs Immature Granulocytes: 0.01 10*3/uL (ref 0.00–0.07)
Basophils Absolute: 0 10*3/uL (ref 0.0–0.1)
Basophils Relative: 0 %
Eosinophils Absolute: 0.1 10*3/uL (ref 0.0–0.5)
Eosinophils Relative: 2 %
HCT: 40.7 % (ref 36.0–46.0)
Hemoglobin: 13.7 g/dL (ref 12.0–15.0)
Immature Granulocytes: 0 %
Lymphocytes Relative: 22 %
Lymphs Abs: 1.2 10*3/uL (ref 0.7–4.0)
MCH: 32.1 pg (ref 26.0–34.0)
MCHC: 33.7 g/dL (ref 30.0–36.0)
MCV: 95.3 fL (ref 80.0–100.0)
Monocytes Absolute: 0.5 10*3/uL (ref 0.1–1.0)
Monocytes Relative: 10 %
Neutro Abs: 3.4 10*3/uL (ref 1.7–7.7)
Neutrophils Relative %: 66 %
Platelet Count: 124 10*3/uL — ABNORMAL LOW (ref 150–400)
RBC: 4.27 MIL/uL (ref 3.87–5.11)
RDW: 15 % (ref 11.5–15.5)
WBC Count: 5.2 10*3/uL (ref 4.0–10.5)
nRBC: 0 % (ref 0.0–0.2)

## 2020-06-27 ENCOUNTER — Telehealth: Payer: Self-pay | Admitting: Hematology & Oncology

## 2020-06-27 ENCOUNTER — Inpatient Hospital Stay: Payer: Managed Care, Other (non HMO)

## 2020-06-27 ENCOUNTER — Other Ambulatory Visit: Payer: Self-pay

## 2020-06-27 ENCOUNTER — Encounter: Payer: Self-pay | Admitting: Hematology & Oncology

## 2020-06-27 ENCOUNTER — Inpatient Hospital Stay (HOSPITAL_BASED_OUTPATIENT_CLINIC_OR_DEPARTMENT_OTHER): Payer: Managed Care, Other (non HMO) | Admitting: Hematology & Oncology

## 2020-06-27 VITALS — BP 125/76 | HR 61 | Temp 97.9°F | Resp 18 | Wt 231.5 lb

## 2020-06-27 DIAGNOSIS — C713 Malignant neoplasm of parietal lobe: Secondary | ICD-10-CM | POA: Diagnosis not present

## 2020-06-27 DIAGNOSIS — C711 Malignant neoplasm of frontal lobe: Secondary | ICD-10-CM

## 2020-06-27 LAB — CBC WITH DIFFERENTIAL (CANCER CENTER ONLY)
Abs Immature Granulocytes: 0.02 10*3/uL (ref 0.00–0.07)
Basophils Absolute: 0 10*3/uL (ref 0.0–0.1)
Basophils Relative: 0 %
Eosinophils Absolute: 0.1 10*3/uL (ref 0.0–0.5)
Eosinophils Relative: 2 %
HCT: 39.4 % (ref 36.0–46.0)
Hemoglobin: 13.6 g/dL (ref 12.0–15.0)
Immature Granulocytes: 0 %
Lymphocytes Relative: 23 %
Lymphs Abs: 1 10*3/uL (ref 0.7–4.0)
MCH: 33.2 pg (ref 26.0–34.0)
MCHC: 34.5 g/dL (ref 30.0–36.0)
MCV: 96.1 fL (ref 80.0–100.0)
Monocytes Absolute: 0.6 10*3/uL (ref 0.1–1.0)
Monocytes Relative: 13 %
Neutro Abs: 2.8 10*3/uL (ref 1.7–7.7)
Neutrophils Relative %: 62 %
Platelet Count: 239 10*3/uL (ref 150–400)
RBC: 4.1 MIL/uL (ref 3.87–5.11)
RDW: 14.5 % (ref 11.5–15.5)
WBC Count: 4.5 10*3/uL (ref 4.0–10.5)
nRBC: 0 % (ref 0.0–0.2)

## 2020-06-27 LAB — CMP (CANCER CENTER ONLY)
ALT: 38 U/L (ref 0–44)
AST: 24 U/L (ref 15–41)
Albumin: 3.7 g/dL (ref 3.5–5.0)
Alkaline Phosphatase: 106 U/L (ref 38–126)
Anion gap: 7 (ref 5–15)
BUN: 10 mg/dL (ref 6–20)
CO2: 29 mmol/L (ref 22–32)
Calcium: 9.6 mg/dL (ref 8.9–10.3)
Chloride: 105 mmol/L (ref 98–111)
Creatinine: 0.74 mg/dL (ref 0.44–1.00)
GFR, Est AFR Am: >60 mL/min (ref >60)
GFR, Estimated: >60 mL/min (ref >60)
Glucose, Bld: 109 mg/dL — ABNORMAL HIGH (ref 70–99)
Potassium: 4 mmol/L (ref 3.5–5.1)
Sodium: 141 mmol/L (ref 135–145)
Total Bilirubin: 0.5 mg/dL (ref 0.3–1.2)
Total Protein: 6.3 g/dL — ABNORMAL LOW (ref 6.5–8.1)

## 2020-06-27 LAB — LACTATE DEHYDROGENASE: LDH: 125 U/L (ref 98–192)

## 2020-06-27 NOTE — Progress Notes (Signed)
Hematology and Oncology Follow Up Visit  Kimberly Monroe [id number] 06/09/1961 59 y.o. 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  She is doing much better.  She is quite happy that she is getting close to the end of her on Temodar.  She had a MRI at [facility] on 31 August.  From the report, there does not appear to be any problem with respect to recurrent disease.  She does feel tired.  I told her this is typical after having so many months of Temodar.  She has had no problems with nausea or vomiting.  She has had no diarrhea or constipation.  She has had no rashes.  There is been no fever.  She has had no cough or shortness of breath.  She is very sided that one of her daughters who lives in [place] will be coming to visit in October.  Overall, her performance status is ECOG 1.   Medications:  Allergies as of 06/27/2020      Reactions   Lime Flavor [flavoring Agent] Anaphylaxis   Amoxicillin Other (See Comments)   Patient stated,"my mom told me to never take it. I don't even know what type of reaction."   Penicillins Other (See Comments)   Did it involve swelling of the face/tongue/throat, SOB, or low BP? Unknown Did it involve sudden or severe rash/hives, skin peeling, or any reaction on the inside of your mouth or nose? Unknown Did you need to seek medical attention at a hospital or doctor's office? Unknown When did it last happen? Childhood allergy If all above answers are "NO", may proceed with cephalosporin use. Patient stated,"I was told not to take it."      Medication List       Accurate as of June 27, 2020  8:04 AM. If  you have any questions, ask your nurse or doctor.        amLODipine 2.5 MG tablet Commonly known as: NORVASC TAKE 1 TABLET (2.5 MG TOTAL) BY MOUTH DAILY. PLEASE SCHEDULE FOLLOW UP FOR REFILLS.   atorvastatin 20 MG tablet Commonly known as: LIPITOR Take 1 tablet (20 mg total) by mouth daily.   cyanocobalamin 1000 MCG tablet Take by mouth daily.   DSS 100 MG Caps Take by mouth daily.   lisinopril 10 MG tablet Commonly known as: ZESTRIL Take 1 tablet (10 mg total) by mouth daily. Please schedule follow up for refills.   memantine 10 MG tablet Commonly known as: NAMENDA Take 10 mg by mouth 2 (two) times daily.   ondansetron 4 MG disintegrating tablet Commonly known as: ZOFRAN-ODT Take 4 mg by mouth every 8 (eight) hours as needed.   ondansetron 8 MG disintegrating tablet Commonly known as: ZOFRAN-ODT Take 8 mg by mouth 3 (three) times daily.   temozolomide 180 MG capsule Commonly known as: TEMODAR 180 mg 5 days per month.   temozolomide 250 MG capsule Commonly known as: TEMODAR 1 cap + 1 cap x 180 mg (total dose 430 mg) PO 30 min after zofran @ [facility] for 5 consecutive days every 28 days   Vitamin D (Ergocalciferol) 1.25 MG (50000 UNIT) Caps  capsule Commonly known as: DRISDOL TAKE 1 CAPSULE BY MOUTH ONE TIME PER WEEK   Xarelto 20 MG Tabs tablet Generic drug: rivaroxaban TAKE 1 TABLET (20 MG TOTAL) BY MOUTH DAILY WITH SUPPER.       Allergies:  Allergies  Allergen Reactions  . Lime Flavor [Flavoring Agent] Anaphylaxis  . Amoxicillin Other (See Comments)    Patient stated,"my mom told me to never take it. I don't even know what type of reaction."  . Penicillins Other (See Comments)    Did it involve swelling of the face/tongue/throat, SOB, or low BP? Unknown Did it involve sudden or severe rash/hives, skin peeling, or any reaction on the inside of your mouth or nose? Unknown Did you need to seek medical attention at a hospital or doctor's office? Unknown When did  it last happen? Childhood allergy If all above answers are "NO", may proceed with cephalosporin use.   Patient stated,"I was told not to take it."     Past Medical History, Surgical history, Social history, and Family History were reviewed and updated.  Review of Systems: Review of Systems  Constitutional: Negative.   Eyes: Negative.   Respiratory: Negative.   Cardiovascular: Negative.   Gastrointestinal: Negative.   Genitourinary: Negative.   Musculoskeletal: Negative.   Skin: Negative.   Neurological: Negative.   Endo/Heme/Allergies: Negative.   Psychiatric/Behavioral: Negative.      Physical Exam:  weight is 231 lb 8 oz (105 kg). Her oral temperature is 97.9 F (36.6 C). Her blood pressure is 125/76 and her pulse is 61. Her respiration is 18 and oxygen saturation is 98%.   Wt Readings from Last 3 Encounters:  06/27/20 231 lb 8 oz (105 kg)  04/26/20 (!) 230 lb (104.3 kg)  01/19/20 231 lb (104.8 kg)    Physical Exam Vitals reviewed.  HENT:     Head: Normocephalic and atraumatic.     Comments: On examination of her cranium, she has a healing craniectomy scar in the left parietal area.  This is healing nicely.    Ears:     Comments: She has a dressing on the left earlobe.  There is no adenopathy in the neck. Eyes:     Pupils: Pupils are equal, round, and reactive to light.  Neck:     Comments: She has a dressing in the left supraclavicular region where the skin graft was taken. Cardiovascular:     Rate and Rhythm: Normal rate and regular rhythm.     Heart sounds: Normal heart sounds.  Pulmonary:     Effort: Pulmonary effort is normal.     Breath sounds: Normal breath sounds.  Abdominal:     General: Bowel sounds are normal.     Palpations: Abdomen is soft.  Musculoskeletal:        General: No tenderness or deformity. Normal range of motion.     Cervical back: Normal range of motion.  Lymphadenopathy:     Cervical: No cervical adenopathy.  Skin:     General: Skin is warm and dry.     Findings: No erythema or rash.  Neurological:     Mental Status: She is alert and oriented to person, place, and time.  Psychiatric:        Behavior: Behavior normal.        Thought Content: Thought content normal.        Judgment: Judgment normal.      Lab Results  Component Value Date   WBC 4.5 06/27/2020   HGB  13.6 06/27/2020   HCT 39.4 06/27/2020   MCV 96.1 06/27/2020   PLT 239 06/27/2020   Lab Results  Component Value Date   FERRITIN 82 02/04/2019   IRON 77 02/04/2019   TIBC 280 02/04/2019   UIBC 203 02/04/2019   IRONPCTSAT 27 02/04/2019   Lab Results  Component Value Date   RETICCTPCT 1.5 09/06/2012   RBC 4.10 06/27/2020   RETICCTABS 67.7 09/06/2012   No results found for: KPAFRELGTCHN, LAMBDASER, KAPLAMBRATIO No results found for: IGGSERUM, IGA, IGMSERUM No results found for: Odetta Pink, SPEI   Chemistry      Component Value Date/Time   NA 140 06/06/2020 1030   NA 141 07/23/2016 0956   K 4.0 06/06/2020 1030   K 4.2 07/23/2016 0956   CL 105 06/06/2020 1030   CO2 28 06/06/2020 1030   CO2 25 07/23/2016 0956   BUN 15 06/06/2020 1030   BUN 14.6 07/23/2016 0956   CREATININE 0.77 06/06/2020 1030   CREATININE 0.7 07/23/2016 0956      Component Value Date/Time   CALCIUM 9.1 06/06/2020 1030   CALCIUM 9.3 07/23/2016 0956   ALKPHOS 115 06/06/2020 1030   ALKPHOS 97 07/23/2016 0956   AST 26 06/06/2020 1030   AST 25 07/23/2016 0956   ALT 42 06/06/2020 1030   ALT 27 07/23/2016 0956   BILITOT 0.3 06/06/2020 1030   BILITOT 0.30 07/23/2016 0956       Impression and Plan: Kimberly Monroe is a very pleasant 59 yo caucasian female with history of idiopathic PE which has since resolved.   She is followed at [facility] for the astrocytoma.  They are managing her Temodar.  I am glad that she is sticking with [facility].  I know that she is getting very good care out there.  I  know that they are incredibly talented and have a lot of expertise in dealing with primary brain malignancies.  She says her next MRI will be in November.  We will plan to get her back probably after Thanksgiving now.  I am just happy that her blood counts are doing well.  I am happy that she is feeling a little bit better.  Volanda Napoleon, MD 9/22/20218:04 AM

## 2020-06-27 NOTE — Telephone Encounter (Signed)
Appointments scheduled patient has My Chart Access per 9/22 los

## 2020-07-03 ENCOUNTER — Encounter: Payer: Self-pay | Admitting: Hematology & Oncology

## 2020-07-04 ENCOUNTER — Telehealth: Payer: Self-pay | Admitting: Hematology & Oncology

## 2020-07-04 ENCOUNTER — Other Ambulatory Visit: Payer: Self-pay | Admitting: *Deleted

## 2020-07-04 DIAGNOSIS — C4321 Malignant melanoma of right ear and external auricular canal: Secondary | ICD-10-CM

## 2020-07-04 DIAGNOSIS — C711 Malignant neoplasm of frontal lobe: Secondary | ICD-10-CM

## 2020-07-04 NOTE — Telephone Encounter (Signed)
Called and LMVM for patient regarding lab only appointment that has been added per 9/29 sch msg.  I asked patient to call back to confirm this time/date

## 2020-07-05 ENCOUNTER — Inpatient Hospital Stay: Payer: Managed Care, Other (non HMO)

## 2020-07-05 ENCOUNTER — Other Ambulatory Visit: Payer: Self-pay

## 2020-07-05 DIAGNOSIS — C4321 Malignant melanoma of right ear and external auricular canal: Secondary | ICD-10-CM

## 2020-07-05 DIAGNOSIS — C711 Malignant neoplasm of frontal lobe: Secondary | ICD-10-CM

## 2020-07-05 DIAGNOSIS — C713 Malignant neoplasm of parietal lobe: Secondary | ICD-10-CM | POA: Diagnosis not present

## 2020-07-05 LAB — CBC WITH DIFFERENTIAL (CANCER CENTER ONLY)
Abs Immature Granulocytes: 0.02 10*3/uL (ref 0.00–0.07)
Basophils Absolute: 0 10*3/uL (ref 0.0–0.1)
Basophils Relative: 1 %
Eosinophils Absolute: 0.1 10*3/uL (ref 0.0–0.5)
Eosinophils Relative: 1 %
HCT: 41.7 % (ref 36.0–46.0)
Hemoglobin: 14.2 g/dL (ref 12.0–15.0)
Immature Granulocytes: 0 %
Lymphocytes Relative: 17 %
Lymphs Abs: 0.9 10*3/uL (ref 0.7–4.0)
MCH: 33.6 pg (ref 26.0–34.0)
MCHC: 34.1 g/dL (ref 30.0–36.0)
MCV: 98.8 fL (ref 80.0–100.0)
Monocytes Absolute: 0.5 10*3/uL (ref 0.1–1.0)
Monocytes Relative: 10 %
Neutro Abs: 3.8 10*3/uL (ref 1.7–7.7)
Neutrophils Relative %: 71 %
Platelet Count: 141 10*3/uL — ABNORMAL LOW (ref 150–400)
RBC: 4.22 MIL/uL (ref 3.87–5.11)
RDW: 14.6 % (ref 11.5–15.5)
WBC Count: 5.3 10*3/uL (ref 4.0–10.5)
nRBC: 0 % (ref 0.0–0.2)

## 2020-07-05 LAB — CMP (CANCER CENTER ONLY)
ALT: 49 U/L — ABNORMAL HIGH (ref 0–44)
AST: 25 U/L (ref 15–41)
Albumin: 3.9 g/dL (ref 3.5–5.0)
Alkaline Phosphatase: 102 U/L (ref 38–126)
Anion gap: 5 (ref 5–15)
BUN: 14 mg/dL (ref 6–20)
CO2: 32 mmol/L (ref 22–32)
Calcium: 9.8 mg/dL (ref 8.9–10.3)
Chloride: 105 mmol/L (ref 98–111)
Creatinine: 0.86 mg/dL (ref 0.44–1.00)
GFR, Est AFR Am: >60 mL/min (ref >60)
GFR, Estimated: >60 mL/min (ref >60)
Glucose, Bld: 111 mg/dL — ABNORMAL HIGH (ref 70–99)
Potassium: 4.5 mmol/L (ref 3.5–5.1)
Sodium: 142 mmol/L (ref 135–145)
Total Bilirubin: 0.4 mg/dL (ref 0.3–1.2)
Total Protein: 6.7 g/dL (ref 6.5–8.1)

## 2020-07-26 ENCOUNTER — Other Ambulatory Visit: Payer: Self-pay

## 2020-07-26 ENCOUNTER — Other Ambulatory Visit: Payer: Self-pay | Admitting: *Deleted

## 2020-07-26 ENCOUNTER — Inpatient Hospital Stay: Payer: Managed Care, Other (non HMO) | Attending: Hematology & Oncology

## 2020-07-26 ENCOUNTER — Telehealth: Payer: Self-pay | Admitting: *Deleted

## 2020-07-26 ENCOUNTER — Encounter: Payer: Self-pay | Admitting: Hematology & Oncology

## 2020-07-26 DIAGNOSIS — C4321 Malignant melanoma of right ear and external auricular canal: Secondary | ICD-10-CM

## 2020-07-26 DIAGNOSIS — C711 Malignant neoplasm of frontal lobe: Secondary | ICD-10-CM

## 2020-07-26 DIAGNOSIS — D509 Iron deficiency anemia, unspecified: Secondary | ICD-10-CM | POA: Diagnosis not present

## 2020-07-26 DIAGNOSIS — Z923 Personal history of irradiation: Secondary | ICD-10-CM | POA: Insufficient documentation

## 2020-07-26 DIAGNOSIS — C713 Malignant neoplasm of parietal lobe: Secondary | ICD-10-CM | POA: Diagnosis present

## 2020-07-26 DIAGNOSIS — Z85820 Personal history of malignant melanoma of skin: Secondary | ICD-10-CM | POA: Diagnosis not present

## 2020-07-26 DIAGNOSIS — Z7189 Other specified counseling: Secondary | ICD-10-CM

## 2020-07-26 LAB — CMP (CANCER CENTER ONLY)
ALT: 39 U/L (ref 0–44)
AST: 24 U/L (ref 15–41)
Albumin: 4 g/dL (ref 3.5–5.0)
Alkaline Phosphatase: 107 U/L (ref 38–126)
Anion gap: 6 (ref 5–15)
BUN: 11 mg/dL (ref 6–20)
CO2: 30 mmol/L (ref 22–32)
Calcium: 10 mg/dL (ref 8.9–10.3)
Chloride: 103 mmol/L (ref 98–111)
Creatinine: 0.73 mg/dL (ref 0.44–1.00)
GFR, Estimated: >60 mL/min (ref >60)
Glucose, Bld: 97 mg/dL (ref 70–99)
Potassium: 4.1 mmol/L (ref 3.5–5.1)
Sodium: 139 mmol/L (ref 135–145)
Total Bilirubin: 0.6 mg/dL (ref 0.3–1.2)
Total Protein: 7 g/dL (ref 6.5–8.1)

## 2020-07-26 LAB — CBC WITH DIFFERENTIAL (CANCER CENTER ONLY)
Abs Immature Granulocytes: 0.01 10*3/uL (ref 0.00–0.07)
Basophils Absolute: 0 10*3/uL (ref 0.0–0.1)
Basophils Relative: 0 %
Eosinophils Absolute: 0.1 10*3/uL (ref 0.0–0.5)
Eosinophils Relative: 3 %
HCT: 40.7 % (ref 36.0–46.0)
Hemoglobin: 13.8 g/dL (ref 12.0–15.0)
Immature Granulocytes: 0 %
Lymphocytes Relative: 19 %
Lymphs Abs: 1 10*3/uL (ref 0.7–4.0)
MCH: 33.4 pg (ref 26.0–34.0)
MCHC: 33.9 g/dL (ref 30.0–36.0)
MCV: 98.5 fL (ref 80.0–100.0)
Monocytes Absolute: 0.5 10*3/uL (ref 0.1–1.0)
Monocytes Relative: 9 %
Neutro Abs: 3.7 10*3/uL (ref 1.7–7.7)
Neutrophils Relative %: 69 %
Platelet Count: 234 10*3/uL (ref 150–400)
RBC: 4.13 MIL/uL (ref 3.87–5.11)
RDW: 13.8 % (ref 11.5–15.5)
WBC Count: 5.2 10*3/uL (ref 4.0–10.5)
nRBC: 0 % (ref 0.0–0.2)

## 2020-07-26 NOTE — Telephone Encounter (Signed)
Call placed to patient to notify her to come in today for labs d/t pt.'s mychart message stating that she is having chills and bruising, with no fever per order of S. [place] NP.  Pt agreeable to come for labs today at 12:45PM.

## 2020-07-27 LAB — FERRITIN: Ferritin: 105 ng/mL (ref 11–307)

## 2020-07-27 LAB — LACTATE DEHYDROGENASE: LDH: 139 U/L (ref 98–192)

## 2020-07-27 LAB — IRON AND TIBC
Iron: 127 ug/dL (ref 41–142)
Saturation Ratios: 44 % (ref 21–57)
TIBC: 291 ug/dL (ref 236–444)
UIBC: 164 ug/dL (ref 120–384)

## 2020-07-30 ENCOUNTER — Encounter: Payer: Self-pay | Admitting: *Deleted

## 2020-08-22 ENCOUNTER — Encounter: Payer: Self-pay | Admitting: Hematology & Oncology

## 2020-08-22 ENCOUNTER — Telehealth: Payer: Self-pay | Admitting: Family Medicine

## 2020-08-22 ENCOUNTER — Telehealth: Payer: Self-pay | Admitting: *Deleted

## 2020-08-22 NOTE — Telephone Encounter (Signed)
Pt call and want a call back didn't want to tell me anything .

## 2020-08-22 NOTE — Telephone Encounter (Signed)
What is reason for exemption?

## 2020-08-22 NOTE — Telephone Encounter (Signed)
Patients oncologist left.  Patient would like flu exemption.  Is this something Dr Elease Hashimoto is willing to approve?  Let patient know and she will get paperwork to the office.

## 2020-08-22 NOTE — Telephone Encounter (Signed)
Call received from patient requesting that Dr. Marin Olp sign a form exempting pt from the flu vaccine for her job.  Dr. Marin Olp notified.  Patient informed that Dr. Marin Olp would like for pt to contact Dr. Elease Hashimoto for letter.  Pt appreciative of assistance and has no further questions at this time.

## 2020-08-23 NOTE — Telephone Encounter (Signed)
That helps.   I will be happy to complete the forms.

## 2020-08-23 NOTE — Telephone Encounter (Signed)
Patient does not feel like she needs it and is not exposed.  She is a Art therapist.  She does not want to take anything until her MRI and cancer is clear.  She also thinks Guillain barre is a concern.  Patient has also passed out from the vaccine.  Patient does not drive and has been exempt from Covid vaccine.  Patient thinks the vaccine puts her at more risk not getting it than if she gets it.

## 2020-09-06 ENCOUNTER — Inpatient Hospital Stay: Payer: Managed Care, Other (non HMO) | Attending: Hematology & Oncology

## 2020-09-06 ENCOUNTER — Other Ambulatory Visit: Payer: Self-pay

## 2020-09-06 ENCOUNTER — Encounter: Payer: Self-pay | Admitting: Hematology & Oncology

## 2020-09-06 ENCOUNTER — Inpatient Hospital Stay (HOSPITAL_BASED_OUTPATIENT_CLINIC_OR_DEPARTMENT_OTHER): Payer: Managed Care, Other (non HMO) | Admitting: Hematology & Oncology

## 2020-09-06 VITALS — BP 137/80 | HR 60 | Temp 98.3°F | Resp 16 | Wt 231.0 lb

## 2020-09-06 DIAGNOSIS — Z8673 Personal history of transient ischemic attack (TIA), and cerebral infarction without residual deficits: Secondary | ICD-10-CM | POA: Diagnosis not present

## 2020-09-06 DIAGNOSIS — D509 Iron deficiency anemia, unspecified: Secondary | ICD-10-CM | POA: Diagnosis not present

## 2020-09-06 DIAGNOSIS — Z79899 Other long term (current) drug therapy: Secondary | ICD-10-CM | POA: Insufficient documentation

## 2020-09-06 DIAGNOSIS — Z86711 Personal history of pulmonary embolism: Secondary | ICD-10-CM | POA: Insufficient documentation

## 2020-09-06 DIAGNOSIS — Z7901 Long term (current) use of anticoagulants: Secondary | ICD-10-CM | POA: Diagnosis not present

## 2020-09-06 DIAGNOSIS — Z923 Personal history of irradiation: Secondary | ICD-10-CM | POA: Insufficient documentation

## 2020-09-06 DIAGNOSIS — C711 Malignant neoplasm of frontal lobe: Secondary | ICD-10-CM

## 2020-09-06 DIAGNOSIS — C4322 Malignant melanoma of left ear and external auricular canal: Secondary | ICD-10-CM | POA: Diagnosis not present

## 2020-09-06 DIAGNOSIS — C713 Malignant neoplasm of parietal lobe: Secondary | ICD-10-CM | POA: Insufficient documentation

## 2020-09-06 LAB — CBC WITH DIFFERENTIAL (CANCER CENTER ONLY)
Abs Immature Granulocytes: 0.02 10*3/uL (ref 0.00–0.07)
Basophils Absolute: 0 10*3/uL (ref 0.0–0.1)
Basophils Relative: 1 %
Eosinophils Absolute: 0.1 10*3/uL (ref 0.0–0.5)
Eosinophils Relative: 2 %
HCT: 41 % (ref 36.0–46.0)
Hemoglobin: 13.9 g/dL (ref 12.0–15.0)
Immature Granulocytes: 0 %
Lymphocytes Relative: 23 %
Lymphs Abs: 1.3 10*3/uL (ref 0.7–4.0)
MCH: 33.2 pg (ref 26.0–34.0)
MCHC: 33.9 g/dL (ref 30.0–36.0)
MCV: 97.9 fL (ref 80.0–100.0)
Monocytes Absolute: 0.5 10*3/uL (ref 0.1–1.0)
Monocytes Relative: 9 %
Neutro Abs: 3.8 10*3/uL (ref 1.7–7.7)
Neutrophils Relative %: 65 %
Platelet Count: 253 10*3/uL (ref 150–400)
RBC: 4.19 MIL/uL (ref 3.87–5.11)
RDW: 13.1 % (ref 11.5–15.5)
WBC Count: 5.8 10*3/uL (ref 4.0–10.5)
nRBC: 0 % (ref 0.0–0.2)

## 2020-09-06 LAB — CMP (CANCER CENTER ONLY)
ALT: 42 U/L (ref 0–44)
AST: 26 U/L (ref 15–41)
Albumin: 3.8 g/dL (ref 3.5–5.0)
Alkaline Phosphatase: 109 U/L (ref 38–126)
Anion gap: 5 (ref 5–15)
BUN: 12 mg/dL (ref 6–20)
CO2: 30 mmol/L (ref 22–32)
Calcium: 9.5 mg/dL (ref 8.9–10.3)
Chloride: 105 mmol/L (ref 98–111)
Creatinine: 0.8 mg/dL (ref 0.44–1.00)
GFR, Estimated: >60 mL/min (ref >60)
Glucose, Bld: 123 mg/dL — ABNORMAL HIGH (ref 70–99)
Potassium: 4 mmol/L (ref 3.5–5.1)
Sodium: 140 mmol/L (ref 135–145)
Total Bilirubin: 0.4 mg/dL (ref 0.3–1.2)
Total Protein: 6.5 g/dL (ref 6.5–8.1)

## 2020-09-06 LAB — LACTATE DEHYDROGENASE: LDH: 127 U/L (ref 98–192)

## 2020-09-06 NOTE — Progress Notes (Signed)
Hematology and Oncology Follow Up Visit  Kimberly Monroe [id number] Mar 07, 1961 59 y.o. 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  She is doing pretty well.  She has completed the Temodar.  She is quite happy about this.  A couple weeks ago, her husband had a heart attack.  Thankfully he survived.  He had 2 stents placed.  She does feel little bit tired.  She has a little bit of crampiness in her hands and legs.  We will have to see what her labs show.  She has had no problems with bowels or bladder.  There is no diarrhea.  She has had no cough.  There is no shortness of breath.  She has had no rashes.  She had a very nice Thanksgiving with her family.  Overall, her performance status is ECOG 1.   Medications:  Allergies as of 09/06/2020      Reactions   Lime Flavor [flavoring Agent] Anaphylaxis   Amoxicillin Other (See Comments)   Patient stated,"my mom told me to never take it. I don't even know what type of reaction."   Penicillins Other (See Comments)   Did it involve swelling of the face/tongue/throat, SOB, or low BP? Unknown Did it involve sudden or severe rash/hives, skin peeling, or any reaction on the inside of your mouth or nose? Unknown Did you need to seek medical attention at a hospital or doctor's office? Unknown When did it last happen? Childhood allergy If all above answers are "NO", may proceed with cephalosporin use. Patient stated,"I was told not to take it."      Medication List       Accurate as of September 06, 2020  8:04 AM. If you have any questions, ask your nurse or doctor.        amLODipine 2.5 MG  tablet Commonly known as: NORVASC TAKE 1 TABLET (2.5 MG TOTAL) BY MOUTH DAILY. PLEASE SCHEDULE FOLLOW UP FOR REFILLS.   atorvastatin 20 MG tablet Commonly known as: LIPITOR Take 1 tablet (20 mg total) by mouth daily.   cyanocobalamin 1000 MCG tablet Take by mouth daily.   DSS 100 MG Caps Take by mouth daily.   lisinopril 10 MG tablet Commonly known as: ZESTRIL Take 1 tablet (10 mg total) by mouth daily. Please schedule follow up for refills.   memantine 10 MG tablet Commonly known as: NAMENDA Take 10 mg by mouth 2 (two) times daily.   ondansetron 4 MG disintegrating tablet Commonly known as: ZOFRAN-ODT Take 4 mg by mouth every 8 (eight) hours as needed.   ondansetron 8 MG disintegrating tablet Commonly known as: ZOFRAN-ODT Take 8 mg by mouth 3 (three) times daily.   temozolomide 180 MG capsule Commonly known as: TEMODAR 180 mg 5 days per month.   temozolomide 250 MG capsule Commonly known as: TEMODAR 1 cap + 1 cap x 180 mg (total dose 430 mg) PO 30 min after zofran @ [facility] for 5 consecutive days every 28 days   Vitamin D (Ergocalciferol) 1.25 MG (50000 UNIT) Caps capsule Commonly known as: DRISDOL TAKE 1 CAPSULE BY MOUTH ONE TIME PER WEEK   Xarelto  20 MG Tabs tablet Generic drug: rivaroxaban TAKE 1 TABLET (20 MG TOTAL) BY MOUTH DAILY WITH SUPPER.       Allergies:  Allergies  Allergen Reactions  . Lime Flavor [Flavoring Agent] Anaphylaxis  . Amoxicillin Other (See Comments)    Patient stated,"my mom told me to never take it. I don't even know what type of reaction."  . Penicillins Other (See Comments)    Did it involve swelling of the face/tongue/throat, SOB, or low BP? Unknown Did it involve sudden or severe rash/hives, skin peeling, or any reaction on the inside of your mouth or nose? Unknown Did you need to seek medical attention at a hospital or doctor's office? Unknown When did it last happen? Childhood allergy If all above answers are "NO", may  proceed with cephalosporin use.   Patient stated,"I was told not to take it."     Past Medical History, Surgical history, Social history, and Family History were reviewed and updated.  Review of Systems: Review of Systems  Constitutional: Negative.   Eyes: Negative.   Respiratory: Negative.   Cardiovascular: Negative.   Gastrointestinal: Negative.   Genitourinary: Negative.   Musculoskeletal: Negative.   Skin: Negative.   Neurological: Negative.   Endo/Heme/Allergies: Negative.   Psychiatric/Behavioral: Negative.      Physical Exam:  vitals were not taken for this visit.   Wt Readings from Last 3 Encounters:  06/27/20 231 lb 8 oz (105 kg)  04/26/20 (!) 230 lb (104.3 kg)  01/19/20 231 lb (104.8 kg)    Physical Exam Vitals reviewed.  HENT:     Head: Normocephalic and atraumatic.     Comments: On examination of her cranium, she has a healing craniectomy scar in the left parietal area.  This is healing nicely.    Ears:     Comments: She has a dressing on the left earlobe.  There is no adenopathy in the neck. Eyes:     Pupils: Pupils are equal, round, and reactive to light.  Neck:     Comments: She has a dressing in the left supraclavicular region where the skin graft was taken. Cardiovascular:     Rate and Rhythm: Normal rate and regular rhythm.     Heart sounds: Normal heart sounds.  Pulmonary:     Effort: Pulmonary effort is normal.     Breath sounds: Normal breath sounds.  Abdominal:     General: Bowel sounds are normal.     Palpations: Abdomen is soft.  Musculoskeletal:        General: No tenderness or deformity. Normal range of motion.     Cervical back: Normal range of motion.  Lymphadenopathy:     Cervical: No cervical adenopathy.  Skin:    General: Skin is warm and dry.     Findings: No erythema or rash.  Neurological:     Mental Status: She is alert and oriented to person, place, and time.  Psychiatric:        Behavior: Behavior normal.         Thought Content: Thought content normal.        Judgment: Judgment normal.      Lab Results  Component Value Date   WBC 5.8 09/06/2020   HGB 13.9 09/06/2020   HCT 41.0 09/06/2020   MCV 97.9 09/06/2020   PLT 253 09/06/2020   Lab Results  Component Value Date   FERRITIN 105 07/26/2020   IRON 127 07/26/2020   TIBC 291 07/26/2020   UIBC 164 07/26/2020  IRONPCTSAT 44 07/26/2020   Lab Results  Component Value Date   RETICCTPCT 1.5 09/06/2012   RBC 4.19 09/06/2020   RETICCTABS 67.7 09/06/2012   No results found for: KPAFRELGTCHN, LAMBDASER, KAPLAMBRATIO No results found for: IGGSERUM, IGA, IGMSERUM No results found for: Odetta Pink, SPEI   Chemistry      Component Value Date/Time   NA 139 07/26/2020 1245   NA 141 07/23/2016 0956   K 4.1 07/26/2020 1245   K 4.2 07/23/2016 0956   CL 103 07/26/2020 1245   CO2 30 07/26/2020 1245   CO2 25 07/23/2016 0956   BUN 11 07/26/2020 1245   BUN 14.6 07/23/2016 0956   CREATININE 0.73 07/26/2020 1245   CREATININE 0.7 07/23/2016 0956      Component Value Date/Time   CALCIUM 10.0 07/26/2020 1245   CALCIUM 9.3 07/23/2016 0956   ALKPHOS 107 07/26/2020 1245   ALKPHOS 97 07/23/2016 0956   AST 24 07/26/2020 1245   AST 25 07/23/2016 0956   ALT 39 07/26/2020 1245   ALT 27 07/23/2016 0956   BILITOT 0.6 07/26/2020 1245   BILITOT 0.30 07/23/2016 0956       Impression and Plan: Ms. Cowell is a very pleasant 59 yo caucasian female with history of idiopathic PE which has since resolved.   She is followed at [facility] for the astrocytoma.  They were managing her Temodar.  She is scheduled for a MRI in early January.  Again we will have to see what her electrolytes show.  Not much sure why she has these cramps.  I am just happy that she is finished the Temodar.  I think she will feel better.  We will plan to get her back now in 3 months.   Volanda Napoleon, MD 12/2/20218:04 AM

## 2020-09-17 ENCOUNTER — Other Ambulatory Visit: Payer: Self-pay

## 2020-09-17 DIAGNOSIS — Z86711 Personal history of pulmonary embolism: Secondary | ICD-10-CM

## 2020-09-17 DIAGNOSIS — I639 Cerebral infarction, unspecified: Secondary | ICD-10-CM

## 2020-09-17 MED ORDER — RIVAROXABAN 20 MG PO TABS: ORAL_TABLET | ORAL | 1 refills | Status: DC

## 2020-10-16 ENCOUNTER — Encounter: Payer: Self-pay | Admitting: Hematology & Oncology

## 2020-10-16 ENCOUNTER — Telehealth: Payer: Self-pay | Admitting: Hematology & Oncology

## 2020-10-16 NOTE — Telephone Encounter (Signed)
Called and spoke with patient regarding appointments added per 1/11 sch msg 

## 2020-10-17 ENCOUNTER — Other Ambulatory Visit: Payer: Self-pay | Admitting: Hematology & Oncology

## 2020-10-17 DIAGNOSIS — E559 Vitamin D deficiency, unspecified: Secondary | ICD-10-CM

## 2020-11-06 ENCOUNTER — Inpatient Hospital Stay: Payer: 59 | Admitting: Hematology & Oncology

## 2020-11-06 ENCOUNTER — Encounter: Payer: Self-pay | Admitting: Hematology & Oncology

## 2020-11-06 ENCOUNTER — Inpatient Hospital Stay: Payer: 59 | Attending: Hematology & Oncology

## 2020-11-06 ENCOUNTER — Other Ambulatory Visit: Payer: Self-pay

## 2020-11-06 VITALS — BP 164/90 | HR 58 | Temp 98.2°F | Resp 18 | Wt 231.0 lb

## 2020-11-06 DIAGNOSIS — D509 Iron deficiency anemia, unspecified: Secondary | ICD-10-CM | POA: Insufficient documentation

## 2020-11-06 DIAGNOSIS — Z79899 Other long term (current) drug therapy: Secondary | ICD-10-CM | POA: Diagnosis not present

## 2020-11-06 DIAGNOSIS — C4322 Malignant melanoma of left ear and external auricular canal: Secondary | ICD-10-CM | POA: Insufficient documentation

## 2020-11-06 DIAGNOSIS — Z8673 Personal history of transient ischemic attack (TIA), and cerebral infarction without residual deficits: Secondary | ICD-10-CM | POA: Insufficient documentation

## 2020-11-06 DIAGNOSIS — Z923 Personal history of irradiation: Secondary | ICD-10-CM | POA: Diagnosis not present

## 2020-11-06 DIAGNOSIS — C711 Malignant neoplasm of frontal lobe: Secondary | ICD-10-CM

## 2020-11-06 DIAGNOSIS — Z86711 Personal history of pulmonary embolism: Secondary | ICD-10-CM | POA: Diagnosis not present

## 2020-11-06 DIAGNOSIS — Z7901 Long term (current) use of anticoagulants: Secondary | ICD-10-CM | POA: Insufficient documentation

## 2020-11-06 DIAGNOSIS — C713 Malignant neoplasm of parietal lobe: Secondary | ICD-10-CM | POA: Diagnosis present

## 2020-11-06 LAB — CBC WITH DIFFERENTIAL (CANCER CENTER ONLY)
Abs Immature Granulocytes: 0.01 10*3/uL (ref 0.00–0.07)
Basophils Absolute: 0.1 10*3/uL (ref 0.0–0.1)
Basophils Relative: 1 %
Eosinophils Absolute: 0.1 10*3/uL (ref 0.0–0.5)
Eosinophils Relative: 2 %
HCT: 44.1 % (ref 36.0–46.0)
Hemoglobin: 14.7 g/dL (ref 12.0–15.0)
Immature Granulocytes: 0 %
Lymphocytes Relative: 22 %
Lymphs Abs: 1.2 10*3/uL (ref 0.7–4.0)
MCH: 31.9 pg (ref 26.0–34.0)
MCHC: 33.3 g/dL (ref 30.0–36.0)
MCV: 95.7 fL (ref 80.0–100.0)
Monocytes Absolute: 0.4 10*3/uL (ref 0.1–1.0)
Monocytes Relative: 8 %
Neutro Abs: 3.7 10*3/uL (ref 1.7–7.7)
Neutrophils Relative %: 67 %
Platelet Count: 259 10*3/uL (ref 150–400)
RBC: 4.61 MIL/uL (ref 3.87–5.11)
RDW: 12.5 % (ref 11.5–15.5)
WBC Count: 5.5 10*3/uL (ref 4.0–10.5)
nRBC: 0 % (ref 0.0–0.2)

## 2020-11-06 LAB — CMP (CANCER CENTER ONLY)
ALT: 39 U/L (ref 0–44)
AST: 26 U/L (ref 15–41)
Albumin: 4.3 g/dL (ref 3.5–5.0)
Alkaline Phosphatase: 110 U/L (ref 38–126)
Anion gap: 7 (ref 5–15)
BUN: 14 mg/dL (ref 6–20)
CO2: 32 mmol/L (ref 22–32)
Calcium: 10.1 mg/dL (ref 8.9–10.3)
Chloride: 104 mmol/L (ref 98–111)
Creatinine: 0.85 mg/dL (ref 0.44–1.00)
GFR, Estimated: >60 mL/min (ref >60)
Glucose, Bld: 106 mg/dL — ABNORMAL HIGH (ref 70–99)
Potassium: 4.4 mmol/L (ref 3.5–5.1)
Sodium: 143 mmol/L (ref 135–145)
Total Bilirubin: 0.5 mg/dL (ref 0.3–1.2)
Total Protein: 6.9 g/dL (ref 6.5–8.1)

## 2020-11-06 LAB — LACTATE DEHYDROGENASE: LDH: 128 U/L (ref 98–192)

## 2020-11-06 MED ORDER — FLUCONAZOLE 200 MG PO TABS: 200.0000 mg | ORAL_TABLET | Freq: Every day | ORAL | 0 refills | Status: DC

## 2020-11-06 NOTE — Progress Notes (Signed)
Hematology and Oncology Follow Up Visit  Kimberly Monroe [id number] 1961/02/25 60 y.o. 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  Unfortunately, she is having problems with her cognition.  I think a lot of this is from her treatments and the fact that she had a recent MRI of the brain which showed that there might be a recurrence in the area of her tumor resection.  She had MRI done at [facility].  There was increased intensity surrounding the resection site.  The radiologist was not sure if this was tumor necrosis or if this was recurrent tumor.  Another MRIs can be done in a week or so.  She really is having a hard time with her thinking.  She is a little bit weak.  She has a hard time going up and down stairs.  She cannot drive anymore.  She is helped by her husband quite a bit.  Thankfully she has not fallen because he has been with her to catch her if she starts to become unsteady.    She has had no cough.  There has been no shortness of breath.  She has some generalized weakness.  I think is can be very difficult for her to work.  Again I still think she has a mental capacity to be able to work and work for any period of time.  She is seeing a Electrical engineer at [facility].  She has had no problems with her bowels or bladder.  She has had no obvious incontinence.  She does complain of some Candida in the intertriginous areas.  She does have erythema under the breasts, more so on the right side than the left.  There is some Candida in her inguinal regions.  She has had a little bit of a cough.  She does get tired quite easily.  Overall, I  would say her performance status right now is at best, ECOG 2-3.   Medications:  Allergies as of 11/06/2020      Reactions   Lime Flavor [flavoring Agent] Anaphylaxis   Amoxicillin Other (See Comments)   Patient stated,"my mom told me to never take it. I don't even know what type of reaction."   Penicillins Other (See Comments)   Did it involve swelling of the face/tongue/throat, SOB, or low BP? Unknown Did it involve sudden or severe rash/hives, skin peeling, or any reaction on the inside of your mouth or nose? Unknown Did you need to seek medical attention at a hospital or doctor's office? Unknown When did it last happen? Childhood allergy If all above answers are "NO", may proceed with cephalosporin use. Patient stated,"I was told not to take it."      Medication List       Accurate as of November 06, 2020 11:57 AM. If you have any questions, ask your nurse or doctor.        amLODipine 2.5 MG tablet Commonly known as: NORVASC TAKE 1 TABLET (2.5 MG TOTAL) BY MOUTH DAILY. PLEASE SCHEDULE FOLLOW UP FOR REFILLS.   atorvastatin 20 MG tablet Commonly known as: LIPITOR Take 1 tablet (20 mg total)  by mouth daily.   cyanocobalamin 1000 MCG tablet Take by mouth daily.   DSS 100 MG Caps Take by mouth daily.   lisinopril 10 MG tablet Commonly known as: ZESTRIL Take 1 tablet (10 mg total) by mouth daily. Please schedule follow up for refills.   memantine 10 MG tablet Commonly known as: NAMENDA Take 10 mg by mouth 2 (two) times daily.   ondansetron 4 MG disintegrating tablet Commonly known as: ZOFRAN-ODT Take 4 mg by mouth every 8 (eight) hours as needed.   ondansetron 8 MG disintegrating tablet Commonly known as: ZOFRAN-ODT Take 8 mg by mouth 3 (three) times daily.   rivaroxaban 20 MG Tabs tablet Commonly known as: Xarelto TAKE 1 TABLET (20 MG TOTAL) BY MOUTH DAILY WITH SUPPER.   temozolomide 180 MG capsule Commonly known as: TEMODAR 180 mg 5 days per month.    temozolomide 250 MG capsule Commonly known as: TEMODAR 1 cap + 1 cap x 180 mg (total dose 430 mg) PO 30 min after zofran @ [facility] for 5 consecutive days every 28 days   Vitamin D (Ergocalciferol) 1.25 MG (50000 UNIT) Caps capsule Commonly known as: DRISDOL TAKE 1 CAPSULE BY MOUTH ONE TIME PER WEEK       Allergies:  Allergies  Allergen Reactions  . Lime Flavor [Flavoring Agent] Anaphylaxis  . Amoxicillin Other (See Comments)    Patient stated,"my mom told me to never take it. I don't even know what type of reaction."  . Penicillins Other (See Comments)    Did it involve swelling of the face/tongue/throat, SOB, or low BP? Unknown Did it involve sudden or severe rash/hives, skin peeling, or any reaction on the inside of your mouth or nose? Unknown Did you need to seek medical attention at a hospital or doctor's office? Unknown When did it last happen? Childhood allergy If all above answers are "NO", may proceed with cephalosporin use.   Patient stated,"I was told not to take it."     Past Medical History, Surgical history, Social history, and Family History were reviewed and updated.  Review of Systems: Review of Systems  Constitutional: Negative.   Eyes: Negative.   Respiratory: Negative.   Cardiovascular: Negative.   Gastrointestinal: Negative.   Genitourinary: Negative.   Musculoskeletal: Negative.   Skin: Negative.   Neurological: Negative.   Endo/Heme/Allergies: Negative.   Psychiatric/Behavioral: Negative.      Physical Exam:  weight is 231 lb (104.8 kg). Her oral temperature is 98.2 F (36.8 C). Her blood pressure is 164/90 (abnormal) and her pulse is 58 (abnormal). Her respiration is 18 and oxygen saturation is 99%.   Wt Readings from Last 3 Encounters:  11/06/20 231 lb (104.8 kg)  09/06/20 231 lb (104.8 kg)  06/27/20 231 lb 8 oz (105 kg)    Physical Exam Vitals reviewed.  HENT:     Head: Normocephalic and atraumatic.     Comments: On examination  of her cranium, she has a healing craniectomy scar in the left parietal area.  This is healing nicely.    Ears:     Comments: She has a dressing on the left earlobe.  There is no adenopathy in the neck. Eyes:     Pupils: Pupils are equal, round, and reactive to light.  Neck:     Comments: She has a dressing in the left supraclavicular region where the skin graft was taken. Cardiovascular:     Rate and Rhythm: Normal rate and regular rhythm.     Heart sounds:  Normal heart sounds.  Pulmonary:     Effort: Pulmonary effort is normal.     Breath sounds: Normal breath sounds.  Abdominal:     General: Bowel sounds are normal.     Palpations: Abdomen is soft.  Musculoskeletal:        General: No tenderness or deformity. Normal range of motion.     Cervical back: Normal range of motion.  Lymphadenopathy:     Cervical: No cervical adenopathy.  Skin:    General: Skin is warm and dry.     Findings: No erythema or rash.  Neurological:     Mental Status: She is alert.     Comments: Neurological exam shows some decrease in her cognitive function.  She has quite a while to figure out what to say.  She has to talk slowly.  She is okay had word finding but again it does take a while.  She has some slight symmetrical decreased strength in her legs.  Her cranial nerves seem to be intact.  Psychiatric:     Comments: She has little bit of a flat affect.      Lab Results  Component Value Date   WBC 5.5 11/06/2020   HGB 14.7 11/06/2020   HCT 44.1 11/06/2020   MCV 95.7 11/06/2020   PLT 259 11/06/2020   Lab Results  Component Value Date   FERRITIN 105 07/26/2020   IRON 127 07/26/2020   TIBC 291 07/26/2020   UIBC 164 07/26/2020   IRONPCTSAT 44 07/26/2020   Lab Results  Component Value Date   RETICCTPCT 1.5 09/06/2012   RBC 4.61 11/06/2020   RETICCTABS 67.7 09/06/2012   No results found for: KPAFRELGTCHN, LAMBDASER, KAPLAMBRATIO No results found for: Kandis Cocking, IGMSERUM No results  found for: Odetta Pink, SPEI   Chemistry      Component Value Date/Time   NA 143 11/06/2020 1106   NA 141 07/23/2016 0956   K 4.4 11/06/2020 1106   K 4.2 07/23/2016 0956   CL 104 11/06/2020 1106   CO2 32 11/06/2020 1106   CO2 25 07/23/2016 0956   BUN 14 11/06/2020 1106   BUN 14.6 07/23/2016 0956   CREATININE 0.85 11/06/2020 1106   CREATININE 0.7 07/23/2016 0956      Component Value Date/Time   CALCIUM 10.1 11/06/2020 1106   CALCIUM 9.3 07/23/2016 0956   ALKPHOS 110 11/06/2020 1106   ALKPHOS 97 07/23/2016 0956   AST 26 11/06/2020 1106   AST 25 07/23/2016 0956   ALT 39 11/06/2020 1106   ALT 27 07/23/2016 0956   BILITOT 0.5 11/06/2020 1106   BILITOT 0.30 07/23/2016 0956       Impression and Plan: Ms. Happ is a very pleasant 60 yo caucasian female with history of idiopathic PE which has since resolved.   She is followed at [facility] for the astrocytoma.  They were managing her Temodar.  She is scheduled for a MRI in early January.  I do feel bad about this decline in her cognitive function.  She would like to work but I doubt that she will be able to.  She just does not have the mental capacity to be able to function for any period of time.  I am sure that the neuropsychologist will provide a much more formal evaluation.  It will be interesting to see what the next MRI shows.  We will go ahead and put her on some Diflucan to try to help  with this Candida that she has.  I know that she is getting wonderful care down at [facility].  We are always here for her since she lives close to [place] and it is a lot easier for her to get here then to travel to [facility], particularly now that she has this declining cognitive function.  She has to rely on friends and family for travel.    I would like to see her back in about a month.   Volanda Napoleon, MD 2/1/202211:57 AM

## 2020-11-23 ENCOUNTER — Ambulatory Visit: Payer: 59 | Admitting: Family Medicine

## 2020-11-23 ENCOUNTER — Other Ambulatory Visit: Payer: Self-pay

## 2020-11-23 VITALS — BP 130/78 | HR 64 | Temp 98.4°F | Resp 18 | Ht 64.0 in | Wt 186.8 lb

## 2020-11-23 DIAGNOSIS — E785 Hyperlipidemia, unspecified: Secondary | ICD-10-CM

## 2020-11-23 DIAGNOSIS — I10 Essential (primary) hypertension: Secondary | ICD-10-CM

## 2020-11-23 LAB — HEPATIC FUNCTION PANEL
ALT: 33 U/L (ref 0–35)
AST: 20 U/L (ref 0–37)
Albumin: 3.9 g/dL (ref 3.5–5.2)
Alkaline Phosphatase: 117 U/L (ref 39–117)
Bilirubin, Direct: 0.1 mg/dL (ref 0.0–0.3)
Total Bilirubin: 0.4 mg/dL (ref 0.2–1.2)
Total Protein: 6.3 g/dL (ref 6.0–8.3)

## 2020-11-23 LAB — LIPID PANEL
Cholesterol: 136 mg/dL (ref 0–200)
HDL: 45.1 mg/dL (ref >39.00)
LDL Cholesterol: 73 mg/dL (ref 0–99)
NonHDL: 91.27
Total CHOL/HDL Ratio: 3
Triglycerides: 92 mg/dL (ref 0.0–149.0)
VLDL: 18.4 mg/dL (ref 0.0–40.0)

## 2020-11-23 NOTE — Progress Notes (Signed)
 Established Patient Office Visit  Subjective:  Patient ID: Kimberly Monroe, female    DOB: 02/27/1961  Age: 59 y.o. MRN: [id number]  CC:  Chief Complaint  Patient presents with  . Follow-up    HPI Kimberly Monroe presents for medical follow-up.  She has astrocytoma of the frontal lobe followed by [facility].  She had a couple recent MRI scans.  She had one in February which showed question of lacunar infarct left thalamus region.  She is not any focal weakness.  No speech changes.  She does have some cognitive impairment.  Their concern is having her follow-up here to reassess blood pressure and also lipids.  She takes Lipitor 20 mg daily.  Last lipids were last April.  Denies any side effect from medication.  Takes lisinopril 10 mg daily for hypertension.  At 1 point she was prescribed low-dose amlodipine but never started.  She is actually getting higher readings at home with some readings as high as 160 systolic.  This is with automated cuff.  No alcohol use.  Tries to watch sodium intake.  No peripheral edema.  Past Medical History:  Diagnosis Date  . Allergy    seasonal  . Anemia   . Anxiety   . 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 Basal cell cancer   . Cognitive changes   . Depression   . Goals of care, counseling/discussion 11/02/2019  . History of blood clotting disorder   . Hx of cerebral artery stenosis   . Hypertension   . Melanoma (HCC) 2009   insitu  . Menorrhagia 02/10/2013  . Migraine    history of/none in years  . Ovarian cancer (HCC) 1995   left ovary  . Pulmonary embolism (HCC)   . Squamous cell carcinoma   . Stroke (HCC)     Past Surgical History:  Procedure Laterality Date  . ABDOMINAL HYSTERECTOMY  03/06/2013  . BREAST DUCTAL SYSTEM EXCISION  2009   L intraductal papilloma  . CEREBRAL ANGIOGRAM  2020  . CESAREAN SECTION  1996, 2000   twins, singleton  . IR ANGIO INTRA EXTRACRAN SEL COM CAROTID INNOMINATE BILAT MOD SED  11/05/2018  . IR  ANGIO VERTEBRAL SEL SUBCLAVIAN INNOMINATE UNI L MOD SED  11/05/2018  . IR ANGIO VERTEBRAL SEL VERTEBRAL UNI R MOD SED  11/05/2018  . IR US GUIDE VASC ACCESS RIGHT  11/05/2018  . laparoscopy with ovarian cystectomy  1994   ovarian torsion  . 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 MOHS SURGERY  2009   Dr Albertini    Family History  Problem Relation Age of Onset  . Cancer Father 70       colon cancer  . Colon cancer Father   . Breast cancer Mother 48  . Cancer Paternal Uncle        prostate  . Stroke Sister 60    Social History   Socioeconomic History  . Marital status: Married    Spouse name: Not on file  . Number of children: 3  . Years of education: college  . Highest education level: Not on file  Occupational History  . Occupation: physical therapist  Tobacco Use  . Smoking status: Never Smoker  . Smokeless tobacco: Never Used  . Tobacco comment: never used cigarettes  Vaping Use  . Vaping Use: Never used  Substance and Sexual Activity  . Alcohol use: Yes    Alcohol/week: 0.0 standard drinks      Comment: wine-occassionally  . Drug use: No  . Sexual activity: Not on file  Other Topics Concern  . Not on file  Social History Narrative   Lives at home with husband and family.   Right-handed.   Caffeine use:  2-3 cups some days.   Social Determinants of Health   Financial Resource Strain: Not on file  Food Insecurity: Not on file  Transportation Needs: Not on file  Physical Activity: Not on file  Stress: Not on file  Social Connections: Not on file  Intimate Partner Violence: Not on file    Outpatient Medications Prior to Visit  Medication Sig Dispense Refill  . atorvastatin (LIPITOR) 20 MG tablet Take 1 tablet (20 mg total) by mouth daily. 90 tablet 3  . cyanocobalamin 1000 MCG tablet Take by mouth daily.     . Docusate Sodium (DSS) 100 MG CAPS Take by mouth daily.     . fluconazole (DIFLUCAN) 200 MG tablet Take 1 tablet (200 mg total) by mouth daily. 10  tablet 0  . lisinopril (ZESTRIL) 10 MG tablet Take 1 tablet (10 mg total) by mouth daily. Please schedule follow up for refills. 90 tablet 3  . memantine (NAMENDA) 10 MG tablet Take 10 mg by mouth 2 (two) times daily.    . ondansetron (ZOFRAN-ODT) 4 MG disintegrating tablet Take 4 mg by mouth every 8 (eight) hours as needed.    . ondansetron (ZOFRAN-ODT) 8 MG disintegrating tablet Take 8 mg by mouth 3 (three) times daily.    . rivaroxaban (XARELTO) 20 MG TABS tablet TAKE 1 TABLET (20 MG TOTAL) BY MOUTH DAILY WITH SUPPER. 90 tablet 1  . temozolomide (TEMODAR) 180 MG capsule 180 mg 5 days per month.    . temozolomide (TEMODAR) 250 MG capsule 1 cap + 1 cap x 180 mg (total dose 430 mg) PO 30 min after zofran @ [facility] for 5 consecutive days every 28 days    . Vitamin D, Ergocalciferol, (DRISDOL) 1.25 MG (50000 UNIT) CAPS capsule TAKE 1 CAPSULE BY MOUTH ONE TIME PER WEEK 12 capsule 2  . amLODipine (NORVASC) 2.5 MG tablet TAKE 1 TABLET (2.5 MG TOTAL) BY MOUTH DAILY. PLEASE SCHEDULE FOLLOW UP FOR REFILLS. (Patient not taking: Reported on 11/23/2020) 90 tablet 1   No facility-administered medications prior to visit.    Allergies  Allergen Reactions  . Lime Flavor [Flavoring Agent] Anaphylaxis  . Amoxicillin Other (See Comments)    Patient stated,"my mom told me to never take it. I don't even know what type of reaction."  . Penicillins Other (See Comments)    Did it involve swelling of the face/tongue/throat, SOB, or low BP? Unknown Did it involve sudden or severe rash/hives, skin peeling, or any reaction on the inside of your mouth or nose? Unknown Did you need to seek medical attention at a hospital or doctor's office? Unknown When did it last happen? Childhood allergy If all above answers are "NO", may proceed with cephalosporin use.   Patient stated,"I was told not to take it."     ROS Review of Systems  Eyes: Negative for visual disturbance.  Respiratory: Negative for cough, chest  tightness, shortness of breath and wheezing.   Cardiovascular: Negative for chest pain, palpitations and leg swelling.  Neurological: Negative for seizures, syncope and weakness.      Objective:    Physical Exam Constitutional:      Appearance: She is well-developed and well-nourished.  Eyes:     Pupils: Pupils are equal, round,   and reactive to light.  Neck:     Thyroid: No thyromegaly.     Vascular: No JVD.  Cardiovascular:     Rate and Rhythm: Normal rate and regular rhythm.     Heart sounds: No gallop.   Pulmonary:     Effort: Pulmonary effort is normal. No respiratory distress.     Breath sounds: Normal breath sounds. No wheezing or rales.  Musculoskeletal:        General: No edema.     Cervical back: Neck supple.  Neurological:     Mental Status: She is alert.     BP 130/78   Pulse 64   Temp 98.4 F (36.9 C)   Resp 18   Ht 5' 4" (1.626 m)   Wt 186 lb 12.8 oz (84.7 kg)   LMP 01/24/2013   SpO2 98%   BMI 32.06 kg/m  Wt Readings from Last 3 Encounters:  11/23/20 186 lb 12.8 oz (84.7 kg)  11/06/20 231 lb (104.8 kg)  09/06/20 231 lb (104.8 kg)     Health Maintenance Due  Topic Date Due  . Hepatitis C Screening  Never done  . COVID-19 Vaccine (1) Never done  . TETANUS/TDAP  Never done  . MAMMOGRAM  11/19/2013  . PAP SMEAR-Modifier  03/20/2015  . COLONOSCOPY (Pts 45-49yrs Insurance coverage will need to be confirmed)  01/28/2017    There are no preventive care reminders to display for this patient.  Lab Results  Component Value Date   TSH 2.28 04/15/2019   Lab Results  Component Value Date   WBC 5.5 11/06/2020   HGB 14.7 11/06/2020   HCT 44.1 11/06/2020   MCV 95.7 11/06/2020   PLT 259 11/06/2020   Lab Results  Component Value Date   NA 143 11/06/2020   K 4.4 11/06/2020   CHLORIDE 107 07/23/2016   CO2 32 11/06/2020   GLUCOSE 106 (H) 11/06/2020   BUN 14 11/06/2020   CREATININE 0.85 11/06/2020   BILITOT 0.5 11/06/2020   ALKPHOS 110  11/06/2020   AST 26 11/06/2020   ALT 39 11/06/2020   PROT 6.9 11/06/2020   ALBUMIN 4.3 11/06/2020   CALCIUM 10.1 11/06/2020   ANIONGAP 7 11/06/2020   EGFR >90 07/23/2016   GFR 72.46 01/11/2020   Lab Results  Component Value Date   CHOL 138 01/11/2020   Lab Results  Component Value Date   HDL 45.10 01/11/2020   Lab Results  Component Value Date   LDLCALC 72 01/11/2020   Lab Results  Component Value Date   TRIG 102.0 01/11/2020   Lab Results  Component Value Date   CHOLHDL 3 01/11/2020   Lab Results  Component Value Date   HGBA1C 6.0 (H) 11/05/2018      Assessment & Plan:   #1 hypertension.  Goal blood pressure less than 130/80 with prior history of lacunar infarct.  They will monitor with regular cuff at home and she has someone with her today he was in a nurse who can help them with monitoring accurately at home and be in touch if consistently greater than 130/80  -Consider increasing lisinopril 20 mg daily for elevated blood pressure  #2 history of dyslipidemia.  Goal LDL less than 70.  Patient on Lipitor.  -Recheck lipid and hepatic panel. -Consider increase Lipitor to 40 mg daily for LDL greater than 70  No orders of the defined types were placed in this encounter.   Follow-up: No follow-ups on file.    Bruce Burchette,   MD

## 2020-11-23 NOTE — Patient Instructions (Signed)
Monitor blood pressure and let me know if consistently > 130/80.  Our goals for BP are < 130/80 and < 70 LDL cholesterol.

## 2020-11-26 ENCOUNTER — Telehealth: Payer: Self-pay

## 2020-11-26 MED ORDER — ATORVASTATIN CALCIUM 40 MG PO TABS: 40.0000 mg | ORAL_TABLET | Freq: Every day | ORAL | 3 refills | Status: DC

## 2020-11-26 NOTE — Telephone Encounter (Signed)
-----   Message from Eulas Post, MD sent at 11/25/2020  2:27 PM EST ----- Liver panel normal.   LDL cholesterol 73.  With hx of CVA, recommend goal LDL < 70..   increase Lipitor to 40 mg po qd.

## 2020-11-26 NOTE — Telephone Encounter (Signed)
Spoke with patient and discussed medication increase. Refill sent.

## 2020-12-05 ENCOUNTER — Inpatient Hospital Stay: Payer: 59 | Attending: Hematology & Oncology

## 2020-12-05 ENCOUNTER — Telehealth: Payer: Self-pay

## 2020-12-05 ENCOUNTER — Encounter: Payer: Self-pay | Admitting: Hematology & Oncology

## 2020-12-05 ENCOUNTER — Inpatient Hospital Stay (HOSPITAL_BASED_OUTPATIENT_CLINIC_OR_DEPARTMENT_OTHER): Payer: 59 | Admitting: Hematology & Oncology

## 2020-12-05 ENCOUNTER — Other Ambulatory Visit: Payer: Self-pay

## 2020-12-05 VITALS — BP 130/85 | HR 62 | Temp 97.6°F | Resp 18 | Ht 64.0 in | Wt 227.8 lb

## 2020-12-05 DIAGNOSIS — Z923 Personal history of irradiation: Secondary | ICD-10-CM | POA: Diagnosis not present

## 2020-12-05 DIAGNOSIS — C173 Meckel's diverticulum, malignant: Secondary | ICD-10-CM | POA: Diagnosis not present

## 2020-12-05 DIAGNOSIS — D509 Iron deficiency anemia, unspecified: Secondary | ICD-10-CM | POA: Insufficient documentation

## 2020-12-05 DIAGNOSIS — Z79899 Other long term (current) drug therapy: Secondary | ICD-10-CM | POA: Insufficient documentation

## 2020-12-05 DIAGNOSIS — Z86711 Personal history of pulmonary embolism: Secondary | ICD-10-CM | POA: Diagnosis not present

## 2020-12-05 DIAGNOSIS — C713 Malignant neoplasm of parietal lobe: Secondary | ICD-10-CM | POA: Diagnosis present

## 2020-12-05 DIAGNOSIS — C711 Malignant neoplasm of frontal lobe: Secondary | ICD-10-CM

## 2020-12-05 DIAGNOSIS — Z8673 Personal history of transient ischemic attack (TIA), and cerebral infarction without residual deficits: Secondary | ICD-10-CM | POA: Insufficient documentation

## 2020-12-05 DIAGNOSIS — Z85820 Personal history of malignant melanoma of skin: Secondary | ICD-10-CM | POA: Diagnosis not present

## 2020-12-05 DIAGNOSIS — Z7901 Long term (current) use of anticoagulants: Secondary | ICD-10-CM | POA: Diagnosis not present

## 2020-12-05 LAB — CMP (CANCER CENTER ONLY)
ALT: 32 U/L (ref 0–44)
AST: 21 U/L (ref 15–41)
Albumin: 4 g/dL (ref 3.5–5.0)
Alkaline Phosphatase: 103 U/L (ref 38–126)
Anion gap: 7 (ref 5–15)
BUN: 10 mg/dL (ref 6–20)
CO2: 30 mmol/L (ref 22–32)
Calcium: 9.6 mg/dL (ref 8.9–10.3)
Chloride: 104 mmol/L (ref 98–111)
Creatinine: 0.81 mg/dL (ref 0.44–1.00)
GFR, Estimated: >60 mL/min (ref >60)
Glucose, Bld: 116 mg/dL — ABNORMAL HIGH (ref 70–99)
Potassium: 3.7 mmol/L (ref 3.5–5.1)
Sodium: 141 mmol/L (ref 135–145)
Total Bilirubin: 0.6 mg/dL (ref 0.3–1.2)
Total Protein: 6.4 g/dL — ABNORMAL LOW (ref 6.5–8.1)

## 2020-12-05 LAB — CBC WITH DIFFERENTIAL (CANCER CENTER ONLY)
Abs Immature Granulocytes: 0.02 10*3/uL (ref 0.00–0.07)
Basophils Absolute: 0.1 10*3/uL (ref 0.0–0.1)
Basophils Relative: 1 %
Eosinophils Absolute: 0.1 10*3/uL (ref 0.0–0.5)
Eosinophils Relative: 3 %
HCT: 43.1 % (ref 36.0–46.0)
Hemoglobin: 14.7 g/dL (ref 12.0–15.0)
Immature Granulocytes: 0 %
Lymphocytes Relative: 30 %
Lymphs Abs: 1.7 10*3/uL (ref 0.7–4.0)
MCH: 31.2 pg (ref 26.0–34.0)
MCHC: 34.1 g/dL (ref 30.0–36.0)
MCV: 91.5 fL (ref 80.0–100.0)
Monocytes Absolute: 0.6 10*3/uL (ref 0.1–1.0)
Monocytes Relative: 10 %
Neutro Abs: 3.1 10*3/uL (ref 1.7–7.7)
Neutrophils Relative %: 56 %
Platelet Count: 242 10*3/uL (ref 150–400)
RBC: 4.71 MIL/uL (ref 3.87–5.11)
RDW: 12.4 % (ref 11.5–15.5)
WBC Count: 5.6 10*3/uL (ref 4.0–10.5)
nRBC: 0 % (ref 0.0–0.2)

## 2020-12-05 LAB — LACTATE DEHYDROGENASE: LDH: 115 U/L (ref 98–192)

## 2020-12-05 NOTE — Telephone Encounter (Signed)
appts made per 12/05/20 los   Kimberly Monroe

## 2020-12-05 NOTE — Progress Notes (Signed)
Hematology and Oncology Follow Up Visit  Kimberly Monroe [id number] Dec 09, 1960 60 y.o. 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 It appears that she may have had a stroke. This may account for the cognitive issues. She had an MRI on 11/12/2020. This did not show any evidence of recurrent malignancy. There was a area of infarct in the thalamus.  She is on Xarelto. She is on therapeutic Xarelto.  She said that the doctor is increased her Lipitor.  She does feel a little bit better.  She is trying to stay active. She has had no problems with cough or shortness of breath. There have been no nausea or vomiting. She has had no change in bowel or bladder habits. She has had no weakness. There is been no visual changes.  Overall, I would say performance status is ECOG 1.   Medications:  Allergies as of 12/05/2020      Reactions   Lime Flavor [flavoring Agent] Anaphylaxis   Amoxicillin Other (See Comments)   Patient stated,"my mom told me to never take it. I don't even know what type of reaction."   Penicillins Other (See Comments)   Did it involve swelling of the face/tongue/throat, SOB, or low BP? Unknown Did it involve sudden or severe rash/hives, skin peeling, or any reaction on the inside of your mouth or nose? Unknown Did you need to seek medical attention at a hospital or doctor's office? Unknown When did it last happen? Childhood allergy If all above answers are "NO", may proceed with cephalosporin use. Patient stated,"I was told not to take it."      Medication List       Accurate as of December 05, 2020  8:09 AM. If you have any questions, ask  your nurse or doctor.        atorvastatin 40 MG tablet Commonly known as: LIPITOR Take 1 tablet (40 mg total) by mouth daily.   cyanocobalamin 1000 MCG tablet Take by mouth daily.   DSS 100 MG Caps Take by mouth daily.   fluconazole 200 MG tablet Commonly known as: Diflucan Take 1 tablet (200 mg total) by mouth daily.   lisinopril 10 MG tablet Commonly known as: ZESTRIL Take 1 tablet (10 mg total) by mouth daily. Please schedule follow up for refills.   memantine 10 MG tablet Commonly known as: NAMENDA Take 10 mg by mouth 2 (two) times daily.   ondansetron 4 MG disintegrating tablet Commonly known as: ZOFRAN-ODT Take 4 mg by mouth every 8 (eight) hours as needed.   ondansetron 8 MG disintegrating tablet Commonly known as: ZOFRAN-ODT Take 8 mg by mouth 3 (three) times daily.   rivaroxaban 20 MG Tabs tablet Commonly known as: Xarelto TAKE 1 TABLET (20 MG TOTAL) BY MOUTH DAILY WITH SUPPER.   temozolomide 180 MG capsule Commonly known as: TEMODAR 180 mg 5 days per month.   temozolomide 250 MG capsule Commonly known as: TEMODAR 1 cap + 1 cap x 180 mg (total dose 430 mg) PO 30 min after zofran @ [facility] for 5 consecutive days every 28 days   Vitamin D (Ergocalciferol) 1.25 MG (50000 UNIT)  Caps capsule Commonly known as: DRISDOL TAKE 1 CAPSULE BY MOUTH ONE TIME PER WEEK       Allergies:  Allergies  Allergen Reactions  . Lime Flavor [Flavoring Agent] Anaphylaxis  . Amoxicillin Other (See Comments)    Patient stated,"my mom told me to never take it. I don't even know what type of reaction."  . Penicillins Other (See Comments)    Did it involve swelling of the face/tongue/throat, SOB, or low BP? Unknown Did it involve sudden or severe rash/hives, skin peeling, or any reaction on the inside of your mouth or nose? Unknown Did you need to seek medical attention at a hospital or doctor's office? Unknown When did it last happen? Childhood allergy If all above  answers are "NO", may proceed with cephalosporin use.   Patient stated,"I was told not to take it."     Past Medical History, Surgical history, Social history, and Family History were reviewed and updated.  Review of Systems: Review of Systems  Constitutional: Negative.   Eyes: Negative.   Respiratory: Negative.   Cardiovascular: Negative.   Gastrointestinal: Negative.   Genitourinary: Negative.   Musculoskeletal: Negative.   Skin: Negative.   Neurological: Negative.   Endo/Heme/Allergies: Negative.   Psychiatric/Behavioral: Negative.      Physical Exam:  vitals were not taken for this visit.   Wt Readings from Last 3 Encounters:  11/23/20 186 lb 12.8 oz (84.7 kg)  11/06/20 231 lb (104.8 kg)  09/06/20 231 lb (104.8 kg)    Physical Exam Vitals reviewed.  HENT:     Head: Normocephalic and atraumatic.     Comments: On examination of her cranium, she has a healing craniectomy scar in the left parietal area.  This is healing nicely.    Ears:     Comments: She has a dressing on the left earlobe.  There is no adenopathy in the neck. Eyes:     Pupils: Pupils are equal, round, and reactive to light.  Neck:     Comments: She has a dressing in the left supraclavicular region where the skin graft was taken. Cardiovascular:     Rate and Rhythm: Normal rate and regular rhythm.     Heart sounds: Normal heart sounds.  Pulmonary:     Effort: Pulmonary effort is normal.     Breath sounds: Normal breath sounds.  Abdominal:     General: Bowel sounds are normal.     Palpations: Abdomen is soft.  Musculoskeletal:        General: No tenderness or deformity. Normal range of motion.     Cervical back: Normal range of motion.  Lymphadenopathy:     Cervical: No cervical adenopathy.  Skin:    General: Skin is warm and dry.     Findings: No erythema or rash.  Neurological:     Mental Status: She is alert.     Comments: Neurological exam shows some decrease in her cognitive  function.  She has quite a while to figure out what to say.  She has to talk slowly.  She is okay had word finding but again it does take a while.  She has some slight symmetrical decreased strength in her legs.  Her cranial nerves seem to be intact.  Psychiatric:     Comments: She has little bit of a flat affect.      Lab Results  Component Value Date   WBC 5.6 12/05/2020   HGB 14.7 12/05/2020   HCT 43.1 12/05/2020   MCV  91.5 12/05/2020   PLT 242 12/05/2020   Lab Results  Component Value Date   FERRITIN 105 07/26/2020   IRON 127 07/26/2020   TIBC 291 07/26/2020   UIBC 164 07/26/2020   IRONPCTSAT 44 07/26/2020   Lab Results  Component Value Date   RETICCTPCT 1.5 09/06/2012   RBC 4.71 12/05/2020   RETICCTABS 67.7 09/06/2012   No results found for: KPAFRELGTCHN, LAMBDASER, KAPLAMBRATIO No results found for: IGGSERUM, IGA, IGMSERUM No results found for: Odetta Pink, SPEI   Chemistry      Component Value Date/Time   NA 143 11/06/2020 1106   NA 141 07/23/2016 0956   K 4.4 11/06/2020 1106   K 4.2 07/23/2016 0956   CL 104 11/06/2020 1106   CO2 32 11/06/2020 1106   CO2 25 07/23/2016 0956   BUN 14 11/06/2020 1106   BUN 14.6 07/23/2016 0956   CREATININE 0.85 11/06/2020 1106   CREATININE 0.7 07/23/2016 0956      Component Value Date/Time   CALCIUM 10.1 11/06/2020 1106   CALCIUM 9.3 07/23/2016 0956   ALKPHOS 117 11/23/2020 0837   ALKPHOS 97 07/23/2016 0956   AST 20 11/23/2020 0837   AST 26 11/06/2020 1106   AST 25 07/23/2016 0956   ALT 33 11/23/2020 0837   ALT 39 11/06/2020 1106   ALT 27 07/23/2016 0956   BILITOT 0.4 11/23/2020 0837   BILITOT 0.5 11/06/2020 1106   BILITOT 0.30 07/23/2016 0956       Impression and Plan: Ms. Kouns is a very pleasant 60 yo caucasian female with history of idiopathic PE which has since resolved.   She is followed at [facility] for the astrocytoma.  They were managing her  Temodar.  She thinks that she goes back in April. I suspect that they will do another MRI at that time.  Hopefully, she will continue to have some improvement in her cognitive functioning.  At least, there is no evidence of recurrent astrocytoma.  I will plan to get her back to see [place] in 3 months now.  Volanda Napoleon, MD 3/2/20228:09 AM

## 2020-12-06 LAB — IGG, IGA, IGM
IgA: 294 mg/dL (ref 87–352)
IgG (Immunoglobin G), Serum: 840 mg/dL (ref 586–1602)
IgM (Immunoglobulin M), Srm: 70 mg/dL (ref 26–217)

## 2020-12-18 ENCOUNTER — Other Ambulatory Visit: Payer: Self-pay

## 2020-12-18 ENCOUNTER — Encounter: Payer: Self-pay | Admitting: Physical Therapy

## 2020-12-18 ENCOUNTER — Ambulatory Visit: Payer: 59 | Attending: Specialist | Admitting: Physical Therapy

## 2020-12-18 DIAGNOSIS — M25611 Stiffness of right shoulder, not elsewhere classified: Secondary | ICD-10-CM | POA: Insufficient documentation

## 2020-12-18 DIAGNOSIS — M6281 Muscle weakness (generalized): Secondary | ICD-10-CM | POA: Insufficient documentation

## 2020-12-18 DIAGNOSIS — R2681 Unsteadiness on feet: Secondary | ICD-10-CM | POA: Diagnosis present

## 2020-12-18 NOTE — Patient Instructions (Signed)
Access Code: [email or web address] URL: [email or web address] Date: 12/18/2020 Prepared by: Almyra Free  Exercises Seated Cervical Rotation AROM - 2 x daily - 7 x weekly - 1 sets - 5 reps - 5 sec hold Seated Cervical Sidebending AROM - 2 x daily - 7 x weekly - 1 sets - 5 reps - 5 sec hold Sit to Stand with Counter Support - 2-3 x daily - 7 x weekly - 1 sets - 5 reps Standing Shoulder Internal Rotation Stretch with Hands Behind Back - 2 x daily - 7 x weekly - 1 sets - 2 reps - 30 sec hold Standing Balance in Corner - 2 x daily - 7 x weekly - 1 sets - 5 reps - max hold

## 2020-12-18 NOTE — Therapy (Signed)
[facility] [address] [place], [place], [address] Phone: [phone]   Fax:  [phone]  Physical Therapy Evaluation  Patient Details  Name: Kimberly Monroe MRN: [id number] Date of Birth: [birth year] Referring Provider (PT): Malachy Chamber MD   Encounter Date: 12/18/2020   PT End of Session - 12/18/20 0757    Visit Number 1    Date for PT Re-Evaluation 02/12/21    Authorization Type U[employer]    PT Start Time 0800    PT Stop Time 0848    PT Time Calculation (min) 48 min    Activity Tolerance Patient tolerated treatment well;Other (comment)   limited by environment   Behavior During Therapy [facility] for tasks assessed/performed           Past Medical History:  Diagnosis Date  . Allergy    seasonal  . Anemia   . Anxiety   . Astrocytoma of frontal lobe ([place]) 11/02/2019  . Basal cell cancer   . Cognitive changes   . Depression   . Goals of care, counseling/discussion 11/02/2019  . History of blood clotting disorder   . Hx of cerebral artery stenosis   . Hypertension   . Melanoma ([place]) 2009   insitu  . Menorrhagia 02/10/2013  . Migraine    history of/none in years  . Ovarian cancer ([place]) 1995   left ovary  . Pulmonary embolism ([place])   . Squamous cell carcinoma   . Stroke [facility])     Past Surgical History:  Procedure Laterality Date  . ABDOMINAL HYSTERECTOMY  03/06/2013  . BREAST DUCTAL SYSTEM EXCISION  2009   L intraductal papilloma  . CEREBRAL ANGIOGRAM  2020  . [place]   twins, singleton  . IR ANGIO INTRA EXTRACRAN SEL COM CAROTID INNOMINATE BILAT MOD SED  11/05/2018  . IR ANGIO VERTEBRAL SEL SUBCLAVIAN INNOMINATE UNI L MOD SED  11/05/2018  . IR ANGIO VERTEBRAL SEL VERTEBRAL UNI R MOD SED  11/05/2018  . IR US GUIDE VASC ACCESS RIGHT  11/05/2018  . laparoscopy with ovarian cystectomy  1994   ovarian torsion  . 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 MOHS SURGERY  2009   Dr Link Snuffer     There were no vitals filed for this visit.    Subjective Assessment - 12/18/20 0800    Subjective Patinet reports she has brain cancer and also had a CVA (thalamic) at the end of Jan beginning of Feb 2022. Patient with hypersensitivity to input (people/lighting). She has fallen several times. She does not climb stairs. Uses downstairs bedroom. I want to be safe getting around. can't lift right arm OH, limited cervical ext makes pt dizzy.    Pertinent History brain cancer, cva, headaches, stenosis, dizziness    Limitations Lifting;Walking    Patient Stated Goals to be safe getting around    Currently in Pain? No/denies              [facility] PT Assessment - 12/18/20 0001      Assessment   Medical Diagnosis Diffuse astrocytoma    Referring Provider (PT) Malachy Chamber MD    Onset Date/Surgical Date 10/04/19    Hand Dominance Right    Next MD Visit April    Prior Therapy no      Precautions   Precautions Fall    Precaution Comments multiple falls      Restrictions   Weight Bearing Restrictions No  Balance Screen   Has the patient fallen in the past 6 months Yes    How many times? 5    Has the patient had a decrease in activity level because of a fear of falling?  Yes    Is the patient reluctant to leave their home because of a fear of falling?  Yes      [place] Private residence    Living Arrangements Spouse/significant other    Available Help at Discharge Family    Type of [place] to enter    Entrance Stairs-Number of Steps 3    Entrance Stairs-Rails Right   when entering   [place] Two level;Able to live on main level with bedroom/bathroom    Home Equipment None      Prior Function   Level of Independence Independent    Vocation Retired      [employer]   Attention Focused    Memory Appears intact    Awareness Appears intact      Functional Tests   Functional tests Sit to Stand      Sit to Stand    Comments uses LUE and table for assist; 5x sit to stand 54.02      Posture/Postural Control   Posture/Postural Control Postural limitations    Postural Limitations Rounded Shoulders;Forward head      ROM / Strength   AROM / PROM / Strength Strength;AROM      AROM   Overall AROM Comments cervical rot 45 deg approx bil, flex full, SB WFL but tight, ext limted by dizziness, Left shoulder WFL, Right shoulder flex/abd to approx 90 deg; needs assist from left to get behind her head, IR full behind back      Strength   Overall Strength Comments right shoulder flex/abd 3/5, IR/ER 3-/5; left shoulder 4+/5-5/5; RLE cogwheel 3+/5, LLE 5/5      Ambulation/Gait   Ambulation/Gait Yes    Ambulation/Gait Assistance 7: Independent    Ambulation Distance (Feet) 10 Feet    Assistive device None    Gait Pattern Wide base of support;Decreased hip/knee flexion - right;Decreased hip/knee flexion - left    Ambulation Surface Level    Gait Comments Brightness of floors and lights affect gait      Standardized Balance Assessment   Standardized Balance Assessment Berg Balance Test;Timed Up and Go Test      Berg Balance Test   Sit to Stand Able to stand  independently using hands    Standing Unsupported Able to stand 30 seconds unsupported    Sitting with Back Unsupported but Feet Supported on Floor or Stool Able to sit safely and securely 2 minutes    Stand to Sit Sits safely with minimal use of hands    Transfers Able to transfer safely, definite need of hands    Standing Unsupported with Eyes Closed Able to stand 3 seconds    Standing Unsupported with Feet Together Able to place feet together independently but unable to hold for 30 seconds    From Standing, Reach Forward with Outstretched Arm Reaches forward but needs supervision    From Standing Position, Pick up Object from Floor Unable to try/needs assist to keep balance    From Standing Position, Turn to Look Behind Over each Shoulder Looks behind  one side only/other side shows less weight shift    Turn 360 Degrees Able to turn 360 degrees safely but slowly  Standing Unsupported, Alternately Place Feet on Step/Stool --    Standing on One Leg Unable to try or needs assist to prevent fall    Berg comment: BERG completed to pt's ability today; to be completed next visit      Timed Up and Go Test   TUG Comments modified 6 ft; 19.27 sec and hand to wall when turning (done in room due to glare of floor)                      Objective measurements completed on examination: See above findings.               PT Education - 12/18/20 1245    Education Details HEP    Person(s) Educated Patient    Methods Explanation;Demonstration;Handout    Comprehension Verbalized understanding;Returned demonstration            PT Short Term Goals - 12/18/20 1313      PT SHORT TERM GOAL #1   Title Ind with initial HEP    Time 4    Period Weeks    Status New    Target Date 01/15/21      PT SHORT TERM GOAL #2   Title Patient compliant with use of appropriate AD at home and in the community to prevent further falls.    Time 2    Period Weeks    Status New    Target Date 01/01/21      PT SHORT TERM GOAL #3   Title Improved 5x sit to stand to 30 seconds    Time 4    Period Weeks    Status New             PT Long Term Goals - 12/18/20 1314      PT LONG TERM GOAL #1   Title Ind with advanced HEP to maintain gains in strength and ROM    Time 8    Period Weeks    Status New    Target Date 02/12/21      PT LONG TERM GOAL #2   Title Improved nomal TUG(on carpet if tolerated) with AD to <= 10 sec to decrease fall risk    Time 8    Period Weeks    Status New      PT LONG TERM GOAL #3   Title Improved 5x sit to stand to < 20 sec to decrease likelihood of falls    Time 8    Period Weeks    Status New      PT LONG TERM GOAL #4   Title Improved BERG score to 40/56 to decrease likelihood of falls.    Time 8     Period Weeks    Status New      PT LONG TERM GOAL #5   Title Improve right UE flexion to >= 120 deg to improve function of Rt UE    Time 8    Period Weeks    Status New                  Plan - 12/18/20 1256    Clinical Impression Statement Patient presents today with reports of weakness and balance issues due to brain cancer and recent CVA. She uses no AD, but was advised based on testing that a RW would be appropriate in the community and at home. She would like to try a cane at home as she uses furniture now and  has concerns about the ability to maneuver a RW at home. She also reports dizziness with cervcial extension and is hypersensitive to the brightness of the tile floor and lights. She has difficulty with focus in a busy and/or loud environment as well. She has had multiple falls (at least 5) in the past 6 months. She has weakness in both her right UE and LE with cogwheel response to MMT. Her left side ROM and strength is WFL. BERG balance assessment was partially completed with a score of 26/56 but PT anticipates score of remaining components will not increase score to above 30. Modified TUG and 5x sit to stand times also indicated significant fall risk. Pt will benefit from skilled PT to address these deficits with goal of decreasing patient's fall risk and improving her mobiity.    Personal Factors and Comorbidities Comorbidity 3+    Comorbidities brain cancer, cva, headaches, stenosis, dizziness    Examination-Activity Limitations Locomotion Level;Stairs    Stability/Clinical Decision Making Evolving/Moderate complexity    Clinical Decision Making Moderate    Rehab Potential Good    PT Frequency 2x / week    PT Duration 8 weeks    PT Treatment/Interventions ADLs/Self Care Home Management;Neuromuscular re-education;Balance training;Therapeutic exercise;Therapeutic activities;Stair training;Gait training;Patient/family education;Manual techniques;Dry needling    PT Next  Visit Plan PATIENT NEEDS TO BE TREATED IN A ROOM WITH DIM LIGHTING: complete BERG ( 2 items), work on sit to stand, functional strength; gait with ADs (maybe outside?), balance; Left UE ROM/strength if important to pt.    PT Home Exercise Plan [email or web address]    Consulted and Agree with Plan of Care Patient           Patient will benefit from skilled therapeutic intervention in order to improve the following deficits and impairments:  Abnormal gait,Decreased range of motion,Impaired UE functional use,Decreased balance,Decreased strength,Postural dysfunction,Impaired flexibility,Dizziness  Visit Diagnosis: Unsteadiness on feet  Muscle weakness (generalized)  Stiffness of right shoulder, not elsewhere classified     Problem List Patient Active Problem List   Diagnosis Date Noted  . Hyperlipidemia 01/11/2020  . Astrocytoma of frontal lobe ([place]) 11/02/2019  . Goals of care, counseling/discussion 11/02/2019  . Abnormal brain MRI 12/07/2018  . Confusion 12/07/2018  . TIA (transient ischemic attack) 11/04/2018  . CVA (cerebral vascular accident) ([place]) 03/29/2018  . Essential hypertension 06/20/2016  . Suicidal ideation 09/20/2013  . MDD (major depressive disorder) 09/20/2013  . Menorrhagia 02/10/2013  . Pulmonary embolism ([place]) 09/09/2012  . History of pulmonary embolism 09/06/2012  . Acute respiratory failure with hypoxia ([place]) 02/04/2012  . Bilateral pulmonary embolism ([place]) 02/04/2012  . Hypokalemia 02/04/2012  . Hyponatremia 02/04/2012  . Elevation of cardiac enzymes 02/04/2012  . Family History of hypercoagulable state 02/04/2012  . History of squamous cell carcinoma of skin 12/17/2011  . History  of basal cell carcinoma 12/17/2011  . Depression 12/17/2011  . Iron deficiency anemia - severe 12/17/2011  . Melanoma ([place])     Madelyn Flavors PT 12/18/2020, 1:22 PM  [facility] [address] [place], [place],  [address] Phone: [phone]   Fax:  [phone]  Name: Kimberly Monroe MRN: [id number] Date of Birth: Feb 11, 1961

## 2020-12-20 ENCOUNTER — Ambulatory Visit: Payer: 59 | Admitting: Physical Therapy

## 2020-12-20 ENCOUNTER — Other Ambulatory Visit: Payer: Self-pay

## 2020-12-20 ENCOUNTER — Encounter: Payer: Self-pay | Admitting: Physical Therapy

## 2020-12-20 DIAGNOSIS — R2681 Unsteadiness on feet: Secondary | ICD-10-CM | POA: Diagnosis not present

## 2020-12-20 DIAGNOSIS — M6281 Muscle weakness (generalized): Secondary | ICD-10-CM

## 2020-12-20 DIAGNOSIS — M25611 Stiffness of right shoulder, not elsewhere classified: Secondary | ICD-10-CM

## 2020-12-20 NOTE — Therapy (Signed)
[facility] [address], [place], [place], [address] Phone: [phone]   Fax:  [phone]  Physical Therapy Treatment  Patient Details  Name: Kimberly Monroe MRN: [id number] Date of Birth: 1961/07/19 Referring Provider (PT): Malachy Chamber MD   Encounter Date: 12/20/2020   PT End of Session - 12/20/20 0758    Visit Number 2    Date for PT Re-Evaluation 02/12/21    Authorization Type UHC    PT Start Time 0758    PT Stop Time 0838    PT Time Calculation (min) 40 min    Activity Tolerance Patient tolerated treatment well;Other (comment)   environment limitations   Behavior During Therapy [facility] for tasks assessed/performed           Past Medical History:  Diagnosis Date  . Allergy    seasonal  . Anemia   . Anxiety   . 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 Basal cell cancer   . Cognitive changes   . Depression   . Goals of care, counseling/discussion 11/02/2019  . History of blood clotting disorder   . Hx of cerebral artery stenosis   . Hypertension   . Melanoma ([place]) 2009   insitu  . Menorrhagia 02/10/2013  . Migraine    history of/none in years  . Ovarian cancer ([place]) 1995   left ovary  . Pulmonary embolism ([place])   . Squamous cell carcinoma   . Stroke [facility])     Past Surgical History:  Procedure Laterality Date  . ABDOMINAL HYSTERECTOMY  03/06/2013  . BREAST DUCTAL SYSTEM EXCISION  2009   L intraductal papilloma  . CEREBRAL ANGIOGRAM  2020  . [place]   twins, singleton  . IR ANGIO INTRA EXTRACRAN SEL COM CAROTID INNOMINATE BILAT MOD SED  11/05/2018  . IR ANGIO VERTEBRAL SEL SUBCLAVIAN INNOMINATE UNI L MOD SED  11/05/2018  . IR ANGIO VERTEBRAL SEL VERTEBRAL UNI R MOD SED  11/05/2018  . IR US GUIDE VASC ACCESS RIGHT  11/05/2018  . laparoscopy with ovarian cystectomy  1994   ovarian torsion  . 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 MOHS SURGERY  2009   Dr Link Snuffer     There were no vitals filed for this visit.       [facility] PT Assessment - 12/20/20 0001      Berg Balance Test   Sit to Stand Able to stand  independently using hands    Standing Unsupported Able to stand 30 seconds unsupported    Sitting with Back Unsupported but Feet Supported on Floor or Stool Able to sit safely and securely 2 minutes    Stand to Sit Sits safely with minimal use of hands    Transfers Able to transfer safely, definite need of hands    Standing Unsupported with Eyes Closed Able to stand 3 seconds    Standing Unsupported with Feet Together Able to place feet together independently but unable to hold for 30 seconds    From Standing, Reach Forward with Outstretched Arm Reaches forward but needs supervision    From Standing Position, Pick up Object from Floor Unable to try/needs assist to keep balance    From Standing Position, Turn to Look Behind Over each Shoulder Looks behind one side only/other side shows less weight shift    Turn 360 Degrees Able to turn 360 degrees safely but slowly    Standing Unsupported, Alternately Place Feet on  Step/Stool Needs assistance to keep from falling or unable to try    Standing Unsupported, One Foot in [place] help to step but can hold 15 seconds    Standing on One Leg Unable to try or needs assist to prevent fall    Total Score 27    Berg comment: BERG demonstrates significant risk of falls, gets fatigued easily and needed breaks during just 1-2 items                         [facility] Adult PT Treatment/Exercise - 12/20/20 0001      Ambulation/Gait   Ambulation/Gait Yes    Ambulation/Gait Assistance 6: Modified independent (Device/Increase time)    Ambulation Surface Level    Gait Comments walking in room with RW and cane; RW from room to exit - able to avoid obstacle, slow gait due to vision disturbances      Exercises   Exercises Knee/Hip      Knee/Hip Exercises: Seated   Sit to Sand 1 set;5 reps;with UE  support   hands press on seat then stand without support     Knee/Hip Exercises: Sidelying   Clams 2x10                  PT Education - 12/20/20 0921    Education Details [id number]    Person(s) Educated Patient    Methods Explanation;Demonstration    Comprehension Verbalized understanding;Returned demonstration            PT Short Term Goals - 12/18/20 1313      PT SHORT TERM GOAL #1   Title Ind with initial HEP    Time 4    Period Weeks    Status New    Target Date 01/15/21      PT SHORT TERM GOAL #2   Title Patient compliant with use of appropriate AD at home and in the community to prevent further falls.    Time 2    Period Weeks    Status New    Target Date 01/01/21      PT SHORT TERM GOAL #3   Title Improved 5x sit to stand to 30 seconds    Time 4    Period Weeks    Status New             PT Long Term Goals - 12/18/20 1314      PT LONG TERM GOAL #1   Title Ind with advanced HEP to maintain gains in strength and ROM    Time 8    Period Weeks    Status New    Target Date 02/12/21      PT LONG TERM GOAL #2   Title Improved nomal TUG(on carpet if tolerated) with AD to <= 10 sec to decrease fall risk    Time 8    Period Weeks    Status New      PT LONG TERM GOAL #3   Title Improved 5x sit to stand to < 20 sec to decrease likelihood of falls    Time 8    Period Weeks    Status New      PT LONG TERM GOAL #4   Title Improved BERG score to 40/56 to decrease likelihood of falls.    Time 8    Period Weeks    Status New      PT LONG TERM GOAL #5   Title Improve right UE  flexion to >= 120 deg to improve function of Rt UE    Time 8    Period Weeks    Status New                 Plan - 12/20/20 0841    Clinical Impression Statement Today's session was review of exercises and finished the BERG for assessment of balance.  Pt has final score of 27/56. She is constantly needing to touch something for better awareness of where she is in  space during the treatment.  pt did much better with the rolling walker when leaving. She still has to go slowly due to the lights glaringon the floor.  Pt was given info on medical supply for rolling walker.  pt will benefit from skilled PT to work on LE strength and balance.    PT Treatment/Interventions ADLs/Self Care Home Management;Neuromuscular re-education;Balance training;Therapeutic exercise;Therapeutic activities;Stair training;Gait training;Patient/family education;Manual techniques;Dry needling    PT Next Visit Plan PATIENT NEEDS TO BE TREATED IN A ROOM WITH DIM LIGHTING (possibly use lighting in order to challenge her balance in upcoming sessions), work on sit to stand, functional strength; gait with ADs (maybe outside?), balance; Left UE ROM/strength if important to pt.    PT Home Exercise Plan [id number]    Consulted and Agree with Plan of Care Patient           Patient will benefit from skilled therapeutic intervention in order to improve the following deficits and impairments:  Abnormal gait,Decreased range of motion,Impaired UE functional use,Decreased balance,Decreased strength,Postural dysfunction,Impaired flexibility,Dizziness  Visit Diagnosis: Unsteadiness on feet  Muscle weakness (generalized)  Stiffness of right shoulder, not elsewhere classified     Problem List Patient Active Problem List   Diagnosis Date Noted  . Hyperlipidemia 01/11/2020  . 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 Goals of care, counseling/discussion 11/02/2019  . Abnormal brain MRI 12/07/2018  . Confusion 12/07/2018  . TIA (transient ischemic attack) 11/04/2018  . CVA (cerebral vascular accident) ([place]) 03/29/2018  . Essential hypertension 06/20/2016  . Suicidal ideation 09/20/2013  . MDD (major depressive disorder) 09/20/2013  . Menorrhagia 02/10/2013  . Pulmonary embolism ([place]) 09/09/2012  . History of pulmonary embolism 09/06/2012  . Acute respiratory failure with hypoxia  ([place]) 02/04/2012  . Bilateral pulmonary embolism ([place]) 02/04/2012  . Hypokalemia 02/04/2012  . Hyponatremia 02/04/2012  . Elevation of cardiac enzymes 02/04/2012  . Family History of hypercoagulable state 02/04/2012  . History of squamous cell carcinoma of skin 12/17/2011  . History  of basal cell carcinoma 12/17/2011  . Depression 12/17/2011  . Iron deficiency anemia - severe 12/17/2011  . Melanoma ([place])     Jule Ser, PT 12/20/2020, 9:27 AM  [facility] [address] [place], [place], [address] Phone: [phone]   Fax:  [phone]  Name: JAHNASIA TATUM MRN: [id number] Date of Birth: 1961-04-28

## 2020-12-25 ENCOUNTER — Ambulatory Visit: Payer: 59 | Admitting: Physical Therapy

## 2020-12-27 ENCOUNTER — Ambulatory Visit: Payer: 59 | Admitting: Physical Therapy

## 2020-12-27 ENCOUNTER — Other Ambulatory Visit: Payer: Self-pay

## 2020-12-27 DIAGNOSIS — R2681 Unsteadiness on feet: Secondary | ICD-10-CM

## 2020-12-27 DIAGNOSIS — M25611 Stiffness of right shoulder, not elsewhere classified: Secondary | ICD-10-CM

## 2020-12-27 DIAGNOSIS — M6281 Muscle weakness (generalized): Secondary | ICD-10-CM

## 2020-12-27 NOTE — Therapy (Signed)
[facility] [address], [place], [place], [address] Phone: [phone]   Fax:  [phone]  Physical Therapy Treatment  Patient Details  Name: Kimberly Monroe MRN: [id number] Date of Birth: 1961/09/26 Referring Provider (PT): Malachy Chamber MD   Encounter Date: 12/27/2020   PT End of Session - 12/27/20 0809    Visit Number 3    Date for PT Re-Evaluation 02/12/21    Authorization Type UHC    PT Start Time 0801    PT Stop Time 0840    PT Time Calculation (min) 39 min    Activity Tolerance Patient tolerated treatment well;Other (comment)   became nauseous when trying to be in the gym   Behavior During Therapy [facility] for tasks assessed/performed           Past Medical History:  Diagnosis Date  . Allergy    seasonal  . Anemia   . Anxiety   . 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 Basal cell cancer   . Cognitive changes   . Depression   . Goals of care, counseling/discussion 11/02/2019  . History of blood clotting disorder   . Hx of cerebral artery stenosis   . Hypertension   . Melanoma ([place]) 2009   insitu  . Menorrhagia 02/10/2013  . Migraine    history of/none in years  . Ovarian cancer ([place]) 1995   left ovary  . Pulmonary embolism ([place])   . Squamous cell carcinoma   . Stroke [facility])     Past Surgical History:  Procedure Laterality Date  . ABDOMINAL HYSTERECTOMY  03/06/2013  . BREAST DUCTAL SYSTEM EXCISION  2009   L intraductal papilloma  . CEREBRAL ANGIOGRAM  2020  . [place]   twins, singleton  . IR ANGIO INTRA EXTRACRAN SEL COM CAROTID INNOMINATE BILAT MOD SED  11/05/2018  . IR ANGIO VERTEBRAL SEL SUBCLAVIAN INNOMINATE UNI L MOD SED  11/05/2018  . IR ANGIO VERTEBRAL SEL VERTEBRAL UNI R MOD SED  11/05/2018  . IR US GUIDE VASC ACCESS RIGHT  11/05/2018  . laparoscopy with ovarian cystectomy  1994   ovarian torsion  . 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 MOHS SURGERY  2009    Dr Link Snuffer    There were no vitals filed for this visit.   Subjective Assessment - 12/27/20 0823    Subjective I was able to try all the exercises, I was sick on Tuesday when I didn't come in.    Patient Stated Goals to be safe getting around    Currently in Pain? No/denies                             [facility] Adult PT Treatment/Exercise - 12/27/20 0001      Ambulation/Gait   Ambulation/Gait Assistance 5: Supervision    Ambulation Surface Level    Gait Comments walking through gym - cued to say Right; Left when walking to help focus and drown out background noises      Knee/Hip Exercises: Seated   Long Arc Quad Strengthening;Both;3 sets;10 reps    [facility] 30 reps    Clamshell with TheraBand --   green loop - 30x   Hamstring Curl Strengthening;Both;3 sets;10 reps    Hamstring Limitations green loop    Sit to Sand 1 set;5 reps;with UE support   in gym - stopped due to pt feeling too  much distraction - getting naseous                   PT Short Term Goals - 12/27/20 0810      PT SHORT TERM GOAL #1   Title Ind with initial HEP    Status Achieved      PT SHORT TERM GOAL #2   Title Patient compliant with use of appropriate AD at home and in the community to prevent further falls.    Baseline have to pick up the RW    Status On-going      PT SHORT TERM GOAL #3   Title Improved 5x sit to stand to 30 seconds    Status On-going             PT Long Term Goals - 12/18/20 1314      PT LONG TERM GOAL #1   Title Ind with advanced HEP to maintain gains in strength and ROM    Time 8    Period Weeks    Status New    Target Date 02/12/21      PT LONG TERM GOAL #2   Title Improved nomal TUG(on carpet if tolerated) with AD to <= 10 sec to decrease fall risk    Time 8    Period Weeks    Status New      PT LONG TERM GOAL #3   Title Improved 5x sit to stand to < 20 sec to decrease likelihood of falls    Time 8    Period Weeks    Status New       PT LONG TERM GOAL #4   Title Improved BERG score to 40/56 to decrease likelihood of falls.    Time 8    Period Weeks    Status New      PT LONG TERM GOAL #5   Title Improve right UE flexion to >= 120 deg to improve function of Rt UE    Time 8    Period Weeks    Status New                 Plan - 12/27/20 0815    Clinical Impression Statement Initially attempted to work in the gym today but patient was distracted and felt nauseous with lights and other conversations going on.  Pt was brought to room with dim lighting and focused on progressing LE strength today.  pt was able to get RW ordered but has not picked up.  Needs supervision when walking through the gym.  Pt will benefit from skilled PT to continue to address strength, balance, and gait.    PT Treatment/Interventions ADLs/Self Care Home Management;Neuromuscular re-education;Balance training;Therapeutic exercise;Therapeutic activities;Stair training;Gait training;Patient/family education;Manual techniques;Dry needling    PT Next Visit Plan TREAT IN A ROOM WITH DIM LIGHTING-- possibly can gradually introduce distractions and light for increased challenge, work on sit to stand, functional strength; gait with ADs (maybe outside?), balance    PT [place]    Recommended Other Services May need OT - pt having difficulty coordinating motor movements for cooking    Consulted and Agree with Plan of Care Patient           Patient will benefit from skilled therapeutic intervention in order to improve the following deficits and impairments:  Abnormal gait,Decreased range of motion,Impaired UE functional use,Decreased balance,Decreased strength,Postural dysfunction,Impaired flexibility,Dizziness  Visit Diagnosis: Unsteadiness on feet  Muscle weakness (generalized)  Stiffness of right shoulder,  not elsewhere classified     Problem List Patient Active Problem List   Diagnosis Date Noted  .  Hyperlipidemia 01/11/2020  . 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 Goals of care, counseling/discussion 11/02/2019  . Abnormal brain MRI 12/07/2018  . Confusion 12/07/2018  . TIA (transient ischemic attack) 11/04/2018  . CVA (cerebral vascular accident) ([place]) 03/29/2018  . Essential hypertension 06/20/2016  . Suicidal ideation 09/20/2013  . MDD (major depressive disorder) 09/20/2013  . Menorrhagia 02/10/2013  . Pulmonary embolism ([place]) 09/09/2012  . History of pulmonary embolism 09/06/2012  . Acute respiratory failure with hypoxia ([place]) 02/04/2012  . Bilateral pulmonary embolism ([place]) 02/04/2012  . Hypokalemia 02/04/2012  . Hyponatremia 02/04/2012  . Elevation of cardiac enzymes 02/04/2012  . Family History of hypercoagulable state 02/04/2012  . History of squamous cell carcinoma of skin 12/17/2011  . History  of basal cell carcinoma 12/17/2011  . Depression 12/17/2011  . Iron deficiency anemia - severe 12/17/2011  . Melanoma ([place])     Jule Ser, PT 12/27/2020, 8:44 AM  [facility] [address] [place], [place], [address] Phone: [phone]   Fax:  [phone]  Name: Kimberly Monroe MRN: [id number] Date of Birth: 1961/06/18

## 2020-12-30 ENCOUNTER — Other Ambulatory Visit: Payer: Self-pay | Admitting: Family Medicine

## 2021-01-01 ENCOUNTER — Encounter: Payer: Self-pay | Admitting: Physical Therapy

## 2021-01-01 ENCOUNTER — Ambulatory Visit: Payer: 59 | Admitting: Physical Therapy

## 2021-01-01 ENCOUNTER — Other Ambulatory Visit: Payer: Self-pay

## 2021-01-01 DIAGNOSIS — R2681 Unsteadiness on feet: Secondary | ICD-10-CM | POA: Diagnosis not present

## 2021-01-01 DIAGNOSIS — M6281 Muscle weakness (generalized): Secondary | ICD-10-CM

## 2021-01-01 DIAGNOSIS — M25611 Stiffness of right shoulder, not elsewhere classified: Secondary | ICD-10-CM

## 2021-01-01 NOTE — Therapy (Signed)
[facility] [address] [place], [place], [address] Phone: [phone]   Fax:  [phone]  Physical Therapy Treatment  Patient Details  Name: Kimberly Monroe MRN: [id number] Date of Birth: 20-May-1961 Referring Provider (PT): Malachy Chamber MD   Encounter Date: 01/01/2021   PT End of Session - 01/01/21 0839    Visit Number 4    Date for PT Re-Evaluation 02/12/21    Authorization Type UHC    PT Start Time 0801    PT Stop Time 0840    PT Time Calculation (min) 39 min           Past Medical History:  Diagnosis Date  . Allergy    seasonal  . Anemia   . Anxiety   . 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 Basal cell cancer   . Cognitive changes   . Depression   . Goals of care, counseling/discussion 11/02/2019  . History of blood clotting disorder   . Hx of cerebral artery stenosis   . Hypertension   . Melanoma ([place]) 2009   insitu  . Menorrhagia 02/10/2013  . Migraine    history of/none in years  . Ovarian cancer ([place]) 1995   left ovary  . Pulmonary embolism ([place])   . Squamous cell carcinoma   . Stroke [facility])     Past Surgical History:  Procedure Laterality Date  . ABDOMINAL HYSTERECTOMY  03/06/2013  . BREAST DUCTAL SYSTEM EXCISION  2009   L intraductal papilloma  . CEREBRAL ANGIOGRAM  2020  . [place]   twins, singleton  . IR ANGIO INTRA EXTRACRAN SEL COM CAROTID INNOMINATE BILAT MOD SED  11/05/2018  . IR ANGIO VERTEBRAL SEL SUBCLAVIAN INNOMINATE UNI L MOD SED  11/05/2018  . IR ANGIO VERTEBRAL SEL VERTEBRAL UNI R MOD SED  11/05/2018  . IR US GUIDE VASC ACCESS RIGHT  11/05/2018  . laparoscopy with ovarian cystectomy  1994   ovarian torsion  . 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 MOHS SURGERY  2009   Dr Link Snuffer    There were no vitals filed for this visit.   Subjective Assessment - 01/01/21 0805    Subjective Patient reports that she will not be able to come Thursday. I  have to go for all of my scans in [place].    Pertinent History brain cancer, cva, headaches, stenosis, dizziness    Limitations Lifting;Walking    Patient Stated Goals to be safe getting around    Currently in Pain? No/denies    Multiple Pain Sites No                             [facility] Adult PT Treatment/Exercise - 01/01/21 0001      Ambulation/Gait   Ambulation/Gait Assistance 5: Supervision    Ambulation Surface Level    Gait Comments walking through gym - cued to say Right; Left when walking to help focus and drown out background noises      Knee/Hip Exercises: Seated   Long Arc Quad Strengthening;Both;3 sets;10 reps    Ball Squeeze 3x10 repetitions    Clamshell with TheraBand --   green loop; 3x10 repetitions   Hamstring Curl Strengthening;Both;3 sets;10 reps    Hamstring Limitations green loop                  PT Education - 01/01/21 5366  Education Details [id number]    Person(s) Educated Patient    Methods Handout    Comprehension Verbalized understanding            PT Short Term Goals - 12/27/20 0810      PT SHORT TERM GOAL #1   Title Ind with initial HEP    Status Achieved      PT SHORT TERM GOAL #2   Title Patient compliant with use of appropriate AD at home and in the community to prevent further falls.    Baseline have to pick up the RW    Status On-going      PT SHORT TERM GOAL #3   Title Improved 5x sit to stand to 30 seconds    Status On-going             PT Long Term Goals - 12/18/20 1314      PT LONG TERM GOAL #1   Title Ind with advanced HEP to maintain gains in strength and ROM    Time 8    Period Weeks    Status New    Target Date 02/12/21      PT LONG TERM GOAL #2   Title Improved nomal TUG(on carpet if tolerated) with AD to <= 10 sec to decrease fall risk    Time 8    Period Weeks    Status New      PT LONG TERM GOAL #3   Title Improved 5x sit to stand to < 20 sec to decrease likelihood of falls     Time 8    Period Weeks    Status New      PT LONG TERM GOAL #4   Title Improved BERG score to 40/56 to decrease likelihood of falls.    Time 8    Period Weeks    Status New      PT LONG TERM GOAL #5   Title Improve right UE flexion to >= 120 deg to improve function of Rt UE    Time 8    Period Weeks    Status New                 Plan - 01/01/21 0844    Clinical Impression Statement Patient unable to tolerate any activity in gym this date due to increased sensory input leading to nausea and dizziness. Able to complete 3/4 of session in treatment room with full lighting before needing lighting to be dimmed. Patient requiring frequent recovery periods throughout LE strengthening. Unable to progress to increased repetitions or weight. Would benefit from continued skilled intervention to address impairments for improved functional mobility and decreased fall risk.    Personal Factors and Comorbidities Comorbidity 3+    Comorbidities brain cancer, cva, headaches, stenosis, dizziness    Examination-Activity Limitations Locomotion Level;Stairs    Rehab Potential Good    PT Frequency 2x / week    PT Duration 8 weeks    PT Treatment/Interventions ADLs/Self Care Home Management;Neuromuscular re-education;Balance training;Therapeutic exercise;Therapeutic activities;Stair training;Gait training;Patient/family education;Manual techniques;Dry needling    PT Next Visit Plan TREAT IN A ROOM WITH DIM LIGHTING-- possibly can gradually introduce distractions and light for increased challenge; progress functional strength and balance to patient tolerance    PT Home Exercise Plan [id number]    Consulted and Agree with Plan of Care Patient           Patient will benefit from skilled therapeutic intervention in order to improve the following deficits and  impairments:  Abnormal gait,Decreased range of motion,Impaired UE functional use,Decreased balance,Decreased strength,Postural  dysfunction,Impaired flexibility,Dizziness  Visit Diagnosis: Unsteadiness on feet  Muscle weakness (generalized)  Stiffness of right shoulder, not elsewhere classified     Problem List Patient Active Problem List   Diagnosis Date Noted  . Hyperlipidemia 01/11/2020  . 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 Goals of care, counseling/discussion 11/02/2019  . Abnormal brain MRI 12/07/2018  . Confusion 12/07/2018  . TIA (transient ischemic attack) 11/04/2018  . CVA (cerebral vascular accident) ([place]) 03/29/2018  . Essential hypertension 06/20/2016  . Suicidal ideation 09/20/2013  . MDD (major depressive disorder) 09/20/2013  . Menorrhagia 02/10/2013  . Pulmonary embolism ([place]) 09/09/2012  . History of pulmonary embolism 09/06/2012  . Acute respiratory failure with hypoxia ([place]) 02/04/2012  . Bilateral pulmonary embolism ([place]) 02/04/2012  . Hypokalemia 02/04/2012  . Hyponatremia 02/04/2012  . Elevation of cardiac enzymes 02/04/2012  . Family History of hypercoagulable state 02/04/2012  . History of squamous cell carcinoma of skin 12/17/2011  . History  of basal cell carcinoma 12/17/2011  . Depression 12/17/2011  . Iron deficiency anemia - severe 12/17/2011  . Melanoma [facility])    Everardo All PT, DPT  01/01/21 8:52 AM  [facility] [address] [place], [place], [address] Phone: [phone]   Fax:  [phone]  Name: Kimberly Monroe MRN: [id number] Date of Birth: 1961-07-09

## 2021-01-03 ENCOUNTER — Encounter: Payer: 59 | Admitting: Physical Therapy

## 2021-01-07 ENCOUNTER — Encounter: Payer: Self-pay | Admitting: Physical Therapy

## 2021-01-08 ENCOUNTER — Ambulatory Visit: Payer: 59 | Attending: Specialist | Admitting: Physical Therapy

## 2021-01-08 ENCOUNTER — Telehealth: Payer: Self-pay | Admitting: Physical Therapy

## 2021-01-08 DIAGNOSIS — M25611 Stiffness of right shoulder, not elsewhere classified: Secondary | ICD-10-CM | POA: Insufficient documentation

## 2021-01-08 DIAGNOSIS — R4701 Aphasia: Secondary | ICD-10-CM | POA: Insufficient documentation

## 2021-01-08 DIAGNOSIS — R2681 Unsteadiness on feet: Secondary | ICD-10-CM | POA: Insufficient documentation

## 2021-01-08 DIAGNOSIS — R41841 Cognitive communication deficit: Secondary | ICD-10-CM | POA: Insufficient documentation

## 2021-01-08 DIAGNOSIS — M6281 Muscle weakness (generalized): Secondary | ICD-10-CM | POA: Insufficient documentation

## 2021-01-08 NOTE — Telephone Encounter (Signed)
Spoke with patient regarding missed visit today (4/5) at 8 am. Patient stated that she has had a fever and did not believe that she was allowed to come due to protocols. States that she left a VM and sent a MyChart message.

## 2021-01-10 ENCOUNTER — Ambulatory Visit: Payer: 59 | Admitting: Physical Therapy

## 2021-01-15 ENCOUNTER — Ambulatory Visit: Payer: 59 | Admitting: Physical Therapy

## 2021-01-15 ENCOUNTER — Encounter: Payer: 59 | Admitting: Physical Therapy

## 2021-01-15 ENCOUNTER — Encounter: Payer: Self-pay | Admitting: Physical Therapy

## 2021-01-17 ENCOUNTER — Other Ambulatory Visit: Payer: Self-pay

## 2021-01-17 ENCOUNTER — Ambulatory Visit: Payer: 59 | Admitting: Physical Therapy

## 2021-01-17 ENCOUNTER — Encounter: Payer: Self-pay | Admitting: Physical Therapy

## 2021-01-17 DIAGNOSIS — M25611 Stiffness of right shoulder, not elsewhere classified: Secondary | ICD-10-CM | POA: Diagnosis present

## 2021-01-17 DIAGNOSIS — M6281 Muscle weakness (generalized): Secondary | ICD-10-CM

## 2021-01-17 DIAGNOSIS — R4701 Aphasia: Secondary | ICD-10-CM | POA: Diagnosis present

## 2021-01-17 DIAGNOSIS — R41841 Cognitive communication deficit: Secondary | ICD-10-CM | POA: Diagnosis present

## 2021-01-17 DIAGNOSIS — R2681 Unsteadiness on feet: Secondary | ICD-10-CM | POA: Diagnosis not present

## 2021-01-17 NOTE — Therapy (Addendum)
[facility] [address], [place], [place], [address] Phone: [phone]   Fax:  [phone]  Physical Therapy Treatment  Patient Details  Name: Kimberly Monroe MRN: [id number] Date of Birth: 1960/10/29 Referring Provider (PT): Malachy Chamber MD   Encounter Date: 01/17/2021   PT End of Session - 01/17/21 0926     Visit Number 5    Date for PT Re-Evaluation 02/12/21    Authorization Type UHC    PT Start Time 0805   increased time at check in   PT Stop Time 0845    PT Time Calculation (min) 40 min    Activity Tolerance Patient tolerated treatment well;Other (comment)    Behavior During Therapy WFL for tasks assessed/performed             Past Medical History:  Diagnosis Date   Allergy    seasonal   Anemia    Anxiety    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   Basal cell cancer    Cognitive changes    Depression    Goals of care, counseling/discussion 11/02/2019   History of blood clotting disorder    Hx of cerebral artery stenosis    Hypertension    Melanoma ([place]) 2009   insitu   Menorrhagia 02/10/2013   Migraine    history of/none in years   Ovarian cancer ([place]) 1995   left ovary   Pulmonary embolism ([place])    Squamous cell carcinoma    Stroke [facility])     Past Surgical History:  Procedure Laterality Date   ABDOMINAL HYSTERECTOMY  03/06/2013   BREAST DUCTAL SYSTEM EXCISION  2009   L intraductal papilloma   CEREBRAL ANGIOGRAM  2020   CESAREAN SECTION  1996, 2000   twins, singleton   IR ANGIO INTRA EXTRACRAN SEL COM CAROTID INNOMINATE BILAT MOD SED  11/05/2018   IR ANGIO VERTEBRAL SEL SUBCLAVIAN INNOMINATE UNI L MOD SED  11/05/2018   IR ANGIO VERTEBRAL SEL VERTEBRAL UNI R MOD SED  11/05/2018   IR US GUIDE VASC ACCESS RIGHT  11/05/2018   laparoscopy with ovarian cystectomy  1994   ovarian torsion   LAPAROTOMY  1995   ovarian thecoma   MOHS SURGERY  2009   Dr Link Snuffer    There were no vitals  filed for this visit.   Subjective Assessment - 01/17/21 0919     Subjective Patient feels frustration as she was supposed to receive PT/OT/Speech services at neuro clinic but declined due to travel time. Feels frustrated because she was not made aware of transportation services and could have been receiving transportation to neuro clinic the entire time. Would like to do back stretches and mobility this session.    Pertinent History brain cancer, cva, headaches, stenosis, dizziness    Limitations Lifting;Walking    Patient Stated Goals to be safe getting around    Currently in Pain? Yes    Pain Score --   unable to quantify   Pain Location Back    Pain Orientation Lower    Pain Descriptors / Indicators Discomfort                               [facility] Adult PT Treatment/Exercise - 01/17/21 0001       Ambulation/Gait   Ambulation/Gait Assistance 5: Supervision    Ambulation Surface Level    Gait Comments walking through gym - cued to pause  at each floor surface change to regain balance and depth perception; loss of balance x1 requiring CGA to maintain standing      Lumbar Exercises: Stretches   Single Knee to Chest Stretch Right;Left;1 rep;20 seconds    Single Knee to Chest Stretch Limitations using towel    Lower Trunk Rotation 3 reps;10 seconds    Lower Trunk Rotation Limitations bil    Piriformis Stretch Right;Left;2 reps;20 seconds    Piriformis Stretch Limitations active assisted      Knee/Hip Exercises: Seated   Clamshell with TheraBand Yellow   2x10 repetitions     Knee/Hip Exercises: Supine   Bridges Both;2 sets;5 reps    Bridges Limitations 25% ROM      Manual Therapy   Manual Therapy Manual Traction    Manual Traction manual lumbar traction w/ bil LE on ball and therapist providing traction force                      PT Short Term Goals - 12/27/20 0810       PT SHORT TERM GOAL #1   Title Ind with initial HEP    Status Achieved       PT SHORT TERM GOAL #2   Title Patient compliant with use of appropriate AD at home and in the community to prevent further falls.    Baseline have to pick up the RW    Status On-going      PT SHORT TERM GOAL #3   Title Improved 5x sit to stand to 30 seconds    Status On-going               PT Long Term Goals - 12/18/20 1314       PT LONG TERM GOAL #1   Title Ind with advanced HEP to maintain gains in strength and ROM    Time 8    Period Weeks    Status New    Target Date 02/12/21      PT LONG TERM GOAL #2   Title Improved nomal TUG(on carpet if tolerated) with AD to <= 10 sec to decrease fall risk    Time 8    Period Weeks    Status New      PT LONG TERM GOAL #3   Title Improved 5x sit to stand to < 20 sec to decrease likelihood of falls    Time 8    Period Weeks    Status New      PT LONG TERM GOAL #4   Title Improved BERG score to 40/56 to decrease likelihood of falls.    Time 8    Period Weeks    Status New      PT LONG TERM GOAL #5   Title Improve right UE flexion to >= 120 deg to improve function of Rt UE    Time 8    Period Weeks    Status New                   Plan - 01/17/21 8563     Clinical Impression Statement Patient able to complete part of session in gym this date due to decreased patient and therapist presence. Requiring frequent recovery periods between activities and intermittent redirection to task. Requiring assist to perform supine piriformis stretch. Requiring increased time to transition sit to supine and supine to sit. Reporting decreased back discomfort at end of session. Would benefit from continued intervention to address impairments  for improved functional mobility and decreased fall risk.    Personal Factors and Comorbidities Comorbidity 3+    Comorbidities brain cancer, cva, headaches, stenosis, dizziness    Examination-Activity Limitations Locomotion Level;Stairs    Rehab Potential Good    PT Frequency 2x / week     PT Duration 8 weeks    PT Treatment/Interventions ADLs/Self Care Home Management;Neuromuscular re-education;Balance training;Therapeutic exercise;Therapeutic activities;Stair training;Gait training;Patient/family education;Manual techniques;Dry needling    PT Next Visit Plan continue treating in room with dim lighting unless gym is relatively empty; continue lumbar mobility; progress functional strength and balance as able    PT Home Exercise Plan [email or web address]    Consulted and Agree with Plan of Care Patient             Patient will benefit from skilled therapeutic intervention in order to improve the following deficits and impairments:  Abnormal gait,Decreased range of motion,Impaired UE functional use,Decreased balance,Decreased strength,Postural dysfunction,Impaired flexibility,Dizziness  Visit Diagnosis: Unsteadiness on feet  Muscle weakness (generalized)  Stiffness of right shoulder, not elsewhere classified     Problem List Patient Active Problem List   Diagnosis Date Noted   Hyperlipidemia 01/11/2020   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   Goals of care, counseling/discussion 11/02/2019   Abnormal brain MRI 12/07/2018   Confusion 12/07/2018   TIA (transient ischemic attack) 11/04/2018   CVA (cerebral vascular accident) ([place]) 03/29/2018   Essential hypertension 06/20/2016   Suicidal ideation 09/20/2013   MDD (major depressive disorder) 09/20/2013   Menorrhagia 02/10/2013   Pulmonary embolism ([place]) 09/09/2012   History of pulmonary embolism 09/06/2012   Acute respiratory failure with hypoxia ([place]) 02/04/2012   Bilateral pulmonary embolism ([place]) 02/04/2012   Hypokalemia 02/04/2012   Hyponatremia 02/04/2012   Elevation of cardiac enzymes 02/04/2012   Family History of hypercoagulable state 02/04/2012   History of squamous cell carcinoma of skin 12/17/2011   History  of basal cell carcinoma 12/17/2011   Depression 12/17/2011   Iron deficiency anemia - severe  12/17/2011   Melanoma ([place])     PHYSICAL THERAPY DISCHARGE SUMMARY  Visits from Start of Care: 5  Current functional level related to goals / functional outcomes: See above   Remaining deficits: See above   Education / Equipment: See above    Patient agrees to discharge. Patient goals were not met. Patient is being discharged due to the patient's request.   Everardo All PT, DPT  01/17/21 9:31 AM    [facility] [address], [place], [place], [address] Phone: [phone]   Fax:  [phone]  Name: Kimberly Monroe MRN: [id number] Date of Birth: 08/13/1961

## 2021-01-22 ENCOUNTER — Encounter: Payer: 59 | Admitting: Physical Therapy

## 2021-01-24 ENCOUNTER — Other Ambulatory Visit: Payer: Self-pay

## 2021-01-24 ENCOUNTER — Encounter: Payer: 59 | Admitting: Physical Therapy

## 2021-01-24 ENCOUNTER — Ambulatory Visit: Payer: 59 | Admitting: Physical Therapy

## 2021-01-24 ENCOUNTER — Ambulatory Visit: Payer: 59

## 2021-01-24 ENCOUNTER — Encounter: Payer: Self-pay | Admitting: Physical Therapy

## 2021-01-24 DIAGNOSIS — R41841 Cognitive communication deficit: Secondary | ICD-10-CM

## 2021-01-24 DIAGNOSIS — R4701 Aphasia: Secondary | ICD-10-CM

## 2021-01-24 DIAGNOSIS — R2681 Unsteadiness on feet: Secondary | ICD-10-CM | POA: Diagnosis not present

## 2021-01-24 DIAGNOSIS — M6281 Muscle weakness (generalized): Secondary | ICD-10-CM

## 2021-01-24 NOTE — Therapy (Addendum)
[facility] [address] [place], [place], [address] Phone: [phone]   Fax:  [phone]  Physical Therapy Treatment  Patient Details  Name: Kimberly Monroe MRN: [id number] Date of Birth: [birth year] Referring Provider (PT): Malachy Chamber MD   Encounter Date: 01/24/2021   PT End of Session - 01/25/21 1205    Visit Number 6    Number of Visits 17    Date for PT Re-Evaluation 02/12/21    Authorization Type UHC    PT Start Time 1100    PT Stop Time 1146    PT Time Calculation (min) 46 min    Activity Tolerance Patient tolerated treatment well;Other (comment)    Behavior During Therapy WFL for tasks assessed/performed           Past Medical History:  Diagnosis Date  . Allergy    seasonal  . Anemia   . Anxiety   . 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 Basal cell cancer   . Cognitive changes   . Depression   . Goals of care, counseling/discussion 11/02/2019  . History of blood clotting disorder   . Hx of cerebral artery stenosis   . Hypertension   . Melanoma ([place]) 2009   insitu  . Menorrhagia 02/10/2013  . Migraine    history of/none in years  . Ovarian cancer ([place]) 1995   left ovary  . Pulmonary embolism ([place])   . Squamous cell carcinoma   . Stroke [facility])     Past Surgical History:  Procedure Laterality Date  . ABDOMINAL HYSTERECTOMY  03/06/2013  . BREAST DUCTAL SYSTEM EXCISION  2009   L intraductal papilloma  . CEREBRAL ANGIOGRAM  2020  . [name]   twins, singleton  . IR ANGIO INTRA EXTRACRAN SEL COM CAROTID INNOMINATE BILAT MOD SED  11/05/2018  . IR ANGIO VERTEBRAL SEL SUBCLAVIAN INNOMINATE UNI L MOD SED  11/05/2018  . IR ANGIO VERTEBRAL SEL VERTEBRAL UNI R MOD SED  11/05/2018  . IR US GUIDE VASC ACCESS RIGHT  11/05/2018  . laparoscopy with ovarian cystectomy  1994   ovarian torsion  . 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 MOHS SURGERY  2009   Dr Link Snuffer    There  were no vitals filed for this visit.   Subjective Assessment - 01/24/21 1103    Subjective Has not had any falls since using the cane. Has a script for the RW, but has not picked it up since she feels like she trips over it. Gets headaches (sometimes its dizziness), has trouble looking at busy backgrounds. 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                             01/24/21 1123  Transfers  Transfers Sit to Stand;Stand to Sit  Sit to Stand 5: Supervision;4: Min guard;With upper extremity assist  Sit to Stand Details (indicate cue type and reason) min guard initially in standing for balance, decr eccentric control  Five time sit to stand comments  62 seconds  Stand to Sit  5: Supervision;With upper extremity assist  Ambulation/Gait  Ambulation/Gait Yes  Ambulation/Gait Assistance 4: Min guard;5: Supervision  Ambulation/Gait Assistance Details pt using cane in RUE - pt reports that she is aware that she should use it in LUE (due to RLE weakness), but feels more comfortable using it in RUE  Ambulation Distance (Feet) 80 Feet  Assistive device Straight cane  Gait Pattern Wide base of support;Decreased hip/knee flexion - right;Decreased hip/knee flexion - left;Step-through pattern  Ambulation Surface Level;Indoor  Gait velocity 40.22 seconds = .82 ft/sec  Gait Comments pt is bothered by the glare from the tiles on the floor       Access Code: [email or web address] URL: [email or web address] Date: 01/24/2021 Prepared by: Janann August  Reviewed pt's HEP (received from PT at [facility]). Modified and updated as appropriate. Pt needing cues for  proper technique, printed out an additional copy for pt to perform at home with her daughter (due to cognitive deficits). Also discussed with pt having her daughter come in to future sessions for improved carryover at home.   Exercises Seated Cervical Rotation AROM - 2 x daily - 7 x weekly - 1 sets - 10 reps - 5 sec hold Sit to Stand with Counter Support - 2 x daily - 7 x weekly - 2 sets - 5 reps Standing Balance in Corner - 2 x daily - 7 x weekly - 1 sets - 5 reps - max hold Seated Scapular Retraction - 1 x daily - 7 x weekly - 2 sets - 10 reps Side to Side Weight Shift with Counter Support - 2 x daily - 7 x weekly - 10 reps      PT Education - 01/25/21 1204    Education Details areas that neuro rehab will focus on, reviewed and revised pt's HEP as appropriate (for her to perform at home with daughter)    Person(s) Educated Patient    Methods Explanation;Demonstration;Handout    Comprehension Verbalized understanding;Returned demonstration            PT Short Term Goals - 01/24/21 1109      PT SHORT TERM GOAL #1   Title Ind with initial HEP    Baseline pt has not been performing HEP consistently due to cognitive deficits - daughter not present with pt to review    Status Not Met    Target Date 01/15/21      PT SHORT TERM GOAL #2   Title Patient compliant with use of appropriate AD at home and in the community to prevent further falls.    Baseline uses SPC on 01/24/21    Status Achieved      PT SHORT TERM GOAL #3   Title Improved 5x sit to stand to 30 seconds    Baseline 62 seconds on 01/24/21    Status Not Met             PT Long Term Goals - 12/18/20 1314      PT LONG TERM GOAL #1   Title Ind with advanced HEP to maintain gains in strength and ROM    Time 8    Period Weeks    Status New    Target Date 02/12/21      PT LONG TERM GOAL #2   Title Improved nomal TUG(on carpet if tolerated) with AD to <= 10 sec to decrease fall risk    Time 8    Period Weeks     Status New      PT LONG  TERM GOAL #3   Title Improved 5x sit to stand to < 20 sec to decrease likelihood of falls    Time 8    Period Weeks    Status New      PT LONG TERM GOAL #4   Title Improved BERG score to 40/56 to decrease likelihood of falls.    Time 8    Period Weeks    Status New      PT LONG TERM GOAL #5   Title Improve right UE flexion to >= 120 deg to improve function of Rt UE    Time 8    Period Weeks    Status New                01/30/21 0844  Plan  Clinical Impression Statement Pt presents for 1st visit at [place] at neuro rehab (transfer from [place]). Addressed STGs today. Pt did not meet STGs #1 and #3. Pt has not been peforming HEP at home consistently (most likely due to cognitive deficits), when she does perform it is with her daughther. Pt did not meet 5x sit <> stand goal, performed in 62 seconds today with BUE support - first couple reps needing min guard in standing. Pt ambulating into clinic today with SPC, performed in .46 ft/sec, indicating pt is a household ambulator and at an incr risk for falls. Treatment performed in a quiet treatment room - pt is bothered from busy environments and the brightness/glare from the tile floor. Pt also reports she gets dizzy with head movements - would benefit from further vestibular assessment.  Personal Factors and Comorbidities Comorbidity 3+  Comorbidities brain cancer, cva, headaches, stenosis, dizziness  Examination-Activity Limitations Locomotion Level;Stairs  Pt will benefit from skilled therapeutic intervention in order to improve on the following deficits Abnormal gait;Decreased range of motion;Impaired UE functional use;Decreased balance;Decreased strength;Postural dysfunction;Impaired flexibility;Dizziness  Rehab Potential Good  PT Frequency 2x / week  PT Duration 8 weeks  PT Treatment/Interventions ADLs/Self Care Home Management;Neuromuscular re-education;Balance training;Therapeutic exercise;Therapeutic  activities;Stair training;Gait training;Patient/family education;Manual techniques;Dry needling  PT Next Visit Plan treating in more quiet room if possible, gait training with SPC vs. RW (pt has an order for one), standing balance at countertop/static balance with decr UE support, cervical ROM (ext and rotation), sit <> stand transfers.  PT Home Exercise Plan [email or web address]  Consulted and Agree with Plan of Care Patient       Patient will benefit from skilled therapeutic intervention in order to improve the following deficits and impairments:     Visit Diagnosis: Unsteadiness on feet  Muscle weakness (generalized)     Problem List Patient Active Problem List   Diagnosis Date Noted  . Hyperlipidemia 01/11/2020  . 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 Goals of care, counseling/discussion 11/02/2019  . Abnormal brain MRI 12/07/2018  . Confusion 12/07/2018  . TIA (transient ischemic attack) 11/04/2018  . CVA (cerebral vascular accident) ([place]) 03/29/2018  . Essential hypertension 06/20/2016  . Suicidal ideation 09/20/2013  . MDD (major depressive disorder) 09/20/2013  . Menorrhagia 02/10/2013  . Pulmonary embolism ([place]) 09/09/2012  . History of pulmonary embolism 09/06/2012  . Acute respiratory failure with hypoxia ([place]) 02/04/2012  . Bilateral pulmonary embolism ([place]) 02/04/2012  . Hypokalemia 02/04/2012  . Hyponatremia 02/04/2012  . Elevation of cardiac enzymes 02/04/2012  . Family History of hypercoagulable state 02/04/2012  . History of squamous cell carcinoma of skin 12/17/2011  . History  of basal cell carcinoma 12/17/2011  . Depression 12/17/2011  .  Iron deficiency anemia - severe 12/17/2011  . Melanoma ([place])     Arliss Journey, PT, DPT  01/25/2021, 12:07 PM  [facility] [address] [place], [place], [address] Phone: [phone]   Fax:  [phone]  Name: JAELIN DEVINCENTIS MRN:  [id number] Date of Birth: 04/03/1961

## 2021-01-24 NOTE — Patient Instructions (Signed)
Access Code: [email or web address] URL: [email or web address] Date: 01/24/2021 Prepared by: Janann August  Exercises Seated Cervical Rotation AROM - 2 x daily - 7 x weekly - 1 sets - 10 reps - 5 sec hold Sit to Stand with Counter Support - 2 x daily - 7 x weekly - 2 sets - 5 reps Standing Balance in Corner - 2 x daily - 7 x weekly - 1 sets - 5 reps - max hold Seated Scapular Retraction - 1 x daily - 7 x weekly - 2 sets - 10 reps Side to Side Weight Shift with Counter Support - 2 x daily - 7 x weekly - 10 reps

## 2021-01-24 NOTE — Therapy (Addendum)
[facility] [address] [place], [place], [address] Phone: [phone]   Fax:  [phone]  Speech Language Pathology Evaluation  Patient Details  Name: Kimberly Monroe MRN: [id number] Date of Birth: 08/24/1961 Referring Provider (SLP): Malachy Chamber, MD   Encounter Date: 01/24/2021   End of Session - 01/24/21 1203    Visit Number 1    Number of Visits 17    Date for SLP Re-Evaluation 04/24/21    Authorization Type [employer]    Authorization Time Period 48 ST- 30 remaining    SLP Start Time 1015    SLP Stop Time  1100    SLP Time Calculation (min) 45 min    Activity Tolerance Patient tolerated treatment well;Other (comment)   emotional lability          Past Medical History:  Diagnosis Date  . Allergy    seasonal  . Anemia   . Anxiety   . 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 Basal cell cancer   . Cognitive changes   . Depression   . Goals of care, counseling/discussion 11/02/2019  . History of blood clotting disorder   . Hx of cerebral artery stenosis   . Hypertension   . Melanoma ([place]) 2009   insitu  . Menorrhagia 02/10/2013  . Migraine    history of/none in years  . Ovarian cancer ([place]) 1995   left ovary  . Pulmonary embolism ([place])   . Squamous cell carcinoma   . Stroke [facility])     Past Surgical History:  Procedure Laterality Date  . ABDOMINAL HYSTERECTOMY  03/06/2013  . BREAST DUCTAL SYSTEM EXCISION  2009   L intraductal papilloma  . CEREBRAL ANGIOGRAM  2020  . [place]   twins, singleton  . IR ANGIO INTRA EXTRACRAN SEL COM CAROTID INNOMINATE BILAT MOD SED  11/05/2018  . IR ANGIO VERTEBRAL SEL SUBCLAVIAN INNOMINATE UNI L MOD SED  11/05/2018  . IR ANGIO VERTEBRAL SEL VERTEBRAL UNI R MOD SED  11/05/2018  . IR US GUIDE VASC ACCESS RIGHT  11/05/2018  . laparoscopy with ovarian cystectomy  1994   ovarian torsion  . 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 MOHS SURGERY  2009    Dr Link Snuffer    There were no vitals filed for this visit.       SLP Evaluation [facility] - 01/24/21 1005      SLP Visit Information   SLP Received On 11/21/20    Referring Provider (SLP) Malachy Chamber, MD    Onset Date December 2020    Medical Diagnosis Malignant neoplasm of brain, unspecified      Subjective   Patient/Family Stated Goal "I want to be functional and not be a burden"      Pain Assessment   Currently in Pain? 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 ?tumor progression and short-interval MRI today showing stable disease but also subacute thalamic infarct, secondary seizures, APS, TIA, PE in 2013. The patient's presentation is consistent with cognitive dysfunction due to her cancer and/or cancer treatment. Had full neuropsychological assessment by Dr. Sima Matas, PsyD at [facility] on 01/11/2019 at which time deficits c/w her frontal tumor were identified, including in expressive language, working  memory, and executive function. Also with slow processing speed which was attributed more to small vessel disease. Was evaluated by Speech Therapy on 12/2019. RBANS significant for extremely low or borderline performance in delayed memory, immediate memory, and attention. Repeat Neuropsych testing by Dr. Melanee Spry revealing most pronounced deficits in processing speed and executive function.      Balance Screen   Has the patient fallen in the past 6 months Yes    How many times? --   several times   Has the patient had a decrease in activity level because of a fear of falling?  Yes    Is the patient reluctant to leave their home because of a fear of falling?  Yes      Prior Functional Status   Cognitive/Linguistic Baseline Within functional limits    Type of  Home House     Lives With Spouse;Daughter    Available Support Family;Friend(s)    Vocation On disability   director     Cognition   Overall Cognitive Status Impaired/Different from baseline    Area of Impairment Attention;Memory;Following commands;Problem solving    Current Attention Level Selective    Attention Comments diffiiculty with alternating and divided attention    Memory Decreased short-term memory    Following Commands Follows multi-step commands with increased time    Following Command Comments difficulty with comprehending multi-step directions    Problem Solving Slow processing;Decreased initiation;Difficulty sequencing    Problem Solving Comments difficulty completing general household tasks      Auditory Comprehension   Overall Auditory Comprehension Impaired    Conversation Moderately complex    Interfering Components Attention;Anxiety;Visual impairments;Processing speed;Working Armed forces logistics/support/administrative officer Expression   Overall Verbal Expression Impaired    Naming Impairment    Responsive 51-75% accurate    Confrontation Not tested    Convergent Not tested    Other Naming Comments dysnomia/anomia in conversation      Oral Motor/Sensory Function   Overall Oral Motor/Sensory Function Appears within functional limits for tasks assessed      Motor Speech   Overall Motor Speech Appears within functional limits for tasks assessed      Standardized Assessments   Standardized Assessments  Cognitive Linguistic Quick Test   initiated this session, unable to complete due to fatigue and tearfulness                          SLP Education - 01/24/21 1203    Education Details eval results, possible goals    Person(s) Educated Patient    Methods Explanation;Demonstration    Comprehension Verbalized understanding;Returned demonstration;Need further instruction;Verbal cues required            SLP  Short Term Goals - 01/24/21 1204      SLP SHORT TERM GOAL #1   Title Pt will use 2 memory and attention compensations to aid successful completion of household tasks and other daily activities with occasional min A over 3 sessions.    Time 4    Period Weeks    Status New      SLP SHORT TERM GOAL #2   Title Pt will complete 2-3 step sequences related to household tasks for 4/5 opportunities with occassional min A over 3 sessions    Time 4    Period Weeks    Status New      SLP SHORT TERM GOAL #3  Title Pt will use word finding compensations in simple 10 minute conversations with rare min A over 2 sessions    Time 4    Period Weeks    Status New      SLP SHORT TERM GOAL #4   Title Pt will follow simple 4-5 step recipes for cooking at home with use of compensations given min A from family for 2/2 opportunities    Time 4    Period Weeks    Status New      SLP SHORT TERM GOAL #5   Title Pt will comprehend simple to mod complex medical information related to her care with use of clarifying questions and confirming statements over 2 sessions    Time 4    Period Weeks    Status New            SLP Long Term Goals - 01/24/21 1209      SLP LONG TERM GOAL #1   Title Pt will use 4 memory and attention compensations for successful completion of household tasks and other daily activities with rare min A from family over 2 sessions    Time 8    Period Weeks    Status New      SLP LONG TERM GOAL #2   Title Pt will successfully complete 2 household tasks of choice with use of compensations and occasional min A over 2 sessions    Time 8    Period Weeks    Status New      SLP LONG TERM GOAL #3   Title Pt will use word finding compensations in 15 min simple to mod complex conversations with rare min A over 2 sessions    Time 8    Period Weeks    Status New      SLP LONG TERM GOAL #4   Title Pt will comprehend mod complex to complex medical information related to her care with use  of clarifying questions and confirming statements over 2 sessions    Time 8    Period Weeks    Status New      SLP LONG TERM GOAL #5   Title Pt will report reduced frustration related to cognitive communication skills by last ST session    Time 8    Period Weeks    Status New            Plan - 01/24/21 1214    Clinical Impression Statement Kortnie was referred for OPST evaluation to assess changes in cognitive linguistic skills s/p malignant neoplasm of brain. PMHX significant for L frontal diffuse astrocytoma s/p resection 09/2019, radiation 01/2020, adjuvant TMZ completed 07/2020, as well as subacute thalamic infarct, secondary seizures, APS, TIA, PE in 2013. Pt reports she has experienced significant changes in cognition communication skills, indicating slower processing, decreased attention, reduced memory, and impaired executive functioning. Pt also demonstrated intermittent word finding difficulties in conversation, as pt stated "I know what I want to say but I can't say it." Usual use of association strategy to reduce dysnomia and improve recall reported and exhibited without prompting. Pt indicated she has trouble "following through" on household tasks, with example provided of knowing how to run dishwasher with inability to complete task. 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 She previously managed her family's finances,  with assistance now required. She endorses difficulty with comprehension of medical information related to her care. CLQT initiated this session; however, testing deferred to pt becoming overwhelmed/overstimulated. Pt accurately verbalized personal facts (self-correction x1 for age) and ID'd 10/12 symbols on symbol cancellation (although pt reported she felt  "nauseous" given visual stimlui and stated "I feel like my brain is scrambed"). Pt completed convergent naming with 100% accuracy, although pt stated "I would have given more detail before". SLP attempted assessment of clock drawing, in which pt became overtly overwhelmed and stated "I can't create that. I can tell you how but I really can't do that", which prompted break in formalized testing. Given significant impact on her functional abilities (examples listed above), SLP recommends skilled ST intervention to address cognitive communication to reduce patient frustration, decrease caregiver burden, and optimize QOL.    Speech Therapy Frequency 2x / week    Duration 8 weeks   or 17 total visits   Treatment/Interventions Compensatory strategies;Patient/family education;Functional tasks;Cueing hierarchy;Environmental controls;Cognitive reorganization;Multimodal communcation approach;SLP instruction and feedback;Internal/external aids;Compensatory techniques;Language facilitation    Potential to Achieve Goals Fair    Potential Considerations Medical prognosis;Severity of impairments    Consulted and Agree with Plan of Care Patient           Patient will benefit from skilled therapeutic intervention in order to improve the following deficits and impairments:   Cognitive communication deficit  Aphasia    Problem List Patient Active Problem List   Diagnosis Date Noted  . Hyperlipidemia 01/11/2020  . Astrocytoma of frontal lobe ([place]) 11/02/2019  . Goals of care, counseling/discussion 11/02/2019  . Abnormal brain MRI 12/07/2018  . Confusion 12/07/2018  . TIA (transient ischemic attack) 11/04/2018  . CVA (cerebral vascular accident) ([place]) 03/29/2018  . Essential hypertension 06/20/2016  . Suicidal ideation 09/20/2013  . MDD (major depressive disorder) 09/20/2013  . Menorrhagia 02/10/2013  . Pulmonary embolism ([place]) 09/09/2012  . History of pulmonary embolism 09/06/2012  . Acute respiratory  failure with hypoxia ([place]) 02/04/2012  . Bilateral pulmonary embolism ([place]) 02/04/2012  . Hypokalemia 02/04/2012  . Hyponatremia 02/04/2012  . Elevation of cardiac enzymes 02/04/2012  . Family History of hypercoagulable state 02/04/2012  . History of squamous cell carcinoma of skin 12/17/2011  . History  of basal cell carcinoma 12/17/2011  . Depression 12/17/2011  . Iron deficiency anemia - severe 12/17/2011  . Melanoma ([place])     Alinda Deem, MA CCC-SLP 01/24/2021, 1:06 PM  [facility] [address] [name], [place], [address] Phone: [phone]   Fax:  [phone]  Name: BENNETT VANSCYOC MRN: [id number] Date of Birth: 1961/04/18

## 2021-01-25 ENCOUNTER — Telehealth: Payer: Self-pay | Admitting: Physical Therapy

## 2021-01-25 NOTE — Telephone Encounter (Signed)
Faxed to provider as provider is not in Epic;    Dr. Elwin Monroe, Kimberly Monroe was evaluated by physical therapy on 01/24/21.  The patient would benefit from an occupational therapy evaluation for decr RUE strength and ROM.   If you agree, please place an order in [facility] in [facility] or fax the order to [phone]. Thank you, Janann August, PT, DPT 01/25/21 4:23 PM    [facility] [address] [place], [place]  [address] Phone:  [phone] Fax:  [phone]

## 2021-01-29 ENCOUNTER — Encounter: Payer: 59 | Admitting: Physical Therapy

## 2021-01-30 ENCOUNTER — Telehealth: Payer: Self-pay

## 2021-01-30 ENCOUNTER — Other Ambulatory Visit: Payer: Self-pay

## 2021-01-30 ENCOUNTER — Inpatient Hospital Stay: Payer: 59 | Attending: Hematology & Oncology

## 2021-01-30 ENCOUNTER — Encounter: Payer: Self-pay | Admitting: Hematology & Oncology

## 2021-01-30 ENCOUNTER — Inpatient Hospital Stay (HOSPITAL_BASED_OUTPATIENT_CLINIC_OR_DEPARTMENT_OTHER): Payer: 59 | Admitting: Hematology & Oncology

## 2021-01-30 VITALS — BP 138/82 | HR 62 | Temp 98.3°F | Resp 16

## 2021-01-30 DIAGNOSIS — C4322 Malignant melanoma of left ear and external auricular canal: Secondary | ICD-10-CM | POA: Insufficient documentation

## 2021-01-30 DIAGNOSIS — Z86711 Personal history of pulmonary embolism: Secondary | ICD-10-CM | POA: Insufficient documentation

## 2021-01-30 DIAGNOSIS — Z923 Personal history of irradiation: Secondary | ICD-10-CM | POA: Diagnosis not present

## 2021-01-30 DIAGNOSIS — Z8673 Personal history of transient ischemic attack (TIA), and cerebral infarction without residual deficits: Secondary | ICD-10-CM | POA: Insufficient documentation

## 2021-01-30 DIAGNOSIS — C713 Malignant neoplasm of parietal lobe: Secondary | ICD-10-CM | POA: Diagnosis present

## 2021-01-30 DIAGNOSIS — Z7901 Long term (current) use of anticoagulants: Secondary | ICD-10-CM | POA: Insufficient documentation

## 2021-01-30 DIAGNOSIS — C711 Malignant neoplasm of frontal lobe: Secondary | ICD-10-CM | POA: Diagnosis not present

## 2021-01-30 DIAGNOSIS — D509 Iron deficiency anemia, unspecified: Secondary | ICD-10-CM | POA: Diagnosis not present

## 2021-01-30 LAB — CBC WITH DIFFERENTIAL (CANCER CENTER ONLY)
Abs Immature Granulocytes: 0.01 10*3/uL (ref 0.00–0.07)
Basophils Absolute: 0 10*3/uL (ref 0.0–0.1)
Basophils Relative: 1 %
Eosinophils Absolute: 0.2 10*3/uL (ref 0.0–0.5)
Eosinophils Relative: 3 %
HCT: 42.2 % (ref 36.0–46.0)
Hemoglobin: 14.4 g/dL (ref 12.0–15.0)
Immature Granulocytes: 0 %
Lymphocytes Relative: 28 %
Lymphs Abs: 1.8 10*3/uL (ref 0.7–4.0)
MCH: 31.2 pg (ref 26.0–34.0)
MCHC: 34.1 g/dL (ref 30.0–36.0)
MCV: 91.5 fL (ref 80.0–100.0)
Monocytes Absolute: 0.4 10*3/uL (ref 0.1–1.0)
Monocytes Relative: 6 %
Neutro Abs: 4.1 10*3/uL (ref 1.7–7.7)
Neutrophils Relative %: 62 %
Platelet Count: 273 10*3/uL (ref 150–400)
RBC: 4.61 MIL/uL (ref 3.87–5.11)
RDW: 13.2 % (ref 11.5–15.5)
WBC Count: 6.5 10*3/uL (ref 4.0–10.5)
nRBC: 0 % (ref 0.0–0.2)

## 2021-01-30 LAB — CMP (CANCER CENTER ONLY)
ALT: 27 U/L (ref 0–44)
AST: 18 U/L (ref 15–41)
Albumin: 3.8 g/dL (ref 3.5–5.0)
Alkaline Phosphatase: 104 U/L (ref 38–126)
Anion gap: 6 (ref 5–15)
BUN: 15 mg/dL (ref 6–20)
CO2: 29 mmol/L (ref 22–32)
Calcium: 9.3 mg/dL (ref 8.9–10.3)
Chloride: 106 mmol/L (ref 98–111)
Creatinine: 0.7 mg/dL (ref 0.44–1.00)
GFR, Estimated: >60 mL/min (ref >60)
Glucose, Bld: 106 mg/dL — ABNORMAL HIGH (ref 70–99)
Potassium: 4.2 mmol/L (ref 3.5–5.1)
Sodium: 141 mmol/L (ref 135–145)
Total Bilirubin: 0.4 mg/dL (ref 0.3–1.2)
Total Protein: 6.4 g/dL — ABNORMAL LOW (ref 6.5–8.1)

## 2021-01-30 LAB — LACTATE DEHYDROGENASE: LDH: 108 U/L (ref 98–192)

## 2021-01-30 NOTE — Progress Notes (Signed)
Hematology and Oncology Follow Up Visit  Kimberly Monroe [id number] 25-Nov-1960 60 y.o. 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 Kimberly Monroe is here today for for follow-up.  She comes in with one of her daughters.  She is doing okay although the neurocognitive issues are becoming more of a problem for her.  She is going to have to go out on disability because of the neurocognitive issues.  I just feel bad for her.  I know she really enjoys working.  She has had no problems with nausea or vomiting.  She has had no issues with bowels or bladder.  She is on Xarelto.  She is doing well with the Xarelto.  She has had no bleeding.  She is going to have the MRI of the brain in May.  This will be done down at [facility].  She has had no problems with cough or shortness of breath.  There is been no issues with COVID.  Currently, I would say performance status is ECOG 2.   Medications:  Allergies as of 01/30/2021      Reactions   Lime Flavor [flavoring Agent] Anaphylaxis   Amoxicillin Other (See Comments)   Patient stated,"my mom told me to never take it. I don't even know what type of reaction."   Penicillins Other (See Comments)   Did it involve swelling of the face/tongue/throat, SOB, or low BP? Unknown Did it involve sudden or severe rash/hives, skin peeling, or any reaction on the inside of your mouth or nose? Unknown Did you need to seek medical attention at a hospital or doctor's office? Unknown When did it last happen? Childhood allergy If all above answers are "NO", may proceed with cephalosporin use. Patient stated,"I was told not to take it."       Medication List       Accurate as of January 30, 2021  9:13 AM. If you have any questions, ask your nurse or doctor.        STOP taking these medications   DSS 100 MG Caps Stopped by: Volanda Napoleon, MD   memantine 10 MG tablet Commonly known as: NAMENDA Stopped by: Volanda Napoleon, MD   ondansetron 4 MG disintegrating tablet Commonly known as: ZOFRAN-ODT Stopped by: Volanda Napoleon, MD   ondansetron 8 MG disintegrating tablet Commonly known as: ZOFRAN-ODT Stopped by: Volanda Napoleon, MD     TAKE these medications   atorvastatin 40 MG tablet Commonly known as: LIPITOR Take 1 tablet (40 mg total) by mouth daily.   cyanocobalamin 1000 MCG tablet Take by mouth daily.   lisinopril 10 MG tablet Commonly known as: ZESTRIL TAKE 1 TABLET (10 MG TOTAL) BY MOUTH DAILY. PLEASE SCHEDULE FOLLOW UP FOR REFILLS.   LORazepam 1 MG tablet Commonly known as: ATIVAN Take 1 mg by mouth as needed.   modafinil 200 MG tablet Commonly known as: PROVIGIL Take 200 mg by mouth daily.   rivaroxaban 20 MG Tabs tablet Commonly known as: Xarelto TAKE 1 TABLET (20 MG TOTAL) BY MOUTH DAILY WITH SUPPER.   Vitamin D (Ergocalciferol) 1.25 MG (50000 UNIT) Caps capsule Commonly known as: DRISDOL TAKE  1 CAPSULE BY MOUTH ONE TIME PER WEEK       Allergies:  Allergies  Allergen Reactions  . Lime Flavor [Flavoring Agent] Anaphylaxis  . Amoxicillin Other (See Comments)    Patient stated,"my mom told me to never take it. I don't even know what type of reaction."  . Penicillins Other (See Comments)    Did it involve swelling of the face/tongue/throat, SOB, or low BP? Unknown Did it involve sudden or severe rash/hives, skin peeling, or any reaction on the inside of your mouth or nose? Unknown Did you need to seek medical attention at a hospital or doctor's office? Unknown When did it last happen? Childhood allergy If all above answers are "NO", may proceed with cephalosporin  use.   Patient stated,"I was told not to take it."     Past Medical History, Surgical history, Social history, and Family History were reviewed and updated.  Review of Systems: Review of Systems  Constitutional: Negative.   Eyes: Negative.   Respiratory: Negative.   Cardiovascular: Negative.   Gastrointestinal: Negative.   Genitourinary: Negative.   Musculoskeletal: Negative.   Skin: Negative.   Neurological: Negative.   Endo/Heme/Allergies: Negative.   Psychiatric/Behavioral: Negative.      Physical Exam:  oral temperature is 98.3 F (36.8 C). Her blood pressure is 138/82 and her pulse is 62. Her respiration is 16 and oxygen saturation is 99%.   Wt Readings from Last 3 Encounters:  12/05/20 227 lb 12.8 oz (103.3 kg)  11/23/20 186 lb 12.8 oz (84.7 kg)  11/06/20 231 lb (104.8 kg)    Physical Exam Vitals reviewed.  HENT:     Head: Normocephalic and atraumatic.     Comments: On examination of her cranium, she has a healing craniectomy scar in the left parietal area.  This is healing nicely.    Ears:     Comments: She has a dressing on the left earlobe.  There is no adenopathy in the neck. Eyes:     Pupils: Pupils are equal, round, and reactive to light.  Neck:     Comments: She has a dressing in the left supraclavicular region where the skin graft was taken. Cardiovascular:     Rate and Rhythm: Normal rate and regular rhythm.     Heart sounds: Normal heart sounds.  Pulmonary:     Effort: Pulmonary effort is normal.     Breath sounds: Normal breath sounds.  Abdominal:     General: Bowel sounds are normal.     Palpations: Abdomen is soft.  Musculoskeletal:        General: No tenderness or deformity. Normal range of motion.     Cervical back: Normal range of motion.  Lymphadenopathy:     Cervical: No cervical adenopathy.  Skin:    General: Skin is warm and dry.     Findings: No erythema or rash.  Neurological:     Mental Status: She is alert.     Comments:  Neurological exam shows some decrease in her cognitive function.  She has quite a while to figure out what to say.  She has to talk slowly.  She is okay had word finding but again it does take a while.  She has some slight symmetrical decreased strength in her legs.  Her cranial nerves seem to be intact.  Psychiatric:     Comments: She has little bit of a flat affect.      Lab Results  Component Value Date   WBC 6.5  01/30/2021   HGB 14.4 01/30/2021   HCT 42.2 01/30/2021   MCV 91.5 01/30/2021   PLT 273 01/30/2021   Lab Results  Component Value Date   FERRITIN 105 07/26/2020   IRON 127 07/26/2020   TIBC 291 07/26/2020   UIBC 164 07/26/2020   IRONPCTSAT 44 07/26/2020   Lab Results  Component Value Date   RETICCTPCT 1.5 09/06/2012   RBC 4.61 01/30/2021   RETICCTABS 67.7 09/06/2012   No results found for: Nils Pyle [facility] Lab Results  Component Value Date   IGGSERUM 840 12/05/2020   IGA 294 12/05/2020   IGMSERUM 70 12/05/2020   No results found for: Odetta Pink, SPEI   Chemistry      Component Value Date/Time   NA 141 01/30/2021 0804   NA 141 07/23/2016 0956   K 4.2 01/30/2021 0804   K 4.2 07/23/2016 0956   CL 106 01/30/2021 0804   CO2 29 01/30/2021 0804   CO2 25 07/23/2016 0956   BUN 15 01/30/2021 0804   BUN 14.6 07/23/2016 0956   CREATININE 0.70 01/30/2021 0804   CREATININE 0.7 07/23/2016 0956      Component Value Date/Time   CALCIUM 9.3 01/30/2021 0804   CALCIUM 9.3 07/23/2016 0956   ALKPHOS 104 01/30/2021 0804   ALKPHOS 97 07/23/2016 0956   AST 18 01/30/2021 0804   AST 25 07/23/2016 0956   ALT 27 01/30/2021 0804   ALT 27 07/23/2016 0956   BILITOT 0.4 01/30/2021 0804   BILITOT 0.30 07/23/2016 0956       Impression and Plan: Ms. Cowing is a very pleasant 60 yo caucasian female with history of idiopathic PE which has since resolved.   She is followed at [facility] for the  astrocytoma.  They were managing her Temodar.  She completed 6 months of Temodar in October 2021.  It will be interesting to see what the MRI of the brain shows.  I know the last MRI did show that she had a CVA with the thalamus.  Hopefully, there will be any issues with respect to recurrence.  I will plan to see her back in 3 months now.  I do not see any issues with her on the Xarelto.  She is doing well with the Xarelto with no evidence of thromboembolic disease.  Marland Kitchen  Volanda Napoleon, MD 4/27/20229:13 AM

## 2021-01-30 NOTE — Telephone Encounter (Signed)
At [facility] for 01/30/21 pt req to view appts on my chart and req am appts, done   Tor Tsuda

## 2021-01-31 ENCOUNTER — Ambulatory Visit: Payer: 59 | Admitting: Physical Therapy

## 2021-01-31 ENCOUNTER — Encounter: Payer: Self-pay | Admitting: Physical Therapy

## 2021-01-31 ENCOUNTER — Ambulatory Visit: Payer: 59

## 2021-01-31 ENCOUNTER — Encounter: Payer: 59 | Admitting: Physical Therapy

## 2021-01-31 VITALS — BP 122/81 | HR 59

## 2021-01-31 DIAGNOSIS — R2681 Unsteadiness on feet: Secondary | ICD-10-CM

## 2021-01-31 NOTE — Therapy (Signed)
[facility] [address] [place], [place], [address] Phone: [phone]   Fax:  [phone]  Physical Therapy Treatment - Arrived No Charge   Patient Details  Name: Kimberly Monroe MRN: [id number] Date of Birth: 06/13/1961 Referring Provider (PT): Yasmeen Rauf MD   Encounter Date: 01/31/2021   PT End of Session - 01/31/21 0832    Visit Number 6   arrived no charge   Number of Visits 17    Date for PT Re-Evaluation 02/12/21    Authorization Type UHC VL- 23 PT, with 19 remaining/ 30 ST - 20 remaining    Authorization - Number of Visits 19    PT Start Time 0801    PT Stop Time 0823    PT Time Calculation (min) 22 min           Past Medical History:  Diagnosis Date  . Allergy    seasonal  . Anemia   . Anxiety   . 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 Basal cell cancer   . Cognitive changes   . Depression   . Goals of care, counseling/discussion 11/02/2019  . History of blood clotting disorder   . Hx of cerebral artery stenosis   . Hypertension   . Melanoma (HCC) 2009   insitu  . Menorrhagia 02/10/2013  . Migraine    history of/none in years  . Ovarian cancer (HCC) 1995   left ovary  . Pulmonary embolism (HCC)   . Squamous cell carcinoma   . Stroke (HCC)     Past Surgical History:  Procedure Laterality Date  . ABDOMINAL HYSTERECTOMY  03/06/2013  . BREAST DUCTAL SYSTEM EXCISION  2009   L intraductal papilloma  . CEREBRAL ANGIOGRAM  2020  . CESAREAN SECTION  1996, 2000   twins, singleton  . IR ANGIO INTRA EXTRACRAN SEL COM CAROTID INNOMINATE BILAT MOD SED  11/05/2018  . IR ANGIO VERTEBRAL SEL SUBCLAVIAN INNOMINATE UNI L MOD SED  11/05/2018  . IR ANGIO VERTEBRAL SEL VERTEBRAL UNI R MOD SED  11/05/2018  . IR US GUIDE VASC ACCESS RIGHT  11/05/2018  . laparoscopy with ovarian cystectomy  1994   ovarian torsion  . 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 MOHS SURGERY  2009   Dr Albertini    Vitals:    01/31/21 0809  BP: 122/81  Pulse: (!) 59     Subjective Assessment - 01/31/21 0806    Subjective Not feeling good today. Feels like her head is sore. Almost cancelled her session. Does not have a fever.    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    Currently in Pain? No/denies           See plan section.                             PT Short Term Goals - 01/24/21 1109      PT SHORT TERM GOAL #1   Title Ind with initial HEP    Baseline pt has not been performing HEP consistently due to cognitive deficits - daughter not present with pt to review      Status Not Met    Target Date 01/15/21      PT SHORT TERM GOAL #2   Title Patient compliant with use of appropriate AD at home and in the community to prevent further falls.    Baseline uses SPC on 01/24/21    Status Achieved      PT SHORT TERM GOAL #3   Title Improved 5x sit to stand to 30 seconds    Baseline 62 seconds on 01/24/21    Status Not Met             PT Long Term Goals - 12/18/20 1314      PT LONG TERM GOAL #1   Title Ind with advanced HEP to maintain gains in strength and ROM    Time 8    Period Weeks    Status New    Target Date 02/12/21      PT LONG TERM GOAL #2   Title Improved nomal TUG(on carpet if tolerated) with AD to <= 10 sec to decrease fall risk    Time 8    Period Weeks    Status New      PT LONG TERM GOAL #3   Title Improved 5x sit to stand to < 20 sec to decrease likelihood of falls    Time 8    Period Weeks    Status New      PT LONG TERM GOAL #4   Title Improved BERG score to 40/56 to decrease likelihood of falls.    Time 8    Period Weeks    Status New      PT LONG TERM GOAL #5    Title Improve right UE flexion to >= 120 deg to improve function of Rt UE    Time 8    Period Weeks    Status New                 Plan - 01/31/21 5027    Clinical Impression Statement Pt arrives to session reporting that she is not feeling well and almost cancelled today's appt. Saw the doctor yesterday and was feeling good (and reports blood work was good). BP WNL. Pt not wanting to participate in therapy today and wants to go home and lay down. Discussed with pt current PT visit limit and that if she is not feeling well to participate in therapy, she can call front office to cancel appt. Pt also asking about where to purchase a quad tip for her [facility] - printed out information from [employer] on where to purchase. Walked pt out of therapy session and made sure pt got in her ride safely from [place] transportation to take her home.    Personal Factors and Comorbidities Comorbidity 3+    Comorbidities brain cancer, cva, headaches, stenosis, dizziness    Examination-Activity Limitations Locomotion Level;Stairs    Rehab Potential Good    PT Frequency 2x / week    PT Duration 8 weeks    PT Treatment/Interventions ADLs/Self Care Home Management;Neuromuscular re-education;Balance training;Therapeutic exercise;Therapeutic activities;Stair training;Gait training;Patient/family education;Manual techniques;Dry needling    PT Next Visit Plan may sure pt gets scheduled for OT. treating in more quiet room if possible, gait training with SPC (with quad tip) vs. RW (pt has an order for one and has not picked one up) for safety with gait, standing balance at countertop/static balance with decr UE support, cervical ROM (ext and rotation),  will need a vestibular assessment when appropriate.  PT Home Exercise Plan [id number]    Recommended Other Services OT eval - referral received.    Consulted and Agree with Plan of Care Patient           Patient will benefit from skilled therapeutic intervention in order  to improve the following deficits and impairments:  Abnormal gait,Decreased range of motion,Impaired UE functional use,Decreased balance,Decreased strength,Postural dysfunction,Impaired flexibility,Dizziness  Visit Diagnosis: Unsteadiness on feet     Problem List Patient Active Problem List   Diagnosis Date Noted  . Hyperlipidemia 01/11/2020  . Astrocytoma of frontal lobe ([place]) 11/02/2019  . Goals of care, counseling/discussion 11/02/2019  . Abnormal brain MRI 12/07/2018  . Confusion 12/07/2018  . TIA (transient ischemic attack) 11/04/2018  . CVA (cerebral vascular accident) ([place]) 03/29/2018  . Essential hypertension 06/20/2016  . Suicidal ideation 09/20/2013  . MDD (major depressive disorder) 09/20/2013  . Menorrhagia 02/10/2013  . Pulmonary embolism ([place]) 09/09/2012  . History of pulmonary embolism 09/06/2012  . Acute respiratory failure with hypoxia ([place]) 02/04/2012  . Bilateral pulmonary embolism ([place]) 02/04/2012  . Hypokalemia 02/04/2012  . Hyponatremia 02/04/2012  . Elevation of cardiac enzymes 02/04/2012  . Family History of hypercoagulable state 02/04/2012  . History of squamous cell carcinoma of skin 12/17/2011  . History  of basal cell carcinoma 12/17/2011  . Depression 12/17/2011  . Iron deficiency anemia - severe 12/17/2011  . Melanoma ([place])     Arliss Journey, PT,DPT  01/31/2021, 8:38 AM  [facility] [address] [place], [place], [address] Phone: [phone]   Fax:  [phone]  Name: GENA LASKI MRN: [id number] Date of Birth: Mar 21, 1961

## 2021-02-07 ENCOUNTER — Ambulatory Visit: Payer: 59 | Admitting: Physical Therapy

## 2021-02-07 ENCOUNTER — Ambulatory Visit: Payer: 59 | Attending: Specialist

## 2021-02-07 ENCOUNTER — Other Ambulatory Visit: Payer: Self-pay

## 2021-02-07 DIAGNOSIS — R278 Other lack of coordination: Secondary | ICD-10-CM | POA: Insufficient documentation

## 2021-02-07 DIAGNOSIS — R2681 Unsteadiness on feet: Secondary | ICD-10-CM | POA: Insufficient documentation

## 2021-02-07 DIAGNOSIS — R2689 Other abnormalities of gait and mobility: Secondary | ICD-10-CM | POA: Diagnosis not present

## 2021-02-07 DIAGNOSIS — M6281 Muscle weakness (generalized): Secondary | ICD-10-CM | POA: Insufficient documentation

## 2021-02-07 DIAGNOSIS — R42 Dizziness and giddiness: Secondary | ICD-10-CM | POA: Insufficient documentation

## 2021-02-07 DIAGNOSIS — R4701 Aphasia: Secondary | ICD-10-CM | POA: Diagnosis not present

## 2021-02-07 DIAGNOSIS — M25611 Stiffness of right shoulder, not elsewhere classified: Secondary | ICD-10-CM | POA: Insufficient documentation

## 2021-02-07 DIAGNOSIS — R41841 Cognitive communication deficit: Secondary | ICD-10-CM | POA: Insufficient documentation

## 2021-02-07 DIAGNOSIS — M25511 Pain in right shoulder: Secondary | ICD-10-CM | POA: Diagnosis not present

## 2021-02-07 DIAGNOSIS — R41840 Attention and concentration deficit: Secondary | ICD-10-CM | POA: Diagnosis not present

## 2021-02-07 NOTE — Therapy (Signed)
[facility] [address] [place], [place], [address] Phone: [phone]   Fax:  [phone]  Physical Therapy Treatment  Patient Details  Name: Kimberly Monroe MRN: [id number] Date of Birth: [birth year] Referring Provider (PT): Malachy Chamber MD   Encounter Date: 02/07/2021   PT End of Session - 02/07/21 1150    Visit Number 7    Number of Visits 17    Date for PT Re-Evaluation 02/12/21    Authorization Type UHC VL- 23 PT, with 19 remaining/ 4 ST - 20 remaining    Authorization - Number of Visits 19    PT Start Time 1145    PT Stop Time 1230    PT Time Calculation (min) 45 min    Activity Tolerance Patient limited by pain    Behavior During Therapy [facility] for tasks assessed/performed           Past Medical History:  Diagnosis Date  . Allergy    seasonal  . Anemia   . Anxiety   . Astrocytoma of frontal lobe ([place]) 11/02/2019  . Basal cell cancer   . Cognitive changes   . Depression   . Goals of care, counseling/discussion 11/02/2019  . History of blood clotting disorder   . Hx of cerebral artery stenosis   . Hypertension   . Melanoma ([place]) 2009   insitu  . Menorrhagia 02/10/2013  . Migraine    history of/none in years  . Ovarian cancer ([place]) 1995   left ovary  . Pulmonary embolism ([place])   . Squamous cell carcinoma   . Stroke [facility])     Past Surgical History:  Procedure Laterality Date  . ABDOMINAL HYSTERECTOMY  03/06/2013  . BREAST DUCTAL SYSTEM EXCISION  2009   L intraductal papilloma  . CEREBRAL ANGIOGRAM  2020  . [place]   twins, singleton  . IR ANGIO INTRA EXTRACRAN SEL COM CAROTID INNOMINATE BILAT MOD SED  11/05/2018  . IR ANGIO VERTEBRAL SEL SUBCLAVIAN INNOMINATE UNI L MOD SED  11/05/2018  . IR ANGIO VERTEBRAL SEL VERTEBRAL UNI R MOD SED  11/05/2018  . IR US GUIDE VASC ACCESS RIGHT  11/05/2018  . laparoscopy with ovarian cystectomy  1994   ovarian torsion  . LAPAROTOMY  1995    ovarian thecoma  . MOHS SURGERY  2009   Dr Link Snuffer    There were no vitals filed for this visit.   Subjective Assessment - 02/07/21 1153    Subjective Still having a HA but feels she is okay to participate in therapy.  Driver arrived early and patient tried to exit her house without assistance; lost her balance on the stairs and fell against the shelving; no injury.  Asking about therapy filling out parts of her disability paperwork related to mobility.    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    Currently in Pain? Yes                   Vestibular Assessment - 02/07/21 1158      Vestibular Assessment   General Observation ambulating cautiously due to fall this morning; typically tries to avoid stairs due to imbalance.  Symptom Behavior   Subjective history of current problem Pt feels like her "brain is moving, back and down". Denies spinning vertigo.  Pt reports it is a sense of being overstimulated by movement, lights, sounds, etc.  Also results in a HA and nausea.  Keeps eyes closed due to sensitivity.  Reports eye fatigue when concentrating on computer.  Reports difficulty hearing with a lot of background noise.  Reports mild tinnitus.    Type of Dizziness  Imbalance;Unsteady with head/body turns;"Funny feeling in head"    Frequency of Dizziness Daily    Duration of Dizziness 15-30 minutes    Symptom Nature Spontaneous    Aggravating Factors Comment   over stimulation; sensory   Relieving Factors Comments;Dark room;Closing eyes   quiet environment   Progression of Symptoms Worse      Oculomotor Exam   Spontaneous Absent    Gaze-induced  Right beating nystagmus with R gaze;Left beating  nystagmus with L gaze    Smooth Pursuits Intact    Saccades Hypometric;Poor trajectory      Oculomotor Exam-Fixation Suppressed    Left Head Impulse unable to test due to closing eyes, dizziness    Right Head Impulse unable to test due to closing eyes, dizziness      Vestibulo-Ocular Reflex   VOR to Slow Head Movement Normal    VOR Cancellation Comment   dizziness and nausea     Positional Sensitivities   Nose to Right Knee Lightheadedness   sense of falling   Right Knee to Sitting Lightedness    Nose to Left Knee Lightheadedness    Left Knee to Sitting Lightheadedness    Head Turning x 5 Moderate dizziness   3 reps   Head Nodding x 5 Moderate dizziness   3 reps   Pivot Right in Standing Mild dizziness    Pivot Left in Standing Mild dizziness                            PT Education - 02/07/21 1333    Education Details clinical findings, visual motion sensitivity, focus of vestibular rehab    Person(s) Educated Patient    Methods Explanation    Comprehension Verbalized understanding            PT Short Term Goals - 01/24/21 1109      PT SHORT TERM GOAL #1   Title Ind with initial HEP    Baseline pt has not been performing HEP consistently due to cognitive deficits - daughter not present with pt to review    Status Not Met    Target Date 01/15/21      PT SHORT TERM GOAL #2   Title Patient compliant with use of appropriate AD at home and in the community to prevent further falls.    Baseline uses SPC on 01/24/21    Status Achieved      PT SHORT TERM GOAL #3   Title Improved 5x sit to stand to 30 seconds    Baseline 62 seconds on 01/24/21    Status Not Met             PT Long Term Goals - 12/18/20 1314      PT LONG TERM GOAL #1   Title Ind with advanced HEP to maintain gains in strength and ROM    Time 8    Period Weeks    Status New    Target Date 02/12/21  PT LONG TERM GOAL #2   Title Improved nomal TUG(on carpet if tolerated) with  AD to <= 10 sec to decrease fall risk    Time 8    Period Weeks    Status New      PT LONG TERM GOAL #3   Title Improved 5x sit to stand to < 20 sec to decrease likelihood of falls    Time 8    Period Weeks    Status New      PT LONG TERM GOAL #4   Title Improved BERG score to 40/56 to decrease likelihood of falls.    Time 8    Period Weeks    Status New      PT LONG TERM GOAL #5   Title Improve right UE flexion to >= 120 deg to improve function of Rt UE    Time 8    Period Weeks    Status New                 Plan - 02/07/21 1334    Clinical Impression Statement Pt able to tolerate session today and was able to participate in vestibular assessment in quiet, dimly lit room with multiple rest breaks.  Assessment revealed significant visual and motion sensitivity and central vertigo.  Will begin to incorporate vestibular and habituation training next session.    Personal Factors and Comorbidities Comorbidity 3+    Comorbidities brain cancer, cva, headaches, stenosis, dizziness    Examination-Activity Limitations Locomotion Level;Stairs    Rehab Potential Good    PT Frequency 2x / week    PT Duration 8 weeks    PT Treatment/Interventions ADLs/Self Care Home Management;Neuromuscular re-education;Balance training;Therapeutic exercise;Therapeutic activities;Stair training;Gait training;Patient/family education;Manual techniques;Dry needling    PT Next Visit Plan Treat in private room with lights dimmed.  Vestibular assessment completed - is very visually/motion sensitive/central vertigo.  Start with seated, slow VOR - x1 viewing very slow head movements focusing on keeping eyes open and on target, low number of reps).  Balance with decreased UE support, head turns. gait training with SPC (with quad tip) vs. RW (pt has an order for one and has not picked one up) for safety with gait, standing balance at countertop/static balance with decr UE support, cervical ROM (ext and rotation).     PT Home Exercise Plan [id number]    Consulted and Agree with Plan of Care Patient           Patient will benefit from skilled therapeutic intervention in order to improve the following deficits and impairments:  Abnormal gait,Decreased range of motion,Impaired UE functional use,Decreased balance,Decreased strength,Postural dysfunction,Impaired flexibility,Dizziness  Visit Diagnosis: Unsteadiness on feet  Muscle weakness (generalized)     Problem List Patient Active Problem List   Diagnosis Date Noted  . Hyperlipidemia 01/11/2020  . Astrocytoma of frontal lobe ([place]) 11/02/2019  . Goals of care, counseling/discussion 11/02/2019  . Abnormal brain MRI 12/07/2018  . Confusion 12/07/2018  . TIA (transient ischemic attack) 11/04/2018  . CVA (cerebral vascular accident) ([place]) 03/29/2018  . Essential hypertension 06/20/2016  . Suicidal ideation 09/20/2013  . MDD (major depressive disorder) 09/20/2013  . Menorrhagia 02/10/2013  . Pulmonary embolism ([place]) 09/09/2012  . History of pulmonary embolism 09/06/2012  . Acute respiratory failure with hypoxia ([place]) 02/04/2012  . Bilateral pulmonary embolism ([place]) 02/04/2012  . Hypokalemia 02/04/2012  . Hyponatremia 02/04/2012  . Elevation of cardiac enzymes 02/04/2012  . Family History of hypercoagulable state 02/04/2012  . History of squamous  cell carcinoma of skin 12/17/2011  . History  of basal cell carcinoma 12/17/2011  . Depression 12/17/2011  . Iron deficiency anemia - severe 12/17/2011  . Melanoma ([place])    Rico Junker, PT, DPT 02/07/21    1:40 PM    [facility] [address] [place], [place], [address] Phone: [phone]   Fax:  [phone]  Name: Kimberly Monroe MRN: [id number] Date of Birth: Jan 28, 1961

## 2021-02-07 NOTE — Therapy (Signed)
[facility] [address] [place], [place], [address] Phone: [phone]   Fax:  [phone]  Speech Language Pathology Treatment  Patient Details  Name: Kimberly Monroe MRN: [id number] Date of Birth: [birth year] Referring Provider (SLP): Malachy Chamber, MD   Encounter Date: 02/07/2021   End of Session - 02/07/21 1452    Visit Number 2    Number of Visits 17    Date for SLP Re-Evaluation 04/24/21    Authorization Type UHC    Authorization Time Period 62 ST    SLP Start Time 8185   pt arrived late from PT   SLP Stop Time  1315    SLP Time Calculation (min) 37 min    Activity Tolerance Patient limited by fatigue;Other (comment)   external and internal distractions          Past Medical History:  Diagnosis Date  . Allergy    seasonal  . Anemia   . Anxiety   . 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 Basal cell cancer   . Cognitive changes   . Depression   . Goals of care, counseling/discussion 11/02/2019  . History of blood clotting disorder   . Hx of cerebral artery stenosis   . Hypertension   . Melanoma ([place]) 2009   insitu  . Menorrhagia 02/10/2013  . Migraine    history of/none in years  . Ovarian cancer ([place]) 1995   left ovary  . Pulmonary embolism ([place])   . Squamous cell carcinoma   . Stroke [facility])     Past Surgical History:  Procedure Laterality Date  . ABDOMINAL HYSTERECTOMY  03/06/2013  . BREAST DUCTAL SYSTEM EXCISION  2009   L intraductal papilloma  . CEREBRAL ANGIOGRAM  2020  . [place]   twins, singleton  . IR ANGIO INTRA EXTRACRAN SEL COM CAROTID INNOMINATE BILAT MOD SED  11/05/2018  . IR ANGIO VERTEBRAL SEL SUBCLAVIAN INNOMINATE UNI L MOD SED  11/05/2018  . IR ANGIO VERTEBRAL SEL VERTEBRAL UNI R MOD SED  11/05/2018  . IR US GUIDE VASC ACCESS RIGHT  11/05/2018  . laparoscopy with ovarian cystectomy  1994   ovarian torsion  . 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 MOHS  SURGERY  2009   Dr Link Snuffer    There were no vitals filed for this visit.   Subjective Assessment - 02/07/21 1238    Subjective "I've been trying to hang in there"    Currently in Pain? Yes    Pain Score --   "those scales don't work for me"   Pain Location Head    Pain Descriptors / Indicators Aching                 ADULT SLP TREATMENT - 02/07/21 1233      General Information   Behavior/Cognition Alert;Cooperative;Pleasant mood;Distractible;Requires cueing      Treatment Provided   Treatment provided Cognitive-Linquistic      Cognitive-Linquistic Treatment   Treatment focused on Cognition;Patient/family/caregiver education    Skilled Treatment Pt arrived a few minutes late from PT session. Pt indicated feeling over-whelmed by auditory/visual stimuli in gym area. Pt also reports she didn't sleep well last night and felt "out of sorts" today. Fall reported this morning as her cane suspectedly got caught in doorway and she fell onto nearby shelving. Session focused on discussion of pt's internal and external distractions impacting her cognitive processing, particularly navigating and completing disability paperwork.  SLP recommended pt receive assistance to complete paperwork to facilitate accurate comprehension and completion of task due to cognitive communication deficits. Given pt's baseline high level functioning and independence, pt indicated concern for only being able to succesfully complete 1-step at a time with compensations compared to previous ability to follow multiple step directions, multitask, and analyze novel information. SLP recommended patient continue to compensate whenever possible and simplify tasks for improved accuracy. CLQT unable to be completed this session. SLP provided handout with anomia strategies given increased word finding episodes reportedly recently. Pt accurately utilized description strategy x1 independently.      Assessment / Recommendations /  Plan   Plan Continue with current plan of care      Progression Toward Goals   Progression toward goals Progressing toward goals            SLP Education - 02/07/21 1451    Education Details cognitive impact of external/internal distractions and fatigue, need for compensations due to cognitive deficits, anomia strategies    Person(s) Educated Patient    Methods Explanation;Demonstration;Handout    Comprehension Verbalized understanding;Returned demonstration;Need further instruction            SLP Short Term Goals - 02/07/21 1232      SLP SHORT TERM GOAL #1   Title Pt will use 2 memory and attention compensations to aid successful completion of household tasks and other daily activities with occasional min A over 3 sessions.    Time 4    Period Weeks    Status On-going      SLP SHORT TERM GOAL #2   Title Pt will complete 2-3 step sequences related to household tasks for 4/5 opportunities with occassional min A over 3 sessions    Time 4    Period Weeks    Status On-going      SLP SHORT TERM GOAL #3   Title Pt will use word finding compensations in simple 10 minute conversations with rare min A over 2 sessions    Time 4    Period Weeks    Status On-going      SLP SHORT TERM GOAL #4   Title Pt will follow simple 4-5 step recipes for cooking at home with use of compensations given min A from family for 2/2 opportunities    Time 4    Period Weeks    Status On-going      SLP SHORT TERM GOAL #5   Title Pt will comprehend simple to mod complex medical information related to her care with use of clarifying questions and confirming statements over 2 sessions    Time 4    Period Weeks    Status On-going            SLP Long Term Goals - 02/07/21 1232      SLP LONG TERM GOAL #1   Title Pt will use 4 memory and attention compensations for successful completion of household tasks and other daily activities with rare min A from family over 2 sessions    Time 8    Period  Weeks    Status On-going      SLP LONG TERM GOAL #2   Title Pt will successfully complete 2 household tasks of choice with use of compensations and occasional min A over 2 sessions    Time 8    Period Weeks    Status On-going      SLP LONG TERM GOAL #3   Title Pt will use  word finding compensations in 15 min simple to mod complex conversations with rare min A over 2 sessions    Time 8    Period Weeks    Status On-going      SLP LONG TERM GOAL #4   Title Pt will comprehend mod complex to complex medical information related to her care with use of clarifying questions and confirming statements over 2 sessions    Time 8    Period Weeks    Status On-going      SLP LONG TERM GOAL #5   Title Pt will report reduced frustration related to cognitive communication skills by last ST session    Time 8    Period Weeks    Status On-going            Plan - 02/07/21 1453    Clinical Impression Statement Kimberly Monroe presents for ST intervention to address changes in cognitive linguistic skills due to malignant neoplasm of brain. Pt indicated feeling over-whelmed and fatigued given recent events. Report of internal and external distractions prompted discussion re: impact on cognitive functioning. Concern reported due to decline in functioning and ability to only successfully complete one-step at time, as pt previously could follow multi-step directions and multitask without difficulty. Significant change in cognitive communication baseline skills reported, in which SLP provided recommendations to rely on compensations and use one step instructions to aid successful completion of household tasks. Pt would benefit from additional assistance to complete complex or high level tasks due to processing deficits. Given significant impact on her functional abilities, SLP recommends skilled ST intervention to address cognitive communication to reduce patient frustration, decrease caregiver burden, and optimize QOL.     Speech Therapy Frequency 2x / week    Duration 8 weeks   or 17 total visits   Treatment/Interventions Compensatory strategies;Patient/family education;Functional tasks;Cueing hierarchy;Environmental controls;Cognitive reorganization;Multimodal communcation approach;SLP instruction and feedback;Internal/external aids;Compensatory techniques;Language facilitation    Potential to Achieve Goals Fair    Potential Considerations Medical prognosis;Severity of impairments    SLP Home Exercise Plan provided    Consulted and Agree with Plan of Care Patient           Patient will benefit from skilled therapeutic intervention in order to improve the following deficits and impairments:   Cognitive communication deficit  Aphasia    Problem List Patient Active Problem List   Diagnosis Date Noted  . Hyperlipidemia 01/11/2020  . 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 Goals of care, counseling/discussion 11/02/2019  . Abnormal brain MRI 12/07/2018  . Confusion 12/07/2018  . TIA (transient ischemic attack) 11/04/2018  . CVA (cerebral vascular accident) ([place]) 03/29/2018  . Essential hypertension 06/20/2016  . Suicidal ideation 09/20/2013  . MDD (major depressive disorder) 09/20/2013  . Menorrhagia 02/10/2013  . Pulmonary embolism ([place]) 09/09/2012  . History of pulmonary embolism 09/06/2012  . Acute respiratory failure with hypoxia ([place]) 02/04/2012  . Bilateral pulmonary embolism ([place]) 02/04/2012  . Hypokalemia 02/04/2012  . Hyponatremia 02/04/2012  . Elevation of cardiac enzymes 02/04/2012  . Family History of hypercoagulable state 02/04/2012  . History of squamous cell carcinoma of skin 12/17/2011  . History  of basal cell carcinoma 12/17/2011  . Depression 12/17/2011  . Iron deficiency anemia - severe 12/17/2011  . Melanoma ([place])     Alinda Deem, MA CCC-SLP 02/07/2021, 3:02 PM  [facility] [address] [place], [place], [address] Phone: [phone]   Fax:  [phone]   Name:  Kimberly Monroe MRN: [id number] Date of Birth: [birth year]

## 2021-02-07 NOTE — Patient Instructions (Signed)
   Bring a recipe card to next therapy session      When you have trouble saying the word you want to say:  1)  Describe it! Describe the size, color, shape, function, composition (what it's made of), and/or location to be able to have the word come sooner, or to have your listener help you out  2) "talk around the word" (say it a totally different way) -get your point out using different words than the one/ones you can't think of  3) Use a synonym - think of another word that means the exact same thing  4) DRAW! You can draw some things you want to say in order to give your listener a hint about what you're talking about  5)  Gesture- make motions to help your listener understand what you are trying to communicate  6) Write down the word, if you can - or the first letter or letters, to help you say the word or to give your listener a hint about what you're trying to say

## 2021-02-12 ENCOUNTER — Other Ambulatory Visit: Payer: Self-pay

## 2021-02-12 ENCOUNTER — Ambulatory Visit: Payer: 59 | Admitting: Physical Therapy

## 2021-02-12 ENCOUNTER — Ambulatory Visit: Payer: 59 | Admitting: Occupational Therapy

## 2021-02-12 ENCOUNTER — Encounter: Payer: Self-pay | Admitting: Physical Therapy

## 2021-02-12 DIAGNOSIS — R42 Dizziness and giddiness: Secondary | ICD-10-CM

## 2021-02-12 DIAGNOSIS — R41841 Cognitive communication deficit: Secondary | ICD-10-CM | POA: Diagnosis not present

## 2021-02-12 DIAGNOSIS — R41840 Attention and concentration deficit: Secondary | ICD-10-CM

## 2021-02-12 DIAGNOSIS — R2681 Unsteadiness on feet: Secondary | ICD-10-CM

## 2021-02-12 DIAGNOSIS — M6281 Muscle weakness (generalized): Secondary | ICD-10-CM

## 2021-02-12 DIAGNOSIS — M25611 Stiffness of right shoulder, not elsewhere classified: Secondary | ICD-10-CM

## 2021-02-12 DIAGNOSIS — M25511 Pain in right shoulder: Secondary | ICD-10-CM | POA: Diagnosis not present

## 2021-02-12 DIAGNOSIS — R278 Other lack of coordination: Secondary | ICD-10-CM | POA: Diagnosis not present

## 2021-02-12 DIAGNOSIS — R4701 Aphasia: Secondary | ICD-10-CM | POA: Diagnosis not present

## 2021-02-12 DIAGNOSIS — R2689 Other abnormalities of gait and mobility: Secondary | ICD-10-CM | POA: Diagnosis not present

## 2021-02-12 NOTE — Therapy (Signed)
[facility] [address] [place], [place], [address] Phone: [phone]   Fax:  [phone]  Occupational Therapy Evaluation  Patient Details  Name: Kimberly Monroe MRN: [id number] Date of Birth: [birth year] Referring Provider (OT): Dr. Malachy Chamber   Encounter Date: 02/12/2021   OT End of Session - 02/12/21 1229    Visit Number 1    Number of Visits 17    Date for OT Re-Evaluation 04/14/21    Authorization Type Currently UHC but may change - 23 visits for OT per year    Authorization - Number of Visits 23    OT Start Time 1015    OT Stop Time 1100    OT Time Calculation (min) 45 min    Activity Tolerance Patient tolerated treatment well    Behavior During Therapy [facility] for tasks assessed/performed           Past Medical History:  Diagnosis Date  . Allergy    seasonal  . Anemia   . Anxiety   . 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 Basal cell cancer   . Cognitive changes   . Depression   . Goals of care, counseling/discussion 11/02/2019  . History of blood clotting disorder   . Hx of cerebral artery stenosis   . Hypertension   . Melanoma ([place]) 2009   insitu  . Menorrhagia 02/10/2013  . Migraine    history of/none in years  . Ovarian cancer ([place]) 1995   left ovary  . Pulmonary embolism ([place])   . Squamous cell carcinoma   . Stroke [facility])     Past Surgical History:  Procedure Laterality Date  . ABDOMINAL HYSTERECTOMY  03/06/2013  . BREAST DUCTAL SYSTEM EXCISION  2009   L intraductal papilloma  . CEREBRAL ANGIOGRAM  2020  . [place]   twins, singleton  . IR ANGIO INTRA EXTRACRAN SEL COM CAROTID INNOMINATE BILAT MOD SED  11/05/2018  . IR ANGIO VERTEBRAL SEL SUBCLAVIAN INNOMINATE UNI L MOD SED  11/05/2018  . IR ANGIO VERTEBRAL SEL VERTEBRAL UNI R MOD SED  11/05/2018  . IR US GUIDE VASC ACCESS RIGHT  11/05/2018  . laparoscopy with ovarian cystectomy  1994   ovarian torsion  .  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 MOHS SURGERY  2009   Dr Link Snuffer    There were no vitals filed for this visit.   Subjective Assessment - 02/12/21 1019    Pertinent History diffuse astrocytoma (malignant neoplasm) diagnosed 09/2019. PMHx of basal and squamous cell carcinoma, melanoma, intraductal papilloma of breast, ovarian cancer, PE (on Xarelto),? CVA, Fe-deficient anemia, HTN, HLD, hx cerebral artery stenosis, depression/anxiety, and migraines    Limitations Fall risk, no driving    Patient Stated Goals Decrease family burden of care and increase compensatory strategies for ADLS    Currently in Pain? Yes    Pain Location Head    Pain Descriptors / Indicators Dull    Pain Type Acute pain    Pain Onset 1 to 4 weeks ago    Pain Frequency Constant    Aggravating Factors  too much light or noise    Pain Relieving Factors darker quiet room             [facility] OT Assessment - 02/12/21 0001      Assessment   Medical Diagnosis Diffuse astrocytoma    Referring Provider (OT) Dr. Malachy Chamber    Onset Date/Surgical Date  10/04/19    Hand Dominance Right    Prior Therapy no      Precautions   Precautions Fall    Precaution Comments multiple falls, no driving      Restrictions   Weight Bearing Restrictions No      Balance Screen   Has the patient fallen in the past 6 months Yes    How many times? --   see P.T. evaluation     Home  Environment   Bathroom Shower/Tub Walk-in Shower   no grab bar or shower seat - has assist   Additional Comments Pt lives w/ husband and 1 of her daughters. Pt lives in 3 story home but stays on main level. DME: [facility]    Lives With Spouse      Prior Function   Level of Independence Independent   2 years ago   Vocation Retired      ADL   Eating/Feeding Modified independent   eats out of a bowl and uses spoon (modified)   Grooming Modified independent   electric toothbrush, Lt hand/UE for overhead brushing/washing hair d/t decreased Rt shoulder  motion   Upper Body Bathing Supervision/safety    Lower Body Bathing Supervision/safety   LH sponge/brush   Upper Body Dressing Increased time    Lower Body Dressing Modified independent   slip on shoes, sits to put pants on   Toilet Transfer Modified independent    Toileting - Clothing Manipulation Modified independent    Tub/Shower Transfer Supervision/safety    ADL comments increased time, set up for all ADLS, flip top toothpaste, modifications      IADL   Shopping --   Daughter does online (pt assist w/ list)   Light Housekeeping --   Daughter and husband does most of it. Pt folds clothes   Meal Prep --   Husband cooks meat, pt assist as able (pt reports dropping things and has burned hand)   Nurse, children's Relies on family or friends for transportation;Travels on public transportation when accompanied by another    Medication Management Takes responsibility if medication is prepared in advance in seperate dosage   with modifications/checklist for reminder   Financial Management Requires assistance      Mobility   Mobility Status Comments walks w/ cane, limited by fluctuating dizziness      Written Expression   Dominant Hand Right    Handwriting 90% legible      Vision - History   Baseline Vision Wears glasses only for reading    Additional Comments fatigues quickly when trying to read, difficulty focusing, can't use computer      Activity Tolerance   Activity Tolerance Comments fatigues quickly      Cognition   Cognition Comments Pt slower to process information, overwhelmed and unable to process information w/ over stimulation, sensitive to noise and light, pt does appear aware of deficits and states compensations/modifications used for meal planning, etc.      Observation/Other Assessments   Observations Pt able to state deficits but pt reports difficulty w/ coming up w/ compensations for deficits. Pt also with mild tremors Rt hand.      Sensation   Additional  Comments reports dropping items more from Rt hand, diminshed sensation Rt hand.      Coordination   9 Hole Peg Test Right;Left    Right 9 Hole Peg Test 69 sec   ? apraxia   Left 9 Hole Peg Test 34 sec  Praxis   Praxis --   ? motor planning deficits     Edema   Edema none noted      AROM   Overall AROM Comments LUE AROM WFL's but shoulder pain with IR. RT shoulder flex to approx 95*, ER 50%, IR 75%, elbow distally WNL's      Strength   Overall Strength Comments right shoulder flex/abd 3/5, IR/ER 3-/5; left shoulder 4+/5-5/5; RLE cogwheel 3+/5, LLE 5/5      Hand Function   Right Hand Grip (lbs) 26.4 lbs    Left Hand Grip (lbs) 47.1 lbs                             OT Short Term Goals - 02/12/21 1354      OT SHORT TERM GOAL #1   Title Independent with Rt shoulder HEP    Time 4    Period Weeks    Status New      OT SHORT TERM GOAL #2   Title Independent with coordination and putty HEP for Rt hand    Time 4    Period Weeks    Status New      OT SHORT TERM GOAL #3   Title Pt to verbalize understanding with external cues/compensations and potential A/E to increase safety/independence with ADLS due to cognitive deficits    Time 4    Period Weeks    Status New      OT SHORT TERM GOAL #4   Title Pt to verbalize understanding with safety considerations d/t decreased sensation Rt hand and to report overall less drops Rt hand    Time 4    Period Weeks    Status New             OT Long Term Goals - 02/12/21 1358      OT LONG TERM GOAL #1   Title Pt/family to verbalize understanding with acquiring grab bars for shower    Time 8    Period Weeks    Status New      OT LONG TERM GOAL #2   Title Pt to consistently make own cold meals/sandwiches/microwaveable items I'ly and assist with prep work of dinner    Time 8    Period Weeks    Status New      OT LONG TERM GOAL #3   Title Pt to improve coordination Rt hand as evidenced by reducing speed on  9 hole peg test to 55 sec or less    Baseline 69 sec    Time 8    Period Weeks    Status New      OT LONG TERM GOAL #4   Title Pt to improve grip strength Rt hand to 30 lbs or greater to assist with opening items    Baseline 26 lbs    Time 8    Period Weeks    Status New      OT LONG TERM GOAL #5   Title Pt to use RUE to assist in brushing and washing hair    Baseline currenlty w/ LUE d/t limited Rt shoulder ROM and pain    Time 8    Period Weeks    Status New                 Plan - 02/12/21 1346    Clinical Impression Statement Pt is a 60 y.o. female who presents to [facility] for  evaluation s/p diffuse astrocytoma and resultant RUE weakness. Pt had resection in 2021 and just finished chemo and radiation per pt report. PMHx of basal and squamous cell carcinoma, melanoma, intraductal papilloma of breast, ovarian cancer, PE (on Xarelto),? CVA, Fe-deficient anemia, HTN, HLD, hx cerebral artery stenosis, depression/anxiety, and migraines. Pt presents today with deficits in RUE at shoulder and hand, pain, balance, and cognition. Pt would benefit from O.T. to address these deficits, increase safety with ADLS, and decrease family burden of care.    OT Occupational Profile and History Detailed Assessment- Review of Records and additional review of physical, cognitive, psychosocial history related to current functional performance    Occupational performance deficits (Please refer to evaluation for details): ADL's;IADL's;Social Participation    Body Structure / Function / Physical Skills ADL;Strength;Dexterity;Pain;Proprioception;UE functional use;IADL;ROM;Sensation;Coordination;FMC    Cognitive Skills Problem Solve;Perception;Sequencing    Rehab Potential Good    Clinical Decision Making Several treatment options, min-mod task modification necessary    Comorbidities Affecting Occupational Performance: Presence of comorbidities impacting occupational performance    Comorbidities impacting  occupational performance description: malignant brain cancer    OT Frequency 2x / week    OT Duration 8 weeks   plus eval (may reduce duration prn based on pts progress)   OT Treatment/Interventions Self-care/ADL training;DME and/or AE instruction;Moist Heat;Therapeutic activities;Coping strategies training;Cognitive remediation/compensation;Therapeutic exercise;Neuromuscular education;Passive range of motion;Functional Mobility Training;Visual/perceptual remediation/compensation;Patient/family education;Energy conservation;Manual Therapy    Plan simple HEP for Rt shoulder (try cane supine for flexion, ER/IR) and putty HEP    Consulted and Agree with Plan of Care Patient           Patient will benefit from skilled therapeutic intervention in order to improve the following deficits and impairments:   Body Structure / Function / Physical Skills: ADL,Strength,Dexterity,Pain,Proprioception,UE functional use,IADL,ROM,Sensation,Coordination,FMC Cognitive Skills: Problem Solve,Perception,Sequencing     Visit Diagnosis: Stiffness of right shoulder, not elsewhere classified  Right shoulder pain, unspecified chronicity  Unsteadiness on feet  Muscle weakness (generalized)  Other lack of coordination  Attention and concentration deficit    Problem List Patient Active Problem List   Diagnosis Date Noted  . Hyperlipidemia 01/11/2020  . 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 Goals of care, counseling/discussion 11/02/2019  . Abnormal brain MRI 12/07/2018  . Confusion 12/07/2018  . TIA (transient ischemic attack) 11/04/2018  . CVA (cerebral vascular accident) ([place]) 03/29/2018  . Essential hypertension 06/20/2016  . Suicidal ideation 09/20/2013  . MDD (major depressive disorder) 09/20/2013  . Menorrhagia 02/10/2013  . Pulmonary embolism ([place]) 09/09/2012  . History of pulmonary embolism 09/06/2012  . Acute respiratory failure with hypoxia ([place]) 02/04/2012  . Bilateral  pulmonary embolism ([place]) 02/04/2012  . Hypokalemia 02/04/2012  . Hyponatremia 02/04/2012  . Elevation of cardiac enzymes 02/04/2012  . Family History of hypercoagulable state 02/04/2012  . History of squamous cell carcinoma of skin 12/17/2011  . History  of basal cell carcinoma 12/17/2011  . Depression 12/17/2011  . Iron deficiency anemia - severe 12/17/2011  . Melanoma [facility])     Carey Bullocks, OTR/L 02/12/2021, 2:12 PM  [facility] [address] [place], [place], [address] Phone: [phone]   Fax:  [phone]  Name: TIONA RUANE MRN: [id number] Date of Birth: [birth year]

## 2021-02-12 NOTE — Therapy (Signed)
[facility] [address] [place], [place], [address] Phone: [phone]   Fax:  [phone]  Physical Therapy Treatment/Re-Cert  Patient Details  Name: Kimberly Monroe MRN: [id number] Date of Birth: 1961/01/17 Referring Provider (PT): Malachy Chamber MD   Encounter Date: 02/12/2021   PT End of Session - 02/12/21 1120    Visit Number 8    Number of Visits 24    Date for PT Re-Evaluation 05/13/21    Authorization Type UHC VL- 23 PT, with 19 remaining/ 33 ST - 20 remaining    Authorization - Visit Number 1    Authorization - Number of Visits 18   authorized visits pt has left as of 02/12/21   PT Start Time 0932    PT Stop Time 1014    PT Time Calculation (min) 42 min    Activity Tolerance --   limited by nausea/dizziness   Behavior During Therapy [facility] for tasks assessed/performed           Past Medical History:  Diagnosis Date  . Allergy    seasonal  . Anemia   . Anxiety   . 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 Basal cell cancer   . Cognitive changes   . Depression   . Goals of care, counseling/discussion 11/02/2019  . History of blood clotting disorder   . Hx of cerebral artery stenosis   . Hypertension   . Melanoma ([place]) 2009   insitu  . Menorrhagia 02/10/2013  . Migraine    history of/none in years  . Ovarian cancer ([place]) 1995   left ovary  . Pulmonary embolism ([place])   . Squamous cell carcinoma   . Stroke [facility])     Past Surgical History:  Procedure Laterality Date  . ABDOMINAL HYSTERECTOMY  03/06/2013  . BREAST DUCTAL SYSTEM EXCISION  2009   L intraductal papilloma  . CEREBRAL ANGIOGRAM  2020  . [place]   twins, singleton  . IR ANGIO INTRA EXTRACRAN SEL COM CAROTID INNOMINATE BILAT MOD SED  11/05/2018  . IR ANGIO VERTEBRAL SEL SUBCLAVIAN INNOMINATE UNI L MOD SED  11/05/2018  . IR ANGIO VERTEBRAL SEL VERTEBRAL UNI R MOD SED  11/05/2018  . IR US GUIDE VASC ACCESS RIGHT   11/05/2018  . laparoscopy with ovarian cystectomy  1994   ovarian torsion  . 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 MOHS SURGERY  2009   Dr Link Snuffer    There were no vitals filed for this visit.   Subjective Assessment - 02/12/21 0935    Subjective Was going up the stairs and tripped forwards a little bit, fell on her knees. Didn't have the chance to order the quad tip for her walker. Has been having a dull headache for the past 2 weeks.    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              [facility] PT Assessment - 02/12/21 1136      Assessment   Medical Diagnosis Diffuse astrocytoma    Referring Provider (PT) Malachy Chamber MD      Precautions   Precautions Fall    Precaution Comments multiple falls,  no driving      Prior Function   Level of Independence Independent   2 years ago                         Vestibular Treatment/Exercise - 02/12/21 0948      Vestibular Treatment/Exercise   Vestibular Treatment Provided Gaze    Gaze Exercises X1 Viewing Horizontal      X1 Viewing Horizontal   Foot Position feet flat on the floor, seated in chair with back support, performed in room with lights dimmed    Reps 2    Comments 2-3 slow reps, reports feeling incr nausea afterwards              Balance Exercises - 02/12/21 0001      Balance Exercises: Standing   Standing Eyes Opened Wide (BOA);Solid surface;Limitations;30 secs;3 reps    Standing Eyes Opened Limitations standing in corner, using wall/chair as needed for balance    Partial Tandem Stance Eyes open;Intermittent upper extremity support;2 reps;20 secs;Limitations    Partial Tandem Stance Limitations wide BOS for balance,  incr difficulty with RLE posteriorly, able to perform with LLE posteriorly with no UE support, x2 reps each side             PT Education - 02/12/21 1011    Education Details pt asking about meaning of vestib findings from last session and how they relate to her balance - PT providing education. discussed using a RW for safey with gait at home/in the community, pt reporting that she might not have room in her house to use a RW. pt still needing to order the quad tip for her SPC.    Person(s) Educated Patient    Methods Explanation    Comprehension Verbalized understanding            PT Short Term Goals - 01/24/21 1109      PT SHORT TERM GOAL #1   Title Ind with initial HEP    Baseline pt has not been performing HEP consistently due to cognitive deficits - daughter not present with pt to review    Status Not Met    Target Date 01/15/21      PT SHORT TERM GOAL #2   Title Patient compliant with use of appropriate AD at home and in the community to prevent further falls.    Baseline uses SPC on 01/24/21    Status Achieved      PT SHORT TERM GOAL #3   Title Improved 5x sit to stand to 30 seconds    Baseline 62 seconds on 01/24/21    Status Not Met          Revised STGs for re-cert   PT Short Term Goals - 02/12/21 1138      PT SHORT TERM GOAL #1   Title Pt will be independent with HEP with assist/cues from daughter in order to improve compliance at home.    Baseline pt has not been performing HEP consistently due to cognitive deficits - daughter not present with pt to review. ALL STGS DUE 03/12/21.    Time 4    Period Weeks    Status New    Target Date 03/12/21      PT SHORT TERM GOAL #2   Title Pt will undergo trial of RW vs. rollator to determine appropriate AD in the home and in the community in order to decr fall risk and improve safety.  Baseline uses SPC on 01/24/21    Time 4    Period Weeks    Status New      PT SHORT TERM GOAL #3   Title Pt will improve gait  speed to at least 1.0 ft/sec with appropriate AD in order to demo decr fall risk.    Baseline .52 ft/sec with SPC on 01/30/21    Time 4    Period Weeks    Status New      PT SHORT TERM GOAL #4   Title Pt will demo compensatory saccades with supervision with sit <> stands and gait/standing with head motions in order to demo improved functional mobility.    Time 4    Period Weeks    Status New      PT SHORT TERM GOAL #5   Title Pt will undergo TUG with appropriate AD with LTG written as appropriate.    Time 4    Period Weeks    Status New              PT Long Term Goals - 12/18/20 1314      PT LONG TERM GOAL #1   Title Ind with advanced HEP to maintain gains in strength and ROM    Time 8    Period Weeks    Status New    Target Date 02/12/21      PT LONG TERM GOAL #2   Title Improved nomal TUG(on carpet if tolerated) with AD to <= 10 sec to decrease fall risk    Time 8    Period Weeks    Status New      PT LONG TERM GOAL #3   Title Improved 5x sit to stand to < 20 sec to decrease likelihood of falls    Time 8    Period Weeks    Status New      PT LONG TERM GOAL #4   Title Improved BERG score to 40/56 to decrease likelihood of falls.    Time 8    Period Weeks    Status New      PT LONG TERM GOAL #5   Title Improve right UE flexion to >= 120 deg to improve function of Rt UE    Time 8    Period Weeks    Status New          Revised/ongoing LTGs for re-cert:      PT Long Term Goals - 02/12/21 1142      PT LONG TERM GOAL #1   Title TUG goal to be written as appropriate to decr fall risk with appropriate AD. ALL LTGS DUE 04/09/21.    Time 8    Period Weeks    Status New    Target Date 04/09/21      PT LONG TERM GOAL #2   Title Pt will ambulate with appropriate AD 60' with supervision around clinic gym in order to demo improved household mobility.    Baseline with SPC, needs min guard at times for safety, pt unable to tolerate clinic environment    Time 8     Period Weeks    Status New      PT LONG TERM GOAL #3   Title Pt will decr 5x sit <> stand to 40 seconds or less with BUE support to demo decr fall risk and improved functional BLE strength.    Baseline 62 seconds on 01/24/21    Time 8    Period  Weeks    Status New      PT LONG TERM GOAL #4   Title Pt will rate positional sensitivities  on the MSQ to a 1 or less in order to demo improved motion sensitivity.    Time 8    Period Weeks    Status New      PT LONG TERM GOAL #5   Title Pt will improve gait speed to at least 1.0 ft/sec with appropriate AD in order to demo decr fall risk.    Baseline .82 ft/sec with SPC    Time 8    Period Weeks    Status New              Plan - 02/12/21 1127    Clinical Impression Statement Attempted slow seated VOR x1 today in horizontal direction, but pt unable to tolerate performing  2-3 reps. Pt had incr nausea and needed a prolonged seated rest break, pt able to tolerate standing balance well with decr UE support. Pt has been transferred to neuro OPPT from [place] to be performed today due to recent vestibular assessment to include in POC. Pt has central vestibular findings and significant visual/motion sensitvity. Unable to assess LTGs today due to incorporating vestibular training this session and pt needing seated rest breaks throughout. Gait speed was assessed on 01/30/21, with pt ambulating .75 ft/sec with SPC, indicating pt is at an incr fall risk and is a household ambulator. Discussed having pt's daughter come to future sessions for improved carryover with education provided in session and for HEP. STGs/LTGs revised as appropriate.    Personal Factors and Comorbidities Comorbidity 3+    Comorbidities brain cancer, cva, headaches, stenosis, dizziness    Examination-Activity Limitations Locomotion Level;Stairs;Transfers    Examination-Participation Restrictions Community Activity;Driving    Rehab Potential Good    PT Frequency 2x  / week   1-2x a week   PT Duration 8 weeks    PT Treatment/Interventions ADLs/Self Care Home Management;Neuromuscular re-education;Balance training;Therapeutic exercise;Therapeutic activities;Stair training;Gait training;Patient/family education;Manual techniques;DME Instruction;Visual/perceptual remediation/compensation;Vestibular;Passive range of motion    PT Next Visit Plan Treat in private room with lights dimmed. compensatory saccades with head turns. has pt picking up the quad tip for her [facility] yet? may want to see about RW/rollator for safety with gait. Balance with decreased UE support in corner, head turns, weight shifting activities with BUE support.    PT Home Exercise Plan [id number]    Consulted and Agree with Plan of Care Patient           Patient will benefit from skilled therapeutic intervention in order to improve the following deficits and impairments:  Abnormal gait,Decreased range of motion,Impaired UE functional use,Decreased balance,Decreased strength,Postural dysfunction,Impaired flexibility,Dizziness,Decreased activity tolerance,Decreased coordination,Impaired sensation,Difficulty walking,Decreased knowledge of use of DME  Visit Diagnosis: Unsteadiness on feet  Muscle weakness (generalized)  Other abnormalities of gait and mobility  Dizziness and giddiness     Problem List Patient Active Problem List   Diagnosis Date Noted  . Hyperlipidemia 01/11/2020  . 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 Goals of care, counseling/discussion 11/02/2019  . Abnormal brain MRI 12/07/2018  . Confusion 12/07/2018  . TIA (transient ischemic attack) 11/04/2018  . CVA (cerebral vascular accident) ([place]) 03/29/2018  . Essential hypertension 06/20/2016  . Suicidal ideation 09/20/2013  . MDD (major depressive disorder) 09/20/2013  . Menorrhagia 02/10/2013  . Pulmonary embolism ([place]) 09/09/2012  . History of pulmonary embolism 09/06/2012  . Acute respiratory failure with  hypoxia ([place]) 02/04/2012  . Bilateral pulmonary embolism ([place]) 02/04/2012  . Hypokalemia 02/04/2012  . Hyponatremia 02/04/2012  . Elevation of cardiac enzymes 02/04/2012  . Family History of hypercoagulable state 02/04/2012  . History of squamous cell carcinoma of skin 12/17/2011  . History  of basal cell carcinoma 12/17/2011  . Depression 12/17/2011  . Iron deficiency anemia - severe 12/17/2011  . Melanoma ([place])     Arliss Journey, PT, DPT  02/12/2021, 11:37 AM  [facility] [address] [place], [place], [address] Phone: [phone]   Fax:  [phone]  Name: Kimberly Monroe MRN: [id number] Date of Birth: 08-20-1961

## 2021-02-14 ENCOUNTER — Other Ambulatory Visit: Payer: Self-pay

## 2021-02-14 ENCOUNTER — Ambulatory Visit: Payer: 59

## 2021-02-14 DIAGNOSIS — M25511 Pain in right shoulder: Secondary | ICD-10-CM | POA: Diagnosis not present

## 2021-02-14 DIAGNOSIS — R2689 Other abnormalities of gait and mobility: Secondary | ICD-10-CM | POA: Diagnosis not present

## 2021-02-14 DIAGNOSIS — R41841 Cognitive communication deficit: Secondary | ICD-10-CM | POA: Diagnosis not present

## 2021-02-14 DIAGNOSIS — R2681 Unsteadiness on feet: Secondary | ICD-10-CM | POA: Diagnosis not present

## 2021-02-14 DIAGNOSIS — R42 Dizziness and giddiness: Secondary | ICD-10-CM | POA: Diagnosis not present

## 2021-02-14 DIAGNOSIS — R4701 Aphasia: Secondary | ICD-10-CM | POA: Diagnosis not present

## 2021-02-14 DIAGNOSIS — R41840 Attention and concentration deficit: Secondary | ICD-10-CM | POA: Diagnosis not present

## 2021-02-14 DIAGNOSIS — M6281 Muscle weakness (generalized): Secondary | ICD-10-CM | POA: Diagnosis not present

## 2021-02-14 DIAGNOSIS — M25611 Stiffness of right shoulder, not elsewhere classified: Secondary | ICD-10-CM | POA: Diagnosis not present

## 2021-02-14 DIAGNOSIS — R278 Other lack of coordination: Secondary | ICD-10-CM | POA: Diagnosis not present

## 2021-02-14 NOTE — Therapy (Signed)
[facility] [address] [place], [place], [address] Phone: [phone]   Fax:  [phone]  Speech Language Pathology Treatment  Patient Details  Name: Kimberly Monroe MRN: [id number] Date of Birth: [birth year] Referring Provider (SLP): Malachy Chamber, MD   Encounter Date: 02/14/2021   End of Session - 02/14/21 1140    Visit Number 3    Number of Visits 17    Date for SLP Re-Evaluation 04/24/21    Authorization Type UHC    Authorization Time Period 68 ST    SLP Start Time 0933    SLP Stop Time  3500    SLP Time Calculation (min) 42 min    Activity Tolerance Other (comment);Patient tolerated treatment well   external and internal distractions          Past Medical History:  Diagnosis Date  . Allergy    seasonal  . Anemia   . Anxiety   . 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 Basal cell cancer   . Cognitive changes   . Depression   . Goals of care, counseling/discussion 11/02/2019  . History of blood clotting disorder   . Hx of cerebral artery stenosis   . Hypertension   . Melanoma ([place]) 2009   insitu  . Menorrhagia 02/10/2013  . Migraine    history of/none in years  . Ovarian cancer ([place]) 1995   left ovary  . Pulmonary embolism ([place])   . Squamous cell carcinoma   . Stroke [facility])     Past Surgical History:  Procedure Laterality Date  . ABDOMINAL HYSTERECTOMY  03/06/2013  . BREAST DUCTAL SYSTEM EXCISION  2009   L intraductal papilloma  . CEREBRAL ANGIOGRAM  2020  . [place]   twins, singleton  . IR ANGIO INTRA EXTRACRAN SEL COM CAROTID INNOMINATE BILAT MOD SED  11/05/2018  . IR ANGIO VERTEBRAL SEL SUBCLAVIAN INNOMINATE UNI L MOD SED  11/05/2018  . IR ANGIO VERTEBRAL SEL VERTEBRAL UNI R MOD SED  11/05/2018  . IR US GUIDE VASC ACCESS RIGHT  11/05/2018  . laparoscopy with ovarian cystectomy  1994   ovarian torsion  . 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 MOHS SURGERY  2009    Dr Link Snuffer    There were no vitals filed for this visit.   Subjective Assessment - 02/14/21 0856    Subjective "You don't remember me, but we used to homeschool together." (halting speech)                 ADULT SLP TREATMENT - 02/14/21 0907      General Information   Behavior/Cognition Alert;Cooperative;Pleasant mood;Distractible;Requires cueing      Treatment Provided   Treatment provided Cognitive-Linquistic      Cognitive-Linquistic Treatment   Treatment focused on Patient/family/caregiver education;Cognition    Skilled Treatment "I can't go back to work - if I can't turn on the dishwasher correctly there's no way I can go back to work." - anticipatory awareness demonstrated. SLP observed pt's decr'd sustained and selective (internal distraction) attention today in conversation. SLP brought this to light with pt and she agreed with SLP. SLP assessed Kimberly Monroe continuing with Cognitive Linguistic Quick Test (CLQT); pt scored a subtest score of 3/10 on Story Retelling, and 4/10 on generative naming. SLP believes pt's lower scores on these assessments mainly due to decr'd attention skills. Lastly pt scored 3/6 on Design Memory, due to timing out of each of the  three stimuli (pt provided her second response after the 10-second time limit). Pt provided practical examples of limited/deficient organization at home and SLP believes this may also be due to reduced memory encoding and possibly attention. Ex: pt's daughter assisted pt setting up some files in her [place] but when Kimberly Monroe went back to access the files had difficulty doing so, stating she could not find them. She called daughter for assistance and daughter told pt that was the way she wanted them set up but pt did not write any notes for recall.      Assessment / Recommendations / Plan   Plan Continue with current plan of care      Progression Toward Goals   Progression toward goals Progressing toward goals             SLP Education - 02/14/21 1140    Education Details need to simplify things and make notes, alarms, reminders    Person(s) Educated Patient    Methods Explanation    Comprehension Verbalized understanding            SLP Short Term Goals - 02/14/21 1142      SLP SHORT TERM GOAL #1   Title Pt will use 2 memory and attention compensations to aid successful completion of household tasks and other daily activities with occasional min A over 3 sessions.    Time 3    Period Weeks    Status On-going      SLP SHORT TERM GOAL #2   Title Pt will complete 2-3 step sequences related to household tasks for 4/5 opportunities with occassional min A over 3 sessions    Time 3    Period Weeks    Status On-going      SLP SHORT TERM GOAL #3   Title Pt will use word finding compensations in simple 10 minute conversations with rare min A over 2 sessions    Time 3    Period Weeks    Status On-going      SLP SHORT TERM GOAL #4   Title Pt will follow simple 4-5 step recipes for cooking at home with use of compensations given min A from family for 2/2 opportunities    Time 3    Period Weeks    Status On-going      SLP SHORT TERM GOAL #5   Title Pt will comprehend simple to mod complex medical information related to her care with use of clarifying questions and confirming statements over 2 sessions    Time 3    Period Weeks    Status On-going            SLP Long Term Goals - 02/14/21 1143      SLP LONG TERM GOAL #1   Title Pt will use 4 memory and attention compensations for successful completion of household tasks and other daily activities with rare min A from family over 2 sessions    Time 7    Period Weeks    Status On-going      SLP LONG TERM GOAL #2   Title Pt will successfully complete 2 household tasks of choice with use of compensations and occasional min A over 2 sessions    Time 7    Period Weeks    Status On-going      SLP LONG TERM GOAL #3   Title Pt will use word  finding compensations in 15 min simple to mod complex conversations with rare min A  over 2 sessions    Time 7    Period Weeks    Status On-going      SLP LONG TERM GOAL #4   Title Pt will comprehend mod complex to complex medical information related to her care with use of clarifying questions and confirming statements over 2 sessions    Time 7    Period Weeks    Status On-going      SLP LONG TERM GOAL #5   Title Pt will report reduced frustration related to cognitive communication skills by last ST session    Time 7    Period Weeks    Status On-going            Plan - 02/14/21 1141    Clinical Impression Statement Kimberly Monroe presents for ST intervention to address changes in cognitive linguistic skills due to malignant neoplasm of brain. Cognitive Linguistic Quick Test was continued today but could not be completed today - will complete next session. Pt would benefit from additional assistance to complete complex or high level tasks due to processing deficits. Given significant impact on her functional abilities, SLP recommends skilled ST intervention to address cognitive communication to reduce patient frustration, decrease caregiver burden, and optimize QOL.    Speech Therapy Frequency 2x / week    Duration 8 weeks   or 17 total visits   Treatment/Interventions Compensatory strategies;Patient/family education;Functional tasks;Cueing hierarchy;Environmental controls;Cognitive reorganization;Multimodal communcation approach;SLP instruction and feedback;Internal/external aids;Compensatory techniques;Language facilitation    Potential to Achieve Goals Fair    Potential Considerations Medical prognosis;Severity of impairments    SLP Home Exercise Plan provided    Consulted and Agree with Plan of Care Patient           Patient will benefit from skilled therapeutic intervention in order to improve the following deficits and impairments:   Cognitive communication  deficit  Aphasia    Problem List Patient Active Problem List   Diagnosis Date Noted  . Hyperlipidemia 01/11/2020  . 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 Goals of care, counseling/discussion 11/02/2019  . Abnormal brain MRI 12/07/2018  . Confusion 12/07/2018  . TIA (transient ischemic attack) 11/04/2018  . CVA (cerebral vascular accident) ([place]) 03/29/2018  . Essential hypertension 06/20/2016  . Suicidal ideation 09/20/2013  . MDD (major depressive disorder) 09/20/2013  . Menorrhagia 02/10/2013  . Pulmonary embolism ([place]) 09/09/2012  . History of pulmonary embolism 09/06/2012  . Acute respiratory failure with hypoxia ([place]) 02/04/2012  . Bilateral pulmonary embolism ([place]) 02/04/2012  . Hypokalemia 02/04/2012  . Hyponatremia 02/04/2012  . Elevation of cardiac enzymes 02/04/2012  . Family History of hypercoagulable state 02/04/2012  . History of squamous cell carcinoma of skin 12/17/2011  . History  of basal cell carcinoma 12/17/2011  . Depression 12/17/2011  . Iron deficiency anemia - severe 12/17/2011  . Melanoma ([place])     [place] ,[place], [place]  02/14/2021, 11:45 AM  [facility] [address] [place], [place], [address] Phone: [phone]   Fax:  [phone]   Name: Kimberly Monroe MRN: [id number] Date of Birth: September 15, 1961

## 2021-02-16 ENCOUNTER — Other Ambulatory Visit: Payer: Self-pay | Admitting: Hematology & Oncology

## 2021-02-16 DIAGNOSIS — Z86711 Personal history of pulmonary embolism: Secondary | ICD-10-CM

## 2021-02-16 DIAGNOSIS — I639 Cerebral infarction, unspecified: Secondary | ICD-10-CM

## 2021-02-19 ENCOUNTER — Ambulatory Visit: Payer: 59 | Admitting: Occupational Therapy

## 2021-02-19 ENCOUNTER — Ambulatory Visit: Payer: 59 | Admitting: Physical Therapy

## 2021-02-19 ENCOUNTER — Ambulatory Visit: Payer: 59

## 2021-02-26 ENCOUNTER — Other Ambulatory Visit: Payer: Self-pay

## 2021-02-26 ENCOUNTER — Ambulatory Visit: Payer: 59

## 2021-02-26 ENCOUNTER — Ambulatory Visit: Payer: 59 | Admitting: Physical Therapy

## 2021-02-26 ENCOUNTER — Ambulatory Visit: Payer: 59 | Admitting: Occupational Therapy

## 2021-02-26 DIAGNOSIS — M25611 Stiffness of right shoulder, not elsewhere classified: Secondary | ICD-10-CM

## 2021-02-26 DIAGNOSIS — R41841 Cognitive communication deficit: Secondary | ICD-10-CM

## 2021-02-26 DIAGNOSIS — R2689 Other abnormalities of gait and mobility: Secondary | ICD-10-CM

## 2021-02-26 DIAGNOSIS — M25511 Pain in right shoulder: Secondary | ICD-10-CM

## 2021-02-26 DIAGNOSIS — R4701 Aphasia: Secondary | ICD-10-CM

## 2021-02-26 DIAGNOSIS — R2681 Unsteadiness on feet: Secondary | ICD-10-CM | POA: Diagnosis not present

## 2021-02-26 DIAGNOSIS — R42 Dizziness and giddiness: Secondary | ICD-10-CM | POA: Diagnosis not present

## 2021-02-26 DIAGNOSIS — R41840 Attention and concentration deficit: Secondary | ICD-10-CM | POA: Diagnosis not present

## 2021-02-26 DIAGNOSIS — M6281 Muscle weakness (generalized): Secondary | ICD-10-CM | POA: Diagnosis not present

## 2021-02-26 DIAGNOSIS — R278 Other lack of coordination: Secondary | ICD-10-CM | POA: Diagnosis not present

## 2021-02-26 NOTE — Therapy (Signed)
[facility] [address] [place], [place], [address] Phone: [phone]   Fax:  [phone]  Speech Language Pathology Treatment  Patient Details  Name: Kimberly Monroe MRN: [id number] Date of Birth: 02-21-1961 Referring Provider (SLP): Malachy Chamber, MD   Encounter Date: 02/26/2021   End of Session - 02/26/21 0955    Visit Number 4    Number of Visits 17    Date for SLP Re-Evaluation 04/24/21    Authorization Type UHC    Authorization Time Period 22 ST    SLP Start Time 0805    SLP Stop Time  0850    SLP Time Calculation (min) 45 min    Activity Tolerance Patient tolerated treatment well           Past Medical History:  Diagnosis Date  . Allergy    seasonal  . Anemia   . Anxiety   . 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 Basal cell cancer   . Cognitive changes   . Depression   . Goals of care, counseling/discussion 11/02/2019  . History of blood clotting disorder   . Hx of cerebral artery stenosis   . Hypertension   . Melanoma ([place]) 2009   insitu  . Menorrhagia 02/10/2013  . Migraine    history of/none in years  . Ovarian cancer ([place]) 1995   left ovary  . Pulmonary embolism ([place])   . Squamous cell carcinoma   . Stroke [facility])     Past Surgical History:  Procedure Laterality Date  . ABDOMINAL HYSTERECTOMY  03/06/2013  . BREAST DUCTAL SYSTEM EXCISION  2009   L intraductal papilloma  . CEREBRAL ANGIOGRAM  2020  . [place]   twins, singleton  . IR ANGIO INTRA EXTRACRAN SEL COM CAROTID INNOMINATE BILAT MOD SED  11/05/2018  . IR ANGIO VERTEBRAL SEL SUBCLAVIAN INNOMINATE UNI L MOD SED  11/05/2018  . IR ANGIO VERTEBRAL SEL VERTEBRAL UNI R MOD SED  11/05/2018  . IR US GUIDE VASC ACCESS RIGHT  11/05/2018  . laparoscopy with ovarian cystectomy  1994   ovarian torsion  . 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 MOHS SURGERY  2009   Dr Link Snuffer    There were no vitals filed for  this visit.   Subjective Assessment - 02/26/21 0827    Subjective "I had a video visit with a doctor yesterday and just couldn't find the words."    Currently in Pain? Yes    Pain Score --   "dull pain - I don't give numbers anymore because it didn't go well by me a few times"   Pain Location Head    Pain Orientation Anterior;Left    Pain Descriptors / Indicators Headache;Dull    Pain Type Acute pain                 ADULT SLP TREATMENT - 02/26/21 0931      General Information   Behavior/Cognition Alert;Cooperative;Pleasant mood      Treatment Provided   Treatment provided Cognitive-Linquistic      Cognitive-Linquistic Treatment   Treatment focused on Patient/family/caregiver education    Skilled Treatment Pt complaining of feeling of general malaise and "dull headache" today. Speech was slightly ataxic in ntaure and pt req'd extra time to verbalize her thoughts.Three instances in which pt lost her train of thought, without any compensation techniques attempted except pt verbalizing this to signal listener she still needed conversational floot,  and giving herself extra time. SLP encouraged pt to actively and purposely ask listener for assistance. Yusra expressed frustration with the inability to obtain her visit notes from some medical professionals. SLP suggested she and SLP work on a system to make this feasible for her, but pt politely refused saying essentially that it was unattainable. At the end of the session, SLP asked pt what goal she would like to work towards for communication and she told SLP (demonstrating reduced selective attention-intermal distraction) that she has reduced her circle of communication out of an inability to "keep up with updating everyone." SLP then provided some examples of what was meant by "a goal she would like to work towards" and pt will think on tihs and [place] and SLP will discuss next session. STGs and LTGs may need to be adjusted/added/omitted  after that conversation.      Assessment / Recommendations / Plan   Plan Other (Comment)   talk with pt next session about goal/s she has for easing communication and making it more like PLOF     Progression Toward Goals   Progression toward goals --   see above - goals may need to be adjusted/omitted/added           SLP Education - 02/26/21 0958    Education Details compensation for losing attention during conversation (asking her listener for assistance)    Person(s) Educated Patient    Methods Explanation    Comprehension Verbalized understanding;Need further instruction            SLP Short Term Goals - 02/26/21 0957      SLP SHORT TERM GOAL #1   Title Pt will use 2 memory and attention compensations to aid successful completion of household tasks and other daily activities with occasional min A over 3 sessions.    Time 2    Period Weeks    Status On-going      SLP SHORT TERM GOAL #2   Title Pt will complete 2-3 step sequences related to household tasks for 4/5 opportunities with occassional min A over 3 sessions    Time 2    Period Weeks    Status On-going      SLP SHORT TERM GOAL #3   Title Pt will use word finding compensations in simple 10 minute conversations with rare min A over 2 sessions    Time 2    Period Weeks    Status On-going      SLP SHORT TERM GOAL #4   Title Pt will follow simple 4-5 step recipes for cooking at home with use of compensations given min A from family for 2/2 opportunities    Time 2    Period Weeks    Status On-going      SLP SHORT TERM GOAL #5   Title Pt will comprehend simple to mod complex medical information related to her care with use of clarifying questions and confirming statements over 2 sessions    Time 2    Period Weeks    Status On-going            SLP Long Term Goals - 02/26/21 0957      SLP LONG TERM GOAL #1   Title Pt will use 4 memory and attention compensations for successful completion of household tasks  and other daily activities with rare min A from family over 2 sessions    Time 6    Period Weeks    Status On-going  SLP LONG TERM GOAL #2   Title Pt will successfully complete 2 household tasks of choice with use of compensations and occasional min A over 2 sessions    Time 6    Period Weeks    Status On-going      SLP LONG TERM GOAL #3   Title Pt will use word finding compensations in 15 min simple to mod complex conversations with rare min A over 2 sessions    Time 6    Period Weeks    Status On-going      SLP LONG TERM GOAL #4   Title Pt will comprehend mod complex to complex medical information related to her care with use of clarifying questions and confirming statements over 2 sessions    Time 6    Period Weeks    Status On-going      SLP LONG TERM GOAL #5   Title Pt will report reduced frustration related to cognitive communication skills by last ST session    Time 6    Period Weeks    Status On-going            Plan - 02/26/21 0955    Clinical Impression Statement Joana presents for ST intervention to address changes in cognitive linguistic skills due to thalamic CVA and malignant neoplasm of brain. Cognitive Linguistic Quick Test will be completed next session. Pt would benefit from additional assistance to complete complex or high level tasks due to processing deficits. She will think about a goal she has for communication and she and SLP will disucss next session as well. Given significant impact on her functional abilities, SLP recommends skilled ST intervention to address cognitive communication to reduce patient frustration, decrease caregiver burden, and optimize QOL.    Speech Therapy Frequency 2x / week    Duration 8 weeks   or 17 total visits   Treatment/Interventions Compensatory strategies;Patient/family education;Functional tasks;Cueing hierarchy;Environmental controls;Cognitive reorganization;Multimodal communcation approach;SLP instruction and  feedback;Internal/external aids;Compensatory techniques;Language facilitation    Potential to Achieve Goals Fair    Potential Considerations Medical prognosis;Severity of impairments    SLP Home Exercise Plan provided    Consulted and Agree with Plan of Care Patient           Patient will benefit from skilled therapeutic intervention in order to improve the following deficits and impairments:   Cognitive communication deficit  Aphasia    Problem List Patient Active Problem List   Diagnosis Date Noted  . Hyperlipidemia 01/11/2020  . 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 Goals of care, counseling/discussion 11/02/2019  . Abnormal brain MRI 12/07/2018  . Confusion 12/07/2018  . TIA (transient ischemic attack) 11/04/2018  . CVA (cerebral vascular accident) ([place]) 03/29/2018  . Essential hypertension 06/20/2016  . Suicidal ideation 09/20/2013  . MDD (major depressive disorder) 09/20/2013  . Menorrhagia 02/10/2013  . Pulmonary embolism ([place]) 09/09/2012  . History of pulmonary embolism 09/06/2012  . Acute respiratory failure with hypoxia ([place]) 02/04/2012  . Bilateral pulmonary embolism ([place]) 02/04/2012  . Hypokalemia 02/04/2012  . Hyponatremia 02/04/2012  . Elevation of cardiac enzymes 02/04/2012  . Family History of hypercoagulable state 02/04/2012  . History of squamous cell carcinoma of skin 12/17/2011  . History  of basal cell carcinoma 12/17/2011  . Depression 12/17/2011  . Iron deficiency anemia - severe 12/17/2011  . Melanoma ([place])     [place] ,[place], [place]  02/26/2021, 9:59 AM  [place], [place],  [address] Phone: [phone]   Fax:  [phone]   Name: ANAHLI ARVANITIS MRN: [id number] Date of Birth: Mar 07, 1961

## 2021-02-26 NOTE — Therapy (Signed)
[facility] [address] [place], [place], [address] Phone: [phone]   Fax:  [phone]  Physical Therapy Treatment  Patient Details  Name: Kimberly Monroe MRN: [id number] Date of Birth: Apr 24, 1961 Referring Provider (PT): Malachy Chamber MD   Encounter Date: 02/26/2021   PT End of Session - 02/26/21 1001    Visit Number 9    Number of Visits 24    Date for PT Re-Evaluation 05/13/21    Authorization Type UHC VL- 23 PT, with 19 remaining/ 23 ST - 20 remaining - Insurance changing to [place] in June    Authorization - Visit Number 2    Authorization - Number of Visits 18   authorized visits pt has left as of 02/12/21   PT Start Time 0900    PT Stop Time 0938    PT Time Calculation (min) 38 min    Activity Tolerance Other (comment)   mild nausea   Behavior During Therapy [facility] for tasks assessed/performed           Past Medical History:  Diagnosis Date  . Allergy    seasonal  . Anemia   . Anxiety   . 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 Basal cell cancer   . Cognitive changes   . Depression   . Goals of care, counseling/discussion 11/02/2019  . History of blood clotting disorder   . Hx of cerebral artery stenosis   . Hypertension   . Melanoma ([place]) 2009   insitu  . Menorrhagia 02/10/2013  . Migraine    history of/none in years  . Ovarian cancer ([place]) 1995   left ovary  . Pulmonary embolism ([place])   . Squamous cell carcinoma   . Stroke [facility])     Past Surgical History:  Procedure Laterality Date  . ABDOMINAL HYSTERECTOMY  03/06/2013  . BREAST DUCTAL SYSTEM EXCISION  2009   L intraductal papilloma  . CEREBRAL ANGIOGRAM  2020  . [place]   twins, singleton  . IR ANGIO INTRA EXTRACRAN SEL COM CAROTID INNOMINATE BILAT MOD SED  11/05/2018  . IR ANGIO VERTEBRAL SEL SUBCLAVIAN INNOMINATE UNI L MOD SED  11/05/2018  . IR ANGIO VERTEBRAL SEL VERTEBRAL UNI R MOD SED  11/05/2018  . IR US  GUIDE VASC ACCESS RIGHT  11/05/2018  . laparoscopy with ovarian cystectomy  1994   ovarian torsion  . 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 MOHS SURGERY  2009   Dr Link Snuffer    There were no vitals filed for this visit.   Subjective Assessment - 02/26/21 0902    Subjective Feeling "off" today.  Still has single point cane, no quad tip - pt requested assistance with ordering quad tip on her phone on [place] [employer].  Had visit with radiology yesterday to go over MRI results.  Will continue to monitor.   Asking about saccades and how that relates to balance.    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    Currently in Pain? Yes                             [place]  Adult PT Treatment/Exercise - 02/26/21 0943      Transfers   Transfers Sit to Stand;Stand to Sit;Stand Pivot Transfers    Sit to Stand 4: Min assist    Sit to Stand Details (indicate cue type and reason) following compensatory saccade training in sitting, incorporated into sit > stand with use of gaze stabilization on target in front of patient while performing sit > stand x 3 reps with one UE support on cane and one pushing from chair, verbal cues for sequencing and to stablize once standing    Stand to Sit 4: Min assist    Stand to Sit Details use of compensatory saccade and gaze stabilization on low target while to improve balance while performing stand > sit; performed x 3 reps.    Stand Pivot Transfers 4: Min assist    Stand Pivot Transfer Details (indicate cue type and reason) Use of compensatory Saccades and spotting on stationary target when performing pivoting from chair > mat to move gaze up from the floor.  Verbal cues to sequence       Self-Care   Self-Care Other Self-Care Comments    Other Self-Care Comments  Pt having difficulty choosing rubber quad tip and asked PT to assist with purchasing online.  PT performed search of item on patient's phone on [employer] and showed pt all options available including pricing.  Pt made her selection and gave PT permission to proceed with purchasing.  Scheduled to be delivered to patient on Thursday.  Pt appreciative of assistance.           Vestibular Treatment/Exercise - 02/26/21 0915      Vestibular Treatment/Exercise   Vestibular Treatment Provided Gaze    Gaze Exercises Eye/Head Exercise Horizontal;Eye/Head Exercise Vertical      Eye/Head Exercise Horizontal   Foot Position seated with back and foot support    Reps 6    Comments mild nausea initially; verbal cues for breathing, grounding into seat and feet to stabilize.      Eye/Head Exercise Vertical   Foot Position seated with back and foot support    Reps 6    Comments mild nausea initially; verbal cues for breathing, grounding into seat and feet to stabilize.                 PT Education - 02/26/21 1000    Education Details assistance with ordering quad tip for cane; educated pt on purpose of oculomotor function and gaze stabilization for head/trunk righting and postural control while reaching, transferring and ambulating to make movement more efficient and safe.  Educated pt on visual motion sensitivity and how that affects her gaze, stability and balance.    Person(s) Educated Patient    Methods Explanation    Comprehension Verbalized understanding            PT Short Term Goals - 02/12/21 1138      PT SHORT TERM GOAL #1   Title Pt will be independent with HEP with assist/cues from daughter in order to improve compliance at home.    Baseline pt has not been performing HEP consistently due to cognitive deficits - daughter not present with pt to review. ALL STGS DUE 03/12/21.    Time 4    Period Weeks     Status New    Target Date 03/12/21      PT SHORT TERM GOAL #2   Title Pt will undergo trial of RW vs. rollator to determine appropriate AD in the  home and in the community in order to decr fall risk and improve safety.    Baseline uses SPC on 01/24/21    Time 4    Period Weeks    Status New      PT SHORT TERM GOAL #3   Title Pt will improve gait speed to at least 1.0 ft/sec with appropriate AD in order to demo decr fall risk.    Baseline .29 ft/sec with SPC on 01/30/21    Time 4    Period Weeks    Status New      PT SHORT TERM GOAL #4   Title Pt will demo compensatory saccades with supervision with sit <> stands and gait/standing with head motions in order to demo improved functional mobility.    Time 4    Period Weeks    Status New      PT SHORT TERM GOAL #5   Title Pt will undergo TUG with appropriate AD with LTG written as appropriate.    Time 4    Period Weeks    Status New             PT Long Term Goals - 02/12/21 1142      PT LONG TERM GOAL #1   Title TUG goal to be written as appropriate to decr fall risk with appropriate AD. ALL LTGS DUE 04/09/21.    Time 8    Period Weeks    Status New    Target Date 04/09/21      PT LONG TERM GOAL #2   Title Pt will ambulate with appropriate AD 87' with supervision around clinic gym in order to demo improved household mobility.    Baseline with SPC, needs min guard at times for safety, pt unable to tolerate clinic environment    Time 8    Period Weeks    Status New      PT LONG TERM GOAL #3   Title Pt will decr 5x sit <> stand to 40 seconds or less with BUE support to demo decr fall risk and improved functional BLE strength.    Baseline 62 seconds on 01/24/21    Time 8    Period Weeks    Status New      PT LONG TERM GOAL #4   Title Pt will rate positional sensitivities  on the MSQ to a 1 or less in order to demo improved motion sensitivity.    Time 8    Period Weeks    Status New      PT LONG TERM GOAL #5   Title  Pt will improve gait speed to at least 1.0 ft/sec with appropriate AD in order to demo decr fall risk.    Baseline .82 ft/sec with SPC    Time 8    Period Weeks    Status New                 Plan - 02/26/21 1002    Clinical Impression Statement Assisted pt with ordering quad tip for cane to improve BOS when standing and ambulating due to ongoing falls when cane falls and pt goes to reach for it.  Continued to provide pt with education about visual-vestibular interaction and initiated training of compensatory saccades for gaze stabilization when performing sit <> stand and stand pivot transfers.  Pt reported mild nausea when performing but able to tolerate.  Will continue to address and progress towards LTG.    Personal Factors and Comorbidities  Comorbidity 3+    Comorbidities brain cancer, cva, headaches, stenosis, dizziness    Examination-Activity Limitations Locomotion Level;Stairs;Transfers    Examination-Participation Restrictions Community Activity;Driving    Rehab Potential Good    PT Frequency 2x / week   1-2x a week   PT Duration 8 weeks    PT Treatment/Interventions ADLs/Self Care Home Management;Neuromuscular re-education;Balance training;Therapeutic exercise;Therapeutic activities;Stair training;Gait training;Patient/family education;Manual techniques;DME Instruction;Visual/perceptual remediation/compensation;Vestibular;Passive range of motion    PT Next Visit Plan She should have quad tip by next session!  Treat in private room with lights dimmed. Keep using compensatory saccades with head turns for sit <> stand, stand pivot and walking. may want to see about RW/rollator for safety with gait. Balance with decreased UE support in corner, head turns, weight shifting activities with BUE support.    PT Home Exercise Plan [id number]    Consulted and Agree with Plan of Care Patient           Patient will benefit from skilled therapeutic intervention in order to improve the  following deficits and impairments:  Abnormal gait,Decreased range of motion,Impaired UE functional use,Decreased balance,Decreased strength,Postural dysfunction,Impaired flexibility,Dizziness,Decreased activity tolerance,Decreased coordination,Impaired sensation,Difficulty walking,Decreased knowledge of use of DME  Visit Diagnosis: Unsteadiness on feet  Dizziness and giddiness  Other abnormalities of gait and mobility     Problem List Patient Active Problem List   Diagnosis Date Noted  . Hyperlipidemia 01/11/2020  . 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 Goals of care, counseling/discussion 11/02/2019  . Abnormal brain MRI 12/07/2018  . Confusion 12/07/2018  . TIA (transient ischemic attack) 11/04/2018  . CVA (cerebral vascular accident) ([place]) 03/29/2018  . Essential hypertension 06/20/2016  . Suicidal ideation 09/20/2013  . MDD (major depressive disorder) 09/20/2013  . Menorrhagia 02/10/2013  . Pulmonary embolism ([place]) 09/09/2012  . History of pulmonary embolism 09/06/2012  . Acute respiratory failure with hypoxia ([place]) 02/04/2012  . Bilateral pulmonary embolism ([place]) 02/04/2012  . Hypokalemia 02/04/2012  . Hyponatremia 02/04/2012  . Elevation of cardiac enzymes 02/04/2012  . Family History of hypercoagulable state 02/04/2012  . History of squamous cell carcinoma of skin 12/17/2011  . History  of basal cell carcinoma 12/17/2011  . Depression 12/17/2011  . Iron deficiency anemia - severe 12/17/2011  . Melanoma ([place])     Rico Junker, PT, DPT 02/26/21    10:05 AM    [facility] [address] [place], [place], [address] Phone: [phone]   Fax:  [phone]  Name: JENSINE LUZ MRN: [id number] Date of Birth: [birth year]

## 2021-02-26 NOTE — Patient Instructions (Signed)
  1. SHOULDER: Flexion - Supine (Cane)    Hold paper towel roll in both hands, palms facing. Raise arms up overhead. Do not allow back to arch. Hold ___ seconds. _5__ reps per set, 2 sets, 2 sessions per day.    2. ROM: External Rotation - Wand (Supine)    Lie on back holding cane with elbows bent to 90. Rotate forearms over head as far as possible, then towards belly button. Keep upper arms out to side Repeat __5__ times per set. Do __2__ sets per session. Do __2__ sessions per day.  1. Grip Strengthening (Resistive Putty)   Squeeze putty using thumb and all fingers. Repeat _15-20___ times. Do __2__ sessions per day.   2. Roll putty into tube on table and pinch between first two fingers and thumb x 10 reps. Do 2 sessions per day

## 2021-02-26 NOTE — Therapy (Signed)
[facility] [address] [place], [place], [address] Phone: [phone]   Fax:  [phone]  Occupational Therapy Treatment  Patient Details  Name: Kimberly Monroe MRN: [id number] Date of Birth: October 20, 1960 Referring Provider (OT): Dr. Malachy Chamber   Encounter Date: 02/26/2021   OT End of Session - 02/26/21 1233    Visit Number 2    Number of Visits 17    Date for OT Re-Evaluation 04/14/21    Authorization Type Currently [employer] but may change - 23 visits for OT per year    Authorization - Number of Visits 23    OT Start Time 0935    OT Stop Time 1015    OT Time Calculation (min) 40 min    Activity Tolerance Patient tolerated treatment well    Behavior During Therapy [facility] for tasks assessed/performed           Past Medical History:  Diagnosis Date  . Allergy    seasonal  . Anemia   . Anxiety   . 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 Basal cell cancer   . Cognitive changes   . Depression   . Goals of care, counseling/discussion 11/02/2019  . History of blood clotting disorder   . Hx of cerebral artery stenosis   . Hypertension   . Melanoma ([place]) 2009   insitu  . Menorrhagia 02/10/2013  . Migraine    history of/none in years  . Ovarian cancer ([place]) 1995   left ovary  . Pulmonary embolism ([place])   . Squamous cell carcinoma   . Stroke [facility])     Past Surgical History:  Procedure Laterality Date  . ABDOMINAL HYSTERECTOMY  03/06/2013  . BREAST DUCTAL SYSTEM EXCISION  2009   L intraductal papilloma  . CEREBRAL ANGIOGRAM  2020  . [place]   twins, singleton  . IR ANGIO INTRA EXTRACRAN SEL COM CAROTID INNOMINATE BILAT MOD SED  11/05/2018  . IR ANGIO VERTEBRAL SEL SUBCLAVIAN INNOMINATE UNI L MOD SED  11/05/2018  . IR ANGIO VERTEBRAL SEL VERTEBRAL UNI R MOD SED  11/05/2018  . IR US GUIDE VASC ACCESS RIGHT  11/05/2018  . laparoscopy with ovarian cystectomy  1994   ovarian torsion  .  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 MOHS SURGERY  2009   Dr Link Snuffer    There were no vitals filed for this visit.   Subjective Assessment - 02/26/21 0938    Subjective  I'm more dizzy today    Pertinent History diffuse astrocytoma (malignant neoplasm) diagnosed 09/2019. PMHx of basal and squamous cell carcinoma, melanoma, intraductal papilloma of breast, ovarian cancer, PE (on Xarelto),? CVA, Fe-deficient anemia, HTN, HLD, hx cerebral artery stenosis, depression/anxiety, and migraines    Limitations Fall risk, no driving    Patient Stated Goals Decrease family burden of care and increase compensatory strategies for ADLS    Currently in Pain? Yes    Pain Location Head    Pain Orientation Left;Anterior    Pain Descriptors / Indicators Dull;Headache    Pain Type Acute pain    Pain Onset 1 to 4 weeks ago    Pain Frequency Constant    Aggravating Factors  too much light or noise    Pain Relieving Factors darker quiet room          Pt seen in quiet room due to sensitivity to light and noise.  Pt carefully placed supine (2 pillows under head  and bolster under knees) and performed modified BUE sh flexion ex (w/ thick foam roll vs. Cane due to deltoid pain - modifying helped reduce middle deltoid activation/pain) and bilateral ER/IR w/ arms abducted using cane.  Seated: issued putty HEP w/ red resistance putty - pt demo each as indicated.  Pt c/o middle deltoid pain d/t shoulder compensations therefore applied kinesiotape to relax middle deltoid and educated on how long to keep on and proper shoulder positioning w/ reaching.                       OT Education - 02/26/21 1003    Education Details shoulder HEP, putty HEP, kinesiotape wear and care    Person(s) Educated Patient    Methods Explanation;Demonstration;Verbal cues;Handout    Comprehension Verbalized understanding;Returned demonstration            OT Short Term Goals - 02/12/21 1354      OT SHORT TERM  GOAL #1   Title Independent with Rt shoulder HEP    Time 4    Period Weeks    Status New      OT SHORT TERM GOAL #2   Title Independent with coordination and putty HEP for Rt hand    Time 4    Period Weeks    Status New      OT SHORT TERM GOAL #3   Title Pt to verbalize understanding with external cues/compensations and potential A/E to increase safety/independence with ADLS due to cognitive deficits    Time 4    Period Weeks    Status New      OT SHORT TERM GOAL #4   Title Pt to verbalize understanding with safety considerations d/t decreased sensation Rt hand and to report overall less drops Rt hand    Time 4    Period Weeks    Status New             OT Long Term Goals - 02/12/21 1358      OT LONG TERM GOAL #1   Title Pt/family to verbalize understanding with acquiring grab bars for shower    Time 8    Period Weeks    Status New      OT LONG TERM GOAL #2   Title Pt to consistently make own cold meals/sandwiches/microwaveable items I'ly and assist with prep work of dinner    Time 8    Period Weeks    Status New      OT LONG TERM GOAL #3   Title Pt to improve coordination Rt hand as evidenced by reducing speed on 9 hole peg test to 55 sec or less    Baseline 69 sec    Time 8    Period Weeks    Status New      OT LONG TERM GOAL #4   Title Pt to improve grip strength Rt hand to 30 lbs or greater to assist with opening items    Baseline 26 lbs    Time 8    Period Weeks    Status New      OT LONG TERM GOAL #5   Title Pt to use RUE to assist in brushing and washing hair    Baseline currenlty w/ LUE d/t limited Rt shoulder ROM and pain    Time 8    Period Weeks    Status New  Plan - 02/26/21 1234    Clinical Impression Statement Pt returns today for first O.T. session following evaluation. Pt w/ increased dizziness today. Pt tolerating HEP well    OT Occupational Profile and History Detailed Assessment- Review of Records and  additional review of physical, cognitive, psychosocial history related to current functional performance    Occupational performance deficits (Please refer to evaluation for details): ADL's;IADL's;Social Participation    Body Structure / Function / Physical Skills ADL;Strength;Dexterity;Pain;Proprioception;UE functional use;IADL;ROM;Sensation;Coordination;FMC    Cognitive Skills Problem Solve;Perception;Sequencing    Rehab Potential Good    Clinical Decision Making Several treatment options, min-mod task modification necessary    Comorbidities Affecting Occupational Performance: Presence of comorbidities impacting occupational performance    Comorbidities impacting occupational performance description: malignant brain cancer    OT Frequency 2x / week    OT Duration 8 weeks   plus eval (may reduce duration prn based on pts progress)   OT Treatment/Interventions Self-care/ADL training;DME and/or AE instruction;Moist Heat;Therapeutic activities;Coping strategies training;Cognitive remediation/compensation;Therapeutic exercise;Neuromuscular education;Passive range of motion;Functional Mobility Training;Visual/perceptual remediation/compensation;Patient/family education;Energy conservation;Manual Therapy    Plan review HEP, issue coordination ex's, assess if kinesiotape helped deltoid pain and reapply next week if it helped    Consulted and Agree with Plan of Care Patient           Patient will benefit from skilled therapeutic intervention in order to improve the following deficits and impairments:   Body Structure / Function / Physical Skills: ADL,Strength,Dexterity,Pain,Proprioception,UE functional use,IADL,ROM,Sensation,Coordination,FMC Cognitive Skills: Problem Solve,Perception,Sequencing     Visit Diagnosis: Stiffness of right shoulder, not elsewhere classified  Right shoulder pain, unspecified chronicity  Muscle weakness (generalized)    Problem List Patient Active Problem List    Diagnosis Date Noted  . Hyperlipidemia 01/11/2020  . 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 Goals of care, counseling/discussion 11/02/2019  . Abnormal brain MRI 12/07/2018  . Confusion 12/07/2018  . TIA (transient ischemic attack) 11/04/2018  . CVA (cerebral vascular accident) ([place]) 03/29/2018  . Essential hypertension 06/20/2016  . Suicidal ideation 09/20/2013  . MDD (major depressive disorder) 09/20/2013  . Menorrhagia 02/10/2013  . Pulmonary embolism ([place]) 09/09/2012  . History of pulmonary embolism 09/06/2012  . Acute respiratory failure with hypoxia ([place]) 02/04/2012  . Bilateral pulmonary embolism ([place]) 02/04/2012  . Hypokalemia 02/04/2012  . Hyponatremia 02/04/2012  . Elevation of cardiac enzymes 02/04/2012  . Family History of hypercoagulable state 02/04/2012  . History of squamous cell carcinoma of skin 12/17/2011  . History  of basal cell carcinoma 12/17/2011  . Depression 12/17/2011  . Iron deficiency anemia - severe 12/17/2011  . Melanoma [facility])     Carey Bullocks, OTR/L 02/26/2021, 12:37 PM  [facility] [address] [place], [place], [address] Phone: [phone]   Fax:  [phone]  Name: Kimberly Monroe MRN: [id number] Date of Birth: June 17, 1961

## 2021-02-26 NOTE — Patient Instructions (Signed)
   Think about what goal you would have for communication Examples: Coming up with phrases/sentences to copy and paste for texting 1,2,3,4 people a week Listing the people you would like to send thank you cards to and doing 1,2 cards/week Develop a system for obtaining your medical notes

## 2021-02-28 ENCOUNTER — Ambulatory Visit: Payer: 59

## 2021-02-28 ENCOUNTER — Encounter: Payer: Self-pay | Admitting: Physical Therapy

## 2021-02-28 ENCOUNTER — Ambulatory Visit: Payer: 59 | Admitting: Physical Therapy

## 2021-03-05 ENCOUNTER — Ambulatory Visit: Payer: 59 | Admitting: Physical Therapy

## 2021-03-05 ENCOUNTER — Ambulatory Visit: Payer: 59 | Admitting: Occupational Therapy

## 2021-03-05 ENCOUNTER — Ambulatory Visit: Payer: 59

## 2021-03-05 ENCOUNTER — Encounter: Payer: Self-pay | Admitting: Physical Therapy

## 2021-03-05 ENCOUNTER — Other Ambulatory Visit: Payer: Self-pay

## 2021-03-05 DIAGNOSIS — R41840 Attention and concentration deficit: Secondary | ICD-10-CM | POA: Diagnosis not present

## 2021-03-05 DIAGNOSIS — R41841 Cognitive communication deficit: Secondary | ICD-10-CM

## 2021-03-05 DIAGNOSIS — R2681 Unsteadiness on feet: Secondary | ICD-10-CM

## 2021-03-05 DIAGNOSIS — R42 Dizziness and giddiness: Secondary | ICD-10-CM

## 2021-03-05 DIAGNOSIS — M6281 Muscle weakness (generalized): Secondary | ICD-10-CM | POA: Diagnosis not present

## 2021-03-05 DIAGNOSIS — M25511 Pain in right shoulder: Secondary | ICD-10-CM

## 2021-03-05 DIAGNOSIS — M25611 Stiffness of right shoulder, not elsewhere classified: Secondary | ICD-10-CM

## 2021-03-05 DIAGNOSIS — R4701 Aphasia: Secondary | ICD-10-CM | POA: Diagnosis not present

## 2021-03-05 DIAGNOSIS — R278 Other lack of coordination: Secondary | ICD-10-CM

## 2021-03-05 DIAGNOSIS — R2689 Other abnormalities of gait and mobility: Secondary | ICD-10-CM | POA: Diagnosis not present

## 2021-03-05 NOTE — Patient Instructions (Signed)
   Homework: Write one card each day!    Use your phone to augment your memory  Take extra time when you have questions answered to make sure you understand the answer and have not just typed out the answer  Make sure you ask for a summary of MD visit - this will help you understand and to be able to look over what was discussed

## 2021-03-05 NOTE — Therapy (Signed)
[facility] [address] [place], [place], [address] Phone: [phone]   Fax:  [phone]  Occupational Therapy Treatment  Patient Details  Name: Kimberly Monroe MRN: [id number] Date of Birth: 08-20-1961 Referring Provider (OT): Dr. Malachy Chamber   Encounter Date: 03/05/2021   OT End of Session - 03/05/21 0938    Visit Number 3    Number of Visits 17    Date for OT Re-Evaluation 04/14/21    Authorization Type Currently [employer] but may change - 23 visits for OT per year    Authorization - Number of Visits 23    OT Start Time 0845    OT Stop Time 0930    OT Time Calculation (min) 45 min    Activity Tolerance Patient tolerated treatment well    Behavior During Therapy [facility] for tasks assessed/performed           Past Medical History:  Diagnosis Date  . Allergy    seasonal  . Anemia   . Anxiety   . 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 Basal cell cancer   . Cognitive changes   . Depression   . Goals of care, counseling/discussion 11/02/2019  . History of blood clotting disorder   . Hx of cerebral artery stenosis   . Hypertension   . Melanoma ([place]) 2009   insitu  . Menorrhagia 02/10/2013  . Migraine    history of/none in years  . Ovarian cancer ([place]) 1995   left ovary  . Pulmonary embolism ([place])   . Squamous cell carcinoma   . Stroke [facility])     Past Surgical History:  Procedure Laterality Date  . ABDOMINAL HYSTERECTOMY  03/06/2013  . BREAST DUCTAL SYSTEM EXCISION  2009   L intraductal papilloma  . CEREBRAL ANGIOGRAM  2020  . [place]   twins, singleton  . IR ANGIO INTRA EXTRACRAN SEL COM CAROTID INNOMINATE BILAT MOD SED  11/05/2018  . IR ANGIO VERTEBRAL SEL SUBCLAVIAN INNOMINATE UNI L MOD SED  11/05/2018  . IR ANGIO VERTEBRAL SEL VERTEBRAL UNI R MOD SED  11/05/2018  . IR US GUIDE VASC ACCESS RIGHT  11/05/2018  . laparoscopy with ovarian cystectomy  1994   ovarian torsion  .  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 MOHS SURGERY  2009   Dr Link Snuffer    There were no vitals filed for this visit.   Subjective Assessment - 03/05/21 0849    Subjective  The tape worked well    Pertinent History diffuse astrocytoma (malignant neoplasm) diagnosed 09/2019. PMHx of basal and squamous cell carcinoma, melanoma, intraductal papilloma of breast, ovarian cancer, PE (on Xarelto),? CVA, Fe-deficient anemia, HTN, HLD, hx cerebral artery stenosis, depression/anxiety, and migraines    Limitations Fall risk, no driving    Patient Stated Goals Decrease family burden of care and increase compensatory strategies for ADLS    Currently in Pain? Yes    Pain Score --   pt doesn't rate   Pain Location Shoulder    Pain Orientation Right    Pain Descriptors / Indicators Dull    Pain Type Chronic pain    Pain Onset 1 to 4 weeks ago    Pain Frequency Intermittent    Aggravating Factors  malpositioning    Pain Relieving Factors taping          Reviewed theraputty HEP. Issued coordination HEP and reviewed - recommended supporting forearm on table to stabilize hand  and prevent tremors. Practiced writing with various A/E on pens. Pt issued tripod grip for pen.   O.T. reapplied kinesiotape to relax middle deltoid of Rt shoulder during a break in pt's P.T. session. Pt instructed to doff Saturday if still on. No charge for this.                     OT Education - 03/05/21 [phone]    Education Details coordination HEP    Person(s) Educated Patient    Methods Explanation;Demonstration;Verbal cues;Handout    Comprehension Verbalized understanding;Returned demonstration;Verbal cues required            OT Short Term Goals - 03/05/21 0938      OT SHORT TERM GOAL #1   Title Independent with Rt shoulder HEP    Time 4    Period Weeks    Status On-going      OT SHORT TERM GOAL #2   Title Independent with coordination and putty HEP for Rt hand    Time 4    Period Weeks     Status On-going      OT SHORT TERM GOAL #3   Title Pt to verbalize understanding with external cues/compensations and potential A/E to increase safety/independence with ADLS due to cognitive deficits    Time 4    Period Weeks    Status New      OT SHORT TERM GOAL #4   Title Pt to verbalize understanding with safety considerations d/t decreased sensation Rt hand and to report overall less drops Rt hand    Time 4    Period Weeks    Status New             OT Long Term Goals - 02/12/21 1358      OT LONG TERM GOAL #1   Title Pt/family to verbalize understanding with acquiring grab bars for shower    Time 8    Period Weeks    Status New      OT LONG TERM GOAL #2   Title Pt to consistently make own cold meals/sandwiches/microwaveable items I'ly and assist with prep work of dinner    Time 8    Period Weeks    Status New      OT LONG TERM GOAL #3   Title Pt to improve coordination Rt hand as evidenced by reducing speed on 9 hole peg test to 55 sec or less    Baseline 69 sec    Time 8    Period Weeks    Status New      OT LONG TERM GOAL #4   Title Pt to improve grip strength Rt hand to 30 lbs or greater to assist with opening items    Baseline 26 lbs    Time 8    Period Weeks    Status New      OT LONG TERM GOAL #5   Title Pt to use RUE to assist in brushing and washing hair    Baseline currenlty w/ LUE d/t limited Rt shoulder ROM and pain    Time 8    Period Weeks    Status New                 Plan - 03/05/21 3329    Clinical Impression Statement Pt responded well to kinesiotape Rt shoulder. Pt tolerating coordination HEP w/ motor planning deficits on specific ex    OT Occupational Profile and History Detailed Assessment- Review of  Records and additional review of physical, cognitive, psychosocial history related to current functional performance    Occupational performance deficits (Please refer to evaluation for details): ADL's;IADL's;Social Participation     Body Structure / Function / Physical Skills ADL;Strength;Dexterity;Pain;Proprioception;UE functional use;IADL;ROM;Sensation;Coordination;FMC    Cognitive Skills Problem Solve;Perception;Sequencing    Rehab Potential Good    Clinical Decision Making Several treatment options, min-mod task modification necessary    Comorbidities Affecting Occupational Performance: Presence of comorbidities impacting occupational performance    Comorbidities impacting occupational performance description: malignant brain cancer    OT Frequency 2x / week    OT Duration 8 weeks   plus eval (may reduce duration prn based on pts progress)   OT Treatment/Interventions Self-care/ADL training;DME and/or AE instruction;Moist Heat;Therapeutic activities;Coping strategies training;Cognitive remediation/compensation;Therapeutic exercise;Neuromuscular education;Passive range of motion;Functional Mobility Training;Visual/perceptual remediation/compensation;Patient/family education;Energy conservation;Manual Therapy    Plan review shoulder HEP and coordination HEP prn. Reinforce proper positioning Rt shoulder w/ reaching, discuss safety considerations w/ lack of sensation and any external cues for memory loss (calendar, alarms, etc) - needs to be in private room w/ dimmed lights    Consulted and Agree with Plan of Care Patient           Patient will benefit from skilled therapeutic intervention in order to improve the following deficits and impairments:   Body Structure / Function / Physical Skills: ADL,Strength,Dexterity,Pain,Proprioception,UE functional use,IADL,ROM,Sensation,Coordination,FMC Cognitive Skills: Problem Solve,Perception,Sequencing     Visit Diagnosis: Stiffness of right shoulder, not elsewhere classified  Right shoulder pain, unspecified chronicity  Muscle weakness (generalized)  Other lack of coordination    Problem List Patient Active Problem List   Diagnosis Date Noted  . Hyperlipidemia  01/11/2020  . 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 Goals of care, counseling/discussion 11/02/2019  . Abnormal brain MRI 12/07/2018  . Confusion 12/07/2018  . TIA (transient ischemic attack) 11/04/2018  . CVA (cerebral vascular accident) ([place]) 03/29/2018  . Essential hypertension 06/20/2016  . Suicidal ideation 09/20/2013  . MDD (major depressive disorder) 09/20/2013  . Menorrhagia 02/10/2013  . Pulmonary embolism ([place]) 09/09/2012  . History of pulmonary embolism 09/06/2012  . Acute respiratory failure with hypoxia ([place]) 02/04/2012  . Bilateral pulmonary embolism ([place]) 02/04/2012  . Hypokalemia 02/04/2012  . Hyponatremia 02/04/2012  . Elevation of cardiac enzymes 02/04/2012  . Family History of hypercoagulable state 02/04/2012  . History of squamous cell carcinoma of skin 12/17/2011  . History  of basal cell carcinoma 12/17/2011  . Depression 12/17/2011  . Iron deficiency anemia - severe 12/17/2011  . Melanoma ([place])     Carey Bullocks, OTR/L 03/05/2021, 9:43 AM  [facility] [address] [place], [place], [address] Phone: [phone]   Fax:  [phone]  Name: DAWN CONVERY MRN: [id number] Date of Birth: [birth year]

## 2021-03-05 NOTE — Therapy (Addendum)
[facility] [address] [place], [place], [address] Phone: [phone]   Fax:  [phone]  Physical Therapy Treatment  Patient Details  Name: Kimberly Monroe MRN: [id number] Date of Birth: Mar 24, 1961 Referring Provider (PT): Malachy Chamber MD   Encounter Date: 03/05/2021   PT End of Session - 03/05/21 1024    Visit Number 10    Number of Visits 24    Date for PT Re-Evaluation 05/13/21    Authorization Type UHC VL- 23 PT, with 19 remaining/ 65 ST - 20 remaining - Insurance changing to [employer] in June    Authorization - Visit Number 3    Authorization - Number of Visits 18   authorized visits pt has left as of 02/12/21   PT Start Time 0932    PT Stop Time 1016    PT Time Calculation (min) 44 min    Activity Tolerance Other (comment)   mild nausea   Behavior During Therapy [facility] for tasks assessed/performed           Past Medical History:  Diagnosis Date  . Allergy    seasonal  . Anemia   . Anxiety   . 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 Basal cell cancer   . Cognitive changes   . Depression   . Goals of care, counseling/discussion 11/02/2019  . History of blood clotting disorder   . Hx of cerebral artery stenosis   . Hypertension   . Melanoma ([place]) 2009   insitu  . Menorrhagia 02/10/2013  . Migraine    history of/none in years  . Ovarian cancer ([place]) 1995   left ovary  . Pulmonary embolism ([place])   . Squamous cell carcinoma   . Stroke [facility])     Past Surgical History:  Procedure Laterality Date  . ABDOMINAL HYSTERECTOMY  03/06/2013  . BREAST DUCTAL SYSTEM EXCISION  2009   L intraductal papilloma  . CEREBRAL ANGIOGRAM  2020  . [place]   twins, singleton  . IR ANGIO INTRA EXTRACRAN SEL COM CAROTID INNOMINATE BILAT MOD SED  11/05/2018  . IR ANGIO VERTEBRAL SEL SUBCLAVIAN INNOMINATE UNI L MOD SED  11/05/2018  . IR ANGIO VERTEBRAL SEL VERTEBRAL UNI R MOD SED  11/05/2018  . IR US  GUIDE VASC ACCESS RIGHT  11/05/2018  . laparoscopy with ovarian cystectomy  1994   ovarian torsion  . 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 MOHS SURGERY  2009   Dr Link Snuffer    There were no vitals filed for this visit.   Subjective Assessment - 03/05/21 0935    Subjective Got the quad tip for her cane. Is having to look down to make sure she knows where she is placing it. Insurance is switching over this week. Tripped and lost her balance, but caught herself on the wall.    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                             [facility] Adult PT Treatment/Exercise - 03/05/21 0946      Transfers   Transfers Sit to Stand;Stand to Sit;Stand  Pivot Transfers    Sit to Stand 4: Min guard    Sit to Stand Details (indicate cue type and reason) with use of gaze stabilization - cues to look up at target and having single UE support on chair and one on cane, reviewed technique from previous session and performed x4 reps throughout session    Stand to Sit 4: Min guard    Stand to Sit Details with use of compensatory saccades, looking towards lower target prior to sitting back down, pt reports incr difficulty with sitting back down, performed x4 reps throughout session      Ambulation/Gait   Ambulation/Gait Yes    Ambulation/Gait Assistance 4: Min guard;5: Supervision    Ambulation/Gait Assistance Details small distances trialing RW and rollator. pt able to maintain consistent step through pattern with use of both, pt initially with diffuclty turning with RW, but improved with incr distances. limited to short distances due to pt having nausea when walking down hallway. also trialed rollator, with  initial cues for proper brake management before standing and sitting, pt with no issues with balance when ambulating with rollator    Ambulation Distance (Feet) 80 Feet   x2   Assistive device Rolling walker;Rollator    Gait Pattern Wide base of support;Decreased hip/knee flexion - right;Decreased hip/knee flexion - left;Step-through pattern;Decreased step length - right    Ambulation Surface Level;Indoor    Gait Comments discussed with pt the pro/cons of rollator and RW, pt not sure which one she feels more comfortable with and secure at this time,will continue to practice in future sessions to incr stability and safety during gait.      Neuro Re-ed    Neuro Re-ed Details  compensatory saccades: in sitting from midline to R (therapist holding targets) x5 reps and then midline to L x5 reps. attempted one card on pt's R and L, pt reporting that is too much head motion to perform at this time. then performed same activity in standing with pt having UE support on cane, pt with incr difficulty and needing to use chair at times for balance when performing saccade from R back to midline. Needing rest breaks intermittently                  PT Education - 03/05/21 1026    Education Details reviewed purpose of saccades during gait and functional transfers to improve safety and decr sx of dizziness/nausea.    Person(s) Educated Patient    Methods Explanation    Comprehension Verbalized understanding            PT Short Term Goals - 02/12/21 1138      PT SHORT TERM GOAL #1   Title Pt will be independent with HEP with assist/cues from daughter in order to improve compliance at home.    Baseline pt has not been performing HEP consistently due to cognitive deficits - daughter not present with pt to review. ALL STGS DUE 03/12/21.    Time 4    Period Weeks    Status New    Target Date 03/12/21      PT SHORT TERM GOAL #2   Title Pt will undergo trial of RW vs. rollator to determine appropriate  AD in the home and in the community in order to decr fall risk and improve safety.    Baseline uses SPC on 01/24/21    Time 4    Period Weeks    Status New  PT SHORT TERM GOAL #3   Title Pt will improve gait speed to at least 1.0 ft/sec with appropriate AD in order to demo decr fall risk.    Baseline .48 ft/sec with SPC on 01/30/21    Time 4    Period Weeks    Status New      PT SHORT TERM GOAL #4   Title Pt will demo compensatory saccades with supervision with sit <> stands and gait/standing with head motions in order to demo improved functional mobility.    Time 4    Period Weeks    Status New      PT SHORT TERM GOAL #5   Title Pt will undergo TUG with appropriate AD with LTG written as appropriate.    Time 4    Period Weeks    Status New             PT Long Term Goals - 02/12/21 1142      PT LONG TERM GOAL #1   Title TUG goal to be written as appropriate to decr fall risk with appropriate AD. ALL LTGS DUE 04/09/21.    Time 8    Period Weeks    Status New    Target Date 04/09/21      PT LONG TERM GOAL #2   Title Pt will ambulate with appropriate AD 46' with supervision around clinic gym in order to demo improved household mobility.    Baseline with SPC, needs min guard at times for safety, pt unable to tolerate clinic environment    Time 8    Period Weeks    Status New      PT LONG TERM GOAL #3   Title Pt will decr 5x sit <> stand to 40 seconds or less with BUE support to demo decr fall risk and improved functional BLE strength.    Baseline 62 seconds on 01/24/21    Time 8    Period Weeks    Status New      PT LONG TERM GOAL #4   Title Pt will rate positional sensitivities  on the MSQ to a 1 or less in order to demo improved motion sensitivity.    Time 8    Period Weeks    Status New      PT LONG TERM GOAL #5   Title Pt will improve gait speed to at least 1.0 ft/sec with appropriate AD in order to demo decr fall risk.    Baseline .82 ft/sec with SPC     Time 8    Period Weeks    Status New                 Plan - 03/05/21 1431    Clinical Impression Statement Trialed both use of RW and rollator with short distance gait today to improve balance and safety. Pt able to ambulate with both with supervision, discussed pros and cons of both with pt. Will continue to practice with both in session to determine best option. Continued to use compensatory saccades for gaze stabilization during sit <> stands and initiated saccades to perform with head motions. Pt with incr difficulty with compensatory saccades when moving eyes/head to the R. Pt reporting mild nausea throughout session, needing brief seated rest breaks, but pt able to continue with therapy. Will continue to progress towards LTGs.    Personal Factors and Comorbidities Comorbidity 3+    Comorbidities brain cancer, cva, headaches, stenosis, dizziness    Examination-Activity Limitations  Locomotion Level;Stairs;Transfers    Examination-Participation Restrictions Community Activity;Driving    Rehab Potential Good    PT Frequency 2x / week   1-2x a week   PT Duration 8 weeks    PT Treatment/Interventions ADLs/Self Care Home Management;Neuromuscular re-education;Balance training;Therapeutic exercise;Therapeutic activities;Stair training;Gait training;Patient/family education;Manual techniques;DME Instruction;Visual/perceptual remediation/compensation;Vestibular;Passive range of motion    PT Next Visit Plan Treat in private room with lights dimmed. continue to practice using compensatory saccades with head turns for sit <> stand, stand pivot and walking. and for head motions in standing. continue to practice with RW/rollator for safety with gait. Balance with decreased UE support in corner.    PT Home Exercise Plan [id number]    Consulted and Agree with Plan of Care Patient           Patient will benefit from skilled therapeutic intervention in order to improve the following deficits and  impairments:  Abnormal gait,Decreased range of motion,Impaired UE functional use,Decreased balance,Decreased strength,Postural dysfunction,Impaired flexibility,Dizziness,Decreased activity tolerance,Decreased coordination,Impaired sensation,Difficulty walking,Decreased knowledge of use of DME  Visit Diagnosis: Muscle weakness (generalized)  Unsteadiness on feet  Dizziness and giddiness     Problem List Patient Active Problem List   Diagnosis Date Noted  . Hyperlipidemia 01/11/2020  . 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 Goals of care, counseling/discussion 11/02/2019  . Abnormal brain MRI 12/07/2018  . Confusion 12/07/2018  . TIA (transient ischemic attack) 11/04/2018  . CVA (cerebral vascular accident) ([place]) 03/29/2018  . Essential hypertension 06/20/2016  . Suicidal ideation 09/20/2013  . MDD (major depressive disorder) 09/20/2013  . Menorrhagia 02/10/2013  . Pulmonary embolism ([place]) 09/09/2012  . History of pulmonary embolism 09/06/2012  . Acute respiratory failure with hypoxia ([place]) 02/04/2012  . Bilateral pulmonary embolism ([place]) 02/04/2012  . Hypokalemia 02/04/2012  . Hyponatremia 02/04/2012  . Elevation of cardiac enzymes 02/04/2012  . Family History of hypercoagulable state 02/04/2012  . History of squamous cell carcinoma of skin 12/17/2011  . History  of basal cell carcinoma 12/17/2011  . Depression 12/17/2011  . Iron deficiency anemia - severe 12/17/2011  . Melanoma ([place])     Arliss Journey, PT, DPT  03/05/2021, 2:39 PM  [facility] [address] [place], [place], [address] Phone: [phone]   Fax:  [phone]  Name: Kimberly Monroe MRN: [id number] Date of Birth: 09/11/1961

## 2021-03-05 NOTE — Patient Instructions (Signed)
  Coordination Activities  Perform the following activities for 10 minutes 2 times per day with right hand(s). Keep elbow or forearm on table for as many below exercises as able.    Flip cards 1 at a time as fast as you can.  Deal cards with your thumb (Hold deck in hand and push card off top with thumb).  Rotate card in hand (clockwise and counter-clockwise).  Pick up coins, buttons, marbles, dried beans/pasta of different sizes and place in container.  Pick up coins and stack. Do 3 stacks of 5  Practice writing

## 2021-03-05 NOTE — Therapy (Signed)
[facility] [address] [place], [place], [address] Phone: [phone]   Fax:  [phone]  Speech Language Pathology Treatment  Patient Details  Name: Kimberly Monroe MRN: [id number] Date of Birth: 1960-11-13 Referring Provider (SLP): Malachy Chamber, MD   Encounter Date: 03/05/2021   End of Session - 03/05/21 1314    Visit Number 5    Number of Visits 17    Date for SLP Re-Evaluation 04/24/21    Authorization Type UHC    Authorization Time Period 30 ST    SLP Start Time 1016    SLP Stop Time  1100    SLP Time Calculation (min) 44 min    Activity Tolerance Patient tolerated treatment well           Past Medical History:  Diagnosis Date  . Allergy    seasonal  . Anemia   . Anxiety   . 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 Basal cell cancer   . Cognitive changes   . Depression   . Goals of care, counseling/discussion 11/02/2019  . History of blood clotting disorder   . Hx of cerebral artery stenosis   . Hypertension   . Melanoma ([place]) 2009   insitu  . Menorrhagia 02/10/2013  . Migraine    history of/none in years  . Ovarian cancer ([place]) 1995   left ovary  . Pulmonary embolism ([place])   . Squamous cell carcinoma   . Stroke [facility])     Past Surgical History:  Procedure Laterality Date  . ABDOMINAL HYSTERECTOMY  03/06/2013  . BREAST DUCTAL SYSTEM EXCISION  2009   L intraductal papilloma  . CEREBRAL ANGIOGRAM  2020  . [place]   twins, singleton  . IR ANGIO INTRA EXTRACRAN SEL COM CAROTID INNOMINATE BILAT MOD SED  11/05/2018  . IR ANGIO VERTEBRAL SEL SUBCLAVIAN INNOMINATE UNI L MOD SED  11/05/2018  . IR ANGIO VERTEBRAL SEL VERTEBRAL UNI R MOD SED  11/05/2018  . IR US GUIDE VASC ACCESS RIGHT  11/05/2018  . laparoscopy with ovarian cystectomy  1994   ovarian torsion  . 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 MOHS SURGERY  2009   Dr Link Snuffer    There were no vitals filed for  this visit.   Subjective Assessment - 03/05/21 1018    Subjective "I don't  know    Currently in Pain? Yes    Pain Score --   no rating offered   Pain Location Shoulder    Pain Orientation Right    Pain Descriptors / Indicators Dull    Pain Type Chronic pain                 ADULT SLP TREATMENT - 03/05/21 1019      General Information   Behavior/Cognition Alert;Cooperative;Pleasant mood      Treatment Provided   Treatment provided Cognitive-Linquistic      Cognitive-Linquistic Treatment   Treatment focused on Patient/family/caregiver education    Skilled Treatment Pt has responded about her goal/s for communication that she would like to communicate with others but feels this is overwhelming. Mirella's conversation today demonstrated good splinter skills in anticipatory awareness and yet was tangential due to decr'd selective attention. ("I can't start 3-4 of these (cards) - that won't work for me.", "I can't just delete their name - I will want to make sure I have record of who I sent a card to becuase I'll  forget.") She states she has struggled with writing cards due to not being able to have a plan and there are more and more people she would like to thank. SLP assisted pt develop a list of people which she would like to write simple thank you cards. SLP had pt write in her notes app due to decr'd legibility.      Assessment / Recommendations / Plan   Plan Goals updated      Progression Toward Goals   Progression toward goals Progressing toward goals            SLP Education - 03/05/21 1314    Education Details need a visit summary (see pt instructions), pt's main deficit is attention    Person(s) Educated Patient    Methods Explanation;Handout    Comprehension Verbalized understanding;Need further instruction            SLP Short Term Goals - 03/05/21 1316      SLP SHORT TERM GOAL #1   Title Pt will use 2 memory and attention compensations to aid successful  completion of household tasks and other daily activities with occasional min A over 3 sessions.    Time 1    Period Weeks    Status On-going      SLP SHORT TERM GOAL #2   Title Pt will complete 2-3 step sequences related to household tasks for 4/5 opportunities with occassional min A over 3 sessions    Time 1    Period Weeks    Status On-going      SLP SHORT TERM GOAL #3   Title Pt will use word finding compensations in simple 10 minute conversations with rare min A over 2 sessions    Time 1    Period Weeks    Status On-going      SLP SHORT TERM GOAL #4   Title Pt will follow simple 4-5 step recipes for cooking at home with use of compensations given min A from family for 2/2 opportunities    Time 1    Period Weeks    Status On-going      SLP SHORT TERM GOAL #5   Title Pt will comprehend simple to mod complex medical information related to her care with use of clarifying questions and confirming statements over 2 sessions    Time 1    Period Weeks    Status On-going            SLP Long Term Goals - 03/05/21 1317      SLP LONG TERM GOAL #1   Title Pt will use 4 memory and attention compensations for successful completion of household tasks and other daily activities with rare min A from family over 2 sessions    Time 5    Period Weeks    Status On-going      SLP LONG TERM GOAL #2   Title Pt will successfully complete 2 household tasks of choice with use of compensations and occasional min A over 2 sessions    Time 5    Period Weeks    Status On-going      SLP LONG TERM GOAL #3   Title Pt will use word finding compensations in 15 min simple to mod complex conversations with rare min A over 2 sessions    Status Deferred   due to focus on cognitive compensations     SLP LONG TERM GOAL #4   Title Pt will comprehend mod complex to complex  medical information related to her care with use of clarifying questions and confirming statements over 2 sessions    Time 5     Period Weeks    Status On-going      SLP LONG TERM GOAL #5   Title Pt will report reduced frustration related to cognitive communication skills by last ST session    Time 5    Period Weeks    Status On-going            Plan - 03/05/21 1315    Clinical Impression Statement Raima presents for ST intervention to address changes in cognitive linguistic skills due to thalamic CVA and malignant neoplasm of brain. Pt would benefit from additional assistance to complete complex or high level tasks due to processing deficits. She would like a goal to be to use compensations for incr'd organization of daily tasks, beginning with writing people to receive thank you notes. Given significant impact on her functional abilities, SLP recommends skilled ST intervention to address cognitive communication to reduce patient frustration, decrease caregiver burden, and optimize QOL.    Speech Therapy Frequency 2x / week    Duration 8 weeks   or 17 total visits   Treatment/Interventions Compensatory strategies;Patient/family education;Functional tasks;Cueing hierarchy;Environmental controls;Cognitive reorganization;Multimodal communcation approach;SLP instruction and feedback;Internal/external aids;Compensatory techniques;Language facilitation    Potential to Achieve Goals Fair    Potential Considerations Medical prognosis;Severity of impairments    SLP Home Exercise Plan provided    Consulted and Agree with Plan of Care Patient           Patient will benefit from skilled therapeutic intervention in order to improve the following deficits and impairments:   Cognitive communication deficit    Problem List Patient Active Problem List   Diagnosis Date Noted  . Hyperlipidemia 01/11/2020  . 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 Goals of care, counseling/discussion 11/02/2019  . Abnormal brain MRI 12/07/2018  . Confusion 12/07/2018  . TIA (transient ischemic attack) 11/04/2018  . CVA  (cerebral vascular accident) ([place]) 03/29/2018  . Essential hypertension 06/20/2016  . Suicidal ideation 09/20/2013  . MDD (major depressive disorder) 09/20/2013  . Menorrhagia 02/10/2013  . Pulmonary embolism ([place]) 09/09/2012  . History of pulmonary embolism 09/06/2012  . Acute respiratory failure with hypoxia ([place]) 02/04/2012  . Bilateral pulmonary embolism ([place]) 02/04/2012  . Hypokalemia 02/04/2012  . Hyponatremia 02/04/2012  . Elevation of cardiac enzymes 02/04/2012  . Family History of hypercoagulable state 02/04/2012  . History of squamous cell carcinoma of skin 12/17/2011  . History  of basal cell carcinoma 12/17/2011  . Depression 12/17/2011  . Iron deficiency anemia - severe 12/17/2011  . Melanoma ([place])     [place] ,[place], [place]  03/05/2021, 1:19 PM  [facility] [address] [place], [place], [address] Phone: [phone]   Fax:  [phone]   Name: TEKESHA ALMGREN MRN: [id number] Date of Birth: 01/25/1961

## 2021-03-06 ENCOUNTER — Encounter: Payer: Self-pay | Admitting: Occupational Therapy

## 2021-03-07 ENCOUNTER — Ambulatory Visit: Payer: BC Managed Care – PPO | Admitting: Occupational Therapy

## 2021-03-07 ENCOUNTER — Ambulatory Visit: Payer: BC Managed Care – PPO

## 2021-03-11 DIAGNOSIS — F4323 Adjustment disorder with mixed anxiety and depressed mood: Secondary | ICD-10-CM | POA: Diagnosis not present

## 2021-03-11 DIAGNOSIS — R419 Unspecified symptoms and signs involving cognitive functions and awareness: Secondary | ICD-10-CM | POA: Diagnosis not present

## 2021-03-12 ENCOUNTER — Ambulatory Visit: Payer: BC Managed Care – PPO

## 2021-03-12 ENCOUNTER — Ambulatory Visit: Payer: BC Managed Care – PPO | Admitting: Physical Therapy

## 2021-03-12 ENCOUNTER — Ambulatory Visit: Payer: BC Managed Care – PPO | Attending: Specialist | Admitting: Occupational Therapy

## 2021-03-12 ENCOUNTER — Other Ambulatory Visit: Payer: Self-pay

## 2021-03-12 VITALS — BP 126/91 | HR 81

## 2021-03-12 DIAGNOSIS — M25511 Pain in right shoulder: Secondary | ICD-10-CM | POA: Diagnosis not present

## 2021-03-12 DIAGNOSIS — M25611 Stiffness of right shoulder, not elsewhere classified: Secondary | ICD-10-CM | POA: Diagnosis not present

## 2021-03-12 DIAGNOSIS — R41840 Attention and concentration deficit: Secondary | ICD-10-CM

## 2021-03-12 DIAGNOSIS — M6281 Muscle weakness (generalized): Secondary | ICD-10-CM | POA: Insufficient documentation

## 2021-03-12 NOTE — Patient Instructions (Signed)
  Safety considerations due to lack of sensation Rt hand/Rt side:   1) Test temperature of water with Lt hand/arm for showers, washing dishes, washing hands, etc  2) Look at Rt hand when using it! Use vision due to lack of sensation  3) Carry hot drinks in Lt hand, or use travel mug with lid  4) Carry heavy, breakable, and/or hot things in Lt hand   5) Consider devices like hand choppers or electric food processors for dicing, chopping OR purchasing a culinary cut resistant glove.   6) Get help when needed or if something is not safe   Memory Compensation Strategies  1. Use "WARM" strategy.  W= write it down  A= associate it  R= repeat it  M= make a mental note  2.   You can keep a Social worker.  Use a 3-ring notebook with sections for the following: calendar, important names and phone numbers,  medications, doctors' names/phone numbers, lists/reminders, and a section to journal what you did  each day.   3.    Use a calendar to write appointments down.  4.    Write yourself a schedule for the day.  This can be placed on the calendar or in a separate section of the Memory Notebook.  Keeping a  regular schedule can help memory.  5.    Use medication organizer with sections for each day or morning/evening pills.  You may need help loading it  6.    Keep a basket, or pegboard by the door.  Place items that you need to take out with you in the basket or on the pegboard.  You may also want to  include a message board for reminders.  7.    Use sticky notes.  Place sticky notes with reminders in a place where the task is performed.  For example: " turn off the  stove" placed by the stove, "lock the door" placed on the door at eye level, " take your medications" on  the bathroom mirror or by the place where you normally take your medications.  8.    Use alarms/timers.  Use while cooking to remind yourself to check on food or as a reminder to take your medicine, or as a  reminder to  make a call, or as a reminder to perform another task, etc.

## 2021-03-12 NOTE — Therapy (Signed)
[facility] [address] [place], [place], [address] Phone: [phone]   Fax:  [phone]  Physical Therapy Treatment-Arrived No Charge  Patient Details  Name: Kimberly Monroe MRN: [id number] Date of Birth: 08-Nov-1960 Referring Provider (PT): Malachy Chamber MD   Encounter Date: 03/12/2021   PT End of Session - 03/12/21 0937    Visit Number 10   arrived no charge   Number of Visits 24    Date for PT Re-Evaluation 05/13/21    Authorization Type Changed to BC/BS as of 03/06/21 - 20 visits combined for OT/PT, separate 81 for SLP    Authorization - Visit Number --    Authorization - Number of Visits 10   visit limit for PT as of new insurance on 03/06/21   PT Start Time 0928    Activity Tolerance Other (comment)   not feeling well   Behavior During Therapy --           Past Medical History:  Diagnosis Date  . Allergy    seasonal  . Anemia   . Anxiety   . 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 Basal cell cancer   . Cognitive changes   . Depression   . Goals of care, counseling/discussion 11/02/2019  . History of blood clotting disorder   . Hx of cerebral artery stenosis   . Hypertension   . Melanoma ([place]) 2009   insitu  . Menorrhagia 02/10/2013  . Migraine    history of/none in years  . Ovarian cancer ([place]) 1995   left ovary  . Pulmonary embolism ([place])   . Squamous cell carcinoma   . Stroke [facility])     Past Surgical History:  Procedure Laterality Date  . ABDOMINAL HYSTERECTOMY  03/06/2013  . BREAST DUCTAL SYSTEM EXCISION  2009   L intraductal papilloma  . CEREBRAL ANGIOGRAM  2020  . [place]   twins, singleton  . IR ANGIO INTRA EXTRACRAN SEL COM CAROTID INNOMINATE BILAT MOD SED  11/05/2018  . IR ANGIO VERTEBRAL SEL SUBCLAVIAN INNOMINATE UNI L MOD SED  11/05/2018  . IR ANGIO VERTEBRAL SEL VERTEBRAL UNI R MOD SED  11/05/2018  . IR US GUIDE VASC ACCESS RIGHT  11/05/2018  . laparoscopy  with ovarian cystectomy  1994   ovarian torsion  . 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 MOHS SURGERY  2009   Dr Link Snuffer    Vitals:   03/12/21 0938  BP: (!) 126/91  Pulse: 81  SpO2: 98%     Subjective Assessment - 03/12/21 0940    Subjective Not feeling well after OT. Lying down on her side in a dark room.    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                         Pt reports not feeling well after OT and is lying down on her side in dark treatment room. Assessed pt's vitals (see above). Called pt's daughter to make her aware of how pt is feeling and to see if she can pick pt up from therapy. Pt's daughter reports that she is able to  come pick her up now, but pt very adamant that daughter does not come and get her but would rather take transportation via [facility] (said this to daughter multiple times on the phone). Therapist called pt's oncologist office and spoke to representative on the phone and let pt know how she was feeling (head feeling jumbled, twitches in left hand, incr fatigue, and overall not feeling well). Representative to let pt's oncologist/nurse know and pt will be receiving a call today - relayed this information to pt. Pt called ub[employer] services via [place] to be driven home (daughter aware as will assist pt once she gets home) - and therapist wheeled pt out to the car via manual clinic w/c and made sure pt got safely into car.               PT Short Term Goals - 02/12/21 1138      PT SHORT TERM GOAL #1   Title Pt will be independent with HEP with assist/cues from daughter in order to improve compliance at home.    Baseline pt has not been performing HEP  consistently due to cognitive deficits - daughter not present with pt to review. ALL STGS DUE 03/12/21.    Time 4    Period Weeks    Status New    Target Date 03/12/21      PT SHORT TERM GOAL #2   Title Pt will undergo trial of RW vs. rollator to determine appropriate AD in the home and in the community in order to decr fall risk and improve safety.    Baseline uses SPC on 01/24/21    Time 4    Period Weeks    Status New      PT SHORT TERM GOAL #3   Title Pt will improve gait speed to at least 1.0 ft/sec with appropriate AD in order to demo decr fall risk.    Baseline .61 ft/sec with SPC on 01/30/21    Time 4    Period Weeks    Status New      PT SHORT TERM GOAL #4   Title Pt will demo compensatory saccades with supervision with sit <> stands and gait/standing with head motions in order to demo improved functional mobility.    Time 4    Period Weeks    Status New      PT SHORT TERM GOAL #5   Title Pt will undergo TUG with appropriate AD with LTG written as appropriate.    Time 4    Period Weeks    Status New             PT Long Term Goals - 02/12/21 1142      PT LONG TERM GOAL #1   Title TUG goal to be written as appropriate to decr fall risk with appropriate AD. ALL LTGS DUE 04/09/21.    Time 8    Period Weeks    Status New    Target Date 04/09/21      PT LONG TERM GOAL #2   Title Pt will ambulate with appropriate AD 23' with supervision around clinic gym in order to demo improved household mobility.    Baseline with SPC, needs min guard at times for safety, pt unable to tolerate clinic environment    Time 8    Period Weeks    Status New      PT LONG TERM GOAL #3   Title Pt will decr 5x sit <> stand to  40 seconds or less with BUE support to demo decr fall risk and improved functional BLE strength.    Baseline 62 seconds on 01/24/21    Time 8    Period Weeks    Status New      PT LONG TERM GOAL #4   Title Pt will rate positional sensitivities  on the MSQ to a 1  or less in order to demo improved motion sensitivity.    Time 8    Period Weeks    Status New      PT LONG TERM GOAL #5   Title Pt will improve gait speed to at least 1.0 ft/sec with appropriate AD in order to demo decr fall risk.    Baseline .26 ft/sec with SPC    Time 8    Period Weeks    Status New                  Patient will benefit from skilled therapeutic intervention in order to improve the following deficits and impairments:     Visit Diagnosis: Muscle weakness (generalized)     Problem List Patient Active Problem List   Diagnosis Date Noted  . Hyperlipidemia 01/11/2020  . 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 Goals of care, counseling/discussion 11/02/2019  . Abnormal brain MRI 12/07/2018  . Confusion 12/07/2018  . TIA (transient ischemic attack) 11/04/2018  . CVA (cerebral vascular accident) ([place]) 03/29/2018  . Essential hypertension 06/20/2016  . Suicidal ideation 09/20/2013  . MDD (major depressive disorder) 09/20/2013  . Menorrhagia 02/10/2013  . Pulmonary embolism ([place]) 09/09/2012  . History of pulmonary embolism 09/06/2012  . Acute respiratory failure with hypoxia ([place]) 02/04/2012  . Bilateral pulmonary embolism ([place]) 02/04/2012  . Hypokalemia 02/04/2012  . Hyponatremia 02/04/2012  . Elevation of cardiac enzymes 02/04/2012  . Family History of hypercoagulable state 02/04/2012  . History of squamous cell carcinoma of skin 12/17/2011  . History  of basal cell carcinoma 12/17/2011  . Depression 12/17/2011  . Iron deficiency anemia - severe 12/17/2011  . Melanoma ([place])     Arliss Journey, PT, DPT  03/12/2021, 9:51 AM  [facility] [address] [place], [place], [address] Phone: [phone]   Fax:  [phone]  Name: Kimberly Monroe MRN: [id number] Date of Birth: November 18, 1960

## 2021-03-12 NOTE — Therapy (Signed)
[facility] [address] [place], [place], [address] Phone: [phone]   Fax:  [phone]  Occupational Therapy Treatment  Patient Details  Name: Kimberly Monroe MRN: [id number] Date of Birth: Oct 09, 1960 Referring Provider (OT): Dr. Malachy Chamber   Encounter Date: 03/12/2021   OT End of Session - 03/12/21 0842    Visit Number 4    Number of Visits 17    Date for OT Re-Evaluation 04/14/21    Authorization Type Changed to BC/BS as of 03/06/21 - 20 visits combined for OT/PT, separate 20 for SLP    Authorization - Visit Number 1    Authorization - Number of Visits 10    OT Start Time 0830    OT Stop Time 0915    OT Time Calculation (min) 45 min    Activity Tolerance Patient tolerated treatment well    Behavior During Therapy [facility] for tasks assessed/performed           Past Medical History:  Diagnosis Date  . Allergy    seasonal  . Anemia   . Anxiety   . 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 Basal cell cancer   . Cognitive changes   . Depression   . Goals of care, counseling/discussion 11/02/2019  . History of blood clotting disorder   . Hx of cerebral artery stenosis   . Hypertension   . Melanoma ([place]) 2009   insitu  . Menorrhagia 02/10/2013  . Migraine    history of/none in years  . Ovarian cancer ([place]) 1995   left ovary  . Pulmonary embolism ([place])   . Squamous cell carcinoma   . Stroke [facility])     Past Surgical History:  Procedure Laterality Date  . ABDOMINAL HYSTERECTOMY  03/06/2013  . BREAST DUCTAL SYSTEM EXCISION  2009   L intraductal papilloma  . CEREBRAL ANGIOGRAM  2020  . [place]   twins, singleton  . IR ANGIO INTRA EXTRACRAN SEL COM CAROTID INNOMINATE BILAT MOD SED  11/05/2018  . IR ANGIO VERTEBRAL SEL SUBCLAVIAN INNOMINATE UNI L MOD SED  11/05/2018  . IR ANGIO VERTEBRAL SEL VERTEBRAL UNI R MOD SED  11/05/2018  . IR US GUIDE VASC ACCESS RIGHT  11/05/2018  . laparoscopy  with ovarian cystectomy  1994   ovarian torsion  . 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 MOHS SURGERY  2009   Dr Link Snuffer    There were no vitals filed for this visit.   Subjective Assessment - 03/12/21 0840    Subjective  I'm having a bad day and feel very out of it. I think coming 2x/wk is too much for me    Pertinent History diffuse astrocytoma (malignant neoplasm) diagnosed 09/2019. PMHx of basal and squamous cell carcinoma, melanoma, intraductal papilloma of breast, ovarian cancer, PE (on Xarelto),? CVA, Fe-deficient anemia, HTN, HLD, hx cerebral artery stenosis, depression/anxiety, and migraines    Limitations Fall risk, no driving    Patient Stated Goals Decrease family burden of care and increase compensatory strategies for ADLS    Currently in Pain? Yes    Pain Location Shoulder    Pain Orientation Right    Pain Descriptors / Indicators Sore;Dull    Pain Type Chronic pain    Pain Onset 1 to 4 weeks ago    Pain Frequency Intermittent    Aggravating Factors  malpositioning    Pain Relieving Factors taping  Discussed safety considerations due to lack of sensation Rt hand/Rt side and memory compensation strategies - see pt instructions. Reviewed/reinforced proper positioning when reaching RUE to help reduce pain. Videotaped proper application of kinesiotape to relax middle deltoid for family to apply. Also verbally reviewed shoulder HEP and pt reports no need to review coordination HEP.  Pt reports feeling off today and requested vitals be checked. BP = 122/89, O2 = 97%, HR = 85. Pt also reports she ate breakfast.  Per pt request, pt was placed on mat sidelying (pt was previously sitting in chair) until P.T. appointment. This therapist informed P.T. to recheck vitals and will most likely not stay for remaining appts today.                       OT Education - 03/12/21 0901    Education Details safety considerations, memory strategies    Person(s)  Educated Patient    Methods Explanation;Demonstration;Verbal cues;Handout    Comprehension Verbalized understanding;Returned demonstration;Verbal cues required            OT Short Term Goals - 03/12/21 0938      OT SHORT TERM GOAL #1   Title Independent with Rt shoulder HEP    Time 4    Period Weeks    Status Achieved      OT SHORT TERM GOAL #2   Title Independent with coordination and putty HEP for Rt hand    Time 4    Period Weeks    Status Achieved      OT SHORT TERM GOAL #3   Title Pt to verbalize understanding with external cues/compensations and potential A/E to increase safety/independence with ADLS due to cognitive deficits    Time 4    Period Weeks    Status On-going      OT SHORT TERM GOAL #4   Title Pt to verbalize understanding with safety considerations d/t decreased sensation Rt hand and to report overall less drops Rt hand    Time 4    Period Weeks    Status Achieved             OT Long Term Goals - 02/12/21 1358      OT LONG TERM GOAL #1   Title Pt/family to verbalize understanding with acquiring grab bars for shower    Time 8    Period Weeks    Status New      OT LONG TERM GOAL #2   Title Pt to consistently make own cold meals/sandwiches/microwaveable items I'ly and assist with prep work of dinner    Time 8    Period Weeks    Status New      OT LONG TERM GOAL #3   Title Pt to improve coordination Rt hand as evidenced by reducing speed on 9 hole peg test to 55 sec or less    Baseline 69 sec    Time 8    Period Weeks    Status New      OT LONG TERM GOAL #4   Title Pt to improve grip strength Rt hand to 30 lbs or greater to assist with opening items    Baseline 26 lbs    Time 8    Period Weeks    Status New      OT LONG TERM GOAL #5   Title Pt to use RUE to assist in brushing and washing hair    Baseline currenlty w/ LUE d/t limited Rt  shoulder ROM and pain    Time 8    Period Weeks    Status New                 Plan -  03/12/21 0939    Clinical Impression Statement Pt has met 3/4 STG's at this time. Pt limited by extreme fatigue and feeling ill today    OT Occupational Profile and History Detailed Assessment- Review of Records and additional review of physical, cognitive, psychosocial history related to current functional performance    Occupational performance deficits (Please refer to evaluation for details): ADL's;IADL's;Social Participation    Body Structure / Function / Physical Skills ADL;Strength;Dexterity;Pain;Proprioception;UE functional use;IADL;ROM;Sensation;Coordination;FMC    Cognitive Skills Problem Solve;Perception;Sequencing    Rehab Potential Good    Clinical Decision Making Several treatment options, min-mod task modification necessary    Comorbidities Affecting Occupational Performance: Presence of comorbidities impacting occupational performance    Comorbidities impacting occupational performance description: malignant brain cancer    OT Frequency 2x / week    OT Duration 8 weeks   plus eval (may reduce duration prn based on pts progress)   OT Treatment/Interventions Self-care/ADL training;DME and/or AE instruction;Moist Heat;Therapeutic activities;Coping strategies training;Cognitive remediation/compensation;Therapeutic exercise;Neuromuscular education;Passive range of motion;Functional Mobility Training;Visual/perceptual remediation/compensation;Patient/family education;Energy conservation;Manual Therapy    Plan Insurance has changed as of 03/06/21. Pt has requested to reduce down to 1x/wk d/t overwhelmed by 2x/wk. Pt still needs more O.T. sessions scheduled but left early from P.T. session due to feeling bad and was unable to get scheduled.    Consulted and Agree with Plan of Care Patient           Patient will benefit from skilled therapeutic intervention in order to improve the following deficits and impairments:   Body Structure / Function / Physical Skills:  ADL,Strength,Dexterity,Pain,Proprioception,UE functional use,IADL,ROM,Sensation,Coordination,FMC Cognitive Skills: Problem Solve,Perception,Sequencing     Visit Diagnosis: Right shoulder pain, unspecified chronicity  Attention and concentration deficit  Stiffness of right shoulder, not elsewhere classified    Problem List Patient Active Problem List   Diagnosis Date Noted  . Hyperlipidemia 01/11/2020  . 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 Goals of care, counseling/discussion 11/02/2019  . Abnormal brain MRI 12/07/2018  . Confusion 12/07/2018  . TIA (transient ischemic attack) 11/04/2018  . CVA (cerebral vascular accident) ([place]) 03/29/2018  . Essential hypertension 06/20/2016  . Suicidal ideation 09/20/2013  . MDD (major depressive disorder) 09/20/2013  . Menorrhagia 02/10/2013  . Pulmonary embolism ([place]) 09/09/2012  . History of pulmonary embolism 09/06/2012  . Acute respiratory failure with hypoxia ([place]) 02/04/2012  . Bilateral pulmonary embolism ([place]) 02/04/2012  . Hypokalemia 02/04/2012  . Hyponatremia 02/04/2012  . Elevation of cardiac enzymes 02/04/2012  . Family History of hypercoagulable state 02/04/2012  . History of squamous cell carcinoma of skin 12/17/2011  . History  of basal cell carcinoma 12/17/2011  . Depression 12/17/2011  . Iron deficiency anemia - severe 12/17/2011  . Melanoma ([place])     Carey Bullocks, OTR/L 03/12/2021, 9:42 AM  [facility] [address] [place], [place], [address] Phone: [phone]   Fax:  [phone]  Name: STEPHANYE FINNICUM MRN: [id number] Date of Birth: 1960-12-27

## 2021-03-14 ENCOUNTER — Ambulatory Visit: Payer: BC Managed Care – PPO | Admitting: Occupational Therapy

## 2021-03-14 ENCOUNTER — Ambulatory Visit: Payer: BC Managed Care – PPO

## 2021-03-14 ENCOUNTER — Ambulatory Visit: Payer: BC Managed Care – PPO | Admitting: Physical Therapy

## 2021-03-19 ENCOUNTER — Ambulatory Visit: Payer: 59 | Admitting: Physical Therapy

## 2021-03-19 ENCOUNTER — Ambulatory Visit: Payer: BC Managed Care – PPO

## 2021-03-21 ENCOUNTER — Ambulatory Visit: Payer: 59

## 2021-03-21 ENCOUNTER — Ambulatory Visit: Payer: 59 | Admitting: Physical Therapy

## 2021-05-01 ENCOUNTER — Encounter: Payer: Self-pay | Admitting: Hematology & Oncology

## 2021-05-01 ENCOUNTER — Other Ambulatory Visit: Payer: Self-pay

## 2021-05-01 ENCOUNTER — Inpatient Hospital Stay: Payer: BC Managed Care – PPO | Attending: Hematology & Oncology

## 2021-05-01 ENCOUNTER — Inpatient Hospital Stay (HOSPITAL_BASED_OUTPATIENT_CLINIC_OR_DEPARTMENT_OTHER): Payer: BC Managed Care – PPO | Admitting: Hematology & Oncology

## 2021-05-01 ENCOUNTER — Telehealth: Payer: Self-pay | Admitting: *Deleted

## 2021-05-01 VITALS — BP 146/85 | HR 66 | Temp 98.2°F | Resp 18 | Wt 229.0 lb

## 2021-05-01 DIAGNOSIS — C711 Malignant neoplasm of frontal lobe: Secondary | ICD-10-CM

## 2021-05-01 DIAGNOSIS — C713 Malignant neoplasm of parietal lobe: Secondary | ICD-10-CM | POA: Insufficient documentation

## 2021-05-01 LAB — CMP (CANCER CENTER ONLY)
ALT: 35 U/L (ref 0–44)
AST: 21 U/L (ref 15–41)
Albumin: 3.9 g/dL (ref 3.5–5.0)
Alkaline Phosphatase: 119 U/L (ref 38–126)
Anion gap: 7 (ref 5–15)
BUN: 17 mg/dL (ref 6–20)
CO2: 29 mmol/L (ref 22–32)
Calcium: 9.8 mg/dL (ref 8.9–10.3)
Chloride: 103 mmol/L (ref 98–111)
Creatinine: 0.88 mg/dL (ref 0.44–1.00)
GFR, Estimated: >60 mL/min (ref >60)
Glucose, Bld: 91 mg/dL (ref 70–99)
Potassium: 4 mmol/L (ref 3.5–5.1)
Sodium: 139 mmol/L (ref 135–145)
Total Bilirubin: 0.6 mg/dL (ref 0.3–1.2)
Total Protein: 6.6 g/dL (ref 6.5–8.1)

## 2021-05-01 LAB — CBC WITH DIFFERENTIAL (CANCER CENTER ONLY)
Abs Immature Granulocytes: 0.01 10*3/uL (ref 0.00–0.07)
Basophils Absolute: 0.1 10*3/uL (ref 0.0–0.1)
Basophils Relative: 1 %
Eosinophils Absolute: 0.1 10*3/uL (ref 0.0–0.5)
Eosinophils Relative: 2 %
HCT: 43.3 % (ref 36.0–46.0)
Hemoglobin: 14.6 g/dL (ref 12.0–15.0)
Immature Granulocytes: 0 %
Lymphocytes Relative: 29 %
Lymphs Abs: 2.2 10*3/uL (ref 0.7–4.0)
MCH: 30.9 pg (ref 26.0–34.0)
MCHC: 33.7 g/dL (ref 30.0–36.0)
MCV: 91.5 fL (ref 80.0–100.0)
Monocytes Absolute: 0.5 10*3/uL (ref 0.1–1.0)
Monocytes Relative: 7 %
Neutro Abs: 4.5 10*3/uL (ref 1.7–7.7)
Neutrophils Relative %: 61 %
Platelet Count: 272 10*3/uL (ref 150–400)
RBC: 4.73 MIL/uL (ref 3.87–5.11)
RDW: 13.3 % (ref 11.5–15.5)
WBC Count: 7.4 10*3/uL (ref 4.0–10.5)
nRBC: 0 % (ref 0.0–0.2)

## 2021-05-01 LAB — LACTATE DEHYDROGENASE: LDH: 120 U/L (ref 98–192)

## 2021-05-01 NOTE — Telephone Encounter (Signed)
Per 05/01/21 los - gave patient upcoming appointments - confirmed

## 2021-05-01 NOTE — Progress Notes (Signed)
Hematology and Oncology Follow Up Visit  Kimberly Monroe [id number] Feb 08, 1961 60 y.o. 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  Her birthday is coming up in a couple weeks.  Apparently, there will be a big family gathering in the [place].  Kimberly Monroe is looking forward to this.  1 daughter who is actually in [place], [place] will be coming over.  A couple other daughters who live in [place], [place] will be coming down.  Kimberly Monroe is going to switch her neuro-oncology care over to [facility].  Kimberly Monroe is getting assisted by a support group.  Kimberly Monroe is doing relatively okay.  Kimberly Monroe comes in with a cane.  Kimberly Monroe still has some cognitive issues.  I does not know if this is ever going to improve.  Kimberly Monroe does have some difficulty finding the right word to say.  Kimberly Monroe does not have a lot of concentration or focus.  Kimberly Monroe has had no bleeding.  Kimberly Monroe has had no nausea or vomiting.  There is been no change in bowel or bladder habits.  Kimberly Monroe is on Xarelto for the past history of thromboembolic disease.  There is been no leg swelling.  Overall, performance status is ECOG 2.   Medications:  Allergies as of 05/01/2021       Reactions   Lime Flavor [flavoring Agent] Anaphylaxis   Amoxicillin Other (See Comments)   Patient stated,"my mom told me to never take it. I don't even know what type of reaction."   Penicillins Other (See Comments)   Did it involve swelling of the face/tongue/throat, SOB, or low BP? Unknown Did it involve sudden or severe rash/hives, skin peeling, or any reaction on the inside of your mouth or nose? Unknown Did you need to seek medical attention at a  hospital or doctor's office? Unknown When did it last happen? Childhood allergy       If all above answers are "NO", may proceed with cephalosporin use. Patient stated,"I was told not to take it."        Medication List        Accurate as of May 01, 2021  9:16 AM. If you have any questions, ask your nurse or doctor.          amantadine 100 MG capsule Commonly known as: SYMMETREL Take 100 mg by mouth daily.   amantadine 100 MG capsule Commonly known as: SYMMETREL Take 1 capsule by mouth daily.   atorvastatin 40 MG tablet Commonly known as: LIPITOR Take 1 tablet (40 mg total) by mouth daily.   cyanocobalamin 1000 MCG tablet Take by mouth daily.   lisinopril 10 MG tablet Commonly known as: ZESTRIL TAKE 1 TABLET (10 MG TOTAL) BY MOUTH DAILY. PLEASE SCHEDULE FOLLOW UP FOR REFILLS.   LORazepam 1 MG tablet Commonly known as: ATIVAN Take 1 mg by mouth as needed.   modafinil 200 MG tablet Commonly known as: PROVIGIL Take 200 mg by mouth daily.   sertraline 50 MG tablet Commonly known as: ZOLOFT Take 50 mg by mouth daily.   Vitamin D (Ergocalciferol) 1.25 MG (50000 UNIT) Caps capsule Commonly known as: DRISDOL TAKE 1  CAPSULE BY MOUTH ONE TIME PER WEEK   Xarelto 20 MG Tabs tablet Generic drug: rivaroxaban TAKE 1 TABLET BY MOUTH DAILY WITH SUPPER.        Allergies:  Allergies  Allergen Reactions   Lime Flavor [Flavoring Agent] Anaphylaxis   Amoxicillin Other (See Comments)    Patient stated,"my mom told me to never take it. I don't even know what type of reaction."   Penicillins Other (See Comments)    Did it involve swelling of the face/tongue/throat, SOB, or low BP? Unknown Did it involve sudden or severe rash/hives, skin peeling, or any reaction on the inside of your mouth or nose? Unknown Did you need to seek medical attention at a hospital or doctor's office? Unknown When did it last happen? Childhood allergy       If all above answers are "NO", may  proceed with cephalosporin use.   Patient stated,"I was told not to take it."     Past Medical History, Surgical history, Social history, and Family History were reviewed and updated.  Review of Systems: Review of Systems  Constitutional: Negative.   Eyes: Negative.   Respiratory: Negative.    Cardiovascular: Negative.   Gastrointestinal: Negative.   Genitourinary: Negative.   Musculoskeletal: Negative.   Skin: Negative.   Neurological: Negative.   Endo/Heme/Allergies: Negative.   Psychiatric/Behavioral: Negative.      Physical Exam:  weight is 229 lb (103.9 kg). Her oral temperature is 98.2 F (36.8 C). Her blood pressure is 146/85 (abnormal) and her pulse is 66. Her respiration is 18 and oxygen saturation is 95%.   Wt Readings from Last 3 Encounters:  05/01/21 229 lb (103.9 kg)  12/05/20 227 lb 12.8 oz (103.3 kg)  11/23/20 186 lb 12.8 oz (84.7 kg)    Physical Exam Vitals reviewed.  HENT:     Head: Normocephalic and atraumatic.     Comments: On examination of her cranium, Kimberly Monroe has a healing craniectomy scar in the left parietal area.  This is healing nicely.    Ears:     Comments: Kimberly Monroe has a dressing on the left earlobe.  There is no adenopathy in the neck. Eyes:     Pupils: Pupils are equal, round, and reactive to light.  Neck:     Comments: Kimberly Monroe has a dressing in the left supraclavicular region where the skin graft was taken. Cardiovascular:     Rate and Rhythm: Normal rate and regular rhythm.     Heart sounds: Normal heart sounds.  Pulmonary:     Effort: Pulmonary effort is normal.     Breath sounds: Normal breath sounds.  Abdominal:     General: Bowel sounds are normal.     Palpations: Abdomen is soft.  Musculoskeletal:        General: No tenderness or deformity. Normal range of motion.     Cervical back: Normal range of motion.  Lymphadenopathy:     Cervical: No cervical adenopathy.  Skin:    General: Skin is warm and dry.     Findings: No erythema or  rash.  Neurological:     Mental Status: Kimberly Monroe is alert.     Comments: Neurological exam shows some decrease in her cognitive function.  Kimberly Monroe has quite a while to figure out what to say.  Kimberly Monroe has to talk slowly.  Kimberly Monroe is okay had word finding but again it does take a while.  Kimberly Monroe has some slight symmetrical decreased strength in her legs.  Her cranial nerves seem  to be intact.  Psychiatric:     Comments: Kimberly Monroe has little bit of a flat affect.     Lab Results  Component Value Date   WBC 7.4 05/01/2021   HGB 14.6 05/01/2021   HCT 43.3 05/01/2021   MCV 91.5 05/01/2021   PLT 272 05/01/2021   Lab Results  Component Value Date   FERRITIN 105 07/26/2020   IRON 127 07/26/2020   TIBC 291 07/26/2020   UIBC 164 07/26/2020   IRONPCTSAT 44 07/26/2020   Lab Results  Component Value Date   RETICCTPCT 1.5 09/06/2012   RBC 4.73 05/01/2021   RETICCTABS 67.7 09/06/2012   No results found for: [facility] Lab Results  Component Value Date   IGGSERUM 840 12/05/2020   IGA 294 12/05/2020   IGMSERUM 70 12/05/2020   No results found for: Odetta Pink, SPEI   Chemistry      Component Value Date/Time   NA 139 05/01/2021 0819   NA 141 07/23/2016 0956   K 4.0 05/01/2021 0819   K 4.2 07/23/2016 0956   CL 103 05/01/2021 0819   CO2 29 05/01/2021 0819   CO2 25 07/23/2016 0956   BUN 17 05/01/2021 0819   BUN 14.6 07/23/2016 0956   CREATININE 0.88 05/01/2021 0819   CREATININE 0.7 07/23/2016 0956      Component Value Date/Time   CALCIUM 9.8 05/01/2021 0819   CALCIUM 9.3 07/23/2016 0956   ALKPHOS 119 05/01/2021 0819   ALKPHOS 97 07/23/2016 0956   AST 21 05/01/2021 0819   AST 25 07/23/2016 0956   ALT 35 05/01/2021 0819   ALT 27 07/23/2016 0956   BILITOT 0.6 05/01/2021 0819   BILITOT 0.30 07/23/2016 0956       Impression and Plan: Kimberly Monroe is a very pleasant 60 yo caucasian female with history of idiopathic PE which  has since resolved.   Kimberly Monroe was followed at [facility] for the astrocytoma.  Again, Kimberly Monroe is not going to go to [place].  Not sure when the transition will be made.  Kimberly Monroe apparently got have a MRI done [facility] in August.  From my point of view, I do not see any evidence of recurrence of the glioblastoma.  I think we will still get her back in 3 months.  It is always fun seeing her.  I hope that Kimberly Monroe has a wonderful birthday in the [place].    Volanda Napoleon, MD 7/27/20229:16 AM

## 2021-05-27 DIAGNOSIS — I10 Essential (primary) hypertension: Secondary | ICD-10-CM | POA: Diagnosis not present

## 2021-05-27 DIAGNOSIS — R479 Unspecified speech disturbances: Secondary | ICD-10-CM | POA: Diagnosis not present

## 2021-05-27 DIAGNOSIS — Z800 Family history of malignant neoplasm of digestive organs: Secondary | ICD-10-CM | POA: Diagnosis not present

## 2021-05-27 DIAGNOSIS — Z6839 Body mass index (BMI) 39.0-39.9, adult: Secondary | ICD-10-CM | POA: Diagnosis not present

## 2021-05-27 DIAGNOSIS — R269 Unspecified abnormalities of gait and mobility: Secondary | ICD-10-CM | POA: Diagnosis not present

## 2021-05-27 DIAGNOSIS — Z880 Allergy status to penicillin: Secondary | ICD-10-CM | POA: Diagnosis not present

## 2021-05-27 DIAGNOSIS — Z808 Family history of malignant neoplasm of other organs or systems: Secondary | ICD-10-CM | POA: Diagnosis not present

## 2021-05-27 DIAGNOSIS — Z803 Family history of malignant neoplasm of breast: Secondary | ICD-10-CM | POA: Diagnosis not present

## 2021-05-27 DIAGNOSIS — I6501 Occlusion and stenosis of right vertebral artery: Secondary | ICD-10-CM | POA: Diagnosis not present

## 2021-05-27 DIAGNOSIS — Z923 Personal history of irradiation: Secondary | ICD-10-CM | POA: Diagnosis not present

## 2021-05-27 DIAGNOSIS — Z7901 Long term (current) use of anticoagulants: Secondary | ICD-10-CM | POA: Diagnosis not present

## 2021-05-27 DIAGNOSIS — C711 Malignant neoplasm of frontal lobe: Secondary | ICD-10-CM | POA: Diagnosis not present

## 2021-05-27 DIAGNOSIS — H538 Other visual disturbances: Secondary | ICD-10-CM | POA: Diagnosis not present

## 2021-05-27 DIAGNOSIS — R251 Tremor, unspecified: Secondary | ICD-10-CM | POA: Diagnosis not present

## 2021-05-27 DIAGNOSIS — Z9221 Personal history of antineoplastic chemotherapy: Secondary | ICD-10-CM | POA: Diagnosis not present

## 2021-05-27 DIAGNOSIS — C719 Malignant neoplasm of brain, unspecified: Secondary | ICD-10-CM | POA: Diagnosis not present

## 2021-05-27 DIAGNOSIS — R42 Dizziness and giddiness: Secondary | ICD-10-CM | POA: Diagnosis not present

## 2021-05-27 DIAGNOSIS — E785 Hyperlipidemia, unspecified: Secondary | ICD-10-CM | POA: Diagnosis not present

## 2021-06-05 ENCOUNTER — Other Ambulatory Visit: Payer: Self-pay | Admitting: Hematology & Oncology

## 2021-06-05 DIAGNOSIS — E559 Vitamin D deficiency, unspecified: Secondary | ICD-10-CM

## 2021-08-07 ENCOUNTER — Encounter: Payer: Self-pay | Admitting: Hematology & Oncology

## 2021-08-08 ENCOUNTER — Inpatient Hospital Stay: Payer: BC Managed Care – PPO

## 2021-08-08 ENCOUNTER — Inpatient Hospital Stay: Payer: BC Managed Care – PPO | Admitting: Hematology & Oncology

## 2021-08-19 ENCOUNTER — Other Ambulatory Visit: Payer: Self-pay | Admitting: Hematology & Oncology

## 2021-08-19 DIAGNOSIS — Z86711 Personal history of pulmonary embolism: Secondary | ICD-10-CM

## 2021-08-19 DIAGNOSIS — I639 Cerebral infarction, unspecified: Secondary | ICD-10-CM

## 2021-08-26 DIAGNOSIS — R4189 Other symptoms and signs involving cognitive functions and awareness: Secondary | ICD-10-CM | POA: Diagnosis not present

## 2021-08-26 DIAGNOSIS — E785 Hyperlipidemia, unspecified: Secondary | ICD-10-CM | POA: Diagnosis not present

## 2021-08-26 DIAGNOSIS — C719 Malignant neoplasm of brain, unspecified: Secondary | ICD-10-CM | POA: Diagnosis not present

## 2021-08-26 DIAGNOSIS — Z803 Family history of malignant neoplasm of breast: Secondary | ICD-10-CM | POA: Diagnosis not present

## 2021-08-26 DIAGNOSIS — Z808 Family history of malignant neoplasm of other organs or systems: Secondary | ICD-10-CM | POA: Diagnosis not present

## 2021-08-26 DIAGNOSIS — Z880 Allergy status to penicillin: Secondary | ICD-10-CM | POA: Diagnosis not present

## 2021-08-26 DIAGNOSIS — C711 Malignant neoplasm of frontal lobe: Secondary | ICD-10-CM | POA: Diagnosis not present

## 2021-08-26 DIAGNOSIS — Z923 Personal history of irradiation: Secondary | ICD-10-CM | POA: Diagnosis not present

## 2021-08-26 DIAGNOSIS — Z7901 Long term (current) use of anticoagulants: Secondary | ICD-10-CM | POA: Diagnosis not present

## 2021-08-26 DIAGNOSIS — Z800 Family history of malignant neoplasm of digestive organs: Secondary | ICD-10-CM | POA: Diagnosis not present

## 2021-08-26 DIAGNOSIS — Z79899 Other long term (current) drug therapy: Secondary | ICD-10-CM | POA: Diagnosis not present

## 2021-08-26 DIAGNOSIS — I10 Essential (primary) hypertension: Secondary | ICD-10-CM | POA: Diagnosis not present

## 2021-10-31 ENCOUNTER — Other Ambulatory Visit: Payer: Self-pay | Admitting: Family Medicine

## 2021-10-31 DIAGNOSIS — E785 Hyperlipidemia, unspecified: Secondary | ICD-10-CM

## 2021-11-09 ENCOUNTER — Other Ambulatory Visit: Payer: Self-pay | Admitting: Family Medicine

## 2021-11-25 DIAGNOSIS — Z9889 Other specified postprocedural states: Secondary | ICD-10-CM | POA: Diagnosis not present

## 2021-11-25 DIAGNOSIS — R4189 Other symptoms and signs involving cognitive functions and awareness: Secondary | ICD-10-CM | POA: Diagnosis not present

## 2021-11-25 DIAGNOSIS — I10 Essential (primary) hypertension: Secondary | ICD-10-CM | POA: Diagnosis not present

## 2021-11-25 DIAGNOSIS — R29898 Other symptoms and signs involving the musculoskeletal system: Secondary | ICD-10-CM | POA: Diagnosis not present

## 2021-11-25 DIAGNOSIS — Z85841 Personal history of malignant neoplasm of brain: Secondary | ICD-10-CM | POA: Diagnosis not present

## 2021-11-25 DIAGNOSIS — C719 Malignant neoplasm of brain, unspecified: Secondary | ICD-10-CM | POA: Diagnosis not present

## 2021-11-25 DIAGNOSIS — F0390 Unspecified dementia without behavioral disturbance: Secondary | ICD-10-CM | POA: Diagnosis not present

## 2021-11-25 DIAGNOSIS — Z923 Personal history of irradiation: Secondary | ICD-10-CM | POA: Diagnosis not present

## 2021-11-25 DIAGNOSIS — Z79899 Other long term (current) drug therapy: Secondary | ICD-10-CM | POA: Diagnosis not present

## 2021-11-25 DIAGNOSIS — Z08 Encounter for follow-up examination after completed treatment for malignant neoplasm: Secondary | ICD-10-CM | POA: Diagnosis not present

## 2021-11-30 ENCOUNTER — Other Ambulatory Visit: Payer: Self-pay | Admitting: Family Medicine

## 2021-11-30 DIAGNOSIS — E785 Hyperlipidemia, unspecified: Secondary | ICD-10-CM

## 2021-12-27 ENCOUNTER — Other Ambulatory Visit: Payer: Self-pay

## 2021-12-27 ENCOUNTER — Other Ambulatory Visit: Payer: Self-pay | Admitting: Family Medicine

## 2021-12-27 ENCOUNTER — Encounter: Payer: Self-pay | Admitting: Family Medicine

## 2021-12-27 DIAGNOSIS — E785 Hyperlipidemia, unspecified: Secondary | ICD-10-CM

## 2021-12-27 MED ORDER — ATORVASTATIN CALCIUM 40 MG PO TABS: 40.0000 mg | ORAL_TABLET | Freq: Every day | ORAL | 0 refills | Status: DC

## 2022-01-03 ENCOUNTER — Encounter: Payer: Self-pay | Admitting: Family Medicine

## 2022-01-03 ENCOUNTER — Other Ambulatory Visit: Payer: Self-pay | Admitting: Hematology & Oncology

## 2022-01-03 ENCOUNTER — Ambulatory Visit: Payer: BC Managed Care – PPO | Admitting: Family Medicine

## 2022-01-03 VITALS — BP 120/88 | HR 61 | Temp 98.1°F | Ht 64.0 in | Wt 229.0 lb

## 2022-01-03 DIAGNOSIS — E785 Hyperlipidemia, unspecified: Secondary | ICD-10-CM | POA: Diagnosis not present

## 2022-01-03 DIAGNOSIS — I10 Essential (primary) hypertension: Secondary | ICD-10-CM | POA: Diagnosis not present

## 2022-01-03 DIAGNOSIS — E559 Vitamin D deficiency, unspecified: Secondary | ICD-10-CM

## 2022-01-03 LAB — LIPID PANEL
Cholesterol: 133 mg/dL (ref 0–200)
HDL: 44.4 mg/dL (ref >39.00)
LDL Cholesterol: 71 mg/dL (ref 0–99)
NonHDL: 88.56
Total CHOL/HDL Ratio: 3
Triglycerides: 90 mg/dL (ref 0.0–149.0)
VLDL: 18 mg/dL (ref 0.0–40.0)

## 2022-01-03 MED ORDER — ATORVASTATIN CALCIUM 40 MG PO TABS: 40.0000 mg | ORAL_TABLET | Freq: Every day | ORAL | 3 refills | Status: DC

## 2022-01-03 NOTE — Progress Notes (Signed)
? ?Established Patient Office Visit ? ?Subjective:  ?Patient ID: Kimberly Monroe, female    DOB: [birth year]  Age: 61 y.o. MRN: [id number] ? ?CC:  ?Chief Complaint  ?Patient presents with  ? Medication Refill  ? ? ?HPI ?Kimberly Monroe presents for medical follow-up.  She has history of pulmonary embolism, hypertension, astrocytoma frontal lobe, remote history of melanoma, hyperlipidemia.  Doing reasonly well.  She needs refills of her atorvastatin.  Also needs follow-up lipid panel.  She gets CBC and CMP through oncology twice yearly.  Denies any recent headaches.  Tolerating lisinopril and Lipitor without difficulties.  No recent dizziness.  No chest pains.  No falls. ? ?Past Medical History:  ?Diagnosis Date  ? Allergy   ? seasonal  ? Anemia   ? Anxiety   ? Astrocytoma of frontal lobe ([place]) 11/02/2019  ? Basal cell cancer   ? Cognitive changes   ? Depression   ? Goals of care, counseling/discussion 11/02/2019  ? History of blood clotting disorder   ? Hx of cerebral artery stenosis   ? Hypertension   ? Melanoma ([place]) 2009  ? insitu  ? Menorrhagia 02/10/2013  ? Migraine   ? history of/none in years  ? Ovarian cancer ([place]) 1995  ? left ovary  ? Pulmonary embolism ([place])   ? Squamous cell carcinoma   ? Stroke [facility])   ? ? ?Past Surgical History:  ?Procedure Laterality Date  ? ABDOMINAL HYSTERECTOMY  03/06/2013  ? [place] EXCISION  2009  ? L intraductal papilloma  ? CEREBRAL ANGIOGRAM  2020  ? CESAREAN SECTION  1996, 2000  ? twins, singleton  ? IR ANGIO INTRA EXTRACRAN SEL COM CAROTID INNOMINATE BILAT MOD SED  11/05/2018  ? IR ANGIO VERTEBRAL SEL SUBCLAVIAN INNOMINATE UNI L MOD SED  11/05/2018  ? IR ANGIO VERTEBRAL SEL VERTEBRAL UNI R MOD SED  11/05/2018  ? IR US GUIDE VASC ACCESS RIGHT  11/05/2018  ? laparoscopy with ovarian cystectomy  1994  ? ovarian torsion  ? LAPAROTOMY  1995  ? ovarian thecoma  ? MOHS SURGERY  2009  ? Dr Link Snuffer  ? ? ?Family History  ?Problem Relation Age of Onset  ? Cancer Father 85  ?      colon cancer  ? Colon cancer Father   ? Breast cancer Mother 15  ? Cancer Paternal Uncle   ?     prostate  ? Stroke Sister 75  ? ? ?Social History  ? ?Socioeconomic History  ? Marital status: Married  ?  Spouse name: Not on file  ? Number of children: 3  ? Years of education: college  ? Highest education level: Not on file  ?Occupational History  ? Occupation: physical therapist  ?Tobacco Use  ? Smoking status: Never  ? Smokeless tobacco: Never  ? Tobacco comments:  ?  never used cigarettes  ?Vaping Use  ? Vaping Use: Never used  ?Substance and Sexual Activity  ? Alcohol use: Yes  ?  Alcohol/week: 0.0 standard drinks  ?  Comment: wine-occassionally  ? Drug use: No  ? Sexual activity: Not on file  ?Other Topics Concern  ? Not on file  ?Social History Narrative  ? Lives at home with husband and family.  ? Right-handed.  ? Caffeine use:  2-3 cups some days.  ? ?Social Determinants of Health  ? ?Financial Resource Strain: Not on file  ?Food Insecurity: Not on file  ?Transportation Needs: Not on file  ?Physical Activity: Not  on file  ?Stress: Not on file  ?Social Connections: Not on file  ?Intimate Partner Violence: Not on file  ? ? ?Outpatient Medications Prior to Visit  ?Medication Sig Dispense Refill  ? amantadine (SYMMETREL) 100 MG capsule Take 100 mg by mouth daily.    ? amantadine (SYMMETREL) 100 MG capsule Take 1 capsule by mouth daily.    ? cyanocobalamin 1000 MCG tablet Take by mouth daily.     ? lisinopril (ZESTRIL) 10 MG tablet TAKE 1 TABLET (10 MG TOTAL) BY MOUTH DAILY. PLEASE SCHEDULE FOLLOW UP FOR REFILLS. 90 tablet 3  ? LORazepam (ATIVAN) 1 MG tablet Take 1 mg by mouth as needed.    ? modafinil (PROVIGIL) 200 MG tablet Take 200 mg by mouth daily.    ? Vitamin D, Ergocalciferol, (DRISDOL) 1.25 MG (50000 UNIT) CAPS capsule TAKE 1 CAPSULE BY MOUTH ONE TIME PER WEEK 12 capsule 2  ? XARELTO 20 MG TABS tablet TAKE 1 TABLET BY MOUTH DAILY WITH SUPPER 90 tablet 1  ? atorvastatin (LIPITOR) 40 MG tablet Take 1  tablet (40 mg total) by mouth daily. 30 tablet 0  ? sertraline (ZOLOFT) 50 MG tablet Take 50 mg by mouth daily.    ? ?No facility-administered medications prior to visit.  ? ? ?Allergies  ?Allergen Reactions  ? Lime Flavor [Flavoring Agent] Anaphylaxis  ? Amoxicillin Other (See Comments)  ?  Patient stated,"my mom told me to never take it. I don't even know what type of reaction."  ? Penicillins Other (See Comments)  ?  Did it involve swelling of the face/tongue/throat, SOB, or low BP? Unknown ?Did it involve sudden or severe rash/hives, skin peeling, or any reaction on the inside of your mouth or nose? Unknown ?Did you need to seek medical attention at a hospital or doctor's office? Unknown ?When did it last happen? Childhood allergy       ?If all above answers are ?NO?, may proceed with cephalosporin use. ? ? ?Patient stated,"I was told not to take it." ?  ? ? ?ROS ?Review of Systems  ?Constitutional:  Negative for fatigue.  ?Eyes:  Negative for visual disturbance.  ?Respiratory:  Negative for cough, chest tightness, shortness of breath and wheezing.   ?Cardiovascular:  Negative for chest pain, palpitations and leg swelling.  ?Neurological:  Negative for dizziness, seizures, syncope, weakness, light-headedness and headaches.  ? ?  ?Objective:  ?  ?Physical Exam ?Constitutional:   ?   Appearance: She is well-developed.  ?Eyes:  ?   Pupils: Pupils are equal, round, and reactive to light.  ?Neck:  ?   Thyroid: No thyromegaly.  ?   Vascular: No JVD.  ?Cardiovascular:  ?   Rate and Rhythm: Normal rate and regular rhythm.  ?   Heart sounds:  ?  No gallop.  ?Pulmonary:  ?   Effort: Pulmonary effort is normal. No respiratory distress.  ?   Breath sounds: Normal breath sounds. No wheezing or rales.  ?Musculoskeletal:  ?   Cervical back: Neck supple.  ?Neurological:  ?   Mental Status: She is alert.  ? ? ?BP 120/88 (BP Location: Left Arm, Patient Position: Sitting, Cuff Size: Normal)   Pulse 61   Temp 98.1 ?F (36.7 ?C)  (Oral)   Ht _0  (1.626 m)   Wt 229 lb (103.9 kg)   LMP 01/24/2013   SpO2 96%   BMI 39.31 kg/m?  ?Wt Readings from Last 3 Encounters:  ?01/03/22 229 lb (103.9 kg)  ?05/01/21 229  lb (103.9 kg)  ?12/05/20 227 lb 12.8 oz (103.3 kg)  ? ? ? ?Health Maintenance Due  ?Topic Date Due  ? Hepatitis C Screening  Never done  ? TETANUS/TDAP  Never done  ? Zoster Vaccines- Shingrix (1 of 2) Never done  ? MAMMOGRAM  11/19/2013  ? PAP SMEAR-Modifier  03/20/2015  ? COLONOSCOPY (Pts 45-63yr Insurance coverage will need to be confirmed)  01/28/2017  ? COVID-19 Vaccine (3 - Pfizer risk series) 02/03/2021  ? ? ?There are no preventive care reminders to display for this patient. ? ?Lab Results  ?Component Value Date  ? TSH 2.28 04/15/2019  ? ?Lab Results  ?Component Value Date  ? WBC 7.4 05/01/2021  ? HGB 14.6 05/01/2021  ? HCT 43.3 05/01/2021  ? MCV 91.5 05/01/2021  ? PLT 272 05/01/2021  ? ?Lab Results  ?Component Value Date  ? NA 139 05/01/2021  ? K 4.0 05/01/2021  ? CHLORIDE 107 07/23/2016  ? CO2 29 05/01/2021  ? GLUCOSE 91 05/01/2021  ? BUN 17 05/01/2021  ? CREATININE 0.88 05/01/2021  ? BILITOT 0.6 05/01/2021  ? ALKPHOS 119 05/01/2021  ? AST 21 05/01/2021  ? ALT 35 05/01/2021  ? PROT 6.6 05/01/2021  ? ALBUMIN 3.9 05/01/2021  ? CALCIUM 9.8 05/01/2021  ? ANIONGAP 7 05/01/2021  ? EGFR >90 07/23/2016  ? GFR 72.46 01/11/2020  ? ?Lab Results  ?Component Value Date  ? CHOL 136 11/23/2020  ? ?Lab Results  ?Component Value Date  ? HDL 45.10 11/23/2020  ? ?Lab Results  ?Component Value Date  ? LEttrick73 11/23/2020  ? ?Lab Results  ?Component Value Date  ? TRIG 92.0 11/23/2020  ? ?Lab Results  ?Component Value Date  ? CHOLHDL 3 11/23/2020  ? ?Lab Results  ?Component Value Date  ? HGBA1C 6.0 (H) 11/05/2018  ? ? ?  ?Assessment & Plan:  ? ?#1 hyperlipidemia.  Patient on atorvastatin 40 mg daily.  Recheck lipid panel today.  Refilled Lipitor for 1 year ? ?#2 hypertension.  Systolic well controlled today.  Diastolic a little high.  Continue  monitoring.  Continue lisinopril 10 mg daily. ? ? ?Meds ordered this encounter  ?Medications  ? atorvastatin (LIPITOR) 40 MG tablet  ?  Sig: Take 1 tablet (40 mg total) by mouth daily.  ?  Dispense:  90 tablet  ?  Refi

## 2022-01-21 ENCOUNTER — Other Ambulatory Visit: Payer: Self-pay | Admitting: Hematology & Oncology

## 2022-01-21 DIAGNOSIS — Z86711 Personal history of pulmonary embolism: Secondary | ICD-10-CM

## 2022-01-21 DIAGNOSIS — I639 Cerebral infarction, unspecified: Secondary | ICD-10-CM

## 2022-03-24 DIAGNOSIS — Z6839 Body mass index (BMI) 39.0-39.9, adult: Secondary | ICD-10-CM | POA: Diagnosis not present

## 2022-03-24 DIAGNOSIS — R29810 Facial weakness: Secondary | ICD-10-CM | POA: Diagnosis not present

## 2022-03-24 DIAGNOSIS — R479 Unspecified speech disturbances: Secondary | ICD-10-CM | POA: Diagnosis not present

## 2022-03-24 DIAGNOSIS — C711 Malignant neoplasm of frontal lobe: Secondary | ICD-10-CM | POA: Diagnosis not present

## 2022-03-24 DIAGNOSIS — I10 Essential (primary) hypertension: Secondary | ICD-10-CM | POA: Diagnosis not present

## 2022-03-24 DIAGNOSIS — I6501 Occlusion and stenosis of right vertebral artery: Secondary | ICD-10-CM | POA: Diagnosis not present

## 2022-03-24 DIAGNOSIS — R531 Weakness: Secondary | ICD-10-CM | POA: Diagnosis not present

## 2022-03-24 DIAGNOSIS — Z923 Personal history of irradiation: Secondary | ICD-10-CM | POA: Diagnosis not present

## 2022-03-24 DIAGNOSIS — H538 Other visual disturbances: Secondary | ICD-10-CM | POA: Diagnosis not present

## 2022-03-24 DIAGNOSIS — C719 Malignant neoplasm of brain, unspecified: Secondary | ICD-10-CM | POA: Diagnosis not present

## 2022-03-24 DIAGNOSIS — Z79899 Other long term (current) drug therapy: Secondary | ICD-10-CM | POA: Diagnosis not present

## 2022-03-24 DIAGNOSIS — Z7901 Long term (current) use of anticoagulants: Secondary | ICD-10-CM | POA: Diagnosis not present

## 2022-03-24 DIAGNOSIS — E785 Hyperlipidemia, unspecified: Secondary | ICD-10-CM | POA: Diagnosis not present

## 2022-03-24 DIAGNOSIS — Z880 Allergy status to penicillin: Secondary | ICD-10-CM | POA: Diagnosis not present

## 2022-04-09 DIAGNOSIS — R419 Unspecified symptoms and signs involving cognitive functions and awareness: Secondary | ICD-10-CM | POA: Diagnosis not present

## 2022-04-09 DIAGNOSIS — C711 Malignant neoplasm of frontal lobe: Secondary | ICD-10-CM | POA: Diagnosis not present

## 2022-07-19 ENCOUNTER — Other Ambulatory Visit: Payer: Self-pay | Admitting: Hematology & Oncology

## 2022-07-19 DIAGNOSIS — Z86711 Personal history of pulmonary embolism: Secondary | ICD-10-CM

## 2022-07-19 DIAGNOSIS — I639 Cerebral infarction, unspecified: Secondary | ICD-10-CM

## 2022-08-18 DIAGNOSIS — Z880 Allergy status to penicillin: Secondary | ICD-10-CM | POA: Diagnosis not present

## 2022-08-18 DIAGNOSIS — I10 Essential (primary) hypertension: Secondary | ICD-10-CM | POA: Diagnosis not present

## 2022-08-18 DIAGNOSIS — Z800 Family history of malignant neoplasm of digestive organs: Secondary | ICD-10-CM | POA: Diagnosis not present

## 2022-08-18 DIAGNOSIS — Z8673 Personal history of transient ischemic attack (TIA), and cerebral infarction without residual deficits: Secondary | ICD-10-CM | POA: Diagnosis not present

## 2022-08-18 DIAGNOSIS — C719 Malignant neoplasm of brain, unspecified: Secondary | ICD-10-CM | POA: Diagnosis not present

## 2022-08-18 DIAGNOSIS — R479 Unspecified speech disturbances: Secondary | ICD-10-CM | POA: Diagnosis not present

## 2022-08-18 DIAGNOSIS — R5383 Other fatigue: Secondary | ICD-10-CM | POA: Diagnosis not present

## 2022-08-18 DIAGNOSIS — Z7901 Long term (current) use of anticoagulants: Secondary | ICD-10-CM | POA: Diagnosis not present

## 2022-08-18 DIAGNOSIS — Z85841 Personal history of malignant neoplasm of brain: Secondary | ICD-10-CM | POA: Diagnosis not present

## 2022-08-18 DIAGNOSIS — F0390 Unspecified dementia without behavioral disturbance: Secondary | ICD-10-CM | POA: Diagnosis not present

## 2022-08-18 DIAGNOSIS — E785 Hyperlipidemia, unspecified: Secondary | ICD-10-CM | POA: Diagnosis not present

## 2022-08-18 DIAGNOSIS — H538 Other visual disturbances: Secondary | ICD-10-CM | POA: Diagnosis not present

## 2022-08-18 DIAGNOSIS — Z923 Personal history of irradiation: Secondary | ICD-10-CM | POA: Diagnosis not present

## 2022-08-18 DIAGNOSIS — R251 Tremor, unspecified: Secondary | ICD-10-CM | POA: Diagnosis not present

## 2022-08-18 DIAGNOSIS — Z808 Family history of malignant neoplasm of other organs or systems: Secondary | ICD-10-CM | POA: Diagnosis not present

## 2022-08-18 DIAGNOSIS — I6621 Occlusion and stenosis of right posterior cerebral artery: Secondary | ICD-10-CM | POA: Diagnosis not present

## 2022-08-18 DIAGNOSIS — C711 Malignant neoplasm of frontal lobe: Secondary | ICD-10-CM | POA: Diagnosis not present

## 2022-08-18 DIAGNOSIS — R42 Dizziness and giddiness: Secondary | ICD-10-CM | POA: Diagnosis not present

## 2022-08-18 DIAGNOSIS — Z803 Family history of malignant neoplasm of breast: Secondary | ICD-10-CM | POA: Diagnosis not present

## 2022-08-25 ENCOUNTER — Other Ambulatory Visit: Payer: Self-pay | Admitting: Hematology & Oncology

## 2022-08-25 DIAGNOSIS — E559 Vitamin D deficiency, unspecified: Secondary | ICD-10-CM

## 2022-09-22 ENCOUNTER — Other Ambulatory Visit: Payer: Self-pay | Admitting: Family Medicine

## 2022-12-22 ENCOUNTER — Other Ambulatory Visit: Payer: Self-pay | Admitting: Family Medicine

## 2022-12-22 DIAGNOSIS — R5383 Other fatigue: Secondary | ICD-10-CM | POA: Diagnosis not present

## 2022-12-22 DIAGNOSIS — E860 Dehydration: Secondary | ICD-10-CM | POA: Diagnosis not present

## 2022-12-22 DIAGNOSIS — I959 Hypotension, unspecified: Secondary | ICD-10-CM | POA: Diagnosis not present

## 2022-12-22 DIAGNOSIS — C719 Malignant neoplasm of brain, unspecified: Secondary | ICD-10-CM | POA: Diagnosis not present

## 2022-12-22 DIAGNOSIS — C711 Malignant neoplasm of frontal lobe: Secondary | ICD-10-CM | POA: Diagnosis not present

## 2022-12-22 DIAGNOSIS — Z7901 Long term (current) use of anticoagulants: Secondary | ICD-10-CM | POA: Diagnosis not present

## 2022-12-22 DIAGNOSIS — Z86711 Personal history of pulmonary embolism: Secondary | ICD-10-CM | POA: Diagnosis not present

## 2022-12-22 DIAGNOSIS — N179 Acute kidney failure, unspecified: Secondary | ICD-10-CM | POA: Diagnosis not present

## 2022-12-22 DIAGNOSIS — Z79899 Other long term (current) drug therapy: Secondary | ICD-10-CM | POA: Diagnosis not present

## 2022-12-22 DIAGNOSIS — Z8673 Personal history of transient ischemic attack (TIA), and cerebral infarction without residual deficits: Secondary | ICD-10-CM | POA: Diagnosis not present

## 2022-12-22 DIAGNOSIS — Z881 Allergy status to other antibiotic agents status: Secondary | ICD-10-CM | POA: Diagnosis not present

## 2022-12-22 DIAGNOSIS — I10 Essential (primary) hypertension: Secondary | ICD-10-CM | POA: Diagnosis not present

## 2022-12-22 DIAGNOSIS — E785 Hyperlipidemia, unspecified: Secondary | ICD-10-CM

## 2022-12-22 DIAGNOSIS — R630 Anorexia: Secondary | ICD-10-CM | POA: Diagnosis not present

## 2022-12-22 DIAGNOSIS — Z85828 Personal history of other malignant neoplasm of skin: Secondary | ICD-10-CM | POA: Diagnosis not present

## 2022-12-22 DIAGNOSIS — U071 COVID-19: Secondary | ICD-10-CM | POA: Diagnosis not present

## 2022-12-22 DIAGNOSIS — Z880 Allergy status to penicillin: Secondary | ICD-10-CM | POA: Diagnosis not present

## 2023-01-27 DIAGNOSIS — C719 Malignant neoplasm of brain, unspecified: Secondary | ICD-10-CM | POA: Diagnosis not present

## 2023-02-10 ENCOUNTER — Encounter: Payer: Self-pay | Admitting: Adult Health

## 2023-02-10 ENCOUNTER — Ambulatory Visit: Payer: BC Managed Care – PPO | Admitting: Adult Health

## 2023-02-10 VITALS — BP 138/88 | HR 52 | Temp 97.8°F | Ht 64.0 in | Wt 219.0 lb

## 2023-02-10 DIAGNOSIS — E785 Hyperlipidemia, unspecified: Secondary | ICD-10-CM | POA: Diagnosis not present

## 2023-02-10 DIAGNOSIS — E538 Deficiency of other specified B group vitamins: Secondary | ICD-10-CM

## 2023-02-10 DIAGNOSIS — I10 Essential (primary) hypertension: Secondary | ICD-10-CM | POA: Diagnosis not present

## 2023-02-10 LAB — LIPID PANEL
Cholesterol: 116 mg/dL (ref 0–200)
HDL: 44.9 mg/dL (ref >39.00)
LDL Cholesterol: 53 mg/dL (ref 0–99)
NonHDL: 71.18
Total CHOL/HDL Ratio: 3
Triglycerides: 89 mg/dL (ref 0.0–149.0)
VLDL: 17.8 mg/dL (ref 0.0–40.0)

## 2023-02-10 LAB — BASIC METABOLIC PANEL
BUN: 13 mg/dL (ref 6–23)
CO2: 28 mEq/L (ref 19–32)
Calcium: 9.2 mg/dL (ref 8.4–10.5)
Chloride: 102 mEq/L (ref 96–112)
Creatinine, Ser: 0.75 mg/dL (ref 0.40–1.20)
GFR: 85.73 mL/min (ref >60.00)
Glucose, Bld: 98 mg/dL (ref 70–99)
Potassium: 4 mEq/L (ref 3.5–5.1)
Sodium: 139 mEq/L (ref 135–145)

## 2023-02-10 LAB — VITAMIN B12: Vitamin B-12: 667 pg/mL (ref 211–911)

## 2023-02-10 MED ORDER — LISINOPRIL 10 MG PO TABS: 10.0000 mg | ORAL_TABLET | Freq: Every day | ORAL | 3 refills | Status: DC

## 2023-02-10 MED ORDER — ATORVASTATIN CALCIUM 40 MG PO TABS: 40.0000 mg | ORAL_TABLET | Freq: Every day | ORAL | 3 refills | Status: DC

## 2023-02-10 NOTE — Progress Notes (Signed)
Subjective:    Patient ID: Kimberly Monroe, female    DOB: Oct 16, 1960, 62 y.o.   MRN: [id number]  HPI  62 year old female who  has a past medical history of Allergy, Anemia, Anxiety, Astrocytoma of frontal lobe (HCC) (11/02/2019), Basal cell cancer, Cognitive changes, Depression, Goals of care, counseling/discussion (11/02/2019), History of blood clotting disorder, cerebral artery stenosis, Hypertension, Melanoma (HCC) (2009), Menorrhagia (02/10/2013), Migraine, Ovarian cancer (HCC) (1995), Pulmonary embolism (HCC), Squamous cell carcinoma, and Stroke (HCC).  She is a patient of Dr. Caryl Never who I am seeing today for follow up. She was last seen aver a year ago. She needs a refill of atorvastatin She also needs a refill of lipid panel. She gets a CBC and CMP through oncology twice a year.  Lab Results  Component Value Date   CHOL 133 01/03/2022   HDL 44.40 01/03/2022   LDLCALC 71 01/03/2022   TRIG 90.0 01/03/2022   CHOLHDL 3 01/03/2022    She takes lisinopril 10 mg for hypertension. She denies dizziness, lightheadedness, blurred vision, or headaches.  BP Readings from Last 3 Encounters:  02/10/23 138/88  01/03/22 120/88  05/01/21 (!) 146/85   She is tolerating her medication well.   Of note about a month ago she was seen in the ER at [facility]. Her Cr and GFR were decreased due to dehydration. She would like to recheck   She would also like to recheck her B12 levels - she is taking oral b12.   Review of Systems See HPI   Past Medical History:  Diagnosis Date   Allergy    seasonal   Anemia    Anxiety    Astrocytoma of frontal lobe (HCC) 11/02/2019   Basal cell cancer    Cognitive changes    Depression    Goals of care, counseling/discussion 11/02/2019   History of blood clotting disorder    Hx of cerebral artery stenosis    Hypertension    Melanoma (HCC) 2009   insitu   Menorrhagia 02/10/2013   Migraine    history of/none in years   Ovarian cancer (HCC) 1995   left  ovary   Pulmonary embolism (HCC)    Squamous cell carcinoma    Stroke [facility])     Social History   Socioeconomic History   Marital status: Married    Spouse name: Not on file   Number of children: 3   Years of education: college   Highest education level: Not on file  Occupational History   Occupation: physical therapist  Tobacco Use   Smoking status: Never   Smokeless tobacco: Never   Tobacco comments:    never used cigarettes  Vaping Use   Vaping Use: Never used  Substance and Sexual Activity   Alcohol use: Yes    Alcohol/week: 0.0 standard drinks of alcohol    Comment: wine-occassionally   Drug use: No   Sexual activity: Not on file  Other Topics Concern   Not on file  Social History Narrative   Lives at home with husband and family.   Right-handed.   Caffeine use:  2-3 cups some days.   Social Determinants of Health   Financial Resource Strain: Not on file  Food Insecurity: Not on file  Transportation Needs: Not on file  Physical Activity: Not on file  Stress: Not on file  Social Connections: Not on file  Intimate Partner Violence: Not on file    Past Surgical History:  Procedure Laterality Date  ABDOMINAL HYSTERECTOMY  03/06/2013   BREAST DUCTAL SYSTEM EXCISION  2009   L intraductal papilloma   CEREBRAL ANGIOGRAM  2020   CESAREAN SECTION  1996, 2000   twins, singleton   IR ANGIO INTRA EXTRACRAN SEL COM CAROTID INNOMINATE BILAT MOD SED  11/05/2018   IR ANGIO VERTEBRAL SEL SUBCLAVIAN INNOMINATE UNI L MOD SED  11/05/2018   IR ANGIO VERTEBRAL SEL VERTEBRAL UNI R MOD SED  11/05/2018   IR US GUIDE VASC ACCESS RIGHT  11/05/2018   laparoscopy with ovarian cystectomy  1994   ovarian torsion   LAPAROTOMY  1995   ovarian thecoma   MOHS SURGERY  2009   Dr Park Liter    Family History  Problem Relation Age of Onset   Cancer Father 87       colon cancer   Colon cancer Father    Breast cancer Mother 67   Cancer Paternal Uncle        prostate   Stroke Sister 36     Allergies  Allergen Reactions   Lime Flavor [Flavoring Agent] Anaphylaxis   Sertraline Other (See Comments)   Amoxicillin Other (See Comments)    Patient stated,"my mom told me to never take it. I don't even know what type of reaction."   Penicillins Other (See Comments)    Did it involve swelling of the face/tongue/throat, SOB, or low BP? Unknown Did it involve sudden or severe rash/hives, skin peeling, or any reaction on the inside of your mouth or nose? Unknown Did you need to seek medical attention at a hospital or doctor's office? Unknown When did it last happen? Childhood allergy       If all above answers are "NO", may proceed with cephalosporin use.   Patient stated,"I was told not to take it."     Current Outpatient Medications on File Prior to Visit  Medication Sig Dispense Refill   atorvastatin (LIPITOR) 40 MG tablet TAKE 1 TABLET BY MOUTH EVERY DAY 90 tablet 0   cyanocobalamin 1000 MCG tablet Take by mouth daily.      lisinopril (ZESTRIL) 10 MG tablet TAKE 1 TABLET (10 MG TOTAL) BY MOUTH DAILY. PLEASE SCHEDULE FOLLOW UP FOR REFILLS. 90 tablet 0   LORazepam (ATIVAN) 1 MG tablet Take 1 mg by mouth as needed.     modafinil (PROVIGIL) 200 MG tablet Take 200 mg by mouth daily.     Vitamin D, Ergocalciferol, (DRISDOL) 1.25 MG (50000 UNIT) CAPS capsule TAKE 1 CAPSULE BY MOUTH ONE TIME PER WEEK 12 capsule 2   XARELTO 20 MG TABS tablet TAKE 1 TABLET BY MOUTH DAILY WITH SUPPER 90 tablet 1   No current facility-administered medications on file prior to visit.    BP 138/88   Pulse (!) 52   Temp 97.8 F (36.6 C) (Oral)   Ht 5\' 4"  (1.626 m)   Wt 219 lb (99.3 kg)   LMP 01/24/2013   SpO2 99%   BMI 37.59 kg/m       Objective:   Physical Exam Vitals and nursing note reviewed.  Constitutional:      Appearance: Normal appearance.  Cardiovascular:     Rate and Rhythm: Normal rate and regular rhythm.     Pulses: Normal pulses.     Heart sounds: Normal heart sounds.   Pulmonary:     Effort: Pulmonary effort is normal.     Breath sounds: Normal breath sounds.  Abdominal:     General: Abdomen is flat.  Palpations: Abdomen is soft.  Neurological:     Mental Status: She is alert.       Assessment & Plan:  1. Hyperlipidemia, unspecified hyperlipidemia type  - Lipid panel; Future - atorvastatin (LIPITOR) 40 MG tablet; Take 1 tablet (40 mg total) by mouth daily.  Dispense: 90 tablet; Refill: 3  2. Essential hypertension - slightly elevated today. Continue to monitor  - Basic Metabolic Panel; Future  3. B12 deficiency  - Vitamin B12; Future  Shirline Frees, NP

## 2023-02-10 NOTE — Patient Instructions (Signed)
It was great seeing you today   We will follow up with you regarding your lab work   Please let me know if you need anything   You can get your shingles and Tdap vaccinations at a local pharmacy for free

## 2023-02-11 DIAGNOSIS — R270 Ataxia, unspecified: Secondary | ICD-10-CM | POA: Diagnosis not present

## 2023-02-11 DIAGNOSIS — C719 Malignant neoplasm of brain, unspecified: Secondary | ICD-10-CM | POA: Diagnosis not present

## 2023-02-16 DIAGNOSIS — R270 Ataxia, unspecified: Secondary | ICD-10-CM | POA: Diagnosis not present

## 2023-02-16 DIAGNOSIS — C719 Malignant neoplasm of brain, unspecified: Secondary | ICD-10-CM | POA: Diagnosis not present

## 2023-02-25 ENCOUNTER — Other Ambulatory Visit: Payer: Self-pay | Admitting: Hematology & Oncology

## 2023-02-25 DIAGNOSIS — R270 Ataxia, unspecified: Secondary | ICD-10-CM | POA: Diagnosis not present

## 2023-02-25 DIAGNOSIS — Z86711 Personal history of pulmonary embolism: Secondary | ICD-10-CM

## 2023-02-25 DIAGNOSIS — C719 Malignant neoplasm of brain, unspecified: Secondary | ICD-10-CM | POA: Diagnosis not present

## 2023-02-25 DIAGNOSIS — I639 Cerebral infarction, unspecified: Secondary | ICD-10-CM

## 2023-03-03 DIAGNOSIS — R270 Ataxia, unspecified: Secondary | ICD-10-CM | POA: Diagnosis not present

## 2023-03-03 DIAGNOSIS — C719 Malignant neoplasm of brain, unspecified: Secondary | ICD-10-CM | POA: Diagnosis not present

## 2023-03-11 DIAGNOSIS — R270 Ataxia, unspecified: Secondary | ICD-10-CM | POA: Diagnosis not present

## 2023-03-11 DIAGNOSIS — C719 Malignant neoplasm of brain, unspecified: Secondary | ICD-10-CM | POA: Diagnosis not present

## 2023-04-13 ENCOUNTER — Other Ambulatory Visit: Payer: Self-pay | Admitting: Medical Oncology

## 2023-04-13 DIAGNOSIS — C711 Malignant neoplasm of frontal lobe: Secondary | ICD-10-CM

## 2023-04-14 ENCOUNTER — Encounter: Payer: Self-pay | Admitting: Medical Oncology

## 2023-04-14 ENCOUNTER — Inpatient Hospital Stay: Payer: BC Managed Care – PPO | Attending: Hematology & Oncology

## 2023-04-14 ENCOUNTER — Inpatient Hospital Stay: Payer: BC Managed Care – PPO | Admitting: Medical Oncology

## 2023-04-14 ENCOUNTER — Other Ambulatory Visit: Payer: Self-pay

## 2023-04-14 VITALS — BP 140/81 | HR 61 | Temp 98.2°F | Resp 19 | Ht 64.0 in

## 2023-04-14 DIAGNOSIS — Z8673 Personal history of transient ischemic attack (TIA), and cerebral infarction without residual deficits: Secondary | ICD-10-CM | POA: Diagnosis not present

## 2023-04-14 DIAGNOSIS — Z7901 Long term (current) use of anticoagulants: Secondary | ICD-10-CM | POA: Insufficient documentation

## 2023-04-14 DIAGNOSIS — C4322 Malignant melanoma of left ear and external auricular canal: Secondary | ICD-10-CM | POA: Insufficient documentation

## 2023-04-14 DIAGNOSIS — Z86711 Personal history of pulmonary embolism: Secondary | ICD-10-CM | POA: Insufficient documentation

## 2023-04-14 DIAGNOSIS — C711 Malignant neoplasm of frontal lobe: Secondary | ICD-10-CM | POA: Insufficient documentation

## 2023-04-14 DIAGNOSIS — Z923 Personal history of irradiation: Secondary | ICD-10-CM | POA: Diagnosis not present

## 2023-04-14 DIAGNOSIS — D509 Iron deficiency anemia, unspecified: Secondary | ICD-10-CM | POA: Diagnosis not present

## 2023-04-14 LAB — CMP (CANCER CENTER ONLY)
ALT: 19 U/L (ref 0–44)
AST: 18 U/L (ref 15–41)
Albumin: 3.9 g/dL (ref 3.5–5.0)
Alkaline Phosphatase: 101 U/L (ref 38–126)
Anion gap: 5 (ref 5–15)
BUN: 13 mg/dL (ref 8–23)
CO2: 30 mmol/L (ref 22–32)
Calcium: 9.5 mg/dL (ref 8.9–10.3)
Chloride: 105 mmol/L (ref 98–111)
Creatinine: 0.79 mg/dL (ref 0.44–1.00)
GFR, Estimated: >60 mL/min (ref >60)
Glucose, Bld: 114 mg/dL — ABNORMAL HIGH (ref 70–99)
Potassium: 4 mmol/L (ref 3.5–5.1)
Sodium: 140 mmol/L (ref 135–145)
Total Bilirubin: 0.7 mg/dL (ref 0.3–1.2)
Total Protein: 6.6 g/dL (ref 6.5–8.1)

## 2023-04-14 LAB — CBC WITH DIFFERENTIAL (CANCER CENTER ONLY)
Abs Immature Granulocytes: 0.01 10*3/uL (ref 0.00–0.07)
Basophils Absolute: 0.1 10*3/uL (ref 0.0–0.1)
Basophils Relative: 1 %
Eosinophils Absolute: 0.1 10*3/uL (ref 0.0–0.5)
Eosinophils Relative: 2 %
HCT: 44.7 % (ref 36.0–46.0)
Hemoglobin: 14.8 g/dL (ref 12.0–15.0)
Immature Granulocytes: 0 %
Lymphocytes Relative: 32 %
Lymphs Abs: 2 10*3/uL (ref 0.7–4.0)
MCH: 30 pg (ref 26.0–34.0)
MCHC: 33.1 g/dL (ref 30.0–36.0)
MCV: 90.5 fL (ref 80.0–100.0)
Monocytes Absolute: 0.5 10*3/uL (ref 0.1–1.0)
Monocytes Relative: 9 %
Neutro Abs: 3.4 10*3/uL (ref 1.7–7.7)
Neutrophils Relative %: 56 %
Platelet Count: 238 10*3/uL (ref 150–400)
RBC: 4.94 MIL/uL (ref 3.87–5.11)
RDW: 13.9 % (ref 11.5–15.5)
WBC Count: 6.1 10*3/uL (ref 4.0–10.5)
nRBC: 0 % (ref 0.0–0.2)

## 2023-04-14 LAB — LACTATE DEHYDROGENASE: LDH: 126 U/L (ref 98–192)

## 2023-04-14 NOTE — Progress Notes (Signed)
Hematology and Oncology Follow Up Visit  Kimberly Monroe [id number] 04/06/1961 62 y.o. 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   She continues to be seen by [facility] for her Astrocytoma follow up and monitoring. She hopes to switch to [facility] soon but this feels overwhelming at the moment.   She continues to do ok overall. She has traveled a lot over the last few months. She comes in with a cane.  She still has some cognitive issues.    She has had no bleeding.  She has had no nausea or vomiting.  There is been no change in bowel or bladder habits.  She is on Xarelto for the past history of thromboembolic disease. She denies any bleeding episodes or large bruises.   There is been no leg swelling.  Overall, performance status is ECOG 2. She has had intentional weight loss.   Wt Readings from Last 3 Encounters:  02/10/23 219 lb (99.3 kg)  01/03/22 229 lb (103.9 kg)  05/01/21 229 lb (103.9 kg)     Medications:  Allergies as of 04/14/2023       Reactions   Lime Flavor [flavoring Agent] Anaphylaxis   Amoxicillin Other (See Comments)   Patient stated,"my mom told me to never take it. I don't even know what type of reaction."   Penicillins Other (See Comments)   Did it involve swelling of the face/tongue/throat, SOB, or low BP? Unknown Did it involve sudden or severe rash/hives, skin peeling, or any reaction on the inside of your mouth or nose? Unknown Did you need to seek medical attention at a hospital or doctor's office? Unknown When did it last happen? Childhood allergy       If all above answers are  "NO", may proceed with cephalosporin use. Patient stated,"I was told not to take it."   Sertraline Other (See Comments)        Medication List        Accurate as of April 14, 2023 11:53 AM. If you have any questions, ask your nurse or doctor.          atorvastatin 40 MG tablet Commonly known as: LIPITOR Take 1 tablet (40 mg total) by mouth daily.   cyanocobalamin 1000 MCG tablet Take by mouth daily.   lisinopril 10 MG tablet Commonly known as: ZESTRIL Take 1 tablet (10 mg total) by mouth daily. Please schedule follow up for refills.   LORazepam 1 MG tablet Commonly known as: ATIVAN Take 1 mg by mouth as needed.   modafinil 200 MG tablet Commonly known as: PROVIGIL Take 200 mg by mouth daily.   Vitamin D (Ergocalciferol) 1.25 MG (50000 UNIT) Caps capsule Commonly known as: DRISDOL TAKE 1 CAPSULE BY MOUTH ONE TIME PER WEEK   Xarelto 20 MG Tabs tablet Generic drug: rivaroxaban TAKE 1 TABLET BY MOUTH DAILY WITH SUPPER        Allergies:  Allergies  Allergen Reactions   Lime Flavor [Flavoring Agent] Anaphylaxis   Amoxicillin Other (See Comments)    Patient stated,"my mom told me to never take it. I don't  even know what type of reaction."   Penicillins Other (See Comments)    Did it involve swelling of the face/tongue/throat, SOB, or low BP? Unknown Did it involve sudden or severe rash/hives, skin peeling, or any reaction on the inside of your mouth or nose? Unknown Did you need to seek medical attention at a hospital or doctor's office? Unknown When did it last happen? Childhood allergy       If all above answers are "NO", may proceed with cephalosporin use.   Patient stated,"I was told not to take it."    Sertraline Other (See Comments)    Past Medical History, Surgical history, Social history, and Family History were reviewed and updated.  Review of Systems: Review of Systems  Constitutional: Negative.   Eyes: Negative.   Respiratory: Negative.     Cardiovascular: Negative.   Gastrointestinal: Negative.   Genitourinary: Negative.   Musculoskeletal: Negative.   Skin: Negative.   Neurological: Negative.   Endo/Heme/Allergies: Negative.   Psychiatric/Behavioral: Negative.       Physical Exam:  height is 5\' 4"  (1.626 m). Her oral temperature is 98.2 F (36.8 C). Her blood pressure is 140/81 (abnormal) and her pulse is 61. Her respiration is 19 and oxygen saturation is 98%.   Wt Readings from Last 3 Encounters:  02/10/23 219 lb (99.3 kg)  01/03/22 229 lb (103.9 kg)  05/01/21 229 lb (103.9 kg)    Physical Exam Vitals reviewed.  HENT:     Head: Normocephalic and atraumatic.  Eyes:     Pupils: Pupils are equal, round, and reactive to light.  Cardiovascular:     Rate and Rhythm: Normal rate and regular rhythm.     Heart sounds: Normal heart sounds.  Pulmonary:     Effort: Pulmonary effort is normal.     Breath sounds: Normal breath sounds.  Abdominal:     General: Bowel sounds are normal.     Palpations: Abdomen is soft.  Musculoskeletal:        General: No tenderness or deformity. Normal range of motion.     Cervical back: Normal range of motion.  Lymphadenopathy:     Cervical: No cervical adenopathy.  Skin:    General: Skin is warm and dry.     Findings: No erythema or rash.  Neurological:     Mental Status: She is alert.     Comments: Neurological exam shows some decrease in her cognitive function. Some stuttering and difficulties finding words. She has some slight symmetrical decreased strength in her legs.  Her cranial nerves seem to be intact.  Psychiatric:     Comments: She has little bit of a flat affect.      Lab Results  Component Value Date   WBC 6.1 04/14/2023   HGB 14.8 04/14/2023   HCT 44.7 04/14/2023   MCV 90.5 04/14/2023   PLT 238 04/14/2023   Lab Results  Component Value Date   FERRITIN 105 07/26/2020   IRON 127 07/26/2020   TIBC 291 07/26/2020   UIBC 164 07/26/2020   IRONPCTSAT 44  07/26/2020   Lab Results  Component Value Date   RETICCTPCT 1.5 09/06/2012   RBC 4.94 04/14/2023   RETICCTABS 67.7 09/06/2012   No results found for: "KPAFRELGTCHN", "LAMBDASER", "KAPLAMBRATIO" Lab Results  Component Value Date   IGGSERUM 840 12/05/2020   IGA 294 12/05/2020   IGMSERUM 70 12/05/2020   No results found for: "TOTALPROTELP", "ALBUMINELP", "A1GS", "A2GS", "BETS", "BETA2SER", "GAMS", "MSPIKE", "SPEI"   Chemistry  Component Value Date/Time   NA 140 04/14/2023 0910   NA 141 07/23/2016 0956   K 4.0 04/14/2023 0910   K 4.2 07/23/2016 0956   CL 105 04/14/2023 0910   CO2 30 04/14/2023 0910   CO2 25 07/23/2016 0956   BUN 13 04/14/2023 0910   BUN 14.6 07/23/2016 0956   CREATININE 0.79 04/14/2023 0910   CREATININE 0.7 07/23/2016 0956      Component Value Date/Time   CALCIUM 9.5 04/14/2023 0910   CALCIUM 9.3 07/23/2016 0956   ALKPHOS 101 04/14/2023 0910   ALKPHOS 97 07/23/2016 0956   AST 18 04/14/2023 0910   AST 25 07/23/2016 0956   ALT 19 04/14/2023 0910   ALT 27 07/23/2016 0956   BILITOT 0.7 04/14/2023 0910   BILITOT 0.30 07/23/2016 0956       Impression and Plan: Kimberly Monroe is a very pleasant 62 yo caucasian female with history of idiopathic PE which has since resolved.   She will continue to be followed by [facility] until she transitions over to [facility]. Our social work team may be able to help her with this.   In terms of her long terms anticoagulant use she is doing well. CBC/CMP reviewed today. RTC 6 months or sooner as needed.   RTC 6 months APP, labs (CBC, CMP)   Encounter Diagnoses  Name Primary?   Astrocytoma of frontal lobe (HCC) Yes   Long term current use of anticoagulant therapy     Rushie Chestnut, [place] 7/9/202411:53 AM

## 2023-04-15 ENCOUNTER — Inpatient Hospital Stay: Payer: BC Managed Care – PPO | Admitting: Licensed Clinical Social Worker

## 2023-04-15 NOTE — Progress Notes (Signed)
[facility] CSW Progress Note  Clinical Child psychotherapist contacted patient by phone per the request of PA.  Patient inquiring about transferring from [facility] to [place].  Informed her of the process, including speaking with her MD at [facility].  She expressed having difficulty with that and did not have a family representative who could assist her.  She expressed no other needs.    Dorothey Baseman, LCSW Clinical Social Worker [facility]

## 2023-05-07 ENCOUNTER — Other Ambulatory Visit: Payer: Self-pay | Admitting: Hematology & Oncology

## 2023-05-07 DIAGNOSIS — E559 Vitamin D deficiency, unspecified: Secondary | ICD-10-CM

## 2023-05-25 ENCOUNTER — Ambulatory Visit: Payer: Medicare Other | Admitting: Podiatry

## 2023-06-24 DIAGNOSIS — I10 Essential (primary) hypertension: Secondary | ICD-10-CM | POA: Diagnosis not present

## 2023-06-24 DIAGNOSIS — C719 Malignant neoplasm of brain, unspecified: Secondary | ICD-10-CM | POA: Diagnosis not present

## 2023-06-24 DIAGNOSIS — R9089 Other abnormal findings on diagnostic imaging of central nervous system: Secondary | ICD-10-CM | POA: Diagnosis not present

## 2023-06-24 DIAGNOSIS — Z9889 Other specified postprocedural states: Secondary | ICD-10-CM | POA: Diagnosis not present

## 2023-06-29 DIAGNOSIS — C719 Malignant neoplasm of brain, unspecified: Secondary | ICD-10-CM | POA: Diagnosis not present

## 2023-09-15 ENCOUNTER — Other Ambulatory Visit: Payer: Self-pay | Admitting: Hematology & Oncology

## 2023-09-15 DIAGNOSIS — I639 Cerebral infarction, unspecified: Secondary | ICD-10-CM

## 2023-09-15 DIAGNOSIS — Z86711 Personal history of pulmonary embolism: Secondary | ICD-10-CM

## 2023-11-06 ENCOUNTER — Other Ambulatory Visit: Payer: Self-pay | Admitting: Family Medicine

## 2023-11-08 MED ORDER — LORAZEPAM 1 MG PO TABS: 1.0000 mg | ORAL_TABLET | ORAL | 0 refills | Status: AC | PRN

## 2023-12-21 DIAGNOSIS — C719 Malignant neoplasm of brain, unspecified: Secondary | ICD-10-CM | POA: Diagnosis not present

## 2023-12-21 DIAGNOSIS — Z08 Encounter for follow-up examination after completed treatment for malignant neoplasm: Secondary | ICD-10-CM | POA: Diagnosis not present

## 2023-12-21 DIAGNOSIS — Z85841 Personal history of malignant neoplasm of brain: Secondary | ICD-10-CM | POA: Diagnosis not present

## 2023-12-21 DIAGNOSIS — Z923 Personal history of irradiation: Secondary | ICD-10-CM | POA: Diagnosis not present

## 2024-01-15 ENCOUNTER — Other Ambulatory Visit: Payer: Self-pay | Admitting: Hematology & Oncology

## 2024-01-15 DIAGNOSIS — E559 Vitamin D deficiency, unspecified: Secondary | ICD-10-CM

## 2024-01-18 ENCOUNTER — Other Ambulatory Visit: Payer: Self-pay

## 2024-01-18 ENCOUNTER — Encounter (HOSPITAL_BASED_OUTPATIENT_CLINIC_OR_DEPARTMENT_OTHER): Payer: Self-pay

## 2024-01-18 ENCOUNTER — Emergency Department (HOSPITAL_BASED_OUTPATIENT_CLINIC_OR_DEPARTMENT_OTHER)
Admission: EM | Admit: 2024-01-18 | Discharge: 2024-01-18 | Disposition: A | Discharge status: Home / Self Care | Attending: Emergency Medicine | Admitting: Emergency Medicine

## 2024-01-18 DIAGNOSIS — Z7901 Long term (current) use of anticoagulants: Secondary | ICD-10-CM | POA: Diagnosis not present

## 2024-01-18 DIAGNOSIS — R040 Epistaxis: Secondary | ICD-10-CM | POA: Diagnosis not present

## 2024-01-18 DIAGNOSIS — Z85828 Personal history of other malignant neoplasm of skin: Secondary | ICD-10-CM | POA: Insufficient documentation

## 2024-01-18 DIAGNOSIS — Z8543 Personal history of malignant neoplasm of ovary: Secondary | ICD-10-CM | POA: Insufficient documentation

## 2024-01-18 DIAGNOSIS — I10 Essential (primary) hypertension: Secondary | ICD-10-CM | POA: Diagnosis not present

## 2024-01-18 LAB — CBC WITH DIFFERENTIAL/PLATELET
Abs Immature Granulocytes: 0.01 10*3/uL (ref 0.00–0.07)
Basophils Absolute: 0.1 10*3/uL (ref 0.0–0.1)
Basophils Relative: 1 %
Eosinophils Absolute: 0.1 10*3/uL (ref 0.0–0.5)
Eosinophils Relative: 2 %
HCT: 42.2 % (ref 36.0–46.0)
Hemoglobin: 14.2 g/dL (ref 12.0–15.0)
Immature Granulocytes: 0 %
Lymphocytes Relative: 34 %
Lymphs Abs: 2.4 10*3/uL (ref 0.7–4.0)
MCH: 30.1 pg (ref 26.0–34.0)
MCHC: 33.6 g/dL (ref 30.0–36.0)
MCV: 89.4 fL (ref 80.0–100.0)
Monocytes Absolute: 0.5 10*3/uL (ref 0.1–1.0)
Monocytes Relative: 7 %
Neutro Abs: 4.1 10*3/uL (ref 1.7–7.7)
Neutrophils Relative %: 56 %
Platelets: 246 10*3/uL (ref 150–400)
RBC: 4.72 MIL/uL (ref 3.87–5.11)
RDW: 13.7 % (ref 11.5–15.5)
WBC: 7.2 10*3/uL (ref 4.0–10.5)
nRBC: 0 % (ref 0.0–0.2)

## 2024-01-18 MED ORDER — OXYMETAZOLINE HCL 0.05 % NA SOLN
1.0000 | Freq: Once | NASAL | Status: AC
  Administered 2024-01-18: 1 via NASAL
  Filled 2024-01-18: qty 30

## 2024-01-18 MED ORDER — SODIUM CHLORIDE 0.9 % IV BOLUS
500.0000 mL | Freq: Once | INTRAVENOUS | Status: AC
  Administered 2024-01-18: 500 mL via INTRAVENOUS

## 2024-01-18 MED ORDER — SALINE SPRAY 0.65 % NA SOLN: 1.0000 | NASAL | 0 refills | Status: AC | PRN

## 2024-01-18 NOTE — Discharge Instructions (Signed)
 If you have another nosebleed here of the steps that I would like you to take.  1-apply direct pressure using the nasal clamp that you are given at discharge.  Leave it there for 30 minutes.  2-take off the nasal clamp and see if you are still bleeding.  3-if you are still bleeding, you can spray Afrin up your nose and reapply the clamp for 30 minutes.  4-if you are still bleeding after another 30 minutes, seek medical care.

## 2024-01-18 NOTE — ED Triage Notes (Signed)
 Pt c/o nosebleed x20-71min. On xarelto, compliant w same. Advises "clots, it's pouring out." Denies HA, dizziness.   Hx PE, HTN, "brain cancer"

## 2024-01-18 NOTE — ED Notes (Signed)
 Pt given discharge instructions and reviewed prescriptions. Opportunities given for questions. Pt verbalizes understanding. PIV removed x1. Jillyn Hidden, RN

## 2024-01-18 NOTE — ED Provider Notes (Signed)
 [place] EMERGENCY DEPARTMENT AT [facility] Provider Note  CSN: [id number] Arrival date & time: 01/18/24 1216  Chief Complaint(s) Epistaxis  HPI Kimberly Monroe is a 63 y.o. female who is here today for nosebleed.  Patient has a history of astrocytoma, takes Xarelto.  She says that she started to have bleeding coming from her nose 1 hour prior to arrival.   Past Medical History Past Medical History:  Diagnosis Date  . Allergy    seasonal  . Anemia   . Anxiety   . 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 Basal cell cancer   . Cognitive changes   . Depression   . Goals of care, counseling/discussion 11/02/2019  . History of blood clotting disorder   . Hx of cerebral artery stenosis   . Hypertension   . Melanoma (HCC) 2009   insitu  . Menorrhagia 02/10/2013  . Migraine    history of/none in years  . Ovarian cancer (HCC) 1995   left ovary  . Pulmonary embolism (HCC)   . Squamous cell carcinoma   . Stroke [facility])    Patient Active Problem List   Diagnosis Date Noted  . Hyperlipidemia 01/11/2020  . 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 Goals of care, counseling/discussion 11/02/2019  . Abnormal brain MRI 12/07/2018  . Confusion 12/07/2018  . TIA (transient ischemic attack) 11/04/2018  . CVA (cerebral vascular accident) (HCC) 03/29/2018  . Essential hypertension 06/20/2016  . Suicidal ideation 09/20/2013  . MDD (major depressive disorder) 09/20/2013  . Menorrhagia 02/10/2013  . Pulmonary embolism (HCC) 09/09/2012  . History of pulmonary embolism 09/06/2012  . Acute respiratory failure with hypoxia (HCC) 02/04/2012  . Bilateral pulmonary embolism (HCC) 02/04/2012  . Hypokalemia 02/04/2012  . Hyponatremia 02/04/2012  . Elevation of cardiac enzymes 02/04/2012  . Family History of hypercoagulable state 02/04/2012  . History of squamous cell carcinoma of skin 12/17/2011  . History of basal cell carcinoma (BCC) 12/17/2011  . Depression 12/17/2011   . Iron deficiency anemia - severe 12/17/2011  . Melanoma (HCC)    Home Medication(s) Prior to Admission medications   Medication Sig Start Date End Date Taking? Authorizing Provider  atorvastatin (LIPITOR) 40 MG tablet Take 1 tablet (40 mg total) by mouth daily. 02/10/23   Nafziger, Randel Buss, NP  cyanocobalamin 1000 MCG tablet Take by mouth daily.     [provider]  lisinopril (ZESTRIL) 10 MG tablet Take 1 tablet (10 mg total) by mouth daily. Please schedule follow up for refills. 02/10/23   Nafziger, Randel Buss, NP  LORazepam (ATIVAN) 1 MG tablet Take 1 tablet (1 mg total) by mouth as needed. 11/08/23   Burchette, Marijean Shouts, MD  modafinil (PROVIGIL) 200 MG tablet Take 200 mg by mouth daily. 01/22/21   [provider]  Vitamin D, Ergocalciferol, (DRISDOL) 1.25 MG (50000 UNIT) CAPS capsule TAKE 1 CAPSULE BY MOUTH ONE TIME PER WEEK 01/15/24   Ennever, Peter R, MD  XARELTO 20 MG TABS tablet TAKE 1 TABLET BY MOUTH DAILY WITH SUPPER 09/16/23   Ivor Mars, MD  Past Surgical History Past Surgical History:  Procedure Laterality Date  . ABDOMINAL HYSTERECTOMY  03/06/2013  . BREAST DUCTAL SYSTEM EXCISION  2009   L intraductal papilloma  . CEREBRAL ANGIOGRAM  2020  . CESAREAN SECTION  1996, 2000   twins, singleton  . IR ANGIO INTRA EXTRACRAN SEL COM CAROTID INNOMINATE BILAT MOD SED  11/05/2018  . IR ANGIO VERTEBRAL SEL SUBCLAVIAN INNOMINATE UNI L MOD SED  11/05/2018  . IR ANGIO VERTEBRAL SEL VERTEBRAL UNI R MOD SED  11/05/2018  . IR US  GUIDE VASC ACCESS RIGHT  11/05/2018  . laparoscopy with ovarian cystectomy  1994   ovarian torsion  . 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 MOHS SURGERY  2009   Dr Roel Clarity   Family History Family History  Problem Relation Age of Onset  . Cancer Father 37       colon cancer  . Colon cancer Father   . Breast cancer Mother 69   . Cancer Paternal Uncle        prostate  . Stroke Sister 39    Social History Social History   Tobacco Use  . Smoking status: Never  . Smokeless tobacco: Never  . Tobacco comments:    never used cigarettes  Vaping Use  . Vaping status: Never Used  Substance Use Topics  . Alcohol use: Yes    Alcohol/week: 0.0 standard drinks of alcohol    Comment: wine-occassionally  . Drug use: No   Allergies Lime flavor [flavoring agent (non-screening)], Amoxicillin, Penicillins, and Sertraline  Review of Systems Review of Systems  Physical Exam Vital Signs  I have reviewed the triage vital signs BP (!) 182/82   Pulse 64   Temp 98.3 F (36.8 C)   Resp 16   LMP 01/24/2013   SpO2 100%   Physical Exam  ED Results and Treatments Labs (all labs ordered are listed, but only abnormal results are displayed) Labs Reviewed - No data to display                                                                                                                        Radiology No results found.  Pertinent labs & imaging results that were available during my care of the patient were reviewed by me and considered in my medical decision making (see MDM for details).  Medications Ordered in ED Medications - No data to display  Procedures Procedures  (including critical care time)  Medical Decision Making / ED Course   This patient presents to the ED for concern of epistaxis, this involves an extensive number of treatment options, and is a complaint that carries with it a high risk of complications and morbidity.  The differential diagnosis includes anterior epistaxis, less likely posterior epistaxis.  MDM: Patient's nosebleed was able to stop with direct pressure.  Check a CBC on the patient given her medical history.  Must provide her 500 cc of IV fluids  because she is concerned she is dehydrated.  Patient overall looks well.  Reassessment 2:10 PM-patient's hemoglobin and platelets normal.  Continues to have no bleeding.  Discharged her with some Afrin and a nasal clip.  Instructions on what to do if she has a repeat nosebleed.  Patient agreeable with plan.  Additional history obtained: -Additional history obtained from son at bedside -External records from outside source obtained and reviewed including: Chart review including previous notes, labs, imaging, consultation notes   Lab Tests: -I ordered, reviewed, and interpreted labs.   The pertinent results include:   Labs Reviewed - No data to display     Medicines ordered and prescription drug management: No orders of the defined types were placed in this encounter.   -I have reviewed the patients home medicines and have made adjustments as needed Cardiac Monitoring: The patient was maintained on a cardiac monitor.  I personally viewed and interpreted the cardiac monitored which showed an underlying rhythm of: Normal sinus rhythm  Social Determinants of Health:  Factors impacting patients care include: Multiple medical comorbidities including anticoagulated status.   Reevaluation: After the interventions noted above, I reevaluated the patient and found that they have :improved  Co morbidities that complicate the patient evaluation . Past Medical History:  Diagnosis Date  . Allergy    seasonal  . Anemia   . Anxiety   . 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 Basal cell cancer   . Cognitive changes   . Depression   . Goals of care, counseling/discussion 11/02/2019  . History of blood clotting disorder   . Hx of cerebral artery stenosis   . Hypertension   . Melanoma (HCC) 2009   insitu  . Menorrhagia 02/10/2013  . Migraine    history of/none in years  . Ovarian cancer (HCC) 1995   left ovary  . Pulmonary embolism (HCC)   . Squamous cell carcinoma   . Stroke  [facility])       Dispostion: I considered admission for this patient, however patient improved with symptomatic management.     Final Clinical Impression(s) / ED Diagnoses Final diagnoses:  None     @PCDICTATION @    Afton Horse T, DO 01/21/24 [phone]

## 2024-01-19 ENCOUNTER — Telehealth: Payer: Self-pay

## 2024-01-19 NOTE — Transitions of Care (Post Inpatient/ED Visit) (Signed)
   01/19/2024  Name: Kimberly Monroe MRN: [id number] DOB: 01-Oct-1961  Today's TOC FU Call Status: Today's TOC FU Call Status:: Successful TOC FU Call Completed TOC FU Call Complete Date: 01/19/24 Patient's Name and Date of Birth confirmed.  Transition Care Management Follow-up Telephone Call Date of Discharge: 01/18/24 Discharge Facility: [facility] ([facility]) Type of Discharge: Emergency Department Reason for ED Visit: Other: (epistaxis) How have you been since you were released from the hospital?: Better Any questions or concerns?: No  Items Reviewed: Did you receive and understand the discharge instructions provided?: Yes Medications obtained,verified, and reconciled?: Yes (Medications Reviewed) Any new allergies since your discharge?: Yes Dietary orders reviewed?: NA Do you have support at home?: Yes People in Home [RPT]: child(ren), adult  Medications Reviewed Today: Medications Reviewed Today     Reviewed by Darrall Ellison, LPN (Licensed Practical Nurse) on 01/19/24 at 1129  Med List Status: <None>   Medication Order Taking? Sig Documenting Provider Last Dose Status Informant  atorvastatin (LIPITOR) 40 MG tablet [id number] No Take 1 tablet (40 mg total) by mouth daily. Nafziger, Cory, NP Taking Active   cyanocobalamin 1000 MCG tablet [id number] No Take by mouth daily.  [provider] Taking Active   lisinopril (ZESTRIL) 10 MG tablet [id number] No Take 1 tablet (10 mg total) by mouth daily. Please schedule follow up for refills. Nafziger, Randel Buss, NP Taking Active   LORazepam (ATIVAN) 1 MG tablet [id number]  Take 1 tablet (1 mg total) by mouth as needed. Marquetta Sit, MD  Active   modafinil (PROVIGIL) 200 MG tablet [id number] No Take 200 mg by mouth daily. [provider] Taking Active   sodium chloride (OCEAN) 0.65 % SOLN nasal spray [id number]  Place 1 spray into both nostrils as needed for congestion. Nathanael Baker, DO  Active   Vitamin D,  Ergocalciferol, (DRISDOL) 1.25 MG (50000 UNIT) CAPS capsule [id number]  TAKE 1 CAPSULE BY MOUTH ONE TIME PER WEEK Ennever, Peter R, MD  Active   XARELTO 20 MG TABS tablet [id number]  TAKE 1 TABLET BY MOUTH DAILY WITH SUPPER Ivor Mars, MD  Active   Med List Note Erasmo Hatch, CPhT 11/26/11 2336): Pharmacy states sertraline, clonazepam, and lamictal are all supposed to be BID, however, patient states she only takes them in the morning.            Home Care and Equipment/Supplies: Were Home Health Services Ordered?: NA Any new equipment or medical supplies ordered?: NA  Functional Questionnaire: Do you need assistance with bathing/showering or dressing?: No Do you need assistance with meal preparation?: No Do you need assistance with eating?: No Do you have difficulty maintaining continence: No Do you need assistance with getting out of bed/getting out of a chair/moving?: No Do you have difficulty managing or taking your medications?: No  Follow up appointments reviewed: PCP Follow-up appointment confirmed?: No MD Provider Line Number:[phone] Given: No Specialist Hospital Follow-up appointment confirmed?: NA Do you need transportation to your follow-up appointment?: No Do you understand care options if your condition(s) worsen?: Yes-patient verbalized understanding    SIGNATURE Darrall Ellison, LPN [facility] Nurse Health Advisor Direct Dial [phone]

## 2024-03-18 ENCOUNTER — Other Ambulatory Visit: Payer: Self-pay | Admitting: Adult Health

## 2024-03-18 DIAGNOSIS — E785 Hyperlipidemia, unspecified: Secondary | ICD-10-CM

## 2024-03-21 ENCOUNTER — Inpatient Hospital Stay: Admitting: Family Medicine

## 2024-03-21 DIAGNOSIS — Z86711 Personal history of pulmonary embolism: Secondary | ICD-10-CM | POA: Diagnosis not present

## 2024-03-21 DIAGNOSIS — Z880 Allergy status to penicillin: Secondary | ICD-10-CM | POA: Diagnosis not present

## 2024-03-21 DIAGNOSIS — Z85841 Personal history of malignant neoplasm of brain: Secondary | ICD-10-CM | POA: Diagnosis not present

## 2024-03-21 DIAGNOSIS — I6621 Occlusion and stenosis of right posterior cerebral artery: Secondary | ICD-10-CM | POA: Diagnosis not present

## 2024-03-21 DIAGNOSIS — C719 Malignant neoplasm of brain, unspecified: Secondary | ICD-10-CM | POA: Diagnosis not present

## 2024-03-21 DIAGNOSIS — C711 Malignant neoplasm of frontal lobe: Secondary | ICD-10-CM | POA: Diagnosis not present

## 2024-03-21 DIAGNOSIS — I10 Essential (primary) hypertension: Secondary | ICD-10-CM | POA: Diagnosis not present

## 2024-03-21 DIAGNOSIS — Z923 Personal history of irradiation: Secondary | ICD-10-CM | POA: Diagnosis not present

## 2024-03-21 DIAGNOSIS — Z08 Encounter for follow-up examination after completed treatment for malignant neoplasm: Secondary | ICD-10-CM | POA: Diagnosis not present

## 2024-03-21 DIAGNOSIS — R5383 Other fatigue: Secondary | ICD-10-CM | POA: Diagnosis not present

## 2024-03-21 DIAGNOSIS — E785 Hyperlipidemia, unspecified: Secondary | ICD-10-CM | POA: Diagnosis not present

## 2024-03-21 DIAGNOSIS — Z808 Family history of malignant neoplasm of other organs or systems: Secondary | ICD-10-CM | POA: Diagnosis not present

## 2024-03-21 DIAGNOSIS — F0390 Unspecified dementia without behavioral disturbance: Secondary | ICD-10-CM | POA: Diagnosis not present

## 2024-03-21 DIAGNOSIS — R251 Tremor, unspecified: Secondary | ICD-10-CM | POA: Diagnosis not present

## 2024-03-21 DIAGNOSIS — Z7901 Long term (current) use of anticoagulants: Secondary | ICD-10-CM | POA: Diagnosis not present

## 2024-04-12 ENCOUNTER — Ambulatory Visit: Payer: Self-pay | Admitting: Family Medicine

## 2024-04-12 ENCOUNTER — Ambulatory Visit (INDEPENDENT_AMBULATORY_CARE_PROVIDER_SITE_OTHER): Admitting: Family Medicine

## 2024-04-12 ENCOUNTER — Ambulatory Visit: Admitting: Family Medicine

## 2024-04-12 ENCOUNTER — Encounter: Payer: Self-pay | Admitting: Family Medicine

## 2024-04-12 VITALS — BP 150/80 | HR 68 | Temp 97.7°F | Wt 203.5 lb

## 2024-04-12 DIAGNOSIS — E785 Hyperlipidemia, unspecified: Secondary | ICD-10-CM

## 2024-04-12 DIAGNOSIS — Z86711 Personal history of pulmonary embolism: Secondary | ICD-10-CM | POA: Diagnosis not present

## 2024-04-12 DIAGNOSIS — I10 Essential (primary) hypertension: Secondary | ICD-10-CM | POA: Diagnosis not present

## 2024-04-12 LAB — COMPREHENSIVE METABOLIC PANEL WITH GFR
ALT: 17 U/L (ref 0–35)
AST: 19 U/L (ref 0–37)
Albumin: 4.2 g/dL (ref 3.5–5.2)
Alkaline Phosphatase: 74 U/L (ref 39–117)
BUN: 15 mg/dL (ref 6–23)
CO2: 27 meq/L (ref 19–32)
Calcium: 9.5 mg/dL (ref 8.4–10.5)
Chloride: 104 meq/L (ref 96–112)
Creatinine, Ser: 0.71 mg/dL (ref 0.40–1.20)
GFR: 90.81 mL/min (ref >60.00)
Glucose, Bld: 99 mg/dL (ref 70–99)
Potassium: 4.1 meq/L (ref 3.5–5.1)
Sodium: 139 meq/L (ref 135–145)
Total Bilirubin: 0.5 mg/dL (ref 0.2–1.2)
Total Protein: 6.9 g/dL (ref 6.0–8.3)

## 2024-04-12 LAB — LIPID PANEL
Cholesterol: 152 mg/dL (ref 0–200)
HDL: 47.7 mg/dL (ref >39.00)
LDL Cholesterol: 90 mg/dL (ref 0–99)
NonHDL: 104.05
Total CHOL/HDL Ratio: 3
Triglycerides: 68 mg/dL (ref 0.0–149.0)
VLDL: 13.6 mg/dL (ref 0.0–40.0)

## 2024-04-12 MED ORDER — LISINOPRIL 10 MG PO TABS: 10.0000 mg | ORAL_TABLET | Freq: Every day | ORAL | 3 refills | Status: DC

## 2024-04-12 NOTE — Progress Notes (Signed)
 Established Patient Office Visit  Subjective   Patient ID: Kimberly Monroe, female    DOB: Jun 01, 1961  Age: 63 y.o. MRN: [id number]  Chief Complaint  Patient presents with   Medical Management of Chronic Issues    HPI   Kimberly Monroe is seen today accompanied by her husband.  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  This is followed at [facility].  She has had prior basal cell carcinoma of the skin.  Also has history of melanoma.  She has hyperlipidemia and questionable history of previous TIA.  She is on chronic Xarelto  per hematologist.  Takes low-dose lisinopril  10 mg daily for hypertension.  She had been on atorvastatin  for hyperlipidemia but developed increased muscle cramps and after stopping atorvastatin  within days her muscle cramps went away.  She is reluctant to take atorvastatin  but would consider other possible options such as rosuvastatin .  She is currently making some dietary changes and has been exercising more.  Home blood pressures have been relatively stable previously.  She is due for some follow-up labs.  Several years ago was thought to have had possible CVA but area in question corresponded exactly to the location of her astrocytoma.  She does have some chronic cognitive impairment which is stable.  She has difficulty at times with word finding.  Past Medical History:  Diagnosis Date   Allergy    seasonal   Anemia    Anxiety    Astrocytoma of frontal lobe (HCC) 11/02/2019   Basal cell cancer    Cognitive changes    Depression    Goals of care, counseling/discussion 11/02/2019   History of blood clotting disorder    Hx of cerebral artery stenosis    Hypertension    Melanoma (HCC) 2009   insitu   Menorrhagia 02/10/2013   Migraine    history of/none in years   Ovarian cancer (HCC) 1995   left ovary   Pulmonary embolism (HCC)     Squamous cell carcinoma    Stroke [facility])    Past Surgical History:  Procedure Laterality Date   ABDOMINAL HYSTERECTOMY  03/06/2013   BREAST DUCTAL SYSTEM EXCISION  2009   L intraductal papilloma   CEREBRAL ANGIOGRAM  2020   CESAREAN SECTION  1996, 2000   twins, singleton   IR ANGIO INTRA EXTRACRAN SEL COM CAROTID INNOMINATE BILAT MOD SED  11/05/2018   IR ANGIO VERTEBRAL SEL SUBCLAVIAN INNOMINATE UNI L MOD SED  11/05/2018   IR ANGIO VERTEBRAL SEL VERTEBRAL UNI R MOD SED  11/05/2018   IR US  GUIDE VASC ACCESS RIGHT  11/05/2018   laparoscopy with ovarian cystectomy  1994   ovarian torsion   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 She has never used smokeless tobacco. She reports current alcohol use. She reports that she does not use drugs. family history includes Breast cancer (age of onset: 39) in her mother; Cancer in her paternal uncle; Cancer (age of onset: 1) in her father; Colon cancer in her father; Stroke (age of onset: 77) in her sister. Allergies  Allergen Reactions   Lime Flavor [Flavoring Agent (Non-Screening)] Anaphylaxis   Amoxicillin Other (See Comments)    Patient stated,my mom told me to never take it. I don't even know what type of reaction.  Penicillins Other (See Comments)    Did it involve swelling of the face/tongue/throat, SOB, or low BP? Unknown Did it involve sudden or severe rash/hives, skin peeling, or any reaction on the inside of your mouth or nose? Unknown Did you need to seek medical attention at a hospital or doctor's office? Unknown When did it last happen? Childhood allergy       If all above answers are "NO", may proceed with cephalosporin use.   Patient stated,I was told not to take it.    Sertraline  Other (See Comments)    Review of Systems  Constitutional:  Negative for malaise/fatigue.  Eyes:  Negative for blurred vision.  Respiratory:  Negative for shortness of breath.    Cardiovascular:  Negative for chest pain.  Gastrointestinal:  Negative for abdominal pain.  Neurological:  Negative for dizziness, weakness and headaches.      Objective:     BP (!) 150/80 (BP Location: Left Arm, Cuff Size: Large)   Pulse 68   Temp 97.7 F (36.5 C) (Oral)   Wt 203 lb 8 oz (92.3 kg)   LMP 01/24/2013   SpO2 95%   BMI 34.93 kg/m  BP Readings from Last 3 Encounters:  04/12/24 (!) 150/80  01/18/24 (!) 183/80  04/14/23 (!) 140/81   Wt Readings from Last 3 Encounters:  04/12/24 203 lb 8 oz (92.3 kg)  02/10/23 219 lb (99.3 kg)  01/03/22 229 lb (103.9 kg)      Physical Exam Vitals reviewed.  Constitutional:      General: She is not in acute distress.    Appearance: She is well-developed. She is not ill-appearing.  Eyes:     Pupils: Pupils are equal, round, and reactive to light.  Neck:     Thyroid : No thyromegaly.     Vascular: No JVD.  Cardiovascular:     Rate and Rhythm: Normal rate and regular rhythm.     Heart sounds:     No gallop.  Pulmonary:     Effort: Pulmonary effort is normal. No respiratory distress.     Breath sounds: Normal breath sounds. No wheezing or rales.  Musculoskeletal:     Cervical back: Neck supple.  Neurological:     Mental Status: She is alert.      Results for orders placed or performed in visit on 04/12/24  CMP  Result Value Ref Range   Sodium 139 135 - 145 mEq/L   Potassium 4.1 3.5 - 5.1 mEq/L   Chloride 104 96 - 112 mEq/L   CO2 27 19 - 32 mEq/L   Glucose, Bld 99 70 - 99 mg/dL   BUN 15 6 - 23 mg/dL   Creatinine, Ser 9.28 0.40 - 1.20 mg/dL   Total Bilirubin 0.5 0.2 - 1.2 mg/dL   Alkaline Phosphatase 74 39 - 117 U/L   AST 19 0 - 37 U/L   ALT 17 0 - 35 U/L   Total Protein 6.9 6.0 - 8.3 g/dL   Albumin 4.2 3.5 - 5.2 g/dL   GFR 09.18 >39.99 mL/min   Calcium  9.5 8.4 - 10.5 mg/dL  Lipid panel  Result Value Ref Range   Cholesterol 152 0 - 200 mg/dL   Triglycerides 31.9 0.0 - 149.0 mg/dL   HDL 52.29 >60.99 mg/dL    VLDL 86.3 0.0 - 59.9 mg/dL   LDL Cholesterol 90 0 - 99 mg/dL   Total CHOL/HDL Ratio 3    NonHDL 104.05     Last CBC Lab Results  Component Value Date   WBC 7.2 01/18/2024   HGB 14.2 01/18/2024   HCT 42.2 01/18/2024   MCV 89.4 01/18/2024   MCH 30.1 01/18/2024   RDW 13.7 01/18/2024   PLT 246 01/18/2024   Last metabolic panel Lab Results  Component Value Date   GLUCOSE 99 04/12/2024   NA 139 04/12/2024   K 4.1 04/12/2024   CL 104 04/12/2024   CO2 27 04/12/2024   BUN 15 04/12/2024   CREATININE 0.71 04/12/2024   GFR 90.81 04/12/2024   CALCIUM  9.5 04/12/2024   PROT 6.9 04/12/2024   ALBUMIN 4.2 04/12/2024   BILITOT 0.5 04/12/2024   ALKPHOS 74 04/12/2024   AST 19 04/12/2024   ALT 17 04/12/2024   ANIONGAP 5 04/14/2023   Last lipids Lab Results  Component Value Date   CHOL 152 04/12/2024   HDL 47.70 04/12/2024   LDLCALC 90 04/12/2024   TRIG 68.0 04/12/2024   CHOLHDL 3 04/12/2024      The ASCVD Risk score (Arnett DK, et al., 2019) failed to calculate for the following reasons:   Risk score cannot be calculated because patient has a medical history suggesting prior/existing ASCVD    Assessment & Plan:   Problem List Items Addressed This Visit       Unprioritized   Hyperlipidemia - Primary   Relevant Medications   lisinopril  (ZESTRIL ) 10 MG tablet   Other Relevant Orders   Lipid panel (Completed)   CMP (Completed)   Essential hypertension   Relevant Medications   lisinopril  (ZESTRIL ) 10 MG tablet   Other Relevant Orders   Lipid panel (Completed)   CMP (Completed)  She has hypertension treated with lisinopril .  Blood pressure up today but they state this is atypical for her.  We recommended close home monitoring and bring back her cuff for comparison with ours at follow-up visit in about a month.  Watch sodium intake.  Refill lisinopril  for 1 year  Possible adverse reaction with atorvastatin  with muscle cramps.  These resolved after patient stopped  atorvastatin .  She would like to get follow-up fasting lipid today and she has not ruled out trial of another statin such as rosuvastatin  if LDL cholesterol still up above 70  Return in about 1 month (around 05/13/2024).    Wolm Scarlet, MD

## 2024-04-12 NOTE — Patient Instructions (Signed)
 Monitor blood pressure and be in touch if consistently > 140/90.

## 2024-04-13 MED ORDER — ROSUVASTATIN CALCIUM 10 MG PO TABS: 10.0000 mg | ORAL_TABLET | Freq: Every day | ORAL | 0 refills | Status: AC

## 2024-04-18 ENCOUNTER — Encounter: Payer: Self-pay | Admitting: Family Medicine

## 2024-04-19 MED ORDER — LISINOPRIL 20 MG PO TABS: 20.0000 mg | ORAL_TABLET | Freq: Every day | ORAL | 0 refills | Status: DC

## 2024-04-20 ENCOUNTER — Other Ambulatory Visit: Payer: Self-pay | Admitting: Hematology & Oncology

## 2024-04-20 DIAGNOSIS — I639 Cerebral infarction, unspecified: Secondary | ICD-10-CM

## 2024-04-20 DIAGNOSIS — Z86711 Personal history of pulmonary embolism: Secondary | ICD-10-CM

## 2024-05-13 ENCOUNTER — Encounter: Payer: Self-pay | Admitting: Family Medicine

## 2024-05-13 ENCOUNTER — Ambulatory Visit: Admitting: Family Medicine

## 2024-05-13 VITALS — BP 118/68 | HR 65 | Temp 96.5°F | Wt 198.0 lb

## 2024-05-13 DIAGNOSIS — I10 Essential (primary) hypertension: Secondary | ICD-10-CM

## 2024-05-13 NOTE — Progress Notes (Signed)
 Established Patient Office Visit  Subjective   Patient ID: Kimberly Monroe, female    DOB: [birth year]  Age: 63 y.o. MRN: [id number]  Chief Complaint  Patient presents with   Medical Management of Chronic Issues    HPI   Seen for follow-up today regarding hypertension.  Elevated reading last visit.  She does take lisinopril  regularly.  She is currently following eating plan based on a book called good energy .  She feels well overall.  She is lost her 6 pounds since last visit.  We did recommend she bring back her cuff today to compare with ours.  Her home readings have been fairly well-controlled.  No recent peripheral edema issues.  Recent lipids LDL 90.  Prior history of CVA.  We had discussed goal LDL less than 70.  She briefly took rosuvastatin  but had increased muscle cramps and stopped the rosuvastatin  and cramps went away.  She declines further statins at this time.  Past Medical History:  Diagnosis Date   Allergy    seasonal   Anemia    Anxiety    Astrocytoma of frontal lobe (HCC) 11/02/2019   Basal cell cancer    Cognitive changes    Depression    Goals of care, counseling/discussion 11/02/2019   History of blood clotting disorder    Hx of cerebral artery stenosis    Hypertension    Melanoma (HCC) 2009   insitu   Menorrhagia 02/10/2013   Migraine    history of/none in years   Ovarian cancer (HCC) 1995   left ovary   Pulmonary embolism (HCC)    Squamous cell carcinoma    Stroke [facility])    Past Surgical History:  Procedure Laterality Date   ABDOMINAL HYSTERECTOMY  03/06/2013   BREAST DUCTAL SYSTEM EXCISION  2009   L intraductal papilloma   CEREBRAL ANGIOGRAM  2020   CESAREAN SECTION  1996, 2000   twins, singleton   IR ANGIO INTRA EXTRACRAN SEL COM CAROTID INNOMINATE BILAT MOD SED  11/05/2018   IR ANGIO VERTEBRAL SEL SUBCLAVIAN INNOMINATE UNI L MOD SED  11/05/2018   IR ANGIO VERTEBRAL SEL VERTEBRAL UNI R MOD SED  11/05/2018   IR US  GUIDE VASC ACCESS RIGHT   11/05/2018   laparoscopy with ovarian cystectomy  1994   ovarian torsion   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 She has never used smokeless tobacco. She reports current alcohol use. She reports that she does not use drugs. family history includes Breast cancer (age of onset: 47) in her mother; Cancer in her paternal uncle; Cancer (age of onset: 3) in her father; Colon cancer in her father; Stroke (age of onset: 54) in her sister. Allergies  Allergen Reactions   Lime Flavor [Flavoring Agent (Non-Screening)] Anaphylaxis   Amoxicillin Other (See Comments)    Patient stated,my mom told me to never take it. I don't even know what type of reaction.   Penicillins Other (See Comments)    Did it involve swelling of the face/tongue/throat, SOB, or low BP? Unknown Did it involve sudden or severe rash/hives, skin peeling, or any reaction on the inside of your mouth or nose? Unknown Did you need to seek medical attention at a hospital or doctor's office? Unknown When did it last happen? Childhood allergy       If all above answers are "NO", may proceed with cephalosporin use.  Patient stated,I was told not to take it.    Sertraline  Other (See Comments)    Review of Systems  Constitutional:  Negative for malaise/fatigue.  Eyes:  Negative for blurred vision.  Respiratory:  Negative for shortness of breath.   Cardiovascular:  Negative for chest pain.  Neurological:  Negative for dizziness, weakness and headaches.      Objective:     BP 118/68 (BP Location: Left Arm, Cuff Size: Normal)   Pulse 65   Temp (!) 96.5 F (35.8 C) (Axillary)   Wt 198 lb (89.8 kg)   LMP 01/24/2013   SpO2 95%   BMI 33.99 kg/m  BP Readings from Last 3 Encounters:  05/13/24 118/68  04/12/24 (!) 150/80  01/18/24 (!) 183/80   Wt Readings from Last 3 Encounters:  05/13/24 198 lb (89.8 kg)  04/12/24 203 lb 8 oz (92.3 kg)  02/10/23  219 lb (99.3 kg)      Physical Exam Vitals reviewed.  Constitutional:      Appearance: She is well-developed.  Neck:     Thyroid : No thyromegaly.     Vascular: No JVD.  Cardiovascular:     Rate and Rhythm: Normal rate and regular rhythm.     Heart sounds:     No gallop.  Pulmonary:     Effort: Pulmonary effort is normal. No respiratory distress.     Breath sounds: Normal breath sounds. No wheezing or rales.  Neurological:     Mental Status: She is alert.      No results found for any visits on 05/13/24.  Last CBC Lab Results  Component Value Date   WBC 7.2 01/18/2024   HGB 14.2 01/18/2024   HCT 42.2 01/18/2024   MCV 89.4 01/18/2024   MCH 30.1 01/18/2024   RDW 13.7 01/18/2024   PLT 246 01/18/2024   Last metabolic panel Lab Results  Component Value Date   GLUCOSE 99 04/12/2024   NA 139 04/12/2024   K 4.1 04/12/2024   CL 104 04/12/2024   CO2 27 04/12/2024   BUN 15 04/12/2024   CREATININE 0.71 04/12/2024   GFR 90.81 04/12/2024   CALCIUM  9.5 04/12/2024   PROT 6.9 04/12/2024   ALBUMIN 4.2 04/12/2024   BILITOT 0.5 04/12/2024   ALKPHOS 74 04/12/2024   AST 19 04/12/2024   ALT 17 04/12/2024   ANIONGAP 5 04/14/2023   Last lipids Lab Results  Component Value Date   CHOL 152 04/12/2024   HDL 47.70 04/12/2024   LDLCALC 90 04/12/2024   TRIG 68.0 04/12/2024   CHOLHDL 3 04/12/2024      The ASCVD Risk score (Arnett DK, et al., 2019) failed to calculate for the following reasons:   Risk score cannot be calculated because patient has a medical history suggesting prior/existing ASCVD    Assessment & Plan:   Hypertension.  Recent elevated reading but improved today.  We discussed the fact that her weight loss is probably helping with this as well.  Continue current dose of lisinopril .  Continue low-sodium diet.  Continue home monitoring.  Continue weight loss efforts  Wolm Scarlet, MD

## 2024-06-29 DIAGNOSIS — C719 Malignant neoplasm of brain, unspecified: Secondary | ICD-10-CM | POA: Diagnosis not present

## 2024-07-15 ENCOUNTER — Other Ambulatory Visit: Payer: Self-pay | Admitting: Family Medicine

## 2024-07-20 ENCOUNTER — Encounter: Payer: Self-pay | Admitting: Family Medicine

## 2024-07-20 ENCOUNTER — Ambulatory Visit: Admitting: Family Medicine

## 2024-07-20 VITALS — BP 152/90 | HR 84 | Temp 97.8°F | Wt 190.7 lb

## 2024-07-20 DIAGNOSIS — S90222A Contusion of left lesser toe(s) with damage to nail, initial encounter: Secondary | ICD-10-CM | POA: Diagnosis not present

## 2024-07-20 DIAGNOSIS — I10 Essential (primary) hypertension: Secondary | ICD-10-CM | POA: Diagnosis not present

## 2024-07-20 DIAGNOSIS — E785 Hyperlipidemia, unspecified: Secondary | ICD-10-CM

## 2024-07-20 LAB — COMPREHENSIVE METABOLIC PANEL WITH GFR
ALT: 27 U/L (ref 0–35)
AST: 23 U/L (ref 0–37)
Albumin: 4.3 g/dL (ref 3.5–5.2)
Alkaline Phosphatase: 81 U/L (ref 39–117)
BUN: 13 mg/dL (ref 6–23)
CO2: 24 meq/L (ref 19–32)
Calcium: 9.5 mg/dL (ref 8.4–10.5)
Chloride: 103 meq/L (ref 96–112)
Creatinine, Ser: 0.75 mg/dL (ref 0.40–1.20)
GFR: 84.87 mL/min (ref >60.00)
Glucose, Bld: 121 mg/dL — ABNORMAL HIGH (ref 70–99)
Potassium: 4.2 meq/L (ref 3.5–5.1)
Sodium: 138 meq/L (ref 135–145)
Total Bilirubin: 0.6 mg/dL (ref 0.2–1.2)
Total Protein: 7.2 g/dL (ref 6.0–8.3)

## 2024-07-20 LAB — HEMOGLOBIN A1C: Hgb A1c MFr Bld: 5.8 % (ref 4.6–6.5)

## 2024-07-20 LAB — LIPID PANEL
Cholesterol: 245 mg/dL — ABNORMAL HIGH (ref 0–200)
HDL: 49.9 mg/dL (ref >39.00)
LDL Cholesterol: 174 mg/dL — ABNORMAL HIGH (ref 0–99)
NonHDL: 195.16
Total CHOL/HDL Ratio: 5
Triglycerides: 105 mg/dL (ref 0.0–149.0)
VLDL: 21 mg/dL (ref 0.0–40.0)

## 2024-07-20 NOTE — Patient Instructions (Signed)
 Increase the Lisinopril  to 20 mg daily  Be in touch if BP consistently > 140/90.

## 2024-07-20 NOTE — Progress Notes (Signed)
 Established Patient Office Visit  Subjective   Patient ID: Kimberly Monroe, female    DOB: [birth year]  Age: 63 y.o. MRN: [id number]  Chief Complaint  Patient presents with   Toe Pain    HPI   Kimberly Monroe is seen today for several issues as follows  Initial chief complaint of toe pain .  However.  Not really having pain so much as some discoloration left great toe.  She noticed little bit of bruising near the base of the nail.  She realized her nails were longer than they should be and these were trimmed recently.  She has started walking more recently.  Does take Xarelto .  Not limiting walking.  She does have history of hypertension and have been taking lisinopril  10 mg daily but blood pressures sporadically elevated including today.  Denies any recent headaches or dizziness.  She has history of CVA and TIA in record but there is been apparently some dispute whether she had a true CVA.  She has history of astrocytoma frontal lobe and is felt she may have had some abnormal imaging related to that.  She had been on rosuvastatin  10 mg daily but quit taking this months ago.  She is requesting repeat lipids today.  She is fasting.  Does have past history of prediabetes range blood sugars going back to 2020.  Not monitoring recently.  She has been reading a book called good energy .  She has made several positive dietary changes with increasing healthy proteins and fats and reducing overall carbs.  Has lost good amount of weight including 9 pounds since last visit and feels much better overall.  Past Medical History:  Diagnosis Date   Allergy    seasonal   Anemia    Anxiety    Astrocytoma of frontal lobe (HCC) 11/02/2019   Basal cell cancer    Cognitive changes    Depression    Goals of care, counseling/discussion 11/02/2019   History of blood clotting disorder    Hx of cerebral artery stenosis    Hypertension    Melanoma (HCC) 2009   insitu   Menorrhagia 02/10/2013   Migraine     history of/none in years   Ovarian cancer (HCC) 1995   left ovary   Pulmonary embolism (HCC)    Squamous cell carcinoma    Stroke [facility])    Past Surgical History:  Procedure Laterality Date   ABDOMINAL HYSTERECTOMY  03/06/2013   BREAST DUCTAL SYSTEM EXCISION  2009   L intraductal papilloma   CEREBRAL ANGIOGRAM  2020   CESAREAN SECTION  1996, 2000   twins, singleton   IR ANGIO INTRA EXTRACRAN SEL COM CAROTID INNOMINATE BILAT MOD SED  11/05/2018   IR ANGIO VERTEBRAL SEL SUBCLAVIAN INNOMINATE UNI L MOD SED  11/05/2018   IR ANGIO VERTEBRAL SEL VERTEBRAL UNI R MOD SED  11/05/2018   IR US  GUIDE VASC ACCESS RIGHT  11/05/2018   laparoscopy with ovarian cystectomy  1994   ovarian torsion   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 She has never used smokeless tobacco. She reports current alcohol use. She reports that she does not use drugs. family history includes Breast cancer (age of onset: 80) in her mother; Cancer in her paternal uncle; Cancer (age of onset: 85) in her father; Colon cancer in her father; Stroke (age of onset: 16) in her sister.  Allergies  Allergen Reactions   Lime Flavor [Flavoring Agent (Non-Screening)] Anaphylaxis   Amoxicillin Other (See Comments)    Patient stated,my mom told me to never take it. I don't even know what type of reaction.   Penicillins Other (See Comments)    Did it involve swelling of the face/tongue/throat, SOB, or low BP? Unknown Did it involve sudden or severe rash/hives, skin peeling, or any reaction on the inside of your mouth or nose? Unknown Did you need to seek medical attention at a hospital or doctor's office? Unknown When did it last happen? Childhood allergy       If all above answers are "NO", may proceed with cephalosporin use.   Patient stated,I was told not to take it.    Sertraline  Other (See Comments)    Review of Systems  Constitutional:  Negative for  malaise/fatigue.  Eyes:  Negative for blurred vision.  Respiratory:  Negative for shortness of breath.   Cardiovascular:  Negative for chest pain.  Neurological:  Negative for dizziness, weakness and headaches.      Objective:     BP (!) 152/90 (BP Location: Left Arm, Cuff Size: Normal)   Pulse 84   Temp 97.8 F (36.6 C) (Oral)   Wt 190 lb 11.2 oz (86.5 kg)   LMP 01/24/2013   SpO2 96%   BMI 32.73 kg/m  BP Readings from Last 3 Encounters:  07/20/24 (!) 152/90  05/13/24 118/68  04/12/24 (!) 150/80   Wt Readings from Last 3 Encounters:  07/20/24 190 lb 11.2 oz (86.5 kg)  05/13/24 198 lb (89.8 kg)  04/12/24 203 lb 8 oz (92.3 kg)      Physical Exam Vitals reviewed.  Constitutional:      General: She is not in acute distress.    Appearance: She is not ill-appearing.  Cardiovascular:     Rate and Rhythm: Normal rate and regular rhythm.  Pulmonary:     Effort: Pulmonary effort is normal.     Breath sounds: Normal breath sounds. No wheezing or rales.  Musculoskeletal:     Right lower leg: No edema.     Left lower leg: No edema.     Comments: She has some mild bruising at the base of the left great toenail.  No significant fluid accumulation or fluctuance.  No erythema.  Neurological:     Mental Status: She is alert.      No results found for any visits on 07/20/24.  Last CBC Lab Results  Component Value Date   WBC 7.2 01/18/2024   HGB 14.2 01/18/2024   HCT 42.2 01/18/2024   MCV 89.4 01/18/2024   MCH 30.1 01/18/2024   RDW 13.7 01/18/2024   PLT 246 01/18/2024   Last metabolic panel Lab Results  Component Value Date   GLUCOSE 99 04/12/2024   NA 139 04/12/2024   K 4.1 04/12/2024   CL 104 04/12/2024   CO2 27 04/12/2024   BUN 15 04/12/2024   CREATININE 0.71 04/12/2024   GFR 90.81 04/12/2024   CALCIUM  9.5 04/12/2024   PROT 6.9 04/12/2024   ALBUMIN 4.2 04/12/2024   BILITOT 0.5 04/12/2024   ALKPHOS 74 04/12/2024   AST 19 04/12/2024   ALT 17 04/12/2024    ANIONGAP 5 04/14/2023   Last lipids Lab Results  Component Value Date   CHOL 152 04/12/2024   HDL 47.70 04/12/2024   LDLCALC 90 04/12/2024   TRIG 68.0 04/12/2024   CHOLHDL 3 04/12/2024      The  ASCVD Risk score (Arnett DK, et al., 2019) failed to calculate for the following reasons:   Risk score cannot be calculated because patient has a medical history suggesting prior/existing ASCVD    Assessment & Plan:   #1 subungual ecchymosis left great toenail.  Patient has been doing more walking recently and at 1 point had some longer nails which may have contributed.  She is also on Xarelto .  We do not see anything to suggest melanoma or more worsening finding.  This should grow out with time  #2 hypertension.  Initial blood pressure up today and did come down some with rest and also had standing reading of 138/90.  Increase lisinopril  to 20 mg.  She had been taking 10 mg.  Recommend close home monitoring and be in touch if consistently greater than 140 systolic or 90 diastolic.  Continue healthy lifestyle changes  #3 hyperlipidemia.  Patient had been on rosuvastatin .  We had been aiming for LDL less than 70 but apparently there is been some real question whether she had prior stroke versus radiographic changes related to her astrocytoma patient requesting repeat fasting lipid today No follow-ups on file.    Wolm Scarlet, MD

## 2024-07-21 ENCOUNTER — Ambulatory Visit: Payer: Self-pay | Admitting: Family Medicine

## 2024-08-12 DIAGNOSIS — R9082 White matter disease, unspecified: Secondary | ICD-10-CM | POA: Diagnosis not present

## 2024-08-12 DIAGNOSIS — Z79899 Other long term (current) drug therapy: Secondary | ICD-10-CM | POA: Diagnosis not present

## 2024-08-12 DIAGNOSIS — R4701 Aphasia: Secondary | ICD-10-CM | POA: Diagnosis not present

## 2024-08-12 DIAGNOSIS — D6851 Activated protein C resistance: Secondary | ICD-10-CM | POA: Diagnosis not present

## 2024-08-12 DIAGNOSIS — I10 Essential (primary) hypertension: Secondary | ICD-10-CM | POA: Diagnosis not present

## 2024-08-12 DIAGNOSIS — G459 Transient cerebral ischemic attack, unspecified: Secondary | ICD-10-CM | POA: Diagnosis not present

## 2024-08-12 DIAGNOSIS — Z48811 Encounter for surgical aftercare following surgery on the nervous system: Secondary | ICD-10-CM | POA: Diagnosis not present

## 2024-08-12 DIAGNOSIS — R531 Weakness: Secondary | ICD-10-CM | POA: Diagnosis not present

## 2024-08-12 DIAGNOSIS — Z85841 Personal history of malignant neoplasm of brain: Secondary | ICD-10-CM | POA: Diagnosis not present

## 2024-08-12 DIAGNOSIS — F801 Expressive language disorder: Secondary | ICD-10-CM | POA: Diagnosis not present

## 2024-08-12 DIAGNOSIS — R200 Anesthesia of skin: Secondary | ICD-10-CM | POA: Diagnosis not present

## 2024-08-12 DIAGNOSIS — Z7902 Long term (current) use of antithrombotics/antiplatelets: Secondary | ICD-10-CM | POA: Diagnosis not present

## 2024-08-12 DIAGNOSIS — Z923 Personal history of irradiation: Secondary | ICD-10-CM | POA: Diagnosis not present

## 2024-08-12 DIAGNOSIS — G319 Degenerative disease of nervous system, unspecified: Secondary | ICD-10-CM | POA: Diagnosis not present

## 2024-08-13 DIAGNOSIS — R569 Unspecified convulsions: Secondary | ICD-10-CM | POA: Diagnosis not present

## 2024-08-13 DIAGNOSIS — Z48811 Encounter for surgical aftercare following surgery on the nervous system: Secondary | ICD-10-CM | POA: Diagnosis not present

## 2024-08-13 DIAGNOSIS — D6851 Activated protein C resistance: Secondary | ICD-10-CM | POA: Diagnosis not present

## 2024-08-13 DIAGNOSIS — G459 Transient cerebral ischemic attack, unspecified: Secondary | ICD-10-CM | POA: Diagnosis not present

## 2024-08-13 DIAGNOSIS — R9082 White matter disease, unspecified: Secondary | ICD-10-CM | POA: Diagnosis not present

## 2024-08-13 DIAGNOSIS — G319 Degenerative disease of nervous system, unspecified: Secondary | ICD-10-CM | POA: Diagnosis not present

## 2024-08-13 DIAGNOSIS — R2990 Unspecified symptoms and signs involving the nervous system: Secondary | ICD-10-CM | POA: Diagnosis not present

## 2024-08-13 DIAGNOSIS — I10 Essential (primary) hypertension: Secondary | ICD-10-CM | POA: Diagnosis not present

## 2024-08-23 DIAGNOSIS — C719 Malignant neoplasm of brain, unspecified: Secondary | ICD-10-CM | POA: Diagnosis not present

## 2024-08-23 DIAGNOSIS — Z1509 Genetic susceptibility to other malignant neoplasm: Secondary | ICD-10-CM | POA: Diagnosis not present

## 2024-08-23 DIAGNOSIS — G40209 Localization-related (focal) (partial) symptomatic epilepsy and epileptic syndromes with complex partial seizures, not intractable, without status epilepticus: Secondary | ICD-10-CM | POA: Diagnosis not present

## 2024-09-20 ENCOUNTER — Ambulatory Visit: Admitting: Family Medicine

## 2024-09-22 ENCOUNTER — Other Ambulatory Visit: Payer: Self-pay | Admitting: Hematology & Oncology

## 2024-09-22 DIAGNOSIS — E559 Vitamin D deficiency, unspecified: Secondary | ICD-10-CM

## 2024-10-08 ENCOUNTER — Other Ambulatory Visit: Payer: Self-pay | Admitting: Family Medicine

## 2024-10-11 ENCOUNTER — Other Ambulatory Visit: Payer: Self-pay | Admitting: Hematology & Oncology

## 2024-10-11 DIAGNOSIS — Z86711 Personal history of pulmonary embolism: Secondary | ICD-10-CM

## 2024-10-11 DIAGNOSIS — I639 Cerebral infarction, unspecified: Secondary | ICD-10-CM

## 2024-11-22 ENCOUNTER — Inpatient Hospital Stay

## 2024-11-22 ENCOUNTER — Inpatient Hospital Stay: Admitting: Medical Oncology
